# Patient Record
Sex: Male | Born: 1940
Health system: Southern US, Community
[De-identification: ages and names within clinical notes are randomized; demographics above are authoritative.]

## PROBLEM LIST (undated history)

## (undated) DIAGNOSIS — I272 Pulmonary hypertension, unspecified: Secondary | ICD-10-CM

## (undated) DIAGNOSIS — Z86718 Personal history of other venous thrombosis and embolism: Secondary | ICD-10-CM

## (undated) DIAGNOSIS — G8929 Other chronic pain: Secondary | ICD-10-CM

## (undated) DIAGNOSIS — R0609 Other forms of dyspnea: Secondary | ICD-10-CM

## (undated) DIAGNOSIS — M1990 Unspecified osteoarthritis, unspecified site: Secondary | ICD-10-CM

## (undated) DIAGNOSIS — Z8679 Personal history of other diseases of the circulatory system: Secondary | ICD-10-CM

## (undated) DIAGNOSIS — I452 Bifascicular block: Secondary | ICD-10-CM

## (undated) DIAGNOSIS — Z86711 Personal history of pulmonary embolism: Secondary | ICD-10-CM

## (undated) DIAGNOSIS — Z860101 Personal history of adenomatous and serrated colon polyps: Secondary | ICD-10-CM

## (undated) DIAGNOSIS — K2270 Barrett's esophagus without dysplasia: Secondary | ICD-10-CM

## (undated) DIAGNOSIS — K5790 Diverticulosis of intestine, part unspecified, without perforation or abscess without bleeding: Secondary | ICD-10-CM

## (undated) DIAGNOSIS — K219 Gastro-esophageal reflux disease without esophagitis: Secondary | ICD-10-CM

## (undated) DIAGNOSIS — Z7901 Long term (current) use of anticoagulants: Secondary | ICD-10-CM

## (undated) DIAGNOSIS — R600 Localized edema: Secondary | ICD-10-CM

## (undated) DIAGNOSIS — I10 Essential (primary) hypertension: Secondary | ICD-10-CM

## (undated) DIAGNOSIS — M545 Low back pain, unspecified: Secondary | ICD-10-CM

## (undated) DIAGNOSIS — I723 Aneurysm of iliac artery: Secondary | ICD-10-CM

## (undated) DIAGNOSIS — Z8371 Family history of colonic polyps: Secondary | ICD-10-CM

## (undated) DIAGNOSIS — M109 Gout, unspecified: Secondary | ICD-10-CM

## (undated) DIAGNOSIS — C61 Malignant neoplasm of prostate: Secondary | ICD-10-CM

## (undated) DIAGNOSIS — Z923 Personal history of irradiation: Secondary | ICD-10-CM

## (undated) DIAGNOSIS — R0600 Dyspnea, unspecified: Secondary | ICD-10-CM

## (undated) DIAGNOSIS — I2510 Atherosclerotic heart disease of native coronary artery without angina pectoris: Secondary | ICD-10-CM

## (undated) DIAGNOSIS — Z86010 Personal history of colonic polyps: Secondary | ICD-10-CM

## (undated) DIAGNOSIS — Z83719 Family history of colon polyps, unspecified: Secondary | ICD-10-CM

## (undated) DIAGNOSIS — I714 Abdominal aortic aneurysm, without rupture, unspecified: Secondary | ICD-10-CM

## (undated) DIAGNOSIS — Z8719 Personal history of other diseases of the digestive system: Secondary | ICD-10-CM

## (undated) DIAGNOSIS — N329 Bladder disorder, unspecified: Secondary | ICD-10-CM

## (undated) HISTORY — DX: Abdominal aortic aneurysm, without rupture: I71.4

## (undated) HISTORY — DX: Atherosclerotic heart disease of native coronary artery without angina pectoris: I25.10

## (undated) HISTORY — DX: Essential (primary) hypertension: I10

## (undated) HISTORY — PX: TONSILLECTOMY: SUR1361

## (undated) HISTORY — DX: Abdominal aortic aneurysm, without rupture, unspecified: I71.40

## (undated) HISTORY — PX: ORIF FEMUR FRACTURE: SHX2119

## (undated) HISTORY — DX: Diverticulosis of intestine, part unspecified, without perforation or abscess without bleeding: K57.90

## (undated) HISTORY — DX: Malignant neoplasm of prostate: C61

## (undated) HISTORY — DX: Personal history of irradiation: Z92.3

---

## 2003-08-12 ENCOUNTER — Inpatient Hospital Stay (HOSPITAL_COMMUNITY): Admission: EM | Admit: 2003-08-12 | Discharge: 2003-08-17 | Payer: Self-pay | Admitting: Emergency Medicine

## 2003-08-17 ENCOUNTER — Encounter: Payer: Self-pay | Admitting: Cardiology

## 2004-11-13 ENCOUNTER — Encounter (INDEPENDENT_AMBULATORY_CARE_PROVIDER_SITE_OTHER): Payer: Self-pay | Admitting: *Deleted

## 2004-11-13 ENCOUNTER — Ambulatory Visit (HOSPITAL_COMMUNITY): Admission: RE | Admit: 2004-11-13 | Discharge: 2004-11-13 | Payer: Self-pay | Admitting: *Deleted

## 2005-12-19 ENCOUNTER — Ambulatory Visit: Payer: Self-pay | Admitting: Internal Medicine

## 2006-01-06 ENCOUNTER — Ambulatory Visit: Payer: Self-pay | Admitting: Internal Medicine

## 2006-01-06 ENCOUNTER — Encounter (INDEPENDENT_AMBULATORY_CARE_PROVIDER_SITE_OTHER): Payer: Self-pay | Admitting: Specialist

## 2007-06-25 DIAGNOSIS — K2270 Barrett's esophagus without dysplasia: Secondary | ICD-10-CM

## 2007-06-25 HISTORY — DX: Barrett's esophagus without dysplasia: K22.70

## 2007-10-27 ENCOUNTER — Encounter: Payer: Self-pay | Admitting: Internal Medicine

## 2007-10-27 ENCOUNTER — Emergency Department (HOSPITAL_COMMUNITY): Admission: EM | Admit: 2007-10-27 | Discharge: 2007-10-27 | Payer: Self-pay | Admitting: Emergency Medicine

## 2007-10-30 ENCOUNTER — Ambulatory Visit: Payer: Self-pay | Admitting: Internal Medicine

## 2007-11-25 DIAGNOSIS — C189 Malignant neoplasm of colon, unspecified: Secondary | ICD-10-CM

## 2007-11-25 DIAGNOSIS — Z860100 Personal history of colon polyps, unspecified: Secondary | ICD-10-CM | POA: Insufficient documentation

## 2007-11-25 DIAGNOSIS — T18108A Unspecified foreign body in esophagus causing other injury, initial encounter: Secondary | ICD-10-CM

## 2007-11-25 DIAGNOSIS — M109 Gout, unspecified: Secondary | ICD-10-CM

## 2007-11-25 DIAGNOSIS — K2270 Barrett's esophagus without dysplasia: Secondary | ICD-10-CM

## 2007-11-25 DIAGNOSIS — K5730 Diverticulosis of large intestine without perforation or abscess without bleeding: Secondary | ICD-10-CM | POA: Insufficient documentation

## 2007-11-25 DIAGNOSIS — K2980 Duodenitis without bleeding: Secondary | ICD-10-CM | POA: Insufficient documentation

## 2007-11-25 DIAGNOSIS — R1013 Epigastric pain: Secondary | ICD-10-CM

## 2007-11-25 DIAGNOSIS — K222 Esophageal obstruction: Secondary | ICD-10-CM

## 2007-11-26 ENCOUNTER — Ambulatory Visit: Payer: Self-pay | Admitting: Internal Medicine

## 2007-11-26 DIAGNOSIS — K219 Gastro-esophageal reflux disease without esophagitis: Secondary | ICD-10-CM

## 2008-01-08 ENCOUNTER — Ambulatory Visit: Payer: Self-pay | Admitting: Internal Medicine

## 2008-01-08 ENCOUNTER — Encounter: Payer: Self-pay | Admitting: Internal Medicine

## 2008-01-11 ENCOUNTER — Encounter: Payer: Self-pay | Admitting: Internal Medicine

## 2008-05-15 ENCOUNTER — Observation Stay (HOSPITAL_COMMUNITY): Admission: EM | Admit: 2008-05-15 | Discharge: 2008-05-16 | Payer: Self-pay | Admitting: Emergency Medicine

## 2008-05-15 ENCOUNTER — Encounter (INDEPENDENT_AMBULATORY_CARE_PROVIDER_SITE_OTHER): Payer: Self-pay | Admitting: *Deleted

## 2008-05-16 ENCOUNTER — Ambulatory Visit: Payer: Self-pay | Admitting: Gastroenterology

## 2008-06-15 ENCOUNTER — Ambulatory Visit: Payer: Self-pay | Admitting: Internal Medicine

## 2009-01-15 ENCOUNTER — Ambulatory Visit (HOSPITAL_COMMUNITY): Admission: EM | Admit: 2009-01-15 | Discharge: 2009-01-15 | Payer: Self-pay | Admitting: Emergency Medicine

## 2009-01-15 ENCOUNTER — Ambulatory Visit: Payer: Self-pay | Admitting: Gastroenterology

## 2009-06-05 ENCOUNTER — Encounter: Payer: Self-pay | Admitting: Internal Medicine

## 2010-01-09 ENCOUNTER — Encounter: Payer: Self-pay | Admitting: Internal Medicine

## 2010-04-30 ENCOUNTER — Telehealth: Payer: Self-pay | Admitting: Internal Medicine

## 2010-04-30 ENCOUNTER — Encounter: Payer: Self-pay | Admitting: Internal Medicine

## 2010-05-04 ENCOUNTER — Encounter (INDEPENDENT_AMBULATORY_CARE_PROVIDER_SITE_OTHER): Payer: Self-pay

## 2010-05-08 ENCOUNTER — Ambulatory Visit: Payer: Self-pay | Admitting: Internal Medicine

## 2010-05-25 ENCOUNTER — Ambulatory Visit: Payer: Self-pay | Admitting: Internal Medicine

## 2010-05-29 ENCOUNTER — Encounter: Payer: Self-pay | Admitting: Internal Medicine

## 2010-06-26 ENCOUNTER — Encounter: Payer: Self-pay | Admitting: Internal Medicine

## 2010-07-24 NOTE — Letter (Signed)
Summary: Previsit letter  [facility]  [address] [place], [place] [address]   Phone: [phone]  Fax: [phone]       04/30/2010 MRN: [id number]  [facility] [address] [place], [place]  [address]  Dear [name],  Welcome to the Gastroenterology Division at [facility].    You are scheduled to see a nurse for your pre-procedure visit on 05/08/10 at 2:30 PM on the 3rd floor at [facility], [address]  We ask that you try to arrive at our office 15 minutes prior to your appointment time to allow for check-in.  Your nurse visit will consist of discussing your medical and surgical history, your immediate family medical history, and your medications.    Please bring a complete list of all your medications or, if you prefer, bring the medication bottles and we will list them.  We will need to be aware of both prescribed and over the counter drugs.  We will need to know exact dosage information as well.  If you are on blood thinners (Coumadin, Plavix, Aggrenox, Ticlid, etc.) please call our office today/prior to your appointment, as we need to consult with your physician about holding your medication.   Please be prepared to read and sign documents such as consent forms, a financial agreement, and acknowledgement forms.  If necessary, and with your consent, a friend or relative is welcome to sit-in on the nurse visit with you.  Please bring your insurance card so that we may make a copy of it.  If your insurance requires a referral to see a specialist, please bring your referral form from your primary care physician.  No co-pay is required for this nurse visit.     If you cannot keep your appointment, please call [phone] to cancel or reschedule prior to your appointment date.  This allows [place] the opportunity to schedule an appointment for another patient in need of care.    Thank you for choosing  Gastroenterology for your  medical needs.  We appreciate the opportunity to care for you.  Please visit [place] at our website  to learn more about our practice.                     Sincerely.                                                                                                                   The Gastroenterology Division

## 2010-07-24 NOTE — Letter (Signed)
Summary: EGD Instructions  [facility]  [address]   [place], [place] [address]   Phone: [phone]  Fax: [phone]       [name]    30-Mar-1941    MRN: [id number]       Procedure Day /Date: Friday, 05-25-10     Arrival Time: 2:30 p.m     Procedure Time: 3:30 p.m.     Location of Procedure:                    x  [facility] (4th Floor)    PREPARATION FOR ENDOSCOPY   On 05-25-10  THE DAY OF THE PROCEDURE:  1.   No solid foods, milk or milk products are allowed after midnight the night before your procedure.  2.   Do not drink anything colored red or purple.  Avoid juices with pulp.  No orange juice.  3.  You may drink clear liquids until 1:30 p.m., which is 2 hours before your procedure.                                                                                                CLEAR LIQUIDS INCLUDE: Water Jello Ice Popsicles Tea (sugar ok, no milk/cream) Powdered fruit flavored drinks Coffee (sugar ok, no milk/cream) Gatorade Juice: apple, white grape, white cranberry  Lemonade Clear bullion, consomm, broth Carbonated beverages (any kind) Strained chicken noodle soup Hard Candy   MEDICATION INSTRUCTIONS  Unless otherwise instructed, you should take regular prescription medications with a small sip of water as early as possible the morning of your procedure.        OTHER INSTRUCTIONS  You will need a responsible adult at least 71 years of age to accompany you and drive you home.   This person must remain in the waiting room during your procedure.  Wear loose fitting clothing that is easily removed.  Leave jewelry and other valuables at home.  However, you may wish to bring a book to read or an iPod/MP3 player to listen to music as you wait for your procedure to start.  Remove all body piercing jewelry and leave at home.  Total time from sign-in until discharge is approximately 2-3 hours.  You should go home  directly after your procedure and rest.  You can resume normal activities the day after your procedure.  The day of your procedure you should not:   Drive   Make legal decisions   Operate machinery   Drink alcohol   Return to work  You will receive specific instructions about eating, activities and medications before you leave.    The above instructions have been reviewed and explained to me by   Ulis Rias RN  May 08, 2010 3:18 PM     I fully understand and can verbalize these instructions _____________________________ Date _________

## 2010-07-24 NOTE — Miscellaneous (Signed)
Summary: Lec previsit  Clinical Lists Changes  Observations: Added new observation of NKA: T (05/08/2010 14:34)

## 2010-07-24 NOTE — Letter (Signed)
Summary: Patient Notice-[facility]rett[facility]  [address] [place], [place] [address]   Phone: [phone]  Fax: [phone]        May 29, 2010 MRN: [id number]    [facility] [address] [place], [place]  [address]    Dear Mr. Bontrager,  I am pleased to inform you that the biopsies taken during your recent endoscopic examination did not show any evidence of cancer upon pathologic examination.  However, your biopsies indicate you have a condition known as Barrett's esophagus. While not cancer, it is pre-cancerous (can progress to cancer) and needs to be monitored with repeat endoscopic examination and biopsies.  Fortunately, it is quite rare that this develops into cancer, but careful monitoring of the condition along with taking your medication as prescribed is important in reducing the risk of developing cancer.  It is my recommendation that you have a repeat upper gastrointestinal endoscopic examination in _3 years when You also will be due for colonoscopy.  Additional information/recommendations:  __Please call [phone] to schedule a return visit to further      evaluate your condition.  _x_Continue with treatment plan as outlined the day of your exam.  Please call us if you have or develop heartburn, reflux symptoms, any swallowing problems, or if you have questions about your condition that have not been fully answered at this time.  Sincerely,  Hart Carwin MD  This letter has been electronically signed by your physician.  Appended Document: Patient Notice-Barrett's Esopghagus Letter mailed

## 2010-07-24 NOTE — Miscellaneous (Signed)
Summary: Omeprazole Rx  Clinical Lists Changes  Medications: Changed medication from OMEPRAZOLE 20 MG CPDR (OMEPRAZOLE) 1 by mouth once daily. to OMEPRAZOLE 20 MG CPDR (OMEPRAZOLE) Take 1 tablet by mouth once a day.   MUST HAVE OFFICE VISIT FOR FURTHER REFILLS! - Signed Rx of OMEPRAZOLE 20 MG CPDR (OMEPRAZOLE) Take 1 tablet by mouth once a day.   MUST HAVE OFFICE VISIT FOR FURTHER REFILLS!;  #90 x 0;  Signed;  Entered by: Lamona Curl CMA (AAMA);  Authorized by: Hart Carwin MD;  Method used: Faxed to [facility], [address], [place], [place]  [id number], Ph: [phone], Fax: [phone]    Prescriptions: OMEPRAZOLE 20 MG CPDR (OMEPRAZOLE) Take 1 tablet by mouth once a day.   MUST HAVE OFFICE VISIT FOR FURTHER REFILLS!  #90 x 0   Entered by:   Lamona Curl CMA (AAMA)   Authorized by:   Hart Carwin MD   Signed by:   Lamona Curl CMA (AAMA) on 01/09/2010   Method used:   Faxed to ...       [facility] (mail-order)       [address]       [place], [place]  [id number]       Ph: [phone]       Fax: [phone]   RxID:   [id number]

## 2010-07-24 NOTE — Procedures (Signed)
Summary: Upper Endoscopy  Patient: [name] Note: All result statuses are Final unless otherwise noted.  Tests: (1) Upper Endoscopy (EGD)   EGD Upper Endoscopy       DONE     [facility]     [address]     [place], [place]  [address]           ENDOSCOPY PROCEDURE REPORT           PATIENT:  [name], [name]  MR#:  [id number]     BIRTHDATE:  11-Oct-1940, 69 yrs. old  GENDER:  male           ENDOSCOPIST:  Hedwig Morton. Juanda Chance, MD     Referred by:  Soyla Murphy. Renne Crigler, M.D.           PROCEDURE DATE:  05/25/2010     PROCEDURE:  EGD with biopsy, [address]     ASA CLASS:  Class II     INDICATIONS:  h/o Barrett's Esophagus last EGD 12/2007, hx of fish     bone impaction 2009,           MEDICATIONS:   Versed 10 mg, Fentanyl 100 mcg     TOPICAL ANESTHETIC:  Exactacain Spray           DESCRIPTION OF PROCEDURE:   After the risks benefits and     alternatives of the procedure were thoroughly explained, informed     consent was obtained.  The LB GIF-H180 [id number] endoscope was     introduced through the mouth and advanced to the second portion of     the duodenum, without limitations.  The instrument was slowly     withdrawn as the mucosa was fully examined.     <<PROCEDUREIMAGES>>           Barrett's esophagus was found. 2-3 cm Barrett's esophagus 35-37 cm     Multiple biopsies were obtained and sent to pathology (see     image6).  A hiatal hernia was found (see image5, image4, and     image2). 3 cm hiatal hernia  Otherwise the examination was normal.     Retroflexed views revealed no abnormalities.    The scope was then     withdrawn from the patient and the procedure completed.           COMPLICATIONS:  None           ENDOSCOPIC IMPRESSION:     1) Barrett's esophagus     2) Hiatal hernia     3) Otherwise normal examination     RECOMMENDATIONS:     1) Await biopsy results     2) Anti-reflux regimen to be follow     continue Omeprazole 20 mg daily           REPEAT EXAM:  In 2  year(s) for.           ______________________________     Hedwig Morton. Juanda Chance, MD           CC:           n.     eSIGNED:   Hedwig Morton. Brodie at 05/25/2010 04:45 PM           [name], [id number]  Note: An exclamation mark (!) indicates a result that was not dispersed into the flowsheet. Document Creation Date: 05/25/2010 4:45 PM _______________________________________________________________________  (1) Order result status: Final Collection or observation date-time: 05/25/2010 16:30 Requested date-time:  Receipt date-time:  Reported  date-time:  Referring Physician:   Ordering Physician: Lina Sar [phone]) Specimen Source:  Source: Launa Grill Order Number: [id number] Lab site:   Appended Document: Upper Endoscopy 3 yrs     Procedures Next Due Date:    EGD: 05/2013

## 2010-07-24 NOTE — Progress Notes (Signed)
Summary: EGD?  Phone Note Call from Patient Call back at Home Phone [phone]   Caller: wife Call For: Dr. Juanda Chance Reason for Call: Talk to Nurse Summary of Call: wife thinks pt is due for another EGD now but nothing indicates this in EMR or IDX... no problems per wife Initial call taken by: Vallarie Mare,  April 30, 2010 9:39 AM  Follow-up for Phone Call        Patient's wife called to see if patient needs EGD scheduled. Patient had EGD 12/2007 with a note to have another in 2 years. Patient had EGD on 01/15/09 to have foreign body removed. Please, advise. Follow-up by: Jesse Fall RN,  April 30, 2010 10:56 AM  Additional Follow-up for Phone Call Additional follow up Details #1::        Yes. He is a Barrett's esophagus pt. He is due for EGD/Bx fot Barrett's follow up. Additional Follow-up by: Hart Carwin MD,  April 30, 2010 3:43 PM    Additional Follow-up for Phone Call Additional follow up Details #2::    Patient is scheduled for EGD for barrett's esophagus with bx on 05/15/10 @ 2 PM with Dr. Juanda Chance at [facility]. Patient is scheduled for previsit on 05/08/10 @ 2:30 PM. Patient's wife also wants to get Omeprazole rx sent to [facility] at [phone]. Must send his birthdate and [place] # [id number] per wife.  Prescriptions: OMEPRAZOLE 20 MG CPDR (OMEPRAZOLE) Take 1 tablet by mouth once a day.   MUST HAVE OFFICE VISIT FOR FURTHER REFILLS!  #90 x 0   Entered by:   Jesse Fall RN   Authorized by:   Hart Carwin MD   Signed by:   Jesse Fall RN on 04/30/2010   Method used:   Printed then faxed to ...       [facility] (mail-order)       [address]       [place], [place]  [id number]       Ph: [phone]       Fax: [phone]   RxID:   [id number]

## 2010-07-26 NOTE — Miscellaneous (Signed)
Summary: Omeprazole refill  Clinical Lists Changes  Medications: Changed medication from OMEPRAZOLE 20 MG CPDR (OMEPRAZOLE) Take 1 tablet by mouth once a day.   MUST HAVE OFFICE VISIT FOR FURTHER REFILLS! to OMEPRAZOLE 20 MG CPDR (OMEPRAZOLE) Take 1 tablet by mouth once a day. - Signed Rx of OMEPRAZOLE 20 MG CPDR (OMEPRAZOLE) Take 1 tablet by mouth once a day.;  #90 x 3;  Signed;  Entered by: Christie Nottingham CMA (AAMA);  Authorized by: Hart Carwin MD;  Method used: Faxed to [facility], [address], [place], [place]  [id number], Ph: [phone], Fax: [phone]    Prescriptions: OMEPRAZOLE 20 MG CPDR (OMEPRAZOLE) Take 1 tablet by mouth once a day.  #90 x 3   Entered by:   Christie Nottingham CMA (AAMA)   Authorized by:   Hart Carwin MD   Signed by:   Christie Nottingham CMA (AAMA) on 06/26/2010   Method used:   Faxed to ...       [facility] (mail-order)       [address]       [place], [place]  [id number]       Ph: [phone]       Fax: [phone]   RxID:   [id number]

## 2010-10-02 ENCOUNTER — Emergency Department (HOSPITAL_COMMUNITY): Payer: Medicare HMO

## 2010-10-02 ENCOUNTER — Encounter (HOSPITAL_COMMUNITY): Payer: Self-pay

## 2010-10-02 ENCOUNTER — Inpatient Hospital Stay (HOSPITAL_COMMUNITY)
Admission: EM | Admit: 2010-10-02 | Discharge: 2010-10-09 | DRG: 482 | Disposition: A | Discharge status: Skilled Nursing Facility | Payer: Medicare HMO | Attending: Orthopedic Surgery | Admitting: Orthopedic Surgery

## 2010-10-02 DIAGNOSIS — S0003XA Contusion of scalp, initial encounter: Secondary | ICD-10-CM | POA: Diagnosis present

## 2010-10-02 DIAGNOSIS — Y9289 Other specified places as the place of occurrence of the external cause: Secondary | ICD-10-CM

## 2010-10-02 DIAGNOSIS — K5730 Diverticulosis of large intestine without perforation or abscess without bleeding: Secondary | ICD-10-CM | POA: Diagnosis present

## 2010-10-02 DIAGNOSIS — Z86711 Personal history of pulmonary embolism: Secondary | ICD-10-CM

## 2010-10-02 DIAGNOSIS — K2270 Barrett's esophagus without dysplasia: Secondary | ICD-10-CM | POA: Diagnosis present

## 2010-10-02 DIAGNOSIS — S1093XA Contusion of unspecified part of neck, initial encounter: Secondary | ICD-10-CM | POA: Diagnosis present

## 2010-10-02 DIAGNOSIS — S7223XA Displaced subtrochanteric fracture of unspecified femur, initial encounter for closed fracture: Principal | ICD-10-CM | POA: Diagnosis present

## 2010-10-02 DIAGNOSIS — IMO0002 Reserved for concepts with insufficient information to code with codable children: Secondary | ICD-10-CM | POA: Diagnosis present

## 2010-10-02 LAB — BASIC METABOLIC PANEL
BUN: 22 mg/dL (ref 6–23)
CO2: 25 mEq/L (ref 19–32)
Calcium: 8.6 mg/dL (ref 8.4–10.5)
Creatinine, Ser: 0.91 mg/dL (ref 0.4–1.5)
Glucose, Bld: 141 mg/dL — ABNORMAL HIGH (ref 70–99)

## 2010-10-02 LAB — CBC
HCT: 41.3 % (ref 39.0–52.0)
Platelets: 138 10*3/uL — ABNORMAL LOW (ref 150–400)
RDW: 13 % (ref 11.5–15.5)
WBC: 9.5 10*3/uL (ref 4.0–10.5)

## 2010-10-02 LAB — DIFFERENTIAL
Basophils Absolute: 0 10*3/uL (ref 0.0–0.1)
Eosinophils Relative: 1 % (ref 0–5)
Lymphocytes Relative: 7 % — ABNORMAL LOW (ref 12–46)
Neutrophils Relative %: 86 % — ABNORMAL HIGH (ref 43–77)

## 2010-10-02 LAB — URINALYSIS, ROUTINE W REFLEX MICROSCOPIC
Bilirubin Urine: NEGATIVE
Hgb urine dipstick: NEGATIVE
Protein, ur: NEGATIVE mg/dL
Urobilinogen, UA: 1 mg/dL (ref 0.0–1.0)

## 2010-10-03 LAB — BASIC METABOLIC PANEL
BUN: 17 mg/dL (ref 6–23)
CO2: 26 mEq/L (ref 19–32)
Chloride: 106 mEq/L (ref 96–112)
Creatinine, Ser: 0.75 mg/dL (ref 0.4–1.5)
Glucose, Bld: 130 mg/dL — ABNORMAL HIGH (ref 70–99)
Potassium: 4 mEq/L (ref 3.5–5.1)

## 2010-10-03 LAB — CBC
HCT: 30.5 % — ABNORMAL LOW (ref 39.0–52.0)
MCH: 32.1 pg (ref 26.0–34.0)
MCHC: 34.1 g/dL (ref 30.0–36.0)
MCV: 94.1 fL (ref 78.0–100.0)
Platelets: 133 10*3/uL — ABNORMAL LOW (ref 150–400)
RDW: 13.1 % (ref 11.5–15.5)

## 2010-10-04 LAB — BASIC METABOLIC PANEL
CO2: 27 mEq/L (ref 19–32)
Calcium: 7.1 mg/dL — ABNORMAL LOW (ref 8.4–10.5)
Creatinine, Ser: 0.81 mg/dL (ref 0.4–1.5)
GFR calc Af Amer: >60 mL/min (ref >60)
GFR calc non Af Amer: >60 mL/min (ref >60)
Sodium: 137 mEq/L (ref 135–145)

## 2010-10-04 LAB — CBC
Hemoglobin: 8.7 g/dL — ABNORMAL LOW (ref 13.0–17.0)
MCH: 32.5 pg (ref 26.0–34.0)
MCHC: 34.4 g/dL (ref 30.0–36.0)
Platelets: 103 10*3/uL — ABNORMAL LOW (ref 150–400)
RDW: 13.2 % (ref 11.5–15.5)
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
## 2010-10-06 LAB — TYPE AND SCREEN
Antibody Screen: NEGATIVE
Unit division: 0

## 2010-10-09 NOTE — Discharge Summary (Signed)
  NAMEOTT, ZIMMERLE              ACCOUNT NO.:  [id number]  MEDICAL RECORD NO.:  [id number]           PATIENT TYPE:  I  LOCATION:  1610                         FACILITY:  [facility]  PHYSICIAN:  Madlyn Frankel. Charlann Boxer, M.D.  DATE OF BIRTH:  [birth year]  DATE OF ADMISSION:  10/02/2010 DATE OF DISCHARGE:  10/08/2010                        DISCHARGE SUMMARY - REFERRING   ADDENDUM:  Mr. [name] was seen and evaluated and admitted to the hospital on October 02, 2010, and underwent an open reduction and internal fixation of the comminuted left subtrochanteric femur fracture.  He had done well.  Initially planned to go to a skilled nursing facility due to restriction in his mobility on Friday.  However due to lack of bed availability, he remained in the hospital over the weekend.  His hospital course over the weekend was uncomplicated with no events medically or orthopedically.  His wound was noted to have some minor serous drainage, for which daily dressing changes were applied.  At the time of discharge, he is stable with no postoperative complications or concerns.  Discharge medications already dictated up.  However, we will have the patient on Keflex 500 mg p.o. q.6h. to be taken for 14 days, however may be stopped if his wound is dry for 3 days consecutively.  Other instructions would be wound management.  The patient will have daily dressing changes to wound to twice a day as needed for any significant serous oozing.  To review also if there are any questions orthopedically and to arrange for followup in 2 weeks to see if they can contact Dr. Durene Romans at [facility] [phone].     Madlyn Frankel Charlann Boxer, M.D.     MDO/MEDQ  D:  10/08/2010  T:  10/08/2010  Job:  [id number]  Electronically Signed by Durene Romans M.D. on 10/09/2010 07:44:26 AM
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
## 2010-11-06 NOTE — Consult Note (Signed)
NAMEJAJA, SWITALSKI NO.:  [id number]   MEDICAL RECORD NO.:  [id number]          PATIENT TYPE:  OBV   LOCATION:  5123                         FACILITY:  [facility]   PHYSICIAN:  Hedwig Morton. Juanda Chance, MD     DATE OF BIRTH:  [birth year]   DATE OF CONSULTATION:  05/15/2008  DATE OF DISCHARGE:                                 CONSULTATION   REASON FOR CONSULTATION:  Food impaction.   HISTORY OF PRESENT ILLNESS:  This is a 70 year old gentleman with a past  medical history of Barrett esophagus, gout, and multiple food impactions  in the past who presents to the emergency room with complaints of  dysphagia.  The patient was eating yesterday and he feels that he had a  dill pickle that is stuck in his throat.  The patient denied eating  quickly at that time.  He is unable to tolerate any further solids or  liquids.  Although he was able to manage his salivary secretions in the  past, the patient has had multiple events  where he has had dysphagia,  although those have passed spontaneously.  The patient has undergone an  EGD by Dr. Jarold Motto in May 2009 for a fish bone impaction and which was  removed and subsequently, he underwent a repeat EGD by Dr. Juanda Chance in  July.  The repeat EGD was for followup of Barrett esophagus that was  noticed during the initial endoscopy.  The patient denies any overt  symptoms of reflux disease, although he is on Prilosec at this time.   PAST MEDICAL HISTORY AND PAST SURGICAL HISTORY:  As stated above.   FAMILY HISTORY:  Noncontributory.   SOCIAL HISTORY:  No tobacco or illicit drug use, occasional alcohol.   ALLERGIES:  No known drug allergies.   MEDICATIONS:  1. Omeprazole 20 mg p.o. daily.  2. Aspirin 81 mg p.o. daily.  3. Allopurinol, dosage unknown at this time.   REVIEW OF SYSTEMS:  As stated above in history of present illness.   PHYSICAL EXAMINATION:  VITAL SIGNS:  Blood pressure is 159/89, heart  rate is 79, respirations 12, and  pulse ox is 99% on 2 L oxygen in  preparation of further EGD.  GENERAL:  The patient is no acute distress, alert and oriented.  HEENT:  Normocephalic and atraumatic.  Extraocular muscles intact.  NECK:  Supple.  No lymphadenopathy.  LUNGS:  Clear to auscultation bilaterally.  CARDIOVASCULAR:  Regular rate and rhythm.  ABDOMEN:  Soft, nontender, and nondistended.  Positive bowel sounds.  EXTREMITIES:  No clubbing, cyanosis, or edema.   LABORATORY VALUES:  None at this time.   IMPRESSION:  1. Acute food impaction.  2. History of Barrett esophagus.   PLAN:  At this time is to perform EGD to extract the food bolus and  further recommendations pending the findings.      Jordan Hawks Elnoria Howard, MD   Electronically Signed     ______________________________  Hedwig Morton. Juanda Chance, MD    PDH/MEDQ  D:  05/15/2008  T:  05/16/2008  Job:  [id number]   cc:   Verlee Monte  M. Juanda Chance, MD

## 2010-11-06 NOTE — Op Note (Signed)
NAMESEIF, TEICHERT NO.:  [id number]   MEDICAL RECORD NO.:  [id number]          PATIENT TYPE:  OBV   LOCATION:  5123                         FACILITY:  [facility]   PHYSICIAN:  Zola Button T. Lazarus Salines, M.D. DATE OF BIRTH:  04/27/1941   DATE OF PROCEDURE:  05/15/2008  DATE OF DISCHARGE:                               OPERATIVE REPORT   PREOPERATIVE DIAGNOSIS:  Cervical esophageal food impaction.   POSTOPERATIVE DIAGNOSIS:  Cervical esophageal food impaction.   PROCEDURE PERFORMED:  Rigid esophagoscopy with extraction of food  impaction.   SURGEON:  Gloris Manchester. Lazarus Salines, MD   ASSISTANT:  Jordan Hawks. Elnoria Howard, MD   ANESTHESIA:  General orotracheal.   BLOOD LOSS:  None.   COMPLICATIONS:  None.   FINDINGS:  A pale fibrous odorous apparent food impaction 5 cm below the  arytenoids and just below the cricopharyngeus.  Partially dislodged and  removed, and the remainder advanced into the stomach.  Dilated to #36-  [place] without difficulty using [facility] dilators.  No obvious lesions.   PROCEDURE:  In the operating room under general orotracheal anesthesia,  Dr. Elnoria Howard attempted to further disimpact the food using a flexible  esophagoscope.  He was unable to pass the pediatric esophagoscope pass  the lesion.  The full size adult esophagoscope was difficult to maneuver  given the proximity of the lesion.  ENT was called for assistance and  evacuating the apparent food impaction.   A rubber tooth guard was placed.  The short cervical esophagoscope was  lubricated and introduced and passed into the pharynx.  Small amount of  secretion was evacuated.  Advancing this, passed the larynx, the  cricopharyngeus was encountered and readily traversed.  Just deep to  this was whitish material which was consistent with the food impaction.  Using an alligator forceps, several large pieces of this were extracted,  but the entire mass itself would not come up.  After debulking it to the  point  where it seemed to be slightly mobile, a 10-French Foley catheter  was passed down the esophagoscope and passed the impaction itself.  The  balloon was inflated with a 5 mL saline and extracting the Foley, and  the esophagoscope at the same time gently, finally the Foley was under  traction and snapped loose, but the food impaction did not clear.  This  was not repeated.   The Weerda esophagoscope was passed into the hypopharynx and expanded  attempting to visualize the food impaction through the cricopharyngeus.  It was not sufficiently long and was removed.   The full-length esophagoscope was at this point lubricated and  introduced.  The food impaction at this point was readily mobilized  distally with no residual debris and no obvious lesions.  The full-  length esophagoscope was passed to its full length and food foreign body  was gone.  The esophagoscope was withdrawn slowly and no specific  lesions were identified especially including strictures.  The  esophagoscope was removed from the field.   Attempt was made to pass a 32-French Maloney dilator with difficulty.  The short esophagoscope was reintroduced  and down its lumen, a 26-French  Maloney dilator was easily passed to its full length.  This was removed.  The esophagoscope was removed.  Following this, a 28-French, 32-French,  and finally 36-French Maloney dilator were all passed easily to their  full length and removed.  There was no evident blood.  At this point in  consultation with Dr. Elnoria Howard, the procedure was completed.  The rubber  tooth guard was removed.  There was slight bruising of the upper gums in  the gap between his residual teeth.  The patient was returned to  Anesthesia, awakened, extubated, and transferred to recovery in stable  condition.   COMMENT:  A 70 year old white male with a recent history of several  episodes of dysphagia and food impaction and recently discovered to have  a Barrett esophagus by  Dr. Jarold Motto and Dr. Lina Sar today presented  with a food impaction from last night's dinner.  Dr. Elnoria Howard was unable to  successfully clear this owing to its density, and its very proximal  location, hence the indication for my assistance.  I discussed his  situation with his wife.  He does wear upper and lower partial plates  and if he is simply wolfing his food, he may be getting large pieces  which do not pass freely.  I think a barium swallow to look for  esophageal motility will be an order, and then perhaps a repeat flexible  full length esophagoscopy to assess for distal lesions.      Gloris Manchester. Lazarus Salines, M.D.  Electronically Signed     KTW/MEDQ  D:  05/15/2008  T:  05/15/2008  Job:  [id number]   cc:   Jordan Hawks. Elnoria Howard, MD  Vania Rea. Jarold Motto, MD, [place], FACP, FAGA  Hedwig Morton. Juanda Chance, MD

## 2010-11-09 NOTE — H&P (Signed)
[name], WATFORD NO.:  [id number]   MEDICAL RECORD NO.:  [id number]                   PATIENT TYPE:  INP   LOCATION:  0154                                 [facility]ACILITY[facility]  [facility]   PHYSICIAN:  Burnice Logan, M.D.               DATE OF BIRTH:  [birth year]   DATE OF ADMISSION:  08/12/2003  DATE OF DISCHARGE:                                HISTORY & PHYSICAL   CHIEF COMPLAINT:  Shortness of breath.   HISTORY OF PRESENT ILLNESS:  The patient is a 70 year old Caucasian  gentleman who had the sudden onset of shortness of breath and gasping on  Monday.  He had returned from home and struggled to take off his tie and  complained of being acutely short of breath.  He did not have any chest  pains with this.  He had some cough that was largely nonproductive.  He had  had a cold about 2-1/2 weeks preceding his current symptoms, and has not  felt very well since then.  He went to see Dr. Ricki Miller in his office, in the  absence of Dr. Renne Crigler.  He was started on a Z-Pak, Humibid, and albuterol  inhaler.  He continued to be out of breath with very minimal exertion, such  as walking from the bedroom to the hall.  He was also noted by his wife to  have a very rapid heart rate.  This led to the suspicion that the patient  may be harboring a pulmonary embolus.  He was thus sent by Dr. Ricki Miller to the  emergency room to be evaluated.  He had a CT scan which was read as massive  pulmonary embolism by the radiologist.  I was asked to admit him.   PAST MEDICAL HISTORY:  None, except for gouty arthritis.  He denies any  history of hypertension, coronary disease, hyperlipidemia, diabetes,  strokes, or other medical illness.  He does not recall any previous  surgeries.   ALLERGIES:  No known drug allergies.   MEDICATIONS:  1. Baby aspirin daily.  2. Allopurinol.   FAMILY HISTORY:  None for blood clots.  His father died of aortic aneurysm  in his late 71's.  His mom is alive at 33  and in good health.  He does not  know of any family history of cancer.   SOCIAL HISTORY:  The patient is married.  He works as a [employer] manager.  He does not smoke.  He drinks an occasional liquor or beer.  He spends a lot  of time behind the computer, but occasionally he plays golf.   REVIEW OF SYSTEMS:  Negative for fever, chills.  NEUROLOGIC:  He denies any  headaches.  CARDIORESPIRATORY:  Currently feels fine, comfortable.  No  shortness of breath at rest.  No hemoptysis.  No cough or sputum.  GASTROINTESTINAL:  Experienced some diarrhea after he started taking  antibiotics.  No  abdominal pain.  GENITOURINARY:  No dysuria or hematuria.  MUSCULOSKELETAL:  Negative for lower extremity edema or pain.   PHYSICAL EXAMINATION:  GENERAL:  This is a middle-aged Caucasian gentleman  who is obese, laying in supine in bed.  He does not appear uncomfortable.  He is saturating at 94% on room air with no use of accessory muscles, no  cyanosis.  VITAL SIGNS:  Temperature is 98.1, heart rate 109, blood pressure 135/90,  respiratory rate 20 per minute.  HEENT:  Normocephalic and atraumatic.  Pupils round and symmetric.  No  facial asymmetry.  Normal extraocular movements.  Mucous membranes are  moist.  NECK:  Supple.  No masses felt.  LUNGS:  Adequate bilateral air movement.  Clear to auscultation.  No added  sounds.  CARDIOVASCULAR:  Heart sounds 1 and 2 heard, regular.  No murmurs.  ABDOMEN:  Obese, soft, nontender.  Normal bowel sounds.  No organomegaly.  CNS:  He is alert and oriented x3.  Cranial nerves II-XII grossly normal.  He does not have any focal neurological deficits.  EXTREMITIES:  Dorsalis pedis, as well as popliteal pulses, are palpable.  No  pedal edema.  No calf tenderness.  Homans sign is negative.   LABORATORY DATA:  CT scan per radiologist's report shows massive pulmonary  embolus with questionable tiny amount of thrombus in the right popliteal  vein.  EKG revealed  sinus tachycardia at a rate of 104 beats per minute with  left axis deviation and nonspecific T wave abnormality.  ABG on room air  revealed pH of 7.452, PCO2 29, PO2 58, saturations 91, bicarb 20.  Glucose  is 109, sodium 135, potassium 4.2, chloride 107, CO2 22, BUN 19, creatinine  1.2.  CK 68.  Troponin is 0.01.  BNP is 398.   ASSESSMENT AND PLAN:  Acute massive pulmonary embolus.  The patient remains  clinically stable, in spite of his massive pulmonary embolus.  His ABG  indicates an AA gradient with PO2 of only 58% on room air.  He has been  started on IV heparin down in the emergency room.  Will continue  anticoagulation, and start Coumadin.  His goal INR will be between 2 and 3  while on anticoagulation.  Will admit him to a monitored bed tonight while  we start anticoagulation.  Will be consulting pulmonary and critical care  for their input.  Will go ahead and order bilateral lower extremity Dopplers  to exclude deep vein thrombosis.  If these turn out to be negative, then  hematology oncology consultation for pro coagulant work-up may be necessary.  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  Currently, he  remains stable.  He has been educated on the diagnosis, and reassured about  his current stable status.                                               Burnice Logan, M.D.    ES/MEDQ  D:  08/12/2003  T:  08/12/2003  Job:  [id number]   cc:   Soyla Murphy. Renne Crigler, M.D.  [address]  [place]  [place] [address]  Fax: [phone]

## 2010-11-09 NOTE — Discharge Summary (Signed)
NAMESTEVIN, [name] NO.:  [id number]   MEDICAL RECORD NO.:  [id number]                   PATIENT TYPE:  INP   LOCATION:  0366                                 FACILITY:  [facility]   PHYSICIAN:  Hettie Holstein, D.O.                 DATE OF BIRTH:  1940-10-23   DATE OF ADMISSION:  08/12/2003  DATE OF DISCHARGE:  08/17/2003                                 DISCHARGE SUMMARY   PRIMARY CARE PHYSICIAN:  [name], M.D.   ADMISSION DIAGNOSES:  1. Shortness of breath.  2. Acute pulmonary embolus.   DISCHARGE DIAGNOSES:  1. Shortness of breath.  2. Acute pulmonary embolus.   PROCEDURES:  1. Acute massive pulmonary embolus on CT scan without evidence of deep     venous thrombosis by lower extremity Doppler.  2. Echocardiogram performed with preserved left ventricular function with     ejection fraction of 65% and some mild dilation and mild systolic     dysfunction of the right ventricle and pulmonary artery pressure of 45.  3. He underwent hypercoagulable workup.   These findings were discussed with Dr. Truett Berry and it was recommended that  he undergo repeat testing as an outpatient and have followup with  hematologist as an outpatient.  This is to be directed and coordinated by  his primary care physician.   DISCHARGE MEDICATIONS:  1. Coumadin 12.5 mg p.o. every night with plans to recheck his PT and INR on     Friday, to be followed by [name] at [facility]  2. Lovenox 120 mg subcutaneously q.12h.  3. Allopurinol 100 mg p.o. daily.  4. Recommended he be off aspirin while on Coumadin.   ACTIVITY:  The patient should not return to work until seen by his primary  care physician.   DISPOSITION:  The patient is being discharged to home.  No oxygen required.  He is hemodynamically stable and comfortable, completely asymptomatic.   FOLLOW UP:  He is asked to call [name] for an appointment in a week as  today is Wednesday and I  cannot reach his clinic to schedule this for him.  He was given a prescription for Coumadin and Lovenox coverage.  He is also  scheduled to have PT/INR checked on Friday.   HISTORY OF PRESENT ILLNESS:  This is a relatively healthy 70 year old  Caucasian male who developed some shortness of breath and gasping on Monday.  He had returned home and struggled to take off his tie complaining of being  acutely short of breath.  He did not have any chest pain with this.  He had  some cough that was largely nonproductive.  He had a cold about 2-1/2 weeks  ago, proceeding to his current symptoms, and has not felt very well since  then.  He went to see his primary physician, Dr. Ricki Berry, in his office in the  absence  of [name].  He was started on a Z bag, given an albuterol  inhaler.  He continued to be out of breath on very minimal exertion as he  was talking from the bedroom to the hall.  He was also noted by his wife to  have very rapid heart rate.  This lead to the suspicion that the patient may  be having pulmonary embolus.  He was thus sent by his primary care physician  to the emergency room.   HOSPITAL COURSE:  On CT scan he was found to have an acute massive pulmonary  embolus.  He was started on heparin and Coumadin.  His course was  uneventful.  He underwent hypercoagulable workup.  However, these laboratory  studies were drawn following initiation of Coumadin and heparin actually the  following day.  His hospital course was without event, not requiring oxygen.  During his course, he underwent lower extremity Dopplers as well as 2-D  echocardiogram and the findings were as noted above. Please review.  I refer  to the list above.   On discharge his labs are as follows:  His PT/INR have risen to 1.6 after  being on Coumadin 12.5 mg p.o. at night.  The pharmacy adjusted his Lovenox  as well as heparin dose.  At this time we plan on sending him home with  Lovenox and Coumadin.  Followup is  to be directed by his primary care  physician.  He is instructed to call.                                               Hettie Holstein, D.O.    ESS/MEDQ  D:  08/17/2003  T:  08/17/2003  Job:  [id number]   cc:   [name], M.D.  [address]  [place]  [place] [address]  Fax: [phone]

## 2010-11-09 NOTE — Op Note (Signed)
NAMEKEYLOR, RANDS NO.:  [id number]   MEDICAL RECORD NO.:  [id number]          PATIENT TYPE:  AMB   LOCATION:  ENDO                         FACILITY:  [facility]   PHYSICIAN:  Georgiana Spinner, M.D.    DATE OF BIRTH:  02-03-1941   DATE OF PROCEDURE:  11/13/2004  DATE OF DISCHARGE:                                 OPERATIVE REPORT   PROCEDURE:  Colonoscopy with polypectomy and biopsy.  This was a prolonged  procedure.   ANESTHESIA:  Demerol 100 milligrams, Versed 10 milligrams.   PROCEDURE:  With the patient mildly sedated in the left lateral decubitus  position, the Olympus videoscopic colonoscope was inserted in the rectum  after normal rectal exam and passed under direct vision through a  diverticular filled sigmoid colon.  We reached the cecum.  The  cecum was  identified by base of cecum and ileocecal valve, both of which were  photographed.  From this point colonoscope was slowly withdrawn taking  circumferential views of colonic mucosa as we withdrew all the way to the  rectum, stopping first in the ascending colon just a few folds removed from  the ileocecal valve at which point a flat polyp, probably approximately 1 cm  in size was seen, photographed and first I biopsied it to obtain tissue and  then removed the remainder using snare cautery technique setting of 20/200  blended current.  Tissue was retrieved for pathology. We next stopped in the  sigmoid colon at approximately 40 cm from anal verge at which point a very  large polyp on a very wide, thick stalk was noted. We were able to  photograph this.  This polyp occluded the lumen, but we were able to get  snare around the stalk and slowly with using snare cautery technique, again  with same setting of 20/200, we were able to remove this polyp using a Artist.  I withdrew the polyp and retrieved it.  The endoscope was  then reinserted to this spot and for prophylactic reasons I attempted  to  deploy an Endoloop around the polyp stalk and we were able to get the loops  around the stalk, but could never get them to deploy. So I injected 4 cc  epinephrine around the circumference of the polyp stalk.  We next stopped  just distal to this where another large polyp was seen and it too was  removed using snare cautery technique. The polyp head was certainly larger  than the previous polyp but the stalk was more manageable and we removed  this polyp using the same snare cautery technique same setting.  I did not  feel that any further work on the polyp stalk was necessary.  This polyp too  was retrieved using a International aid/development worker.  We then inserted the endoscope  to this level and withdrew all the way to the rectum which appeared normal  on direct and showed two smaller polyps on retroflexed view. They were  photographed and removed using hot biopsy forceps technique again the same  setting of 20/200 blended current. The  endoscope was straightened,  withdrawn. The patient's vital signs, pulse oximeter remained stable. The  patient tolerated procedure well without apparent complications.   FINDINGS:  Diverticulosis of sigmoid colon and lesser so in the right colon.  Polyp of descending colon removed.  Two very large polyps of sigmoid colon  area approximately 40 cm from anal verge, both on stalks, both removed using  snare cautery technique were removed and retrieved and two small polyps in  the rectum were also were removed on retroflexed view.   PLAN:  Await biopsy report. The patient will call me for results and fol0was  slowly 1/2 degree of await and will absolutely have to avoid NSAID therapy  for at least two weeks.      GMO/MEDQ  D:  11/13/2004  T:  11/13/2004  Job:  [id number]

## 2011-01-08 ENCOUNTER — Encounter: Payer: Self-pay | Admitting: Internal Medicine

## 2011-03-27 LAB — CULTURE, BLOOD (ROUTINE X 2)
Culture: NO GROWTH
Culture: NO GROWTH

## 2011-03-27 LAB — CBC
HCT: 42.1
Hemoglobin: 14
MCHC: 34.3
MCV: 96.8
Platelets: 146 — ABNORMAL LOW
RDW: 13.2
WBC: 13.4 — ABNORMAL HIGH

## 2011-03-27 LAB — URINALYSIS, ROUTINE W REFLEX MICROSCOPIC
Glucose, UA: NEGATIVE
Hgb urine dipstick: NEGATIVE
Ketones, ur: NEGATIVE
pH: 6.5

## 2011-03-27 LAB — COMPREHENSIVE METABOLIC PANEL
ALT: 9
AST: 17
Albumin: 3 — ABNORMAL LOW
Calcium: 7.7 — ABNORMAL LOW
GFR calc Af Amer: >60
Sodium: 137
Total Protein: 5.6 — ABNORMAL LOW

## 2011-03-27 LAB — POCT I-STAT 4, (NA,K, GLUC, HGB,HCT): Potassium: 4

## 2011-12-23 DIAGNOSIS — C61 Malignant neoplasm of prostate: Secondary | ICD-10-CM

## 2011-12-23 HISTORY — DX: Malignant neoplasm of prostate: C61

## 2012-02-03 ENCOUNTER — Ambulatory Visit
Admission: RE | Admit: 2012-02-03 | Discharge: 2012-02-03 | Disposition: A | Discharge status: Home / Self Care | Payer: Medicare Other | Source: Ambulatory Visit | Attending: Radiation Oncology | Admitting: Radiation Oncology

## 2012-02-03 ENCOUNTER — Encounter: Payer: Self-pay | Admitting: Radiation Oncology

## 2012-02-03 VITALS — BP 178/91 | HR 83 | Temp 98.1°F | Resp 18 | Ht 73.0 in | Wt 258.0 lb

## 2012-02-03 DIAGNOSIS — Z7982 Long term (current) use of aspirin: Secondary | ICD-10-CM | POA: Insufficient documentation

## 2012-02-03 DIAGNOSIS — C61 Malignant neoplasm of prostate: Secondary | ICD-10-CM | POA: Insufficient documentation

## 2012-02-03 DIAGNOSIS — Z87891 Personal history of nicotine dependence: Secondary | ICD-10-CM | POA: Insufficient documentation

## 2012-02-03 DIAGNOSIS — M109 Gout, unspecified: Secondary | ICD-10-CM | POA: Insufficient documentation

## 2012-02-03 DIAGNOSIS — K219 Gastro-esophageal reflux disease without esophagitis: Secondary | ICD-10-CM | POA: Insufficient documentation

## 2012-02-03 DIAGNOSIS — Z79899 Other long term (current) drug therapy: Secondary | ICD-10-CM | POA: Insufficient documentation

## 2012-02-03 HISTORY — DX: Gastro-esophageal reflux disease without esophagitis: K21.9

## 2012-02-03 HISTORY — DX: Unspecified osteoarthritis, unspecified site: M19.90

## 2012-02-03 HISTORY — DX: Barrett's esophagus without dysplasia: K22.70

## 2012-02-03 HISTORY — DX: Gout, unspecified: M10.9

## 2012-02-03 HISTORY — DX: Family history of colonic polyps: Z83.71

## 2012-02-03 HISTORY — DX: Family history of colon polyps, unspecified: Z83.719

## 2012-02-03 NOTE — Progress Notes (Signed)
Please see the Nurse Progress Note in the MD Initial Consult Encounter for this patient. 

## 2012-02-03 NOTE — Progress Notes (Signed)
Radiation Oncology         [phone] ________________________________  Initial outpatient Consultation  Name: [name] MRN: [id number]  Date: 02/03/2012  DOB: [birth year]  NW:GNFAO,ZHYQMV DAVIDSON, MD  Anner Crete, MD   REFERRING PHYSICIAN: Anner Crete, MD  DIAGNOSIS: Stage TIC Gleason's 7 adenocarcinoma of the prostate  HISTORY OF PRESENT ILLNESS::[name] is a 71 y.o. male who is seen out of the courtesy of Dr. Bjorn Pippin for an opinion concerning radiation therapy as part of management of patient's recently diagnosed early stage prostate cancer.  The patient was recently found to have an elevated PSA of 5.3 by Dr. Renne Crigler.  This patient was referred to Dr. Annabell Howells for evaluation.  Transrectal ultrasound revealed a prostate volume of 67 cubic centimeters. there were no hypoechoic lesions noted. Biopsy from the right lateral base revealed adenocarcinoma with a Gleason score of 7 (3+4) disease. This involved 20% of one core. Several other biopsies revealed a high-grade prostatic intraepithelial neoplasia.   PREVIOUS RADIATION THERAPY: No  PAST MEDICAL HISTORY:  has a past medical history of Gout; GERD (gastroesophageal reflux disease); Barrett's esophagus; FH: colonic polyps; and Arthritis.    PAST SURGICAL HISTORY: Past Surgical History  Procedure Date  . Fracture left femur repair     FAMILY HISTORY: family history is not on file.  SOCIAL HISTORY:  reports that he quit smoking about 40 years ago. His smoking use included Cigarettes. He has a 20 pack-year smoking history. He does not have any smokeless tobacco history on file. He reports that he drinks alcohol. He reports that he does not use illicit drugs.  ALLERGIES: Review of patient's allergies indicates no known allergies.  MEDICATIONS:  Current Outpatient Prescriptions  Medication Sig Dispense Refill  . allopurinol (ZYLOPRIM) 100 MG tablet Take 300 mg by mouth daily.      Marland Kitchen aspirin 81 MG chewable tablet Chew  81 mg by mouth daily.      Marland Kitchen omeprazole (PRILOSEC) 20 MG capsule Take 20 mg by mouth daily.        REVIEW OF SYSTEMS:  A 15 point review of systems is documented in the electronic medical record. This was obtained by the nursing staff. However, I reviewed this with the patient to discuss relevant findings and make appropriate changes.    patient denies any new bony pain. He has some pain in his left leg from his prior trauma to this region.  the patient completed the international prostate symptom score with total score of 5 representing minimal symptomatology.  The patient does have erectile dysfunction.   PHYSICAL EXAM:  height is 6\' 1"  (1.854 m) and weight is 258 lb (117.028 kg). His oral temperature is 98.1 F (36.7 C). His blood pressure is 178/91 and his pulse is 83. His respiration is 18.  the pupils are equal round reactive to light. The extraocular eye movements are intact. The tongue is midline. No palpable adenopathy in the neck and supraclavicular or axillary areas. The lungs are clear to auscultation. The heart has a regular rhythm and rate. The abdomen is  soft and nontender with normal bowel sounds. A rectal exam is not performed today at the patient's request. Motor strength is 5 out of 5 in the proximal and distal muscle groups in the upper lower extremities. Peripheral pulses are good. Patient has some scars along the left upper left leg from his prior trauma to this region.   LABORATORY DATA:  Lab Results  Component Value Date  WBC 5.1 10/04/2010   HGB 8.7* 10/04/2010   HCT 25.3* 10/04/2010   MCV 94.4 10/04/2010   PLT 103 SPECIMEN CHECKED FOR CLOTS REPEATED TO VERIFY* 10/04/2010   Lab Results  Component Value Date   NA 137 10/04/2010   K 3.7 10/04/2010   CL 106 10/04/2010   CO2 27 10/04/2010   Lab Results  Component Value Date   ALT 9 05/16/2008   AST 17 05/16/2008   ALKPHOS 48 05/16/2008   BILITOT 1.1 05/16/2008     RADIOGRAPHY: None indicated for early stage disease.     IMPRESSION: Stage TI C Gleason's 7 (3+4) low-volume prostate cancer. I discussed options to consider for the patient including watchful waiting, radical prostatectomy, cryotherapy. We also discussed his radiation therapy options. As above the patient's gland is enlarged is 67 cubic centimeters and he would not be a candidate for brachytherapy alone at this time. With  shrinkage however this may be a potential option for the patient.  Given the patient's young age I would be hesitant in recommending watchful waiting.  I discussed potential side effects as well as long-term toxicities all these above-mentioned treatments. At this time the patient appears to be leaning towards intensity modulated ration therapy for definitive management of his prostate cancer.    PLAN: The patient will contact the urology or radiation oncology once he has made his final decision concerning treatment.  I spent 60 minutes minutes face to face with the patient and more than 50% of that time was spent in counseling and/or coordination of care.   ------------------------------------------------   Billie Lade, PhD, MD

## 2012-02-03 NOTE — Progress Notes (Signed)
HERE TODAY FOR PROSTATE CANCER.....PSA...5.3.  PROSTATE VOLUME...67CC.......GLEASON SCORE...7.......IPSS......6

## 2012-02-13 ENCOUNTER — Encounter: Payer: Self-pay | Admitting: Radiation Oncology

## 2012-02-13 NOTE — Progress Notes (Signed)
   Department of Radiation Oncology  Phone:  [phone] Fax:        [phone]   Progress note:  Earlier today the patient called and said that he would like to proceed with radiation therapy for definitive management of his prostate cancer. He will be set up to have gold fiducial markers placed in preparation for image guided intensity modulated radiation therapy. We have placed a call into Dr. Belva Crome office to schedule this procedure prior to his simulation.  -----------------------------------  Billie Lade, PhD, MD

## 2012-02-19 ENCOUNTER — Telehealth: Payer: Self-pay | Admitting: *Deleted

## 2012-02-19 NOTE — Telephone Encounter (Signed)
XXXX 

## 2012-02-19 NOTE — Telephone Encounter (Signed)
CALLED PATIENT TO INFORM OF GOLD SEED PLACEMENT ON 04-08-12- ARRIVAL TIME- 1:45 PM AT DR. Belva Crome OFFICE, AND HIS SIM ON 04-13-12 AT 10:00 AM AT DR. KINARD'S OFFICE, SPOKE WITH PATIENT'S WIFE, GILDA AND THEY ARE AWARE OF THESE APPTS.

## 2012-03-17 NOTE — Addendum Note (Signed)
Encounter addended by: Delynn Flavin, RN on: 03/17/2012 11:50 AM<BR>     Documentation filed: Charges VN

## 2012-03-24 DIAGNOSIS — Z8679 Personal history of other diseases of the circulatory system: Secondary | ICD-10-CM

## 2012-03-24 HISTORY — DX: Personal history of other diseases of the circulatory system: Z86.79

## 2012-03-30 ENCOUNTER — Encounter: Payer: Self-pay | Admitting: Cardiology

## 2012-03-30 ENCOUNTER — Ambulatory Visit (INDEPENDENT_AMBULATORY_CARE_PROVIDER_SITE_OTHER): Payer: Medicare Other | Admitting: Cardiology

## 2012-03-30 VITALS — BP 166/88 | HR 76 | Ht 75.0 in | Wt 249.0 lb

## 2012-03-30 DIAGNOSIS — R0989 Other specified symptoms and signs involving the circulatory and respiratory systems: Secondary | ICD-10-CM

## 2012-03-30 DIAGNOSIS — I10 Essential (primary) hypertension: Secondary | ICD-10-CM

## 2012-03-30 DIAGNOSIS — R0609 Other forms of dyspnea: Secondary | ICD-10-CM

## 2012-03-30 DIAGNOSIS — R0600 Dyspnea, unspecified: Secondary | ICD-10-CM

## 2012-03-30 LAB — BRAIN NATRIURETIC PEPTIDE: Pro B Natriuretic peptide (BNP): 31 pg/mL (ref 0.0–100.0)

## 2012-03-30 MED ORDER — AMLODIPINE BESYLATE 5 MG PO TABS: 5.0000 mg | ORAL_TABLET | Freq: Every day | ORAL | Status: DC

## 2012-03-30 NOTE — Assessment & Plan Note (Signed)
Patient presents for evaluation of dyspnea. Etiology unclear. Apparently had chest x-ray and pulmonary function test recently that were unrevealing. We will obtain those results. He has a history of unexplained pulmonary embolus treated with one year of Coumadin. He had a femur fracture 1-1/2 years ago. He has recently been diagnosed with prostate cancer which certainly could be associated with hypercoagulable state. Check d-dimer. If elevated he will need a CT scan to exclude pulmonary embolus. I will also check a BNP. We'll arrange an echocardiogram to quantify LV function and a dobutamine echocardiogram to exclude ischemia.

## 2012-03-30 NOTE — Assessment & Plan Note (Signed)
New diagnosis of hypertension. Add Norvasc 5 mg daily and increase as needed.

## 2012-03-30 NOTE — Progress Notes (Signed)
  HPI: 71 year old male with no prior cardiac history for evaluation of dyspnea. Patient has noticed dyspnea on exertion for approximately 6 months. This occurs with moderate activities. There is no orthopnea, PND, syncope or chest pain. He occasionally has mild edema in his left lower extremity which has been present since fracturing his femur in April of 2012. Note he also has a history of unexplained pulmonary embolus approximately 8 years ago treated with one year of Coumadin. Because of his dyspnea cardiology was asked to evaluate. Also note he had a chest x-ray and spirometry and his primary care physician's office recently records pending.  Current Outpatient Prescriptions  Medication Sig Dispense Refill  . allopurinol (ZYLOPRIM) 300 MG tablet Take 300 mg by mouth daily.      Marland Kitchen aspirin 81 MG tablet Take 81 mg by mouth daily.      Marland Kitchen omeprazole (PRILOSEC) 20 MG capsule Take 20 mg by mouth daily.        No Known Allergies  Past Medical History  Diagnosis Date  . Gout   . GERD (gastroesophageal reflux disease)   . Barrett's esophagus   . FH: colonic polyps   . Arthritis   . Hypertension   . Prostate cancer   . Pulmonary embolus     Past Surgical History  Procedure Date  . Fracture left femur repair   . Tonsillectomy     History   Social History  . Marital Status: Married    Spouse Name: N/A    Number of Children: 3  . Years of Education: N/A   Occupational History  .      Quarry manager   Social History Main Topics  . Smoking status: Former Smoker -- 1.0 packs/day for 20 years    Types: Cigarettes    Quit date: 12/23/1971  . Smokeless tobacco: Not on file  . Alcohol Use: 0.0 oz/week     2 drinks per night  . Drug Use: No  . Sexually Active: Not on file   Other Topics Concern  . Not on file   Social History Narrative  . No narrative on file    Family History  Problem Relation Age of Onset  . Heart disease      No family history    ROS: no fevers  or chills, productive cough, hemoptysis, dysphasia, odynophagia, melena, hematochezia, dysuria, hematuria, rash, seizure activity, orthopnea, PND, pedal edema, claudication. Remaining systems are negative.  Physical Exam:   Blood pressure 166/88, pulse 76, height 6\' 3"  (1.905 m), weight 249 lb (112.946 kg).  General:  Well developed/well nourished in NAD Skin warm/dry Patient not depressed No peripheral clubbing Back-normal HEENT-normal/normal eyelids Neck supple/normal carotid upstroke bilaterally; no bruits; no JVD; no thyromegaly chest - CTA/ normal expansion CV - RRR/normal S1 and S2; no murmurs, rubs or gallops;  PMI nondisplaced Abdomen -NT/ND, no HSM, no mass, + bowel sounds, positive bruit 2+ femoral pulses, no bruits Ext-no edema, chords, 2+ DP Neuro-grossly nonfocal  ECG 03/09/2012-sinus rhythm with a rate of 76. Left anterior fascicular block. Right bundle branch block. No ST changes. No significant change compared to 10/08/2011.

## 2012-03-30 NOTE — Patient Instructions (Addendum)
Your physician recommends that you schedule a follow-up appointment in: 8 WEEKS WITH DR Jens Som  Your physician has requested that you have an echocardiogram. Echocardiography is a painless test that uses sound waves to create images of your heart. It provides your doctor with information about the size and shape of your heart and how well your heart's chambers and valves are working. This procedure takes approximately one hour. There are no restrictions for this procedure.   Your physician recommends that you HAVE LAB WORK TODAY  Your physician has requested that you have a dobutamine echocardiogram. For further information please visit [email or web address] Please follow instruction sheet as given.   Your physician has requested that you have an abdominal aorta duplex. During this test, an ultrasound is used to evaluate the aorta. Allow 30 minutes for this exam. Do not eat after midnight the day before and avoid carbonated beverages    START AMLODIPINE=NORVASC 5 MG ONCE DAILY

## 2012-03-30 NOTE — Assessment & Plan Note (Signed)
Abdominal ultrasound to exclude aneurysm. 

## 2012-03-31 ENCOUNTER — Other Ambulatory Visit: Payer: Self-pay | Admitting: Internal Medicine

## 2012-03-31 ENCOUNTER — Other Ambulatory Visit: Payer: Self-pay | Admitting: *Deleted

## 2012-03-31 DIAGNOSIS — R7989 Other specified abnormal findings of blood chemistry: Secondary | ICD-10-CM

## 2012-03-31 DIAGNOSIS — R413 Other amnesia: Secondary | ICD-10-CM

## 2012-04-01 ENCOUNTER — Ambulatory Visit (INDEPENDENT_AMBULATORY_CARE_PROVIDER_SITE_OTHER)
Admission: RE | Admit: 2012-04-01 | Discharge: 2012-04-01 | Disposition: A | Discharge status: Home / Self Care | Payer: Medicare Other | Source: Ambulatory Visit | Attending: Cardiology | Admitting: Cardiology

## 2012-04-01 DIAGNOSIS — R791 Abnormal coagulation profile: Secondary | ICD-10-CM

## 2012-04-01 DIAGNOSIS — R7989 Other specified abnormal findings of blood chemistry: Secondary | ICD-10-CM

## 2012-04-01 MED ORDER — IOHEXOL 350 MG/ML SOLN
80.0000 mL | Freq: Once | INTRAVENOUS | Status: AC | PRN
  Administered 2012-04-01: 80 mL via INTRAVENOUS

## 2012-04-03 ENCOUNTER — Encounter (HOSPITAL_COMMUNITY): Payer: Self-pay | Admitting: Anesthesiology

## 2012-04-03 ENCOUNTER — Emergency Department (HOSPITAL_COMMUNITY): Payer: Medicare Other

## 2012-04-03 ENCOUNTER — Encounter (HOSPITAL_COMMUNITY): Payer: Self-pay | Admitting: *Deleted

## 2012-04-03 ENCOUNTER — Inpatient Hospital Stay (HOSPITAL_COMMUNITY): Payer: Medicare Other | Admitting: Anesthesiology

## 2012-04-03 ENCOUNTER — Encounter (HOSPITAL_COMMUNITY)
Admission: EM | Disposition: A | Discharge status: Home / Self Care | Payer: Self-pay | Source: Home / Self Care | Attending: Internal Medicine

## 2012-04-03 ENCOUNTER — Inpatient Hospital Stay (HOSPITAL_COMMUNITY)
Admission: EM | Admit: 2012-04-03 | Discharge: 2012-04-06 | DRG: 026 | Disposition: A | Discharge status: Home / Self Care | Payer: Medicare Other | Attending: Internal Medicine | Admitting: Internal Medicine

## 2012-04-03 DIAGNOSIS — E876 Hypokalemia: Secondary | ICD-10-CM | POA: Diagnosis not present

## 2012-04-03 DIAGNOSIS — R4701 Aphasia: Secondary | ICD-10-CM | POA: Diagnosis present

## 2012-04-03 DIAGNOSIS — C61 Malignant neoplasm of prostate: Secondary | ICD-10-CM | POA: Diagnosis present

## 2012-04-03 DIAGNOSIS — I10 Essential (primary) hypertension: Secondary | ICD-10-CM | POA: Diagnosis present

## 2012-04-03 DIAGNOSIS — Q549 Hypospadias, unspecified: Secondary | ICD-10-CM

## 2012-04-03 DIAGNOSIS — R32 Unspecified urinary incontinence: Secondary | ICD-10-CM | POA: Diagnosis not present

## 2012-04-03 DIAGNOSIS — R339 Retention of urine, unspecified: Secondary | ICD-10-CM | POA: Diagnosis present

## 2012-04-03 DIAGNOSIS — K219 Gastro-esophageal reflux disease without esophagitis: Secondary | ICD-10-CM | POA: Diagnosis present

## 2012-04-03 DIAGNOSIS — Z87891 Personal history of nicotine dependence: Secondary | ICD-10-CM

## 2012-04-03 DIAGNOSIS — Z79899 Other long term (current) drug therapy: Secondary | ICD-10-CM

## 2012-04-03 DIAGNOSIS — M129 Arthropathy, unspecified: Secondary | ICD-10-CM | POA: Diagnosis present

## 2012-04-03 DIAGNOSIS — Z86711 Personal history of pulmonary embolism: Secondary | ICD-10-CM

## 2012-04-03 DIAGNOSIS — S065X9A Traumatic subdural hemorrhage with loss of consciousness of unspecified duration, initial encounter: Secondary | ICD-10-CM

## 2012-04-03 DIAGNOSIS — G8190 Hemiplegia, unspecified affecting unspecified side: Secondary | ICD-10-CM | POA: Diagnosis present

## 2012-04-03 DIAGNOSIS — I6200 Nontraumatic subdural hemorrhage, unspecified: Principal | ICD-10-CM | POA: Diagnosis present

## 2012-04-03 DIAGNOSIS — M109 Gout, unspecified: Secondary | ICD-10-CM | POA: Diagnosis present

## 2012-04-03 HISTORY — PX: CRANIOTOMY: SHX93

## 2012-04-03 LAB — CBC WITH DIFFERENTIAL/PLATELET
Eosinophils Relative: 4 % (ref 0–5)
HCT: 43.7 % (ref 39.0–52.0)
Hemoglobin: 15.1 g/dL (ref 13.0–17.0)
Lymphocytes Relative: 21 % (ref 12–46)
MCHC: 34.6 g/dL (ref 30.0–36.0)
MCV: 94 fL (ref 78.0–100.0)
Monocytes Absolute: 0.8 10*3/uL (ref 0.1–1.0)
Monocytes Relative: 12 % (ref 3–12)
Neutro Abs: 4.4 10*3/uL (ref 1.7–7.7)
WBC: 7 10*3/uL (ref 4.0–10.5)

## 2012-04-03 LAB — TROPONIN I: Troponin I: <0.3 ng/mL (ref <0.30)

## 2012-04-03 LAB — COMPREHENSIVE METABOLIC PANEL
BUN: 17 mg/dL (ref 6–23)
CO2: 24 mEq/L (ref 19–32)
Calcium: 9.8 mg/dL (ref 8.4–10.5)
Chloride: 100 mEq/L (ref 96–112)
Creatinine, Ser: 0.85 mg/dL (ref 0.50–1.35)
GFR calc Af Amer: >90 mL/min (ref >90)
GFR calc non Af Amer: 85 mL/min — ABNORMAL LOW (ref >90)
Glucose, Bld: 100 mg/dL — ABNORMAL HIGH (ref 70–99)
Total Bilirubin: 0.7 mg/dL (ref 0.3–1.2)

## 2012-04-03 SURGERY — CRANIOTOMY HEMATOMA EVACUATION SUBDURAL
Anesthesia: General | Site: Head | Laterality: Left | Wound class: Clean

## 2012-04-03 MED ORDER — BUPIVACAINE HCL (PF) 0.25 % IJ SOLN
INTRAMUSCULAR | Status: DC | PRN
  Administered 2012-04-03: 10 mL

## 2012-04-03 MED ORDER — LIDOCAINE HCL (CARDIAC) 20 MG/ML IV SOLN
INTRAVENOUS | Status: DC | PRN
  Administered 2012-04-03: 80 mg via INTRAVENOUS

## 2012-04-03 MED ORDER — SODIUM CHLORIDE 0.9 % IV SOLN
INTRAVENOUS | Status: DC | PRN
  Administered 2012-04-03: 21:00:00 via INTRAVENOUS

## 2012-04-03 MED ORDER — CEFAZOLIN SODIUM-DEXTROSE 2-3 GM-% IV SOLR
INTRAVENOUS | Status: DC | PRN
  Administered 2012-04-03: 2 g via INTRAVENOUS

## 2012-04-03 MED ORDER — DEXTROSE 5 % IV SOLN
INTRAVENOUS | Status: DC | PRN
  Administered 2012-04-03: 21:00:00 via INTRAVENOUS

## 2012-04-03 MED ORDER — BACITRACIN ZINC 500 UNIT/GM EX OINT
TOPICAL_OINTMENT | CUTANEOUS | Status: DC | PRN
  Administered 2012-04-03: 1 via TOPICAL

## 2012-04-03 MED ORDER — 0.9 % SODIUM CHLORIDE (POUR BTL) OPTIME
TOPICAL | Status: DC | PRN
  Administered 2012-04-03 (×3): 1000 mL

## 2012-04-03 MED ORDER — THROMBIN 20000 UNITS EX SOLR
CUTANEOUS | Status: DC | PRN
  Administered 2012-04-03: 22:00:00 via TOPICAL

## 2012-04-03 MED ORDER — LIDOCAINE HCL 4 % MT SOLN
OROMUCOSAL | Status: DC | PRN
  Administered 2012-04-03: 4 mL via TOPICAL

## 2012-04-03 MED ORDER — GLYCOPYRROLATE 0.2 MG/ML IJ SOLN
INTRAMUSCULAR | Status: DC | PRN
  Administered 2012-04-03: .6 mg via INTRAVENOUS

## 2012-04-03 MED ORDER — PHENYLEPHRINE HCL 10 MG/ML IJ SOLN
INTRAMUSCULAR | Status: DC | PRN
  Administered 2012-04-03: 80 ug via INTRAVENOUS
  Administered 2012-04-03: 160 ug via INTRAVENOUS
  Administered 2012-04-03: 80 ug via INTRAVENOUS

## 2012-04-03 MED ORDER — PROPOFOL 10 MG/ML IV BOLUS
INTRAVENOUS | Status: DC | PRN
  Administered 2012-04-03: 160 mg via INTRAVENOUS

## 2012-04-03 MED ORDER — ROCURONIUM BROMIDE 100 MG/10ML IV SOLN
INTRAVENOUS | Status: DC | PRN
  Administered 2012-04-03: 50 mg via INTRAVENOUS

## 2012-04-03 MED ORDER — LIDOCAINE-EPINEPHRINE 1 %-1:100000 IJ SOLN
INTRAMUSCULAR | Status: DC | PRN
  Administered 2012-04-03: 10 mL

## 2012-04-03 MED ORDER — ONDANSETRON HCL 4 MG/2ML IJ SOLN
INTRAMUSCULAR | Status: DC | PRN
  Administered 2012-04-03: 4 mg via INTRAVENOUS

## 2012-04-03 MED ORDER — FENTANYL CITRATE 0.05 MG/ML IJ SOLN
INTRAMUSCULAR | Status: DC | PRN
  Administered 2012-04-03 (×6): 50 ug via INTRAVENOUS

## 2012-04-03 MED ORDER — EPHEDRINE SULFATE 50 MG/ML IJ SOLN
INTRAMUSCULAR | Status: DC | PRN
  Administered 2012-04-03: 10 mg via INTRAVENOUS

## 2012-04-03 MED ORDER — FENTANYL CITRATE 0.05 MG/ML IJ SOLN
50.0000 ug | Freq: Once | INTRAMUSCULAR | Status: AC
  Administered 2012-04-04: 50 ug via INTRAVENOUS
  Filled 2012-04-03: qty 2

## 2012-04-03 MED ORDER — NEOSTIGMINE METHYLSULFATE 1 MG/ML IJ SOLN
INTRAMUSCULAR | Status: DC | PRN
  Administered 2012-04-03: 4 mg via INTRAVENOUS

## 2012-04-03 MED ORDER — SODIUM CHLORIDE 0.9 % IR SOLN
Status: DC | PRN
  Administered 2012-04-03: 22:00:00

## 2012-04-03 MED ORDER — HYDROMORPHONE HCL PF 1 MG/ML IJ SOLN: 0.2500 mg | INTRAMUSCULAR | Status: DC | PRN

## 2012-04-03 MED ORDER — MICROFIBRILLAR COLL HEMOSTAT EX PADS
MEDICATED_PAD | CUTANEOUS | Status: DC | PRN
  Administered 2012-04-03: 1 via TOPICAL

## 2012-04-03 MED ORDER — ARTIFICIAL TEARS OP OINT
TOPICAL_OINTMENT | OPHTHALMIC | Status: DC | PRN
  Administered 2012-04-03: 1 via OPHTHALMIC

## 2012-04-03 SURGICAL SUPPLY — 78 items
APL SKNCLS STERI-STRIP NONHPOA (GAUZE/BANDAGES/DRESSINGS)
BANDAGE GAUZE 4  KLING STR (GAUZE/BANDAGES/DRESSINGS) IMPLANT
BANDAGE GAUZE ELAST BULKY 4 IN (GAUZE/BANDAGES/DRESSINGS) IMPLANT
BENZOIN TINCTURE PRP APPL 2/3 (GAUZE/BANDAGES/DRESSINGS) IMPLANT
BIT DRILL WIRE PASS 1.3MM (BIT) IMPLANT
BRUSH SCRUB EZ 1% IODOPHOR (MISCELLANEOUS) ×1 IMPLANT
BRUSH SCRUB EZ PLAIN DRY (MISCELLANEOUS) ×2 IMPLANT
BUR ACORN 6.0 PRECISION (BURR) ×2 IMPLANT
BUR ROUTER D-58 CRANI (BURR) ×1 IMPLANT
CANISTER SUCTION 2500CC (MISCELLANEOUS) ×3 IMPLANT
CATH ROBINSON RED A/P 12FR (CATHETERS) IMPLANT
CLIP TI MEDIUM 6 (CLIP) IMPLANT
CLOTH BEACON ORANGE TIMEOUT ST (SAFETY) ×2 IMPLANT
CONT SPEC 4OZ CLIKSEAL STRL BL (MISCELLANEOUS) ×2 IMPLANT
CORDS BIPOLAR (ELECTRODE) ×1 IMPLANT
DRAIN JACKSON PRATT 10MM FLAT (MISCELLANEOUS) ×1 IMPLANT
DRAIN PENROSE 1/2X12 LTX STRL (WOUND CARE) IMPLANT
DRAIN SNY WOU 7FLT (WOUND CARE) IMPLANT
DRAPE NEUROLOGICAL W/INCISE (DRAPES) ×2 IMPLANT
DRAPE SURG IRRIG POUCH 19X23 (DRAPES) IMPLANT
DRAPE WARM FLUID 44X44 (DRAPE) ×2 IMPLANT
DRESSING TELFA 8X3 (GAUZE/BANDAGES/DRESSINGS) ×2 IMPLANT
DRILL WIRE PASS 1.3MM (BIT)
DRSG OPSITE 4X5.5 SM (GAUZE/BANDAGES/DRESSINGS) ×3 IMPLANT
DRSG PAD ABDOMINAL 8X10 ST (GAUZE/BANDAGES/DRESSINGS) IMPLANT
DURAPREP 6ML APPLICATOR 50/CS (WOUND CARE) ×2 IMPLANT
ELECT CAUTERY BLADE 6.4 (BLADE) IMPLANT
ELECT REM PT RETURN 9FT ADLT (ELECTROSURGICAL) ×2
ELECTRODE REM PT RTRN 9FT ADLT (ELECTROSURGICAL) ×1 IMPLANT
EVACUATOR SILICONE 100CC (DRAIN) ×1 IMPLANT
GAUZE SPONGE 4X4 16PLY XRAY LF (GAUZE/BANDAGES/DRESSINGS) IMPLANT
GLOVE BIO SURGEON STRL SZ8 (GLOVE) ×2 IMPLANT
GLOVE BIOGEL PI IND STRL 8 (GLOVE) ×1 IMPLANT
GLOVE BIOGEL PI IND STRL 8.5 (GLOVE) ×1 IMPLANT
GLOVE BIOGEL PI INDICATOR 8 (GLOVE)
GLOVE BIOGEL PI INDICATOR 8.5 (GLOVE) ×2
GLOVE ECLIPSE 7.5 STRL STRAW (GLOVE) ×1 IMPLANT
GLOVE EXAM NITRILE LRG STRL (GLOVE) IMPLANT
GLOVE EXAM NITRILE MD LF STRL (GLOVE) IMPLANT
GLOVE EXAM NITRILE XL STR (GLOVE) IMPLANT
GLOVE EXAM NITRILE XS STR PU (GLOVE) IMPLANT
GLOVE SURG SS PI 8.0 STRL IVOR (GLOVE) ×2 IMPLANT
GOWN BRE IMP SLV AUR LG STRL (GOWN DISPOSABLE) ×2 IMPLANT
GOWN BRE IMP SLV AUR XL STRL (GOWN DISPOSABLE) IMPLANT
GOWN STRL REIN 2XL LVL4 (GOWN DISPOSABLE) ×1 IMPLANT
HEMOSTAT SURGICEL 2X14 (HEMOSTASIS) ×2 IMPLANT
KIT BASIN OR (CUSTOM PROCEDURE TRAY) ×2 IMPLANT
KIT ROOM TURNOVER OR (KITS) ×2 IMPLANT
NDL HYPO 25X1 1.5 SAFETY (NEEDLE) ×1 IMPLANT
NEEDLE HYPO 25X1 1.5 SAFETY (NEEDLE) ×2 IMPLANT
NS IRRIG 1000ML POUR BTL (IV SOLUTION) ×4 IMPLANT
PACK CRANIOTOMY (CUSTOM PROCEDURE TRAY) ×2 IMPLANT
PAD ARMBOARD 7.5X6 YLW CONV (MISCELLANEOUS) ×4 IMPLANT
PATTIES SURGICAL .5 X.5 (GAUZE/BANDAGES/DRESSINGS) IMPLANT
PATTIES SURGICAL .5 X3 (DISPOSABLE) IMPLANT
PATTIES SURGICAL 1X1 (DISPOSABLE) IMPLANT
PIN MAYFIELD SKULL DISP (PIN) IMPLANT
PLATE 1.5  2HOLE LNG NEURO (Plate) ×3 IMPLANT
PLATE 1.5 2HOLE LNG NEURO (Plate) IMPLANT
SCREW SELF DRILL HT 1.5/4MM (Screw) ×6 IMPLANT
SPECIMEN JAR SMALL (MISCELLANEOUS) IMPLANT
SPONGE GAUZE 4X4 12PLY (GAUZE/BANDAGES/DRESSINGS) IMPLANT
SPONGE NEURO XRAY DETECT 1X3 (DISPOSABLE) IMPLANT
SPONGE SURGIFOAM ABS GEL 12-7 (HEMOSTASIS) IMPLANT
STAPLER SKIN PROX WIDE 3.9 (STAPLE) ×2 IMPLANT
SUT ETHILON 3 0 FSL (SUTURE) ×1 IMPLANT
SUT ETHILON 3 0 PS 1 (SUTURE) IMPLANT
SUT NURALON 4 0 TR CR/8 (SUTURE) ×4 IMPLANT
SUT VIC AB 2-0 CP2 18 (SUTURE) ×4 IMPLANT
SUT VIC AB 3-0 SH 8-18 (SUTURE) IMPLANT
SYR 20ML ECCENTRIC (SYRINGE) ×2 IMPLANT
SYR CONTROL 10ML LL (SYRINGE) ×1 IMPLANT
TOWEL OR 17X24 6PK STRL BLUE (TOWEL DISPOSABLE) ×2 IMPLANT
TOWEL OR 17X26 10 PK STRL BLUE (TOWEL DISPOSABLE) ×2 IMPLANT
TRAP SPECIMEN MUCOUS 40CC (MISCELLANEOUS) IMPLANT
TRAY FOLEY CATH 14FRSI W/METER (CATHETERS) ×1 IMPLANT
UNDERPAD 30X30 INCONTINENT (UNDERPADS AND DIAPERS) IMPLANT
WATER STERILE IRR 1000ML POUR (IV SOLUTION) ×2 IMPLANT

## 2012-04-03 NOTE — ED Notes (Signed)
IV team paged to start IV.   

## 2012-04-03 NOTE — Transfer of Care (Signed)
Immediate Anesthesia Transfer of Care Note  Patient: [name]  Procedure(s) Performed: Procedure(s) (LRB) with comments: CRANIOTOMY HEMATOMA EVACUATION SUBDURAL (Left) - Left Craniotomy for Evacuation of Subdural Hematoma  Patient Location: PACU  Anesthesia Type: General  Level of Consciousness: awake, alert  and patient cooperative  Airway & Oxygen Therapy: Patient Spontanous Breathing and Patient connected to nasal cannula oxygen  Post-op Assessment: Report given to PACU RN, Post -op Vital signs reviewed and stable and Patient moving all extremities X 4  Post vital signs: Reviewed and stable  Complications: No apparent anesthesia complications

## 2012-04-03 NOTE — Op Note (Signed)
04/03/2012  10:30 PM  PATIENT:  [name]  71 y.o. male  PRE-OPERATIVE DIAGNOSIS:  Subdural Hematoma left with hemiparesis and aphasia  POST-OPERATIVE DIAGNOSIS:  Subdural Hematoma left with hemiparesis and aphasia  PROCEDURE:  Procedure(s) (LRB) with comments: CRANIOTOMY HEMATOMA EVACUATION SUBDURAL (Left) - Left Craniotomy for Evacuation of Subdural Hematoma  SURGEON:  Surgeon(s) and Role:    * Rory Xiang, MD - Primary  PHYSICIAN ASSISTANT:   ASSISTANTS: none   ANESTHESIA:   general  EBL:  Total I/O In: 50 [I.V.:50] Out: -   BLOOD ADMINISTERED:none  DRAINS: (10) Jackson-Pratt drain(s) with closed bulb suction in the subdural  space  LOCAL MEDICATIONS USED:  LIDOCAINE   SPECIMEN:  No Specimen  DISPOSITION OF SPECIMEN:  N/A  COUNTS:  YES  TOURNIQUET:  * No tourniquets in log *  DICTATION: Patient is 71 year old man who has had progressive difficulty with right sided weakness and aphasia and came to the ER today.  Head CT shows a large left Subdural hematoma.  .  It was elected to take patient to surgery for craniotomy for SDH.  Procedure:  Following smooth intubation, patient was placed in right semi-lateral position with blanket roll.  Head was placed on donut head holder and left frontal scalp was shaved and prepped and draped in usual sterile fashion.  Area of planned incision was infiltrated with lidocaine. A curvilinear incision was made and carried through temporalis fascia and muscle to expose calvarium.  Skull flap was elevated exposing subdural hematoma.  Dura was opened and subdural was evacuated.  The subdural space was irrigated to clearing.  Loculated subdural membranes were fenestrated.  Hemostasis was assured with irrigation.    The brain was considerably more relaxed after hematoma evacuation and was seen to reexpand to partially fill the defect and became pulsatile.  A #10 JP drain was inserted through a separate stab incision and anchored with a 3-0  Nylon stitch. The dura was closed with 4-0 neurilon sutures, bone flap was replaced with plates, the fascia and galea were closed with 2-0 vicryl sutures and the skin was re approximated with staples.  A sterile occlusive dressing was placed.  Patient was returned to a supine position and extubated in the operating room..  PLAN OF CARE: Admit to inpatient   PATIENT DISPOSITION:  PACU - hemodynamically stable.   Delay start of Pharmacological VTE agent (>24hrs) due to surgical blood loss or risk of bleeding: yes  

## 2012-04-03 NOTE — OR Nursing (Signed)
Foley insertion attempted with no urine return. Foley balloon never inflated and catheter was removed per Dr. Venetia Maxon.

## 2012-04-03 NOTE — Anesthesia Preprocedure Evaluation (Addendum)
Anesthesia Evaluation  Patient identified by MRN, date of birth, ID band Patient confused    Reviewed: Allergy & Precautions, H&P , NPO status , Patient's Chart, lab work & pertinent test results  Airway Mallampati: II TM Distance: >3 FB Neck ROM: Full    Dental   Pulmonary  breath sounds clear to auscultation        Cardiovascular hypertension, Rhythm:Regular Rate:Normal     Neuro/Psych    GI/Hepatic GERD-  ,  Endo/Other    Renal/GU      Musculoskeletal   Abdominal   Peds  Hematology   Anesthesia Other Findings   Reproductive/Obstetrics                          Anesthesia Physical Anesthesia Plan  ASA: III and Emergent  Anesthesia Plan: General   Post-op Pain Management:    Induction: Intravenous  Airway Management Planned: Oral ETT  Additional Equipment: Arterial line  Intra-op Plan:   Post-operative Plan: Extubation in OR and Possible Post-op intubation/ventilation  Informed Consent: I have reviewed the patients History and Physical, chart, labs and discussed the procedure including the risks, benefits and alternatives for the proposed anesthesia with the patient or authorized representative who has indicated his/her understanding and acceptance.   Dental advisory given  Plan Discussed with: CRNA and Surgeon  Anesthesia Plan Comments:        Anesthesia Quick Evaluation

## 2012-04-03 NOTE — ED Provider Notes (Signed)
History     CSN: [id number]  Arrival date & time 04/03/12  1552   First MD Initiated Contact with Patient 04/03/12 1639      Chief Complaint  Patient presents with  . unable to use his rt arn and rt leg     (Consider location/radiation/quality/duration/timing/severity/associated sxs/prior treatment) Patient is a 71 y.o. male presenting with extremity weakness. The history is provided by the patient and the spouse. No language interpreter was used.  Extremity Weakness This is a recurrent problem. The current episode started in the past 7 days. The problem occurs daily. The problem has been gradually worsening. Associated symptoms include weakness. Pertinent negatives include no chest pain, congestion, coughing, fever, headaches, nausea, neck pain, visual change or vomiting. The symptoms are aggravated by walking. He has tried nothing for the symptoms.   71 year old male here today with complaint from his wife that he was dragging his right lower extremity while walking to the mailbox today. States for the past week that he has had memory problems as well. States that he has had intermittent weakness on the right as well. Went to his doctor (Dr Renne Crigler) and he is scheduled for MRI of the brain on Monday. Past medical history as listed below. Patient seems to have a right-sided neglect and receptive and expressive aphasia on exam.    Past Medical History  Diagnosis Date  . Gout   . GERD (gastroesophageal reflux disease)   . Barrett's esophagus   . FH: colonic polyps   . Arthritis   . Hypertension   . Prostate cancer   . Pulmonary embolus     Past Surgical History  Procedure Date  . Fracture left femur repair   . Tonsillectomy     Family History  Problem Relation Age of Onset  . Heart disease      No family history    History  Substance Use Topics  . Smoking status: Former Smoker -- 1.0 packs/day for 20 years    Types: Cigarettes    Quit date: 12/23/1971  . Smokeless  tobacco: Not on file  . Alcohol Use: 0.0 oz/week     2 drinks per night      Review of Systems  Constitutional: Negative for fever.  HENT: Negative.  Negative for congestion and neck pain.   Eyes: Negative.   Respiratory: Negative.  Negative for cough and shortness of breath.   Cardiovascular: Negative.  Negative for chest pain.  Gastrointestinal: Negative.  Negative for nausea and vomiting.  Musculoskeletal: Positive for extremity weakness.  Neurological: Positive for speech difficulty and weakness. Negative for dizziness, light-headedness and headaches.  Psychiatric/Behavioral: Negative.   All other systems reviewed and are negative.    Allergies  Review of patient's allergies indicates no known allergies.  Home Medications   Current Outpatient Rx  Name Route Sig Dispense Refill  . ALLOPURINOL 300 MG PO TABS Oral Take 300 mg by mouth daily.    Marland Kitchen AMLODIPINE BESYLATE 5 MG PO TABS Oral Take 1 tablet (5 mg total) by mouth daily. 30 tablet 11  . OMEPRAZOLE 20 MG PO CPDR Oral Take 20 mg by mouth daily.      BP 168/93  Pulse 98  Temp 98.2 F (36.8 C) (Oral)  Resp 17  SpO2 100%  Physical Exam  Nursing note and vitals reviewed. Constitutional: He is oriented to person, place, and time. He appears well-developed and well-nourished.  HENT:  Head: Normocephalic.  Eyes: Conjunctivae normal and EOM are normal. Pupils  are equal, round, and reactive to light.  Neck: Normal range of motion. Neck supple.  Cardiovascular: Normal rate.   Pulmonary/Chest: Effort normal.  Abdominal: Soft.  Musculoskeletal: Normal range of motion.  Neurological: He is alert and oriented to person, place, and time. He has normal strength and normal reflexes. A cranial nerve deficit is present. No sensory deficit. He displays a negative Romberg sign. GCS eye subscore is 4. GCS verbal subscore is 4. GCS motor subscore is 6.       R side neglect intermittant.  Expressive and receptive aphasia.  Short term  memory problems. PEARL  Skin: Skin is warm and dry.  Psychiatric: He has a normal mood and affect.    ED Course  Procedures (including critical care time)  1800 Dr Venetia Maxon in OR will see patient when he is done.  Will get Dr. Renne Crigler to admit.    Labs Reviewed  COMPREHENSIVE METABOLIC PANEL - Abnormal; Notable for the following:    Glucose, Bld 100 (*)     GFR calc non Af Amer 85 (*)     All other components within normal limits  CBC WITH DIFFERENTIAL  TROPONIN I   Ct Head Wo Contrast  04/03/2012  *RADIOLOGY REPORT*  Clinical Data: Confusion.  Not using the right extremities.  CT HEAD WITHOUT CONTRAST  Technique:  Contiguous axial images were obtained from the base of the skull through the vertex without contrast.  Comparison: Multiple exams, including 10/02/2010 and 10/27/2007  Findings: Mixed density large left complex extra-axial fluid collection likely represents a subacute subdural hematoma with some acute components.  It is larger near the vertex measures up to 3.4 cm in thickness, and associated with 11 mm of left right midline shift.  A smaller right subacute subdural hematoma measures 7 mm in thickness.  There is effacement of left-sided cerebral sulci and mild effacement of the left lateral ventricle.  The brain stem, cerebellum, and basal ganglia appear intact.  No intraventricular hemorrhage is observed.  No overt transtentorial or uncal herniation.  No mass in the brain is noted.  No findings of acute CVA.  No skull fracture observed.  IMPRESSION:  1.  Mixed density large acute/subacute left frontoparietal subdural hematoma. 11 mm of left to right midline shift. 2.  Small right sided subdural hematoma near the vertex. 3.  Neurosurgical consultation recommended.  Critical Value/emergent results were called by telephone to Remi Haggard (who verbally acknowledged these results) at the time of interpretation on 04/03/2012 at 5:50 p.m.   Original Report Authenticated By: Dellia Cloud, M.D.      No diagnosis found.    MDM   71 year old male with bilateral subdural hematomas  CT head reviewed by myself with Left > Right subdural  with a shift. Patient will be admitted by the hospitalist team 4 and Dr. Venetia Maxon will see him in the ER when he gets out of the OR. Vital signs stable presently. Expressive and receptive aphasia and memory loss x1 week. Intermittent right side neglect/weakness. CBC troponin and cmet unremarkable.  Patient is not on anticoagulant.    Labs Reviewed  COMPREHENSIVE METABOLIC PANEL - Abnormal; Notable for the following:    Glucose, Bld 100 (*)     GFR calc non Af Amer 85 (*)     All other components within normal limits  CBC WITH DIFFERENTIAL  TROPONIN I         Remi Haggard, NP 04/03/12 1845

## 2012-04-03 NOTE — Anesthesia Postprocedure Evaluation (Signed)
  Anesthesia Post-op Note  Patient: [name]  Procedure(s) Performed: Procedure(s) (LRB) with comments: CRANIOTOMY HEMATOMA EVACUATION SUBDURAL (Left) - Left Craniotomy for Evacuation of Subdural Hematoma  Patient Location: PACU  Anesthesia Type: General  Level of Consciousness: awake and alert   Airway and Oxygen Therapy: Patient Spontanous Breathing  Post-op Pain: mild  Post-op Assessment: Post-op Vital signs reviewed, Patient's Cardiovascular Status Stable, Respiratory Function Stable, Patent Airway, No signs of Nausea or vomiting and Pain level controlled  Post-op Vital Signs: stable  Complications: No apparent anesthesia complications

## 2012-04-03 NOTE — ED Notes (Signed)
The pt has not been using his rt arm and rt leg just noticed today.  He has been slower in movement and scuffling for one week.  He is sl confused and he cannot remember a series of events

## 2012-04-03 NOTE — H&P (Signed)
History     Chief Complaint   Patient presents with   .  unable to use his rt arn and rt leg     Patient is a 71 y.o. male presenting with extremity weakness. The history is provided by the patient and the spouse. No language interpreter was used.  Extremity Weakness  This is a recurrent problem. The current episode started in the past 7 days. The problem occurs daily. The problem has been gradually worsening. Associated symptoms include weakness. Pertinent negatives include no chest pain, congestion, coughing, fever, headaches, nausea, neck pain, visual change or vomiting. The symptoms are aggravated by walking. He has tried nothing for the symptoms.  71 year old male here today with complaint from his wife that he was dragging his right lower extremity while walking to the mailbox today. States for the past week that he has had memory problems as well. States that he has had intermittent weakness on the right as well. Went to his doctor (Dr Renne Crigler) and he is scheduled for MRI of the brain on Monday. Past medical history as listed below. Patient seems to have a right-sided neglect and receptive and expressive aphasia on exam.  Patient had Head CT which shows large left subdural hematoma with 10.8 mm left to right shift.  There is a smaller right subdural without mass effect. Past Medical History   Diagnosis  Date   .  Gout    .  GERD (gastroesophageal reflux disease)    .  Barrett's esophagus    .  FH: colonic polyps    .  Arthritis    .  Hypertension    .  Prostate cancer    .  Pulmonary embolus     Past Surgical History   Procedure  Date   .  Fracture left femur repair    .  Tonsillectomy     Family History   Problem  Relation  Age of Onset   .  Heart disease        No family history    History   Substance Use Topics   .  Smoking status:  Former Smoker -- 1.0 packs/day for 20 years     Types:  Cigarettes     Quit date:  12/23/1971   .  Smokeless tobacco:  Not on file   .   Alcohol Use:  0.0 oz/week      2 drinks per night     Review of Systems  Constitutional: Negative for fever.  HENT: Negative. Negative for congestion and neck pain.  Eyes: Negative.  Respiratory: Negative. Negative for cough and shortness of breath.  Cardiovascular: Negative. Negative for chest pain.  Gastrointestinal: Negative. Negative for nausea and vomiting.  Musculoskeletal: Positive for extremity weakness.  Neurological: Positive for speech difficulty and weakness. Negative for dizziness, light-headedness and headaches.  Psychiatric/Behavioral: Negative.  All other systems reviewed and are negative.   Allergies   Review of patient's allergies indicates no known allergies.  Home Medications    Current Outpatient Rx   Name  Route  Sig  Dispense  Refill   .  ALLOPURINOL 300 MG PO TABS  Oral  Take 300 mg by mouth daily.     Marland Kitchen  AMLODIPINE BESYLATE 5 MG PO TABS  Oral  Take 1 tablet (5 mg total) by mouth daily.  30 tablet  11   .  OMEPRAZOLE 20 MG PO CPDR  Oral  Take 20 mg by mouth daily.  BP 168/93  Pulse 98  Temp 98.2 F (36.8 C) (Oral)  Resp 17  SpO2 100%  Physical Exam  Nursing note and vitals reviewed.  Constitutional: He is oriented to person, place, and time. He appears well-developed and well-nourished.  HENT:  Head: Normocephalic.  Eyes: Conjunctivae normal and EOM are normal. Pupils are equal, round, and reactive to light.  Neck: Normal range of motion. Neck supple.  Cardiovascular: Normal rate.  Pulmonary/Chest: Effort normal.  Abdominal: Soft.  Musculoskeletal: Normal range of motion.  Neurological: He is alert and oriented to person, place, and time. He has normal strength and normal reflexes. A cranial nerve deficit is present. No sensory deficit. He displays a negative Romberg sign. GCS eye subscore is 4. GCS verbal subscore is 4. GCS motor subscore is 6.  R side neglect intermittant. Expressive and receptive aphasia. Short term memory problems. PEARL    Skin: Skin is warm and dry.  Psychiatric: He has a normal mood and affect.   ED Course   Procedures (including critical care time)  1800 Dr Venetia Maxon in OR will see patient when he is done. Will get Dr. Renne Crigler to admit.  Labs Reviewed   COMPREHENSIVE METABOLIC PANEL - Abnormal; Notable for the following:    Glucose, Bld  100 (*)      GFR calc non Af Amer  85 (*)      All other components within normal limits   CBC WITH DIFFERENTIAL   TROPONIN I    Ct Head Wo Contrast  04/03/2012 *RADIOLOGY REPORT* Clinical Data: Confusion. Not using the right extremities. CT HEAD WITHOUT CONTRAST Technique: Contiguous axial images were obtained from the base of the skull through the vertex without contrast. Comparison: Multiple exams, including 10/02/2010 and 10/27/2007 Findings: Mixed density large left complex extra-axial fluid collection likely represents a subacute subdural hematoma with some acute components. It is larger near the vertex measures up to 3.4 cm in thickness, and associated with 11 mm of left right midline shift. A smaller right subacute subdural hematoma measures 7 mm in thickness. There is effacement of left-sided cerebral sulci and mild effacement of the left lateral ventricle. The brain stem, cerebellum, and basal ganglia appear intact. No intraventricular hemorrhage is observed. No overt transtentorial or uncal herniation. No mass in the brain is noted. No findings of acute CVA. No skull fracture observed. IMPRESSION: 1. Mixed density large acute/subacute left frontoparietal subdural hematoma. 11 mm of left to right midline shift. 2. Small right sided subdural hematoma near the vertex. 3. Neurosurgical consultation recommended. Critical Value/emergent results were called by telephone to Remi Haggard (who verbally acknowledged these results) at the time of interpretation on 04/03/2012 at 5:50 p.m. Original Report Authenticated By: Dellia Cloud, M.D.    MDM   71 year old male with  bilateral subdural hematomas CT head reviewed by myself with Left > Right subdural with a shift. Patient will be admitted by the hospitalist team 4 and Dr. Venetia Maxon will see him in the ER when he gets out of the OR. Vital signs stable presently. Expressive and receptive aphasia and memory loss x1 week. Intermittent right side neglect/weakness. CBC troponin and cmet unremarkable. Patient is not on anticoagulant. I have reviewed studies and discussed situation with Dr. Malachi Bonds, who was originally planning on admitting patient.  In light of large subdural and deteriorating neurologic status (sleepier and less responsive) patient will be taken directly to OR for craniotomy and evacuation of left subdural hematoma.  The right sided subdural is small  and should not require treatment.  I discussed my recommendations with the patient's wife, who wishes for me to proceed. Labs Reviewed   COMPREHENSIVE METABOLIC PANEL - Abnormal; Notable for the following:    Glucose, Bld  100 (*)      GFR calc non Af Amer  85 (*)      All other components within normal limits   CBC WITH DIFFERENTIAL   TROPONIN I

## 2012-04-03 NOTE — Anesthesia Procedure Notes (Signed)
Procedure Name: Intubation Date/Time: 04/03/2012 9:37 PM Performed by: Julianne Rice Z Pre-anesthesia Checklist: Patient identified, Timeout performed, Emergency Drugs available, Suction available and Patient being monitored Patient Re-evaluated:Patient Re-evaluated prior to inductionOxygen Delivery Method: Circle system utilized Preoxygenation: Pre-oxygenation with 100% oxygen Intubation Type: IV induction Ventilation: Mask ventilation without difficulty Laryngoscope Size: Mac and 4 Grade View: Grade I Tube type: Oral Tube size: 8.0 mm Number of attempts: 1 Airway Equipment and Method: Stylet and LTA kit utilized Placement Confirmation: ETT inserted through vocal cords under direct vision,  breath sounds checked- equal and bilateral and positive ETCO2 Secured at: 23 cm Tube secured with: Tape Dental Injury: Teeth and Oropharynx as per pre-operative assessment

## 2012-04-03 NOTE — Brief Op Note (Signed)
04/03/2012  10:30 PM  PATIENT:  [name]  71 y.o. male  PRE-OPERATIVE DIAGNOSIS:  Subdural Hematoma left with hemiparesis and aphasia  POST-OPERATIVE DIAGNOSIS:  Subdural Hematoma left with hemiparesis and aphasia  PROCEDURE:  Procedure(s) (LRB) with comments: CRANIOTOMY HEMATOMA EVACUATION SUBDURAL (Left) - Left Craniotomy for Evacuation of Subdural Hematoma  SURGEON:  Surgeon(s) and Role:    * Maeola Harman, MD - Primary  PHYSICIAN ASSISTANT:   ASSISTANTS: none   ANESTHESIA:   general  EBL:  Total I/O In: 50 [I.V.:50] Out: -   BLOOD ADMINISTERED:none  DRAINS: (10) Jackson-Pratt drain(s) with closed bulb suction in the subdural  space  LOCAL MEDICATIONS USED:  LIDOCAINE   SPECIMEN:  No Specimen  DISPOSITION OF SPECIMEN:  N/A  COUNTS:  YES  TOURNIQUET:  * No tourniquets in log *  DICTATION: Patient is 71 year old man who has had progressive difficulty with right sided weakness and aphasia and came to the ER today.  Head CT shows a large left Subdural hematoma.  .  It was elected to take patient to surgery for craniotomy for SDH.  Procedure:  Following smooth intubation, patient was placed in right semi-lateral position with blanket roll.  Head was placed on donut head holder and left frontal scalp was shaved and prepped and draped in usual sterile fashion.  Area of planned incision was infiltrated with lidocaine. A curvilinear incision was made and carried through temporalis fascia and muscle to expose calvarium.  Skull flap was elevated exposing subdural hematoma.  Dura was opened and subdural was evacuated.  The subdural space was irrigated to clearing.  Loculated subdural membranes were fenestrated.  Hemostasis was assured with irrigation.    The brain was considerably more relaxed after hematoma evacuation and was seen to reexpand to partially fill the defect and became pulsatile.  A #10 JP drain was inserted through a separate stab incision and anchored with a 3-0  Nylon stitch. The dura was closed with 4-0 neurilon sutures, bone flap was replaced with plates, the fascia and galea were closed with 2-0 vicryl sutures and the skin was re approximated with staples.  A sterile occlusive dressing was placed.  Patient was returned to a supine position and extubated in the operating room.Marland Kitchen  PLAN OF CARE: Admit to inpatient   PATIENT DISPOSITION:  PACU - hemodynamically stable.   Delay start of Pharmacological VTE agent (>24hrs) due to surgical blood loss or risk of bleeding: yes

## 2012-04-03 NOTE — Progress Notes (Signed)
Awake, alert, conversant.  MAEW with improved right hemiparesis.  Doing well.

## 2012-04-04 ENCOUNTER — Encounter (HOSPITAL_COMMUNITY): Payer: Self-pay | Admitting: Neurology

## 2012-04-04 DIAGNOSIS — I6200 Nontraumatic subdural hemorrhage, unspecified: Secondary | ICD-10-CM

## 2012-04-04 DIAGNOSIS — I10 Essential (primary) hypertension: Secondary | ICD-10-CM

## 2012-04-04 LAB — CBC
MCHC: 33.9 g/dL (ref 30.0–36.0)
Platelets: 178 10*3/uL (ref 150–400)
RDW: 12.7 % (ref 11.5–15.5)
WBC: 6.7 10*3/uL (ref 4.0–10.5)

## 2012-04-04 LAB — BASIC METABOLIC PANEL
BUN: 15 mg/dL (ref 6–23)
Creatinine, Ser: 0.75 mg/dL (ref 0.50–1.35)
GFR calc non Af Amer: 89 mL/min — ABNORMAL LOW (ref >90)
Glucose, Bld: 125 mg/dL — ABNORMAL HIGH (ref 70–99)
Potassium: 3.4 mEq/L — ABNORMAL LOW (ref 3.5–5.1)

## 2012-04-04 LAB — COMPREHENSIVE METABOLIC PANEL
ALT: 12 U/L (ref 0–53)
Alkaline Phosphatase: 65 U/L (ref 39–117)
BUN: 14 mg/dL (ref 6–23)
CO2: 27 mEq/L (ref 19–32)
Chloride: 103 mEq/L (ref 96–112)
GFR calc Af Amer: >90 mL/min (ref >90)
GFR calc non Af Amer: 85 mL/min — ABNORMAL LOW (ref >90)
Glucose, Bld: 133 mg/dL — ABNORMAL HIGH (ref 70–99)
Potassium: 4.1 mEq/L (ref 3.5–5.1)
Sodium: 139 mEq/L (ref 135–145)
Total Bilirubin: 0.5 mg/dL (ref 0.3–1.2)

## 2012-04-04 LAB — PROTIME-INR: INR: 1.04 (ref 0.00–1.49)

## 2012-04-04 LAB — APTT: aPTT: 26 seconds (ref 24–37)

## 2012-04-04 MED ORDER — ACETAMINOPHEN 650 MG RE SUPP: 650.0000 mg | RECTAL | Status: DC | PRN

## 2012-04-04 MED ORDER — DEXTROSE-NACL 5-0.45 % IV SOLN: INTRAVENOUS | Status: DC

## 2012-04-04 MED ORDER — SODIUM CHLORIDE 0.9 % IJ SOLN
3.0000 mL | Freq: Two times a day (BID) | INTRAMUSCULAR | Status: DC
  Administered 2012-04-04 – 2012-04-05 (×4): 3 mL via INTRAVENOUS

## 2012-04-04 MED ORDER — PROMETHAZINE HCL 25 MG PO TABS: 12.5000 mg | ORAL_TABLET | ORAL | Status: DC | PRN

## 2012-04-04 MED ORDER — ONDANSETRON HCL 4 MG PO TABS: 4.0000 mg | ORAL_TABLET | ORAL | Status: DC | PRN

## 2012-04-04 MED ORDER — POTASSIUM CHLORIDE IN NACL 20-0.9 MEQ/L-% IV SOLN
INTRAVENOUS | Status: DC
  Administered 2012-04-04: 75 mL/h via INTRAVENOUS
  Filled 2012-04-04 (×2): qty 1000

## 2012-04-04 MED ORDER — DOCUSATE SODIUM 100 MG PO CAPS
100.0000 mg | ORAL_CAPSULE | Freq: Two times a day (BID) | ORAL | Status: DC
  Administered 2012-04-04 – 2012-04-05 (×4): 100 mg via ORAL
  Filled 2012-04-04 (×6): qty 1

## 2012-04-04 MED ORDER — HYDROCODONE-ACETAMINOPHEN 5-325 MG PO TABS
1.0000 | ORAL_TABLET | ORAL | Status: DC | PRN
  Administered 2012-04-04 (×2): 1 via ORAL
  Filled 2012-04-04 (×2): qty 1

## 2012-04-04 MED ORDER — ONDANSETRON HCL 4 MG/2ML IJ SOLN: 4.0000 mg | INTRAMUSCULAR | Status: DC | PRN

## 2012-04-04 MED ORDER — POTASSIUM CHLORIDE CRYS ER 20 MEQ PO TBCR
20.0000 meq | EXTENDED_RELEASE_TABLET | Freq: Once | ORAL | Status: AC
  Administered 2012-04-04: 20 meq via ORAL
  Filled 2012-04-04: qty 1

## 2012-04-04 MED ORDER — LABETALOL HCL 5 MG/ML IV SOLN
10.0000 mg | INTRAVENOUS | Status: DC | PRN
  Administered 2012-04-05 (×3): 20 mg via INTRAVENOUS
  Filled 2012-04-04 (×3): qty 4

## 2012-04-04 MED ORDER — MORPHINE SULFATE 2 MG/ML IJ SOLN: 1.0000 mg | INTRAMUSCULAR | Status: DC | PRN

## 2012-04-04 MED ORDER — SENNA 8.6 MG PO TABS
1.0000 | ORAL_TABLET | Freq: Two times a day (BID) | ORAL | Status: DC
  Administered 2012-04-04 – 2012-04-05 (×4): 8.6 mg via ORAL
  Filled 2012-04-04 (×6): qty 1

## 2012-04-04 MED ORDER — AMLODIPINE BESYLATE 5 MG PO TABS
5.0000 mg | ORAL_TABLET | Freq: Every day | ORAL | Status: DC
  Administered 2012-04-04 – 2012-04-05 (×2): 5 mg via ORAL
  Filled 2012-04-04 (×3): qty 1

## 2012-04-04 MED ORDER — PANTOPRAZOLE SODIUM 40 MG PO TBEC
40.0000 mg | DELAYED_RELEASE_TABLET | Freq: Every day | ORAL | Status: DC
  Administered 2012-04-04 – 2012-04-05 (×2): 40 mg via ORAL
  Filled 2012-04-04 (×2): qty 1

## 2012-04-04 MED ORDER — HYDROCODONE-ACETAMINOPHEN 5-325 MG PO TABS
1.0000 | ORAL_TABLET | ORAL | Status: DC | PRN
  Administered 2012-04-04 – 2012-04-05 (×3): 2 via ORAL
  Filled 2012-04-04 (×3): qty 2

## 2012-04-04 MED ORDER — ACETAMINOPHEN 325 MG PO TABS: 650.0000 mg | ORAL_TABLET | ORAL | Status: DC | PRN

## 2012-04-04 MED ORDER — SODIUM CHLORIDE 0.9 % IV SOLN
500.0000 mg | Freq: Two times a day (BID) | INTRAVENOUS | Status: DC
  Filled 2012-04-04: qty 5

## 2012-04-04 MED ORDER — FLEET ENEMA 7-19 GM/118ML RE ENEM: 1.0000 | ENEMA | Freq: Once | RECTAL | Status: AC | PRN

## 2012-04-04 MED ORDER — SODIUM CHLORIDE 0.9 % IV SOLN
500.0000 mg | INTRAVENOUS | Status: AC
  Administered 2012-04-04: 500 mg via INTRAVENOUS
  Filled 2012-04-04: qty 5

## 2012-04-04 MED ORDER — CEFAZOLIN SODIUM-DEXTROSE 2-3 GM-% IV SOLR
2.0000 g | Freq: Three times a day (TID) | INTRAVENOUS | Status: AC
  Administered 2012-04-04 (×2): 2 g via INTRAVENOUS
  Filled 2012-04-04 (×2): qty 50

## 2012-04-04 MED ORDER — BISACODYL 10 MG RE SUPP
10.0000 mg | Freq: Every day | RECTAL | Status: DC | PRN
Start: 2012-04-04 — End: 2012-04-06

## 2012-04-04 MED ORDER — PANTOPRAZOLE SODIUM 40 MG IV SOLR
40.0000 mg | Freq: Every day | INTRAVENOUS | Status: DC
  Administered 2012-04-04: 40 mg via INTRAVENOUS
  Filled 2012-04-04 (×2): qty 40

## 2012-04-04 MED ORDER — ALLOPURINOL 300 MG PO TABS
300.0000 mg | ORAL_TABLET | Freq: Every day | ORAL | Status: DC
  Administered 2012-04-04 – 2012-04-05 (×2): 300 mg via ORAL
  Filled 2012-04-04 (×3): qty 1

## 2012-04-04 MED ORDER — POLYETHYLENE GLYCOL 3350 17 G PO PACK: 17.0000 g | PACK | Freq: Every day | ORAL | Status: DC | PRN

## 2012-04-04 NOTE — Progress Notes (Signed)
Subjective: Patient reports doing very well.  Memory , speech and hemiparesis improved.  Objective: Vital signs in last 24 hours: Temp:  [97.1 F (36.2 C)-98.5 F (36.9 C)] 97.6 F (36.4 C) (10/12 0000) Pulse Rate:  [80-104] 82  (10/12 0600) Resp:  [13-22] 13  (10/12 0600) BP: (110-180)/(60-97) 110/64 mmHg (10/12 0600) SpO2:  [93 %-100 %] 95 % (10/12 0600) Arterial Line BP: (180-191)/(72-79) 180/72 mmHg (10/11 2300) Weight:  [108.6 kg (239 lb 6.7 oz)] 108.6 kg (239 lb 6.7 oz) (10/12 0000)  Intake/Output from previous day: 10/11 0701 - 10/12 0700 In: 1465 [I.V.:1350; IV Piggyback:115] Out: 521 [Urine:351; Drains:120; Blood:50] Intake/Output this shift:    Physical Exam: No drift.  Speech clear and fluent.  No weakness.  Drain serosanguinous moderate output.  Lab Results:  [facility] 04/03/12 1622  WBC 7.0  HGB 15.1  HCT 43.7  PLT 197   BMET  Basename 04/04/12 0029 04/03/12 1622  NA 139 139  K 3.4* 3.9  CL 106 100  CO2 22 24  GLUCOSE 125* 100*  BUN 15 17  CREATININE 0.75 0.85  CALCIUM 8.1* 9.8    Studies/Results: Ct Head Wo Contrast  04/03/2012  *RADIOLOGY REPORT*  Clinical Data: Confusion.  Not using the right extremities.  CT HEAD WITHOUT CONTRAST  Technique:  Contiguous axial images were obtained from the base of the skull through the vertex without contrast.  Comparison: Multiple exams, including 10/02/2010 and 10/27/2007  Findings: Mixed density large left complex extra-axial fluid collection likely represents a subacute subdural hematoma with some acute components.  It is larger near the vertex measures up to 3.4 cm in thickness, and associated with 11 mm of left right midline shift.  A smaller right subacute subdural hematoma measures 7 mm in thickness.  There is effacement of left-sided cerebral sulci and mild effacement of the left lateral ventricle.  The brain stem, cerebellum, and basal ganglia appear intact.  No intraventricular hemorrhage is observed.  No  overt transtentorial or uncal herniation.  No mass in the brain is noted.  No findings of acute CVA.  No skull fracture observed.  IMPRESSION:  1.  Mixed density large acute/subacute left frontoparietal subdural hematoma. 11 mm of left to right midline shift. 2.  Small right sided subdural hematoma near the vertex. 3.  Neurosurgical consultation recommended.  Critical Value/emergent results were called by telephone to Remi Haggard (who verbally acknowledged these results) at the time of interpretation on 04/03/2012 at 5:50 p.m.   Original Report Authenticated By: Dellia Cloud, M.D.     Assessment/Plan: Doing well following craniotomy for subdural.  Continue drain, up to chair.  Regular diet.    LOS: 1 day    Dorian Heckle, MD 04/04/2012, 7:17 AM

## 2012-04-04 NOTE — Consult Note (Signed)
Name: [name] MRN: [id number] DOB: 1940/07/25    LOS: 1  Referring Provider:  Dr. Venetia Maxon Reason for Referral:  Medical management  PULMONARY / CRITICAL CARE MEDICINE  HPI:  71 y/o M presented to the ED with right sided weakness and confusion.  His wife reported ~ 1 week of dragging his right foot and worsening memory.  Today confusion and right sided weakness worsened and he was brought to the ED.  Was noted to have RUE and RLE weakness, aphasia and right sided neglect.  CT head showed a small right subdural, large acute on chronic left subdural and left to right shift.  He was brought to the OR for craniotomy and evacuation of the hematoma.  Post-op he was extubated and brought to the ICU for monitoring.  Neurologic symptoms are much improved post operatively.  He currently feels well except for a mild headache. He denies any recent trauma.  Past Medical History  Diagnosis Date  . Gout   . GERD (gastroesophageal reflux disease)   . Barrett's esophagus 2009  . FH: colonic polyps   . Arthritis   . Hypertension   . Prostate cancer 12/2011    Found on routine screening PSA, planning for implants by Dr. Roselind Messier 10/16   . Pulmonary embolus 2005    Bilateral, coumadin x 1 years, inpatient for 5 days   Past Surgical History  Procedure Date  . Fracture left femur repair 09/2010    Went to [facility] x 3.5 weeks  . Tonsillectomy     as child.   Prior to Admission medications   Medication Sig Start Date End Date Taking? Authorizing Provider  allopurinol (ZYLOPRIM) 300 MG tablet Take 300 mg by mouth daily.   Yes Historical Provider, MD  amLODipine (NORVASC) 5 MG tablet Take 1 tablet (5 mg total) by mouth daily. 03/30/12 03/30/13 Yes [name], MD  aspirin 81 MG tablet Take 81 mg by mouth daily.   Yes Historical Provider, MD  omeprazole (PRILOSEC) 20 MG capsule Take 20 mg by mouth daily.   Yes Historical Provider, MD   Allergies No Known Allergies  Family History Family  History  Problem Relation Age of Onset  . Heart disease      No family history  . Macular degeneration Mother   . Osteoarthritis Mother   . Aneurysm Father     d/o 77 yo, ruptured AAA   Social History  reports that he quit smoking about 40 years ago. His smoking use included Cigarettes. He has a 20 pack-year smoking history. He does not have any smokeless tobacco history on file. He reports that he drinks alcohol. He reports that he does not use illicit drugs.  Review Of Systems:  Review of 12 systems negative except as listed in HPI  Brief patient description:  71 yo M with with left subdural hematoma s/p craniotomy and hematoma evacuation on  10/11.  Events Since Admission: 10/11 craniotomy  Current Status:  Vital Signs: Temp:  [97.1 F (36.2 C)-98.5 F (36.9 C)] 97.6 F (36.4 C) (10/12 0000) Pulse Rate:  [80-104] 88  (10/12 0000) Resp:  [14-22] 18  (10/12 0000) BP: (143-180)/(80-97) 143/83 mmHg (10/12 0000) SpO2:  [93 %-100 %] 93 % (10/12 0000) Arterial Line BP: (180-191)/(72-79) 180/72 mmHg (10/11 2300) Weight:  [108.6 kg (239 lb 6.7 oz)] 108.6 kg (239 lb 6.7 oz) (10/12 0000)  Physical Examination: General:  NAB, lying in bed Neuro:  Awake, alert, oriented x 3.  Moving all 4  extremities, minimally decreased RU and RL extremity weakness compared to left. HEENT:  S/p left crani with dressing in place, PERRL, EOMI, MMM Neck:  Supple, no cervical or supraclavicular LAD, no thyromegaly Cardiovascular:  RRR, no m/r/g Lungs:  CTAB, nl WOB Abdomen:  Soft, NT, ND, nl BS Musculoskeletal:  Joints wnl Skin:  No rash or lesion    ASSESSMENT AND PLAN  PULMONARY  A:  NO issues, on 2L post op P:   - Wean O2 - Incentive spirometry  CARDIOVASCULAR  Lab 04/03/12 1626 03/30/12 1245  TROPONINI <0.30 --  LATICACIDVEN -- --  PROBNP -- 31.0   Lines: PIV, aline  A: Hypertension, recently started on norvasc P:  - Labetolol prn - Restart norvasc if remaining  stable  RENAL  Lab 04/03/12 1622  NA 139  K 3.9  CL 100  CO2 24  BUN 17  CREATININE 0.85  CALCIUM 9.8  MG --  PHOS --   Intake/Output      10/11 0701 - 10/12 0700   I.V. (mL/kg) 900 (8.3)   Total Intake(mL/kg) 900 (8.3)   Urine (mL/kg/hr) 200 (0.1)   Drains 90   Blood 50   Total Output 340   Net +560         A:  No issues P:   -Monitor UOP and lytes - IVF at 74 post of until tolerating diet  GASTROINTESTINAL  Lab 04/03/12 1622  AST 16  ALT 17  ALKPHOS 74  BILITOT 0.7  PROT 7.8  ALBUMIN 4.2    A: GERD P:   Advance diet as tolerated Cont home PPI   HEMATOLOGIC  Lab 04/03/12 1622  HGB 15.1  HCT 43.7  PLT 197  INR --  APTT --   A:  No issues P:  - Will check coags now to ensure coagulopathy not contributor to bleed  INFECTIOUS  Lab 04/03/12 1622  WBC 7.0  PROCALCITON --   Cultures: N/A Antibiotics: Ancef x 2 doses  A:  No evidence of infection P:   - Perioperative Ancef  ENDOCRINE No results found for this basename: GLUCAP:5 in the last 168 hours A:  No issues  NEUROLOGIC  A:  SDH s/p crani and evacuation P:   - Post op management per neurosurgery - Keppra per neurosurgery  BEST PRACTICE / DISPOSITION Level of Care:  ICU Primary Service:  Neurosurgery Consultants:  PCCM Code Status:  Full Diet:  Advance as tolerated DVT Px: SCDs  GI Px:  Home PPI Skin Integrity:  No issues Social / Family:  Family not present.  Recently went home after surgery.  [name], M.D. Pulmonary and Critical Care Medicine [facility] Pager: [phone]  04/04/2012, 12:24 AM

## 2012-04-04 NOTE — ED Provider Notes (Signed)
Medical screening examination/treatment/procedure(s) were conducted as a shared visit with non-physician practitioner(s) and myself.  I personally evaluated the patient during the encounter Patient with gradually progressive worsening neurologic deficits that have been ongoing for the last week. Patient denies any trauma but does have some hemi-neglect on exam. CT shows bilateral subdural hematoma with shift present. Patient was admitted for further care and neurosurgery consult  Gwyneth Sprout, MD 04/04/12 0001

## 2012-04-04 NOTE — Progress Notes (Addendum)
Name: [name] MRN: [id number] DOB: 1941-03-27    LOS: 1  Referring Provider:  Dr. Venetia Maxon Reason for Referral:  Medical management   PCP:  Dr. Renne Crigler  PULMONARY / CRITICAL CARE MEDICINE  Brief Summary:   71 y/o M presented to the ED with right sided weakness and confusion.  His wife reported ~ 1 week of dragging his right foot and worsening memory.  10/11 confusion and right sided weakness worsened and he was brought to the ED.  Was noted to have RUE and RLE weakness, aphasia and right sided neglect.  CT head showed a small right subdural, large acute on chronic left subdural and left to right shift.  He was brought to the OR for craniotomy and evacuation of the hematoma.  Post-op he was extubated and brought to the ICU for monitoring.  Neurologic symptoms are much improved post operatively.      Events Since Admission: 10/11 - Admit, craniotomy 10/12 - improved, no distress  Current Status: no distress, eating breakfast, feels much better, asking to go home  Vital Signs: Temp:  [97.1 F (36.2 C)-98.5 F (36.9 C)] 97.7 F (36.5 C) (10/12 0824) Pulse Rate:  [71-104] 71  (10/12 1000) Resp:  [13-22] 16  (10/12 1000) BP: (110-180)/(60-97) 129/73 mmHg (10/12 1000) SpO2:  [93 %-100 %] 96 % (10/12 1000) Arterial Line BP: (180-191)/(72-79) 180/72 mmHg (10/11 2300) Weight:  [239 lb 6.7 oz (108.6 kg)] 239 lb 6.7 oz (108.6 kg) (10/12 0000)  Physical Examination: General:  NAB, lying in bed Neuro:  Awake, alert, oriented x 3.  Moving all 4 extremities, minimally decreased RU and RL extremity weakness compared to left.  HEENT:  S/p left crani with dressing in place, JP drain intact, PERRL, EOMI, MMM Neck:  Supple, no cervical or supraclavicular LAD, no thyromegaly Cardiovascular:  RRR, no m/r/g Lungs:  CTAB, nl WOB Abdomen:  Soft, NT, ND, nl BS Musculoskeletal:  Joints wnl Skin:  No rash or lesion    ASSESSMENT AND PLAN  PULMONARY  A:  NO issues, on 2L post op P:   - Wean  O2 - Incentive spirometry  CARDIOVASCULAR  Lab 04/03/12 1626 03/30/12 1245  TROPONINI <0.30 --  LATICACIDVEN -- --  PROBNP -- 31.0   Lines: PIV, aline  A: Hypertension, recently started on norvasc P:  - Labetolol prn - Restart home norvasc   RENAL  Lab 04/04/12 0029 04/03/12 1622  NA 139 139  K 3.4* 3.9  CL 106 100  CO2 22 24  BUN 15 17  CREATININE 0.75 0.85  CALCIUM 8.1* 9.8  MG -- --  PHOS -- --   Intake/Output      10/11 0701 - 10/12 0700 10/12 0701 - 10/13 0700   I.V. (mL/kg) 1425 (13.1) 150 (1.4)   IV Piggyback 115    Total Intake(mL/kg) 1540 (14.2) 150 (1.4)   Urine (mL/kg/hr) 351 (0.1)    Drains 120    Blood 50    Total Output 521    Net +1019 +150          A:   Mild hypokalemia  P:   -Monitor UOP and lytes -tolerating diet, SL IVF - K 10/12 oral  GASTROINTESTINAL  Lab 04/03/12 1622  AST 16  ALT 17  ALKPHOS 74  BILITOT 0.7  PROT 7.8  ALBUMIN 4.2    A: GERD P:   Advance diet as tolerated Cont home PPI   HEMATOLOGIC  Lab 04/03/12 1622  HGB 15.1  HCT  43.7  PLT 197  INR --  APTT --   A:  No issues P:  - Will check coags now to ensure coagulopathy not contributor to bleed  INFECTIOUS  Lab 04/03/12 1622  WBC 7.0  PROCALCITON --   Cultures: N/A Antibiotics: Ancef x 2 doses  A:  No evidence of infection P:   - Perioperative Ancef  ENDOCRINE  Lab 04/04/12 0735  GLUCAP 122*   A:  No issues  NEUROLOGIC  A:  SDH s/p crani and evacuation  P:   - Post op management per neurosurgery - Keppra per neurosurgery   BEST PRACTICE / DISPOSITION Level of Care:  ICU Primary Service: PCCM  Consultants: NSGY Code Status:  Full Diet:  Advance as tolerated DVT Px: SCDs  GI Px:  Home PPI Skin Integrity:  No issues Social / Family:  WIfe   Canary Brim, NP-C [place] [facility] Pgr: [phone] or [phone]   04/04/2012, 10:14 AM   Attending Addendum:  I have seen the patient, discussed the  issues, test results and plans with B. Veleta Miners, NP. I agree with the Assessment and Plans as ammended above.   Levy Pupa, MD, PhD 04/04/2012, 11:03 AM [place] [facility] [phone] or if no answer [phone]

## 2012-04-05 LAB — CBC
HCT: 39.6 % (ref 39.0–52.0)
Hemoglobin: 13.6 g/dL (ref 13.0–17.0)
MCH: 32.5 pg (ref 26.0–34.0)
MCHC: 34.3 g/dL (ref 30.0–36.0)
RDW: 12.9 % (ref 11.5–15.5)

## 2012-04-05 LAB — BASIC METABOLIC PANEL
BUN: 19 mg/dL (ref 6–23)
Creatinine, Ser: 0.8 mg/dL (ref 0.50–1.35)
GFR calc non Af Amer: 87 mL/min — ABNORMAL LOW (ref >90)
Glucose, Bld: 104 mg/dL — ABNORMAL HIGH (ref 70–99)
Potassium: 4 mEq/L (ref 3.5–5.1)

## 2012-04-05 LAB — GLUCOSE, CAPILLARY: Glucose-Capillary: 120 mg/dL — ABNORMAL HIGH (ref 70–99)

## 2012-04-05 NOTE — Progress Notes (Signed)
Attempted to place foley catheter. Unable to place due to pt anatomy. Urethra below meatus and unable to pass catheter. Barbera Setters

## 2012-04-05 NOTE — Progress Notes (Signed)
Name: [name] MRN: [id number] DOB: 08/11/1940    LOS: 2  Referring Provider:  Dr. Venetia Maxon Reason for Referral:  Medical management   PCP:  Dr. Renne Crigler  PULMONARY / CRITICAL CARE MEDICINE  Brief Summary:   71 y/o M presented to the ED with right sided weakness and confusion.  His wife reported ~ 1 week of dragging his right foot and worsening memory.  10/11 confusion and right sided weakness worsened and he was brought to the ED.  Was noted to have RUE and RLE weakness, aphasia and right sided neglect.  CT head showed a small right subdural, large acute on chronic left subdural and left to right shift.  He was brought to the OR for craniotomy and evacuation of the hematoma.  Post-op he was extubated and brought to the ICU for monitoring.  Neurologic symptoms are much improved post operatively.      Events Since Admission: 10/11 - Admit, craniotomy 10/12 - improved, no distress   Current Status:  AAO, no acute distress, up in bed. Had an episode urinary incontinence   Vital Signs: Temp:  [97.4 F (36.3 C)-98.6 F (37 C)] 97.9 F (36.6 C) (10/13 0800) Pulse Rate:  [65-103] 86  (10/13 0800) Resp:  [12-22] 14  (10/13 0800) BP: (102-181)/(59-99) 181/89 mmHg (10/13 0800) SpO2:  [93 %-98 %] 97 % (10/13 0800)  Physical Examination: General:  NAB, lying in bed Neuro:  Awake, alert, oriented x 3.  Moving all 4 extremities, minimally decreased RU and RL extremity weakness compared to left.  HEENT:  S/p left crani with dressing in place, JP drain intact, PERRL, EOMI, MMM Neck:  Supple, no cervical or supraclavicular LAD, no thyromegaly Cardiovascular:  RRR, no m/r/g Lungs:  CTAB, nl WOB Abdomen:  Soft, NT, ND, nl BS Musculoskeletal:  Joints wnl Skin:  No rash or lesion    ASSESSMENT AND PLAN  PULMONARY  A:  No issues, on 2L post op P:   - weaned off O2 10/13 - Incentive spirometry  CARDIOVASCULAR  Lab 04/03/12 1626 03/30/12 1245  TROPONINI <0.30 --  LATICACIDVEN -- --   PROBNP -- 31.0   Lines: PIV, aline  A: Hypertension, recently started on norvasc P:  - Labetolol prn - Restart home norvasc  - follow BP trend  RENAL  Lab 04/05/12 0538 04/04/12 1228 04/04/12 0029 04/03/12 1622  NA 136 139 139 139  K 4.0 4.1 -- --  CL 102 103 106 100  CO2 25 27 22 24   BUN 19 14 15 17   CREATININE 0.80 0.85 0.75 0.85  CALCIUM 8.9 8.7 8.1* 9.8  MG -- -- -- --  PHOS -- -- -- --   Intake/Output      10/12 0701 - 10/13 0700 10/13 0701 - 10/14 0700   P.O. 780    I.V. (mL/kg) 225 (2.1)    IV Piggyback 100    Total Intake(mL/kg) 1105 (10.2)    Urine (mL/kg/hr) 825 (0.3)    Drains 25    Blood     Total Output 850    Net +255         Urine Occurrence 1 x      A:   Mild hypokalemia -resolved Episode of urinary incontinence, ? BPH  P:   -Monitor UOP and lytes -follow UOP without foley, may need more thorough urology w/u if reoccurs -tolerating diet, SL IVF   GASTROINTESTINAL  Lab 04/04/12 1228 04/03/12 1622  AST 15 16  ALT 12 17  ALKPHOS 65 74  BILITOT 0.5 0.7  PROT 6.5 7.8  ALBUMIN 3.4* 4.2    A: GERD P:   Advance diet as tolerated Cont home PPI   HEMATOLOGIC  Lab 04/05/12 0538 04/04/12 1228 04/03/12 1622  HGB 13.6 14.0 15.1  HCT 39.6 41.3 43.7  PLT 177 178 197  INR -- 1.04 --  APTT -- 26 --   A:  No issues P:  - Will check coags now to ensure coagulopathy not contributor to bleed  INFECTIOUS  Lab 04/05/12 0538 04/04/12 1228 04/03/12 1622  WBC 7.3 6.7 7.0  PROCALCITON -- -- --   Cultures: N/A Antibiotics: Ancef x 2 doses  A:  No evidence of infection P:   - Perioperative Ancef  ENDOCRINE  Lab 04/05/12 0841 04/04/12 0735  GLUCAP 120* 122*   A:  No issues  NEUROLOGIC  A:  SDH s/p crani and evacuation  P:   - Post op management per neurosurgery - Keppra per neurosurgery   BEST PRACTICE / DISPOSITION Level of Care:  ICU Primary Service: TRH-->PCCM.   Consultants: NSGY Code Status:  Full Diet:  Advance  as tolerated DVT Px: SCDs  GI Px:  Home PPI Skin Integrity:  No issues Social / Family:  WIfe   Canary Brim, NP-C [place] [facility] Pgr: [phone] or [phone]  Levy Pupa, MD, PhD 04/05/2012, 11:36 AM [place] [facility] [phone] or if no answer [phone]

## 2012-04-05 NOTE — Progress Notes (Signed)
Subjective: Patient reports feeling good  Objective: Vital signs in last 24 hours: Temp:  [97.4 F (36.3 C)-98.6 F (37 C)] 97.4 F (36.3 C) (10/13 0225) Pulse Rate:  [65-103] 86  (10/13 0800) Resp:  [12-22] 14  (10/13 0800) BP: (113-181)/(59-99) 181/89 mmHg (10/13 0800) SpO2:  [93 %-98 %] 97 % (10/13 0800)  Intake/Output from previous day: 10/12 0701 - 10/13 0700 In: 1105 [P.O.:780; I.V.:225; IV Piggyback:100] Out: 740 [Urine:725; Drains:15] Intake/Output this shift:    Physical Exam: No drift, no speech difficulty.  Memory improving.  Dressing CDI.  Serosanguinous drainage in tubing.  Lab Results:  Basename 04/05/12 0538 04/04/12 1228  WBC 7.3 6.7  HGB 13.6 14.0  HCT 39.6 41.3  PLT 177 178   BMET  Basename 04/05/12 0538 04/04/12 1228  NA 136 139  K 4.0 4.1  CL 102 103  CO2 25 27  GLUCOSE 104* 133*  BUN 19 14  CREATININE 0.80 0.85  CALCIUM 8.9 8.7    Studies/Results: Ct Head Wo Contrast  04/03/2012  *RADIOLOGY REPORT*  Clinical Data: Confusion.  Not using the right extremities.  CT HEAD WITHOUT CONTRAST  Technique:  Contiguous axial images were obtained from the base of the skull through the vertex without contrast.  Comparison: Multiple exams, including 10/02/2010 and 10/27/2007  Findings: Mixed density large left complex extra-axial fluid collection likely represents a subacute subdural hematoma with some acute components.  It is larger near the vertex measures up to 3.4 cm in thickness, and associated with 11 mm of left right midline shift.  A smaller right subacute subdural hematoma measures 7 mm in thickness.  There is effacement of left-sided cerebral sulci and mild effacement of the left lateral ventricle.  The brain stem, cerebellum, and basal ganglia appear intact.  No intraventricular hemorrhage is observed.  No overt transtentorial or uncal herniation.  No mass in the brain is noted.  No findings of acute CVA.  No skull fracture observed.  IMPRESSION:  1.   Mixed density large acute/subacute left frontoparietal subdural hematoma. 11 mm of left to right midline shift. 2.  Small right sided subdural hematoma near the vertex. 3.  Neurosurgical consultation recommended.  Critical Value/emergent results were called by telephone to Remi Haggard (who verbally acknowledged these results) at the time of interpretation on 04/03/2012 at 5:50 p.m.   Original Report Authenticated By: Dellia Cloud, M.D.     Assessment/Plan: Improving following craniotomy for SDH.  Continue drain today.  Anticipate removal in AM after Head CT.    LOS: 2 days    Dorian Heckle, MD 04/05/2012, 8:39 AM

## 2012-04-05 NOTE — Consult Note (Signed)
Urology Consult  Requesting Physician: Dr. Cyril Mourning  CC: Urinary retention  HPI: 71 year old male admitted for subdural hematoma. Urology consulted for urinary retention. His last void was this morning. He had been having small voids of 50cc.  Negative gross hematuria or dysuria. Nothing makes it better or worse. This has never happened before.    He has a past history of prostate cancer that has not been treated. This is managed by my colleague, Dr. Annabell Howells. The patient was scheduled to have gold seed implant in preparation for radiation treatment of the prostate later this week. He has subcoronal hypospadias.  PMH: Past Medical History  Diagnosis Date  . Gout   . GERD (gastroesophageal reflux disease)   . Barrett's esophagus 2009  . FH: colonic polyps   . Arthritis   . Hypertension   . Prostate cancer 12/2011    Found on routine screening PSA, planning for implants by Dr. Roselind Messier 10/16   . Pulmonary embolus 2005    Bilateral, coumadin x 1 years, inpatient for 5 days    PSH: Past Surgical History  Procedure Date  . Fracture left femur repair 09/2010    Went to [facility] x 3.5 weeks  . Tonsillectomy     as child.    Allergies: No Known Allergies  Medications: Prescriptions prior to admission  Medication Sig Dispense Refill  . allopurinol (ZYLOPRIM) 300 MG tablet Take 300 mg by mouth daily.      Marland Kitchen amLODipine (NORVASC) 5 MG tablet Take 1 tablet (5 mg total) by mouth daily.  30 tablet  11  . aspirin 81 MG tablet Take 81 mg by mouth daily.      Marland Kitchen omeprazole (PRILOSEC) 20 MG capsule Take 20 mg by mouth daily.         Social History: History   Social History  . Marital Status: Married    Spouse Name: N/A    Number of Children: 3  . Years of Education: N/A   Occupational History  .      Quarry manager   Social History Main Topics  . Smoking status: Former Smoker -- 1.0 packs/day for 20 years    Types: Cigarettes    Quit date: 12/23/1971  . Smokeless  tobacco: Not on file  . Alcohol Use: 0.0 oz/week     2 drinks per night, last drink was 03/29/2012  . Drug Use: No  . Sexually Active: Not on file   Other Topics Concern  . Not on file   Social History Narrative  . No narrative on file    Family History: Family History  Problem Relation Age of Onset  . Heart disease      No family history  . Macular degeneration Mother   . Osteoarthritis Mother   . Aneurysm Father     d/o 37 yo, ruptured AAA    Review of Systems: Positive: Constipation. Negative: Fever, chills, chest pain.  A further 10 point review of systems was negative except what is listed in the HPI.  Physical Exam: Filed Vitals:   04/05/12 2200  BP: 138/65  Pulse: 76  Temp:   Resp: 14    General: No acute distress.  Awake. Head:  Normocephalic.  Atraumatic. ENT:  EOMI.  Mucous membranes moist Neck:  Supple.  No lymphadenopathy. Pulmonary: Equal effort bilaterally.  Clear to auscultation bilaterally. Abdomen: Soft.  Non- tender to palpation. Skin:  Normal turgor.  No visible rash. Extremity: No gross deformity of bilateral upper  extremities.  No gross deformity of    bilateral lower extremities. Neurologic: Alert. Appropriate mood.  Penis:  Circumcised.  No lesions. Urethra: No Foley catheter in place.  Subcoronal hypospadias. Scrotum: No lesions.  No ecchymosis.  No erythema. Testicles: Descended bilaterally.  No masses bilaterally. Epididymis: Palpable bilaterally.  Non Tender to palpation.  Studies:  Recent Labs  [facility] [place] 04/05/12 0538 04/04/12 1228   HGB 13.6 14.0   WBC 7.3 6.7   PLT 177 178    Recent Labs  Basename 04/05/12 0538 04/04/12 1228   NA 136 139   K 4.0 4.1   CL 102 103   CO2 25 27   BUN 19 14   CREATININE 0.80 0.85   CALCIUM 8.9 8.7   GFRNONAA 87* 85*   GFRAA >90 >90     Recent Labs  Basename 04/04/12 1228   INR 1.04   APTT 26     No components found with this basename: ABG:2    Assessment:  Urinary retention  following evacuation of subdural hematoma. Hypospadias.  Plan: -I placed a Foley catheter placed using sterile technique with ease. -Given the ease of placement, it is ok for the primary team to discontinue once the patient has begun to recover better from his surgery. -I will notify his urologist, Dr. Annabell Howells, as the patient's gold seeds will need to be rescheduled.  The patient can follow up with Dr. Annabell Howells for further management of his prostate cancer as an outpatient.    Pager: [phone]

## 2012-04-05 NOTE — Progress Notes (Signed)
eLink Physician-Brief Progress Note Patient Name: [name] DOB: 1940-12-27 MRN: [id number]  Date of Service  04/05/2012   HPI/Events of Note     eICU Interventions  Called urology for retention, 600cc on bladder scan, RNs unable to place foley, coude H/o prostate CA   Intervention Category Minor Interventions: Communication with other healthcare providers and/or family  Nalia Honeycutt V. 04/05/2012, 10:42 PM

## 2012-04-06 ENCOUNTER — Inpatient Hospital Stay: Admission: RE | Admit: 2012-04-06 | Payer: Medicare Other | Source: Ambulatory Visit

## 2012-04-06 ENCOUNTER — Telehealth: Payer: Self-pay | Admitting: Cardiology

## 2012-04-06 ENCOUNTER — Encounter (HOSPITAL_COMMUNITY): Payer: Self-pay | Admitting: Radiology

## 2012-04-06 ENCOUNTER — Encounter: Payer: Self-pay | Admitting: Cardiology

## 2012-04-06 ENCOUNTER — Inpatient Hospital Stay (HOSPITAL_COMMUNITY): Payer: Medicare Other

## 2012-04-06 LAB — GLUCOSE, CAPILLARY: Glucose-Capillary: 104 mg/dL — ABNORMAL HIGH (ref 70–99)

## 2012-04-06 NOTE — Progress Notes (Signed)
Subjective: Patient reports "I had a terrible headache before, but its almost gone now"  Objective: Vital signs in last 24 hours: Temp:  [97.9 F (36.6 C)-99.6 F (37.6 C)] 98.5 F (36.9 C) (10/14 0330) Pulse Rate:  [64-105] 70  (10/14 0700) Resp:  [13-21] 17  (10/14 0700) BP: (125-186)/(58-117) 136/66 mmHg (10/14 0700) SpO2:  [90 %-99 %] 94 % (10/14 0700) Weight:  [110.4 kg (243 lb 6.2 oz)] 110.4 kg (243 lb 6.2 oz) (10/14 0422)  Intake/Output from previous day: 10/13 0701 - 10/14 0700 In: 680 [P.O.:680] Out: 2615 [Urine:2600; Drains:15] Intake/Output this shift:    Alert, conversant. Reports only mild h/a. PEARL. No drift. JP=33ml overnight. drsg intact.  Lab Results:  Basename 04/05/12 0538 04/04/12 1228  WBC 7.3 6.7  HGB 13.6 14.0  HCT 39.6 41.3  PLT 177 178   BMET  Basename 04/05/12 0538 04/04/12 1228  NA 136 139  K 4.0 4.1  CL 102 103  CO2 25 27  GLUCOSE 104* 133*  BUN 19 14  CREATININE 0.80 0.85  CALCIUM 8.9 8.7    Studies/Results: Ct Head Wo Contrast  04/06/2012  *RADIOLOGY REPORT*  Clinical Data: Recurrent subdural hematoma post craniotomy.  No acute changes.  CT HEAD WITHOUT CONTRAST  Technique:  Contiguous axial images were obtained from the base of the skull through the vertex without contrast.  Comparison: 04/03/2012  Findings: Interval postoperative changes with left posterior frontal craniotomy.  Bone flap is secured with plate and screws. Skin clips are present.  There is a subdural drainage catheter in the left frontal region.  There is improvement of the left sided subdural hematoma.  There is residual subdural fluid, with maximal depth along the convexity measuring up to about 1.9 cm.  There is gas in the subdural space likely due to postoperative change. Small right sided subdural fluid collection is stable.  Maximal depth is measured at about 7 mm.  Mild residual mass effect with left sided sulci effacement and left to right mediastinal shift of  about 3 mm.  This is improved since previous study.  Retention cyst in the left maxillary antrum.  IMPRESSION: Postoperative changes with the left posterior frontal craniotomy and left frontal subdural drainage catheter placed.  There is partial evacuation of the left subdural hematoma with improved mass effect since previous study.  Small right subdural hematoma is unchanged.   Original Report Authenticated By: Marlon Pel, M.D.     Assessment/Plan: Improved  LOS: 3 days  CT per radiologist: improved mass effect after partial Left SDH evacuation, small right without change. Dr. Venetia Maxon will review as well. Will d/w Dr. Venetia Maxon removal of drain.   [name] 04/06/2012, 7:16 AM

## 2012-04-06 NOTE — Progress Notes (Signed)
Patient doing well.  OK to D/C home.  Voiding trial Wednesday with Dr. Annabell Howells.  F/U with me next Wednesday for staple removal.

## 2012-04-06 NOTE — Telephone Encounter (Signed)
Spoke with pt wife, per dr Venetia Maxon he would like the pt to wait a couple weeks before having the echo and stress echo. The wife has talked to the testing area and they are checking to see if he can have the abdominal ultrasound tomorrow and schedule the stress test out further. She wanted to make sure okay to switch. Okay given.

## 2012-04-06 NOTE — Plan of Care (Signed)
Problem: Consults Goal: Diagnosis - Craniotomy Outcome: Completed/Met Date Met:  04/06/12 Subdural hematoma  Problem: Phase I Progression Outcomes Goal: Voiding-avoid urinary catheter unless indicated Outcome: Adequate for Discharge Followup appointment with urology to have leg bag removed

## 2012-04-06 NOTE — Discharge Summary (Signed)
Physician Discharge Summary  Patient ID: [name] MRN: [id number] DOB/AGE: 28-Apr-1941 71 y.o.  Admit date: 04/03/2012 Discharge date: 04/06/2012  Admission Diagnoses:Subdural hematoma  Discharge Diagnoses: Same with urinary retention Active Problems:  * No active hospital problems. *    Discharged Condition: good  Hospital Course: Craniotomy for subdural hematoma  Consults: None  Significant Diagnostic Studies: radiology: CT scan: Head  Treatments: surgery: Craniotomy for subdural hematoma  Discharge Exam: Blood pressure 134/54, pulse 84, temperature 98.5 F (36.9 C), temperature source Oral, resp. rate 17, height 6\' 2"  (1.88 m), weight 110.4 kg (243 lb 6.2 oz), SpO2 96.00%. Neurologic: Alert and oriented X 3, normal strength and tone. Normal symmetric reflexes. Normal coordination and gait Wound:CDI  Disposition: Home  Discharge Orders    Future Appointments: Provider: Department: Dept Phone: Center:   04/07/2012 2:00 PM [facility] Echo 3 [facility] Echo Lab [phone] None   04/07/2012 3:00 PM [facility] 2 [facility] Echo Lab [phone] None     Joint Appt [facility]Nm Nuclear 2 (Nuc Treadm) [facility] Nuclear Med [phone] None   04/13/2012 10:00 AM Billie Lade, MD [facility] [phone] None     Joint Appt [facility]Radonc Ct Sim 1 [facility] [phone] None   05/25/2012 9:00 AM [facility]Pv Pv 1 [facility] [phone] None   05/25/2012 10:15 AM [name], MD [facility] [phone] [id number]       Medication List     As of 04/06/2012  9:13 AM    TAKE these medications         allopurinol 300 MG tablet   Commonly known as: ZYLOPRIM   Take 300 mg by mouth daily.      amLODipine 5 MG tablet   Commonly known as: NORVASC   Take 1 tablet (5 mg total) by mouth daily.      aspirin 81 MG tablet   Take 81 mg by mouth daily.      omeprazole 20 MG capsule   Commonly known as: PRILOSEC   Take 20 mg by mouth daily.          Signed: Dorian Heckle, MD 04/06/2012, 9:13 AM

## 2012-04-06 NOTE — Progress Notes (Signed)
Patient ID: [name], male   DOB: Mar 12, 1941, 71 y.o.   MRN: [id number]  CT reviewed by Dr. Venetia Maxon. Order rec'd from Dr. Venetia Maxon to pull JP drain, mobilize, and transfer to 4N with possible d/c to home this evening or tomorrow. JP pulled. No bleeding from site. DSD to site. Pt tolerated all very well. Transfer order entered in Epic.  [name] RN, BSN

## 2012-04-06 NOTE — Telephone Encounter (Signed)
New problem:  Call need to discuss upcoming echo on 10/15. S/p brain surgery on 10/11.

## 2012-04-07 ENCOUNTER — Ambulatory Visit (HOSPITAL_COMMUNITY): Payer: Medicare Other | Attending: Cardiology

## 2012-04-07 ENCOUNTER — Other Ambulatory Visit (HOSPITAL_COMMUNITY): Payer: Medicare Other

## 2012-04-07 DIAGNOSIS — I369 Nonrheumatic tricuspid valve disorder, unspecified: Secondary | ICD-10-CM | POA: Insufficient documentation

## 2012-04-07 DIAGNOSIS — R0989 Other specified symptoms and signs involving the circulatory and respiratory systems: Secondary | ICD-10-CM | POA: Insufficient documentation

## 2012-04-07 DIAGNOSIS — I379 Nonrheumatic pulmonary valve disorder, unspecified: Secondary | ICD-10-CM | POA: Insufficient documentation

## 2012-04-07 DIAGNOSIS — I10 Essential (primary) hypertension: Secondary | ICD-10-CM | POA: Insufficient documentation

## 2012-04-07 DIAGNOSIS — R0609 Other forms of dyspnea: Secondary | ICD-10-CM | POA: Insufficient documentation

## 2012-04-07 DIAGNOSIS — I080 Rheumatic disorders of both mitral and aortic valves: Secondary | ICD-10-CM | POA: Insufficient documentation

## 2012-04-07 NOTE — Progress Notes (Signed)
Echocardiogram performed.  

## 2012-04-10 ENCOUNTER — Encounter (INDEPENDENT_AMBULATORY_CARE_PROVIDER_SITE_OTHER): Payer: Medicare Other

## 2012-04-10 DIAGNOSIS — I6529 Occlusion and stenosis of unspecified carotid artery: Secondary | ICD-10-CM

## 2012-04-10 DIAGNOSIS — R0989 Other specified symptoms and signs involving the circulatory and respiratory systems: Secondary | ICD-10-CM

## 2012-04-13 ENCOUNTER — Ambulatory Visit: Payer: Medicare Other | Admitting: Radiation Oncology

## 2012-04-15 ENCOUNTER — Encounter: Payer: Self-pay | Admitting: Cardiology

## 2012-05-05 ENCOUNTER — Encounter (HOSPITAL_COMMUNITY): Payer: Self-pay | Admitting: *Deleted

## 2012-05-05 ENCOUNTER — Ambulatory Visit (HOSPITAL_COMMUNITY): Payer: Medicare Other | Attending: Cardiology

## 2012-05-05 ENCOUNTER — Other Ambulatory Visit (HOSPITAL_COMMUNITY): Payer: Self-pay | Admitting: *Deleted

## 2012-05-05 DIAGNOSIS — R0602 Shortness of breath: Secondary | ICD-10-CM

## 2012-05-05 DIAGNOSIS — R0609 Other forms of dyspnea: Secondary | ICD-10-CM | POA: Insufficient documentation

## 2012-05-05 DIAGNOSIS — I4510 Unspecified right bundle-branch block: Secondary | ICD-10-CM | POA: Insufficient documentation

## 2012-05-05 DIAGNOSIS — R609 Edema, unspecified: Secondary | ICD-10-CM | POA: Insufficient documentation

## 2012-05-05 DIAGNOSIS — Z8679 Personal history of other diseases of the circulatory system: Secondary | ICD-10-CM | POA: Insufficient documentation

## 2012-05-05 DIAGNOSIS — Z87891 Personal history of nicotine dependence: Secondary | ICD-10-CM | POA: Insufficient documentation

## 2012-05-05 DIAGNOSIS — R0989 Other specified symptoms and signs involving the circulatory and respiratory systems: Secondary | ICD-10-CM | POA: Insufficient documentation

## 2012-05-05 DIAGNOSIS — I10 Essential (primary) hypertension: Secondary | ICD-10-CM | POA: Insufficient documentation

## 2012-05-05 MED ORDER — SODIUM CHLORIDE 0.9 % IV SOLN
40.0000 ug/kg | INTRAVENOUS | Status: DC
  Administered 2012-05-05: 40 ug/kg/min via INTRAVENOUS

## 2012-05-05 MED ORDER — ATROPINE SULFATE 1 MG/ML IJ SOLN
0.5000 mg | Freq: Once | INTRAMUSCULAR | Status: AC
  Administered 2012-05-05: 0.5 mg via INTRAVENOUS

## 2012-05-05 NOTE — Progress Notes (Signed)
Dobutamine stress Echocardiogram performed.  

## 2012-05-25 ENCOUNTER — Ambulatory Visit (INDEPENDENT_AMBULATORY_CARE_PROVIDER_SITE_OTHER): Payer: Medicare Other | Admitting: Cardiology

## 2012-05-25 ENCOUNTER — Other Ambulatory Visit: Payer: Self-pay | Admitting: Cardiology

## 2012-05-25 ENCOUNTER — Other Ambulatory Visit: Payer: Self-pay | Admitting: *Deleted

## 2012-05-25 ENCOUNTER — Encounter: Payer: Self-pay | Admitting: Cardiology

## 2012-05-25 VITALS — BP 166/76 | HR 90 | Ht 75.0 in | Wt 252.0 lb

## 2012-05-25 DIAGNOSIS — I714 Abdominal aortic aneurysm, without rupture: Secondary | ICD-10-CM | POA: Insufficient documentation

## 2012-05-25 DIAGNOSIS — I2510 Atherosclerotic heart disease of native coronary artery without angina pectoris: Secondary | ICD-10-CM

## 2012-05-25 MED ORDER — AMLODIPINE BESYLATE 10 MG PO TABS: 10.0000 mg | ORAL_TABLET | Freq: Every day | ORAL | Status: DC

## 2012-05-25 NOTE — Assessment & Plan Note (Signed)
Repeat abdominal ultrasound in October 2014.

## 2012-05-25 NOTE — Assessment & Plan Note (Signed)
Blood pressure is elevated. Increase amlodipine to 10 mg daily.continue to follow and adjust as needed.

## 2012-05-25 NOTE — Progress Notes (Signed)
HPI: Pleasant male I initially saw in Oct 2013 for evaluation of dyspnea. Note he also has a history of unexplained pulmonary embolus approximately 8 years ago treated with one year of Coumadin. PFTs in September 2013 showed mild obstructive disease. Echocardiogram in October of 2013 showed normal LV function, grade 1 diastolic dysfunction, moderate left ventricular hypertrophy, mild left atrial enlargement and trace aortic insufficiency. Dobutamine echocardiogram in November of 2013 was normal. Abdominal ultrasound in October of 2013 showed a 3.5 x 3.5 cm abdominal aortic aneurysm. Followup recommended in one year. D-dimer was elevated at 1.12. BNP 31. Chest CT showed no pulmonary embolus. There was coronary artery atherosclerosis and emphysema noted. Patient was admitted in October of 2013 and found to have a subdural hematoma is required evacuation. Since I last saw him, he denies dyspnea, chest pain, palpitations or syncope. He does have some fatigue with exertion.   Current Outpatient Prescriptions  Medication Sig Dispense Refill  . allopurinol (ZYLOPRIM) 300 MG tablet Take 300 mg by mouth daily.      Marland Kitchen amLODipine (NORVASC) 5 MG tablet Take 1 tablet (5 mg total) by mouth daily.  30 tablet  11  . omeprazole (PRILOSEC) 20 MG capsule Take 20 mg by mouth daily.      Marland Kitchen RAPAFLO 8 MG CAPS capsule Take 1 tablet by mouth Daily.      . sodium chloride 0.9 % SOLN with DOBUTamine 250 MG/20ML SOLN 1,000 mcg/mL Inject 40 mcg/kg into the vein continuous.       No current facility-administered medications for this visit.   Facility-Administered Medications Ordered in Other Visits  Medication Dose Route Frequency Provider Last Rate Last Dose  . DOBUTamine (DOBUTREX) 1,000 mcg/mL in sodium chloride 0.9 % 150 mL infusion  40 mcg/kg Intravenous Continuous [name], MD 265 mL/hr at 05/05/12 1600 40 mcg/kg/min at 05/05/12 1600     Past Medical History  Diagnosis Date  . Gout   . GERD  (gastroesophageal reflux disease)   . Barrett's esophagus 2009  . FH: colonic polyps   . Arthritis   . Hypertension   . Prostate cancer 12/2011    Found on routine screening PSA, planning for implants by Dr. Roselind Messier 10/16   . Pulmonary embolus 2005    Bilateral, coumadin x 1 years, inpatient for 5 days  . Subdural hematoma     Past Surgical History  Procedure Date  . Fracture left femur repair 09/2010    Went to [facility] x 3.5 weeks  . Tonsillectomy     as child.  . Craniotomy 04/03/2012    Procedure: CRANIOTOMY HEMATOMA EVACUATION SUBDURAL;  Surgeon: Maeola Harman, MD;  Location: [facility];  Service: Neurosurgery;  Laterality: Left;  Left Craniotomy for Evacuation of Subdural Hematoma    History   Social History  . Marital Status: Married    Spouse Name: N/A    Number of Children: 3  . Years of Education: N/A   Occupational History  .      Quarry manager   Social History Main Topics  . Smoking status: Former Smoker -- 1.0 packs/day for 20 years    Types: Cigarettes    Quit date: 12/23/1971  . Smokeless tobacco: Not on file  . Alcohol Use: 0.0 oz/week     Comment: 2 drinks per night, last drink was 03/29/2012  . Drug Use: No  . Sexually Active: Not on file   Other Topics Concern  . Not on file   Social History  Narrative  . No narrative on file    ROS: Fatigue but no fevers or chills, productive cough, hemoptysis, dysphasia, odynophagia, melena, hematochezia, dysuria, hematuria, rash, seizure activity, orthopnea, PND, pedal edema, claudication. Remaining systems are negative.  Physical Exam: Well-developed well-nourished in no acute distress.  Skin is warm and dry.  HEENT is normal.  Neck is supple.  Chest is clear to auscultation with normal expansion.  Cardiovascular exam is regular rate and rhythm.  Abdominal exam nontender or distended. No masses palpated. Extremities show trace edema. neuro grossly intact

## 2012-05-25 NOTE — Assessment & Plan Note (Signed)
Atherosclerosis noted on CT. Dobutamine echocardiogram shows no ischemia. Plan medical therapy. I will not add an aspirin at this point given recent subdural hematoma. I recommended a statin but he declined.

## 2012-05-25 NOTE — Patient Instructions (Addendum)
Your physician wants you to follow-up in: ONE YEAR WITH DR Shelda Pal will receive a reminder letter in the mail two months in advance. If you don't receive a letter, please call our office to schedule the follow-up appointment.   INCREASE AMLODIPINE TO 10 MG ONCE DAILY

## 2012-05-25 NOTE — Assessment & Plan Note (Signed)
Symptoms have improved.

## 2012-06-02 ENCOUNTER — Ambulatory Visit: Payer: Medicare Other

## 2012-06-09 ENCOUNTER — Ambulatory Visit
Admission: RE | Admit: 2012-06-09 | Discharge: 2012-06-09 | Disposition: A | Discharge status: Home / Self Care | Payer: Medicare Other | Source: Ambulatory Visit | Attending: Radiation Oncology | Admitting: Radiation Oncology

## 2012-06-09 DIAGNOSIS — Z79899 Other long term (current) drug therapy: Secondary | ICD-10-CM | POA: Insufficient documentation

## 2012-06-09 DIAGNOSIS — C61 Malignant neoplasm of prostate: Secondary | ICD-10-CM | POA: Insufficient documentation

## 2012-06-09 DIAGNOSIS — Z87891 Personal history of nicotine dependence: Secondary | ICD-10-CM | POA: Insufficient documentation

## 2012-06-09 DIAGNOSIS — K219 Gastro-esophageal reflux disease without esophagitis: Secondary | ICD-10-CM | POA: Insufficient documentation

## 2012-06-09 DIAGNOSIS — Z7982 Long term (current) use of aspirin: Secondary | ICD-10-CM | POA: Insufficient documentation

## 2012-06-09 DIAGNOSIS — M109 Gout, unspecified: Secondary | ICD-10-CM | POA: Insufficient documentation

## 2012-06-09 NOTE — Progress Notes (Signed)
  Radiation Oncology         [phone] ________________________________  Name: [name] MRN: [id number]  Date: 06/09/2012  DOB: 05/26/1941  SIMULATION AND TREATMENT PLANNING NOTE  DIAGNOSIS:  Prostate cancer  NARRATIVE:  The patient was brought to the CT Simulation planning suite.  Identity was confirmed.  All relevant records and images related to the planned course of therapy were reviewed.  The patient freely provided informed written consent to proceed with treatment after reviewing the details related to the planned course of therapy. The consent form was witnessed and verified by the simulation staff.  Then, the patient was set-up in a stable reproducible  supine position for radiation therapy.  CT images were obtained.  Surface markings were placed.  The CT images were loaded into the planning software.  Then the target and avoidance structures were contoured.  Treatment planning then occurred.  The radiation prescription was entered and confirmed.  A total of 1 complex treatment devices were fabricated. I have requested : Intensity Modulated Radiotherapy (IMRT) is medically necessary for this case for the following reason:  Rectal sparing..  I have ordered:dose calc. Rectal contrast was used for the procedure. Bladder catheterization was unsuccessful in light of a significantly narrowed penile urethra.  PLAN:  The patient will receive 78.0 Gy in 40 fractions.  ________________________________   Billie Lade, PhD, MD

## 2012-06-22 ENCOUNTER — Ambulatory Visit
Admission: RE | Admit: 2012-06-22 | Discharge: 2012-06-22 | Disposition: A | Discharge status: Home / Self Care | Payer: Medicare Other | Source: Ambulatory Visit | Attending: Radiation Oncology | Admitting: Radiation Oncology

## 2012-06-23 ENCOUNTER — Ambulatory Visit
Admission: RE | Admit: 2012-06-23 | Discharge: 2012-06-23 | Disposition: A | Discharge status: Home / Self Care | Payer: Medicare Other | Source: Ambulatory Visit | Attending: Radiation Oncology | Admitting: Radiation Oncology

## 2012-06-25 ENCOUNTER — Ambulatory Visit
Admission: RE | Admit: 2012-06-25 | Discharge: 2012-06-25 | Disposition: A | Discharge status: Home / Self Care | Payer: Medicare Other | Source: Ambulatory Visit | Attending: Radiation Oncology | Admitting: Radiation Oncology

## 2012-06-26 ENCOUNTER — Ambulatory Visit
Admission: RE | Admit: 2012-06-26 | Discharge: 2012-06-26 | Disposition: A | Discharge status: Home / Self Care | Payer: Medicare Other | Source: Ambulatory Visit | Attending: Radiation Oncology | Admitting: Radiation Oncology

## 2012-06-29 ENCOUNTER — Ambulatory Visit
Admission: RE | Admit: 2012-06-29 | Discharge: 2012-06-29 | Disposition: A | Discharge status: Home / Self Care | Payer: Medicare Other | Source: Ambulatory Visit | Attending: Radiation Oncology | Admitting: Radiation Oncology

## 2012-06-30 ENCOUNTER — Ambulatory Visit
Admission: RE | Admit: 2012-06-30 | Discharge: 2012-06-30 | Disposition: A | Discharge status: Home / Self Care | Payer: Medicare Other | Source: Ambulatory Visit | Attending: Radiation Oncology | Admitting: Radiation Oncology

## 2012-06-30 VITALS — BP 153/77 | HR 85 | Temp 98.3°F | Wt 252.0 lb

## 2012-06-30 DIAGNOSIS — C61 Malignant neoplasm of prostate: Secondary | ICD-10-CM

## 2012-06-30 NOTE — Progress Notes (Signed)
Patient here for routine weekly assessment of radiation to prostate.Has complete 6 of 40 treatments.No voiced concerns today.Routine of clinic reviewed with patient and side effects to include urinary and bowel changes, fatigue and skin changes and pain.Patient able to explain at least 2 of side effects of treatment.Has noticed slight increases in urinary patterns.

## 2012-06-30 NOTE — Progress Notes (Signed)
[facility]    Radiation Oncology [address] [place]     Maryln Gottron, M.D. [place], [place] [address]               Billie Lade, M.D., Ph.D. Phone: [phone]      Molli Hazard A. Kathrynn Running, M.D. Fax: [phone]      Radene Gunning, M.D., Ph.D.         Lurline Hare, M.D.         Grayland Jack, M.D Weekly Treatment Management Note  Name: [name]     MRN: [id number]        CSN: [id number] Date: 06/30/2012      DOB: 05/18/1941  CC: Jeffery Moh, MD         [place]    Status: Outpatient  Diagnosis: Prostate cancer  Current Dose: 11.7 Gy  Current Fraction: 6  Planned Dose: 78.0 Gy  Narrative: [name] was seen today for weekly treatment management. The chart was checked and CBCT  were reviewed. He is tolerating his radiation therapy well without any side effects at this time.  Review of patient's allergies indicates no known allergies.  Current Outpatient Prescriptions  Medication Sig Dispense Refill  . allopurinol (ZYLOPRIM) 300 MG tablet Take 300 mg by mouth daily.      Marland Kitchen amLODipine (NORVASC) 10 MG tablet Take 1 tablet (10 mg total) by mouth daily.  90 tablet  4  . omeprazole (PRILOSEC) 20 MG capsule Take 20 mg by mouth daily.       No current facility-administered medications for this encounter.   Facility-Administered Medications Ordered in Other Encounters  Medication Dose Route Frequency Provider Last Rate Last Dose  . DOBUTamine (DOBUTREX) 1,000 mcg/mL in sodium chloride 0.9 % 150 mL infusion  40 mcg/kg Intravenous Continuous [name], MD 265 mL/hr at 05/05/12 1600 40 mcg/kg/min at 05/05/12 1600   Labs:  Lab Results  Component Value Date   WBC 7.3 04/05/2012   HGB 13.6 04/05/2012   HCT 39.6 04/05/2012   MCV 94.7 04/05/2012   PLT 177 04/05/2012   Lab Results  Component Value Date   CREATININE 0.80 04/05/2012   BUN 19 04/05/2012   NA 136 04/05/2012   K 4.0 04/05/2012   CL 102 04/05/2012   CO2 25 04/05/2012    Lab Results  Component Value Date   ALT 12 04/04/2012   AST 15 04/04/2012   BILITOT 0.5 04/04/2012    Physical Examination:  weight is 252 lb (114.306 kg). His temperature is 98.3 F (36.8 C). His blood pressure is 153/77 and his pulse is 85.    Wt Readings from Last 3 Encounters:  06/30/12 252 lb (114.306 kg)  05/25/12 252 lb (114.306 kg)  04/06/12 243 lb 6.2 oz (110.4 kg)     Lungs - Normal respiratory effort, chest expands symmetrically. Lungs are clear to auscultation, no crackles or wheezes.  Heart has regular rhythm and rate  Abdomen is soft and non tender with normal bowel sounds  Assessment:  Patient tolerating treatments well  Plan: Continue treatment per original radiation prescription

## 2012-07-01 ENCOUNTER — Ambulatory Visit
Admission: RE | Admit: 2012-07-01 | Discharge: 2012-07-01 | Disposition: A | Discharge status: Home / Self Care | Payer: Medicare Other | Source: Ambulatory Visit | Attending: Radiation Oncology | Admitting: Radiation Oncology

## 2012-07-02 ENCOUNTER — Ambulatory Visit
Admission: RE | Admit: 2012-07-02 | Discharge: 2012-07-02 | Disposition: A | Discharge status: Home / Self Care | Payer: Medicare Other | Source: Ambulatory Visit | Attending: Radiation Oncology | Admitting: Radiation Oncology

## 2012-07-03 ENCOUNTER — Ambulatory Visit
Admission: RE | Admit: 2012-07-03 | Discharge: 2012-07-03 | Disposition: A | Discharge status: Home / Self Care | Payer: Medicare Other | Source: Ambulatory Visit | Attending: Radiation Oncology | Admitting: Radiation Oncology

## 2012-07-06 ENCOUNTER — Ambulatory Visit
Admission: RE | Admit: 2012-07-06 | Discharge: 2012-07-06 | Disposition: A | Discharge status: Home / Self Care | Payer: Medicare Other | Source: Ambulatory Visit | Attending: Radiation Oncology | Admitting: Radiation Oncology

## 2012-07-07 ENCOUNTER — Ambulatory Visit
Admission: RE | Admit: 2012-07-07 | Discharge: 2012-07-07 | Disposition: A | Discharge status: Home / Self Care | Payer: Medicare Other | Source: Ambulatory Visit | Attending: Radiation Oncology | Admitting: Radiation Oncology

## 2012-07-07 ENCOUNTER — Encounter: Payer: Self-pay | Admitting: Radiation Oncology

## 2012-07-07 VITALS — Resp 16 | Wt 257.0 lb

## 2012-07-07 DIAGNOSIS — C61 Malignant neoplasm of prostate: Secondary | ICD-10-CM

## 2012-07-07 NOTE — Progress Notes (Signed)
Patient presents to the clinic today accompanied by his wife for PUT with Dr. Roselind Messier. Patient alert and oriented to person, place, and time. No distress noted. Steady gait noted. Pleasant affect noted. Patient reports pain in his back related to arthritis. Patient denies taking any pain medication to relieve this. Patient reports that his urine stream is weak. Patient denies urinary urgency. Patient reports frequency. Patient reports that he voids during the day every hour. Patient reports that he only gets up once during the night to void. Patient reports sleeping without difficulty. Patient denies hematuria or burning with urination. Patient reports soft stool but, denies diarrhea. Patient reports a good appetite. Weight stable. Patient reports decreased energy level times one month. Reported all findings to Dr. Roselind Messier.

## 2012-07-07 NOTE — Progress Notes (Signed)
[facility]    Radiation Oncology [address] [place]     Maryln Gottron, M.D. [place], [place] [address]               Billie Lade, M.D., Ph.D. Phone: [phone]      Molli Hazard A. Kathrynn Running, M.D. Fax: [phone]      Radene Gunning, M.D., Ph.D.         Lurline Hare, M.D.         Grayland Jack, M.D Weekly Treatment Management Note  Name: [name]     MRN: [id number]        CSN: [id number] Date: 07/07/2012      DOB: Jun 12, 1941  CC: Jeffery Moh, MD         [place]    Status: Outpatient  Diagnosis: The encounter diagnosis was Prostate cancer.  Current Dose: 21.45 Gy  Current Fraction: 11  Planned Dose: 78 Gy  Narrative: [name] was seen today for weekly treatment management. The chart was checked and CBCT  were reviewed. He is tolerating his treatments well at this time. His only change thus far is loose bowels  Review of patient's allergies indicates no known allergies.    Physical Examination:  weight is 257 lb (116.574 kg). His respiration is 16.    Wt Readings from Last 3 Encounters:  07/07/12 257 lb (116.574 kg)  06/30/12 252 lb (114.306 kg)  05/25/12 252 lb (114.306 kg)     Lungs - Normal respiratory effort, chest expands symmetrically. Lungs are clear to auscultation, no crackles or wheezes.  Heart has regular rhythm and rate  Abdomen is soft and non tender with normal bowel sounds  Assessment:  Patient tolerating treatments well  Plan: Continue treatment per original radiation prescription

## 2012-07-08 ENCOUNTER — Ambulatory Visit
Admission: RE | Admit: 2012-07-08 | Discharge: 2012-07-08 | Disposition: A | Discharge status: Home / Self Care | Payer: Medicare Other | Source: Ambulatory Visit | Attending: Radiation Oncology | Admitting: Radiation Oncology

## 2012-07-09 ENCOUNTER — Ambulatory Visit
Admission: RE | Admit: 2012-07-09 | Discharge: 2012-07-09 | Disposition: A | Discharge status: Home / Self Care | Payer: Medicare Other | Source: Ambulatory Visit | Attending: Radiation Oncology | Admitting: Radiation Oncology

## 2012-07-10 ENCOUNTER — Ambulatory Visit
Admission: RE | Admit: 2012-07-10 | Discharge: 2012-07-10 | Disposition: A | Discharge status: Home / Self Care | Payer: Medicare Other | Source: Ambulatory Visit | Attending: Radiation Oncology | Admitting: Radiation Oncology

## 2012-07-13 ENCOUNTER — Ambulatory Visit
Admission: RE | Admit: 2012-07-13 | Discharge: 2012-07-13 | Disposition: A | Discharge status: Home / Self Care | Payer: Medicare Other | Source: Ambulatory Visit | Attending: Radiation Oncology | Admitting: Radiation Oncology

## 2012-07-14 ENCOUNTER — Ambulatory Visit
Admission: RE | Admit: 2012-07-14 | Discharge: 2012-07-14 | Disposition: A | Discharge status: Home / Self Care | Payer: Medicare Other | Source: Ambulatory Visit | Attending: Radiation Oncology | Admitting: Radiation Oncology

## 2012-07-14 ENCOUNTER — Encounter: Payer: Self-pay | Admitting: Radiation Oncology

## 2012-07-14 VITALS — BP 152/88 | HR 92 | Resp 16 | Wt 256.8 lb

## 2012-07-14 DIAGNOSIS — C61 Malignant neoplasm of prostate: Secondary | ICD-10-CM

## 2012-07-14 NOTE — Progress Notes (Signed)
[facility]    Radiation Oncology [address] [place]     Maryln Gottron, M.D. [place], [place] [address]               Billie Lade, M.D., Ph.D. Phone: [phone]      Molli Hazard A. Kathrynn Running, M.D. Fax: [phone]      Radene Gunning, M.D., Ph.D.         Lurline Hare, M.D.         Grayland Jack, M.D Weekly Treatment Management Note  Name: [name]     MRN: [id number]        CSN: [id number] Date: 07/14/2012      DOB: 01-22-1941  CC: [name], MD         [place]    Status: Outpatient  Diagnosis: The encounter diagnosis was Prostate cancer.  Current Dose: 31.2 Gy  Current Fraction: 16  Planned Dose: 78 Gy  Narrative: [name] was seen today for weekly treatment management. The chart was checked and CBCT  were reviewed. He is tolerating his radiation treatment well at this time without any significant urinary symptoms or bowel complaints. He has some mild fatigue.  Review of patient's allergies indicates no known allergies.    Physical Examination:  weight is 256 lb 12.8 oz (116.484 kg). His blood pressure is 152/88 and his pulse is 92. His respiration is 16.    Wt Readings from Last 3 Encounters:  07/14/12 256 lb 12.8 oz (116.484 kg)  07/07/12 257 lb (116.574 kg)  06/30/12 252 lb (114.306 kg)     Lungs - Normal respiratory effort, chest expands symmetrically. Lungs are clear to auscultation, no crackles or wheezes.  Heart has regular rhythm and rate  Abdomen is soft and non tender with normal bowel sounds  Assessment:  Patient tolerating treatments well  Plan: Continue treatment per original radiation prescription

## 2012-07-14 NOTE — Progress Notes (Signed)
Patient presents to the clinic today accompanied by his wife for a PUT with Dr. Roselind Messier. Patient alert and oriented to person, place, and time.  No distress noted. Steady gait noted. Pleasant affect noted. Patient denies pain at this time. Patient not taking rapaflo. Patient reports a weak urine stream. Patient denies incontinence. Patient reports that during the day he urinates every hour but, only once on average during the night. Patient denies burning with urination. Patient denies diarrhea or constipation. Patient reports chronic low back pain related to effects of arthritis. Patient denies hematuria. Reported all findings to Dr. Roselind Messier.   Wife has questions about appointment with Dr. Gerrit Friends to address subdural hematoma.

## 2012-07-15 ENCOUNTER — Telehealth: Payer: Self-pay | Admitting: Radiation Oncology

## 2012-07-15 ENCOUNTER — Ambulatory Visit
Admission: RE | Admit: 2012-07-15 | Discharge: 2012-07-15 | Disposition: A | Discharge status: Home / Self Care | Payer: Medicare Other | Source: Ambulatory Visit | Attending: Radiation Oncology | Admitting: Radiation Oncology

## 2012-07-15 NOTE — Telephone Encounter (Signed)
Per Dr. Trina Ao order phoned Dr. Belva Crome office and spoke with Elnita Maxwell. Requested that [facility] cancel appointment for patient tomorrow with Dr. Annabell Howells. Requested she reschedule the patient for the end of April since radiation is not complete yet and a PSA draw is too soon. Elnita Maxwell verbalized understanding and expressed that she would be glad to re schedule accordingly.

## 2012-07-16 ENCOUNTER — Ambulatory Visit
Admission: RE | Admit: 2012-07-16 | Discharge: 2012-07-16 | Disposition: A | Discharge status: Home / Self Care | Payer: Medicare Other | Source: Ambulatory Visit | Attending: Radiation Oncology | Admitting: Radiation Oncology

## 2012-07-17 ENCOUNTER — Ambulatory Visit
Admission: RE | Admit: 2012-07-17 | Discharge: 2012-07-17 | Disposition: A | Discharge status: Home / Self Care | Payer: Medicare Other | Source: Ambulatory Visit | Attending: Radiation Oncology | Admitting: Radiation Oncology

## 2012-07-20 ENCOUNTER — Ambulatory Visit
Admission: RE | Admit: 2012-07-20 | Discharge: 2012-07-20 | Disposition: A | Discharge status: Home / Self Care | Payer: Medicare Other | Source: Ambulatory Visit | Attending: Radiation Oncology | Admitting: Radiation Oncology

## 2012-07-21 ENCOUNTER — Encounter: Payer: Self-pay | Admitting: Radiation Oncology

## 2012-07-21 ENCOUNTER — Ambulatory Visit
Admission: RE | Admit: 2012-07-21 | Discharge: 2012-07-21 | Disposition: A | Discharge status: Home / Self Care | Payer: Medicare Other | Source: Ambulatory Visit | Attending: Radiation Oncology | Admitting: Radiation Oncology

## 2012-07-21 VITALS — BP 151/85 | HR 87 | Temp 98.3°F | Resp 20 | Wt 258.9 lb

## 2012-07-21 DIAGNOSIS — C61 Malignant neoplasm of prostate: Secondary | ICD-10-CM

## 2012-07-21 NOTE — Progress Notes (Signed)
Pt denies pain, urinary/bowel issues, loss of appetite. He reports ongoing fatigue, nocturia x 1.

## 2012-07-21 NOTE — Progress Notes (Signed)
[facility]    Radiation Oncology [address] [place]     Maryln Gottron, M.D. [place], [place] [address]               Billie Lade, M.D., Ph.D. Phone: [phone]      Molli Hazard A. Kathrynn Running, M.D. Fax: [phone]      Radene Gunning, M.D., Ph.D.         Lurline Hare, M.D.         Grayland Jack, M.D Weekly Treatment Management Note  Name: [name]     MRN: [id number]        CSN: [id number] Date: 07/21/2012      DOB: 1940-11-21  CC: Jeffery Moh, MD         [place]    Status: Outpatient  Diagnosis: The encounter diagnosis was Prostate cancer.  Current Dose: 40.95 Gy  Current Fraction: 21  Planned Dose: 78 Gy  Narrative: [name] was seen today for weekly treatment management. The chart was checked and CBCT  were reviewed. He is having some fatigue. He is also noticed some frequency during the day but no dysuria. he denies any bowel problems.  Review of patient's allergies indicates no known allergies.  Current Outpatient Prescriptions  Medication Sig Dispense Refill  . allopurinol (ZYLOPRIM) 300 MG tablet Take 300 mg by mouth daily.      Marland Kitchen amLODipine (NORVASC) 10 MG tablet Take 1 tablet (10 mg total) by mouth daily.  90 tablet  4  . omeprazole (PRILOSEC) 20 MG capsule Take 20 mg by mouth daily.      Marland Kitchen RAPAFLO 8 MG CAPS capsule        No current facility-administered medications for this encounter.   Facility-Administered Medications Ordered in Other Encounters  Medication Dose Route Frequency Provider Last Rate Last Dose  . DOBUTamine (DOBUTREX) 1,000 mcg/mL in sodium chloride 0.9 % 150 mL infusion  40 mcg/kg Intravenous Continuous [name], MD 265 mL/hr at 05/05/12 1600 40 mcg/kg/min at 05/05/12 1600    Physical Examination:  weight is 258 lb 14.4 oz (117.436 kg). His oral temperature is 98.3 F (36.8 C). His blood pressure is 151/85 and his pulse is 87. His respiration is 20.    Wt Readings from Last 3 Encounters:    07/21/12 258 lb 14.4 oz (117.436 kg)  07/14/12 256 lb 12.8 oz (116.484 kg)  07/07/12 257 lb (116.574 kg)     Lungs - Normal respiratory effort, chest expands symmetrically. Lungs are clear to auscultation, no crackles or wheezes.  Heart has regular rhythm and rate  Abdomen is soft and non tender with normal bowel sounds  Assessment:  Patient tolerating treatments well  Plan: Continue treatment per original radiation prescription

## 2012-07-22 ENCOUNTER — Ambulatory Visit
Admission: RE | Admit: 2012-07-22 | Discharge: 2012-07-22 | Disposition: A | Discharge status: Home / Self Care | Payer: Medicare Other | Source: Ambulatory Visit | Attending: Radiation Oncology | Admitting: Radiation Oncology

## 2012-07-23 ENCOUNTER — Ambulatory Visit
Admission: RE | Admit: 2012-07-23 | Discharge: 2012-07-23 | Disposition: A | Discharge status: Home / Self Care | Payer: Medicare Other | Source: Ambulatory Visit | Attending: Radiation Oncology | Admitting: Radiation Oncology

## 2012-07-24 ENCOUNTER — Ambulatory Visit
Admission: RE | Admit: 2012-07-24 | Discharge: 2012-07-24 | Disposition: A | Discharge status: Home / Self Care | Payer: Medicare Other | Source: Ambulatory Visit | Attending: Radiation Oncology | Admitting: Radiation Oncology

## 2012-07-27 ENCOUNTER — Ambulatory Visit
Admission: RE | Admit: 2012-07-27 | Discharge: 2012-07-27 | Disposition: A | Discharge status: Home / Self Care | Payer: Medicare Other | Source: Ambulatory Visit | Attending: Radiation Oncology | Admitting: Radiation Oncology

## 2012-07-28 ENCOUNTER — Ambulatory Visit
Admission: RE | Admit: 2012-07-28 | Discharge: 2012-07-28 | Disposition: A | Discharge status: Home / Self Care | Payer: Medicare Other | Source: Ambulatory Visit | Attending: Radiation Oncology | Admitting: Radiation Oncology

## 2012-07-28 ENCOUNTER — Encounter: Payer: Self-pay | Admitting: Radiation Oncology

## 2012-07-28 VITALS — BP 133/84 | HR 87 | Resp 16 | Wt 256.3 lb

## 2012-07-28 DIAGNOSIS — C61 Malignant neoplasm of prostate: Secondary | ICD-10-CM

## 2012-07-28 NOTE — Progress Notes (Signed)
[facility]    Radiation Oncology [address] [place]     Maryln Gottron, M.D. [place], [place] [address]               Billie Lade, M.D., Ph.D. Phone: [phone]      Molli Hazard A. Kathrynn Running, M.D. Fax: [phone]      Radene Gunning, M.D., Ph.D.         Lurline Hare, M.D.         Grayland Jack, M.D Weekly Treatment Management Note  Name: [name]     MRN: [id number]        CSN: [id number] Date: 07/28/2012      DOB: [birth year]  CC: Jeffery Moh, MD         [place]    Status: Outpatient  Diagnosis: The encounter diagnosis was Prostate cancer.  Current Dose: 50.7 Gy  Current Fraction: 26  Planned Dose: 78 Gy  Narrative: [name] was seen today for weekly treatment management. The chart was checked and CBCT  were reviewed. He has noticed  More urinary frequency during the day. He does feel he is emptying bladder.  He has nocturia one to 2 times which is his baseline. Recommended he consider some medication if this gets to be more of a problem for him.  He denies any bowel problems.  Review of patient's allergies indicates no known allergies.  Current Outpatient Prescriptions  Medication Sig Dispense Refill  . allopurinol (ZYLOPRIM) 300 MG tablet Take 300 mg by mouth daily.      Marland Kitchen amLODipine (NORVASC) 10 MG tablet Take 1 tablet (10 mg total) by mouth daily.  90 tablet  4  . omeprazole (PRILOSEC) 20 MG capsule Take 20 mg by mouth daily.      Marland Kitchen RAPAFLO 8 MG CAPS capsule        No current facility-administered medications for this encounter.   Facility-Administered Medications Ordered in Other Encounters  Medication Dose Route Frequency Provider Last Rate Last Dose  . DOBUTamine (DOBUTREX) 1,000 mcg/mL in sodium chloride 0.9 % 150 mL infusion  40 mcg/kg Intravenous Continuous [name], MD 265 mL/hr at 05/05/12 1600 40 mcg/kg/min at 05/05/12 1600     Physical Examination:  weight is 256 lb 4.8 oz (116.257 kg). His blood pressure is  133/84 and his pulse is 87. His respiration is 16.    Wt Readings from Last 3 Encounters:  07/28/12 256 lb 4.8 oz (116.257 kg)  07/21/12 258 lb 14.4 oz (117.436 kg)  07/14/12 256 lb 12.8 oz (116.484 kg)     Lungs - Normal respiratory effort, chest expands symmetrically. Lungs are clear to auscultation, no crackles or wheezes.  Heart has regular rhythm and rate  Abdomen is soft and non tender with normal bowel sounds  Assessment:  Patient tolerating treatments well except for issues as above  Plan: Continue treatment per original radiation prescription

## 2012-07-28 NOTE — Progress Notes (Signed)
Patient presents to the clinic today accompanied for PUT with Dr. Roselind Messier. Patient is alert and oriented to person, place, and time. No distress noted. Steady gait noted. Pleasant affect noted. Patient denies pain at this time. Patient reports fatigue despite sleeping well at night. Patient reports getting up during the night on average twice which is an increase of frequency then, every two hours during the day. However, patient reports voiding every thirty minutes yesterday. Patient reports a weak urine stream. Patient denies burning with urination or difficulty emptying his bladder. Patient denies taking rapaflo because "it doesn't help." Reported all findings to Dr. Roselind Messier.

## 2012-07-29 ENCOUNTER — Ambulatory Visit
Admission: RE | Admit: 2012-07-29 | Discharge: 2012-07-29 | Disposition: A | Discharge status: Home / Self Care | Payer: Medicare Other | Source: Ambulatory Visit | Attending: Radiation Oncology | Admitting: Radiation Oncology

## 2012-07-30 ENCOUNTER — Ambulatory Visit
Admission: RE | Admit: 2012-07-30 | Discharge: 2012-07-30 | Disposition: A | Discharge status: Home / Self Care | Payer: Medicare Other | Source: Ambulatory Visit | Attending: Radiation Oncology | Admitting: Radiation Oncology

## 2012-07-31 ENCOUNTER — Ambulatory Visit
Admission: RE | Admit: 2012-07-31 | Discharge: 2012-07-31 | Disposition: A | Discharge status: Home / Self Care | Payer: Medicare Other | Source: Ambulatory Visit | Attending: Radiation Oncology | Admitting: Radiation Oncology

## 2012-08-03 ENCOUNTER — Ambulatory Visit
Admission: RE | Admit: 2012-08-03 | Discharge: 2012-08-03 | Disposition: A | Discharge status: Home / Self Care | Payer: Medicare Other | Source: Ambulatory Visit | Attending: Radiation Oncology | Admitting: Radiation Oncology

## 2012-08-03 DIAGNOSIS — Z510 Encounter for antineoplastic radiation therapy: Secondary | ICD-10-CM | POA: Insufficient documentation

## 2012-08-03 DIAGNOSIS — C61 Malignant neoplasm of prostate: Secondary | ICD-10-CM | POA: Insufficient documentation

## 2012-08-03 DIAGNOSIS — Z79899 Other long term (current) drug therapy: Secondary | ICD-10-CM | POA: Insufficient documentation

## 2012-08-04 ENCOUNTER — Ambulatory Visit
Admission: RE | Admit: 2012-08-04 | Discharge: 2012-08-04 | Disposition: A | Discharge status: Home / Self Care | Payer: Medicare Other | Source: Ambulatory Visit | Attending: Radiation Oncology | Admitting: Radiation Oncology

## 2012-08-04 ENCOUNTER — Encounter: Payer: Self-pay | Admitting: Radiation Oncology

## 2012-08-04 VITALS — BP 128/76 | HR 93 | Resp 18 | Wt 257.1 lb

## 2012-08-04 DIAGNOSIS — C61 Malignant neoplasm of prostate: Secondary | ICD-10-CM

## 2012-08-04 DIAGNOSIS — Z87891 Personal history of nicotine dependence: Secondary | ICD-10-CM | POA: Insufficient documentation

## 2012-08-04 DIAGNOSIS — K219 Gastro-esophageal reflux disease without esophagitis: Secondary | ICD-10-CM | POA: Insufficient documentation

## 2012-08-04 DIAGNOSIS — M109 Gout, unspecified: Secondary | ICD-10-CM | POA: Insufficient documentation

## 2012-08-04 DIAGNOSIS — Z79899 Other long term (current) drug therapy: Secondary | ICD-10-CM | POA: Insufficient documentation

## 2012-08-04 DIAGNOSIS — Z7982 Long term (current) use of aspirin: Secondary | ICD-10-CM | POA: Insufficient documentation

## 2012-08-04 NOTE — Progress Notes (Signed)
Patient presents to the clinic today accompanied by his wife for PUT with Dr. Roselind Messier. Patient alert and oriented to person, place, and time. No distress noted. Steady gait noted. Flat affect noted. Patient denies pain at this time. Patient denies burning with urination. Patient denies diarrhea. Patient denies hematuria. Patient reports on average he gets up only once during the night to void. Patient reports a weak urine stream. Patient reports that during the day sometimes he goes as many times as every thirty minutes then, other days every 2-3 hours. Reported all findings to Dr. Roselind Messier.

## 2012-08-04 NOTE — Progress Notes (Signed)
[facility]    Radiation Oncology [address] [place]     Maryln Gottron, M.D. [place], [place] [address]               Billie Lade, M.D., Ph.D. Phone: [phone]      Molli Hazard A. Kathrynn Running, M.D. Fax: [phone]      Radene Gunning, M.D., Ph.D.         Lurline Hare, M.D.         Grayland Jack, M.D Weekly Treatment Management Note  Name: [name]     MRN: [id number]        CSN: [id number] Date: 08/04/2012      DOB: 03-Apr-1941  CC: Jeffery Moh, MD         [place]    Status: Outpatient  Diagnosis: The encounter diagnosis was Prostate cancer.  Current Dose: 60.45 Gy  Current Fraction: 31  Planned Dose: 78 Gy  Narrative: [name] was seen today for weekly treatment management. The chart was checked and CBCT  were reviewed. He is tolerating the treatments well except for some fatigue and urinary frequency. He denies any hematuria or dysuria or bowel complaints.  Review of patient's allergies indicates no known allergies.  Current Outpatient Prescriptions  Medication Sig Dispense Refill  . allopurinol (ZYLOPRIM) 300 MG tablet Take 300 mg by mouth daily.      Marland Kitchen amLODipine (NORVASC) 10 MG tablet Take 1 tablet (10 mg total) by mouth daily.  90 tablet  4  . omeprazole (PRILOSEC) 20 MG capsule Take 20 mg by mouth daily.      Marland Kitchen RAPAFLO 8 MG CAPS capsule        No current facility-administered medications for this encounter.   Facility-Administered Medications Ordered in Other Encounters  Medication Dose Route Frequency Provider Last Rate Last Dose  . DOBUTamine (DOBUTREX) 1,000 mcg/mL in sodium chloride 0.9 % 150 mL infusion  40 mcg/kg Intravenous Continuous [name], MD 265 mL/hr at 05/05/12 1600 40 mcg/kg/min at 05/05/12 1600     Physical Examination:  vitals were not taken for this visit.   Wt Readings from Last 3 Encounters:  08/04/12 257 lb 1.6 oz (116.62 kg)  07/28/12 256 lb 4.8 oz (116.257 kg)  07/21/12 258 lb 14.4 oz  (117.436 kg)     Lungs - Normal respiratory effort, chest expands symmetrically. Lungs are clear to auscultation, no crackles or wheezes.  Heart has regular rhythm and rate  Abdomen is soft and non tender with normal bowel sounds  Assessment:  Patient tolerating treatments well  Plan: Continue treatment per original radiation prescription

## 2012-08-05 ENCOUNTER — Ambulatory Visit
Admission: RE | Admit: 2012-08-05 | Discharge: 2012-08-05 | Disposition: A | Discharge status: Home / Self Care | Payer: Medicare Other | Source: Ambulatory Visit | Attending: Radiation Oncology | Admitting: Radiation Oncology

## 2012-08-06 ENCOUNTER — Ambulatory Visit: Payer: Medicare Other

## 2012-08-07 ENCOUNTER — Ambulatory Visit
Admission: RE | Admit: 2012-08-07 | Discharge: 2012-08-07 | Disposition: A | Discharge status: Home / Self Care | Payer: Medicare Other | Source: Ambulatory Visit | Attending: Radiation Oncology | Admitting: Radiation Oncology

## 2012-08-10 ENCOUNTER — Ambulatory Visit
Admission: RE | Admit: 2012-08-10 | Discharge: 2012-08-10 | Disposition: A | Discharge status: Home / Self Care | Payer: Medicare Other | Source: Ambulatory Visit | Attending: Radiation Oncology | Admitting: Radiation Oncology

## 2012-08-11 ENCOUNTER — Ambulatory Visit
Admission: RE | Admit: 2012-08-11 | Discharge: 2012-08-11 | Disposition: A | Discharge status: Home / Self Care | Payer: Medicare Other | Source: Ambulatory Visit | Attending: Radiation Oncology | Admitting: Radiation Oncology

## 2012-08-11 ENCOUNTER — Encounter: Payer: Self-pay | Admitting: Radiation Oncology

## 2012-08-11 VITALS — BP 143/85 | HR 83 | Temp 98.2°F | Resp 20 | Wt 256.4 lb

## 2012-08-11 DIAGNOSIS — C61 Malignant neoplasm of prostate: Secondary | ICD-10-CM

## 2012-08-11 NOTE — Progress Notes (Signed)
Rad txs prostate 35/40 completed, alert,oriented x3, frequency still when voiding, up 1-2x night ,regular bowels, eating and drinking okay No pain 2:28 PM

## 2012-08-11 NOTE — Progress Notes (Signed)
[facility]    Radiation Oncology [address] [employer]     [name], M.D. [place], [place] [address]               [name], M.D., Ph.D. Phone: [phone]      [name], M.D. Fax: [phone]      [name], M.D., Ph.D.         [name], M.D.         [name], M.D Weekly Treatment Management Note  Name: [name]     MRN: [id number]        CSN: [id number] Date: 08/11/2012      DOB: 1941/04/27  CC: Jeffery Moh, MD         [place]   Status: Outpatient  Diagnosis: The encounter diagnosis was Prostate cancer.  Current Dose: 68.25 Gy  Current Fraction: 35  Planned Dose: 78 Gy  Narrative: [name] was seen today for weekly treatment management. The chart was checked and CBCT  were reviewed. He is to tolerate his radiation therapy well at this time with minimal urinary and bowel complaints. He did have one episode of diarrhea over the past week but no rectal bleeding or proctitis symptoms  Review of patient's allergies indicates no known allergies.  Current Outpatient Prescriptions  Medication Sig Dispense Refill  . allopurinol (ZYLOPRIM) 300 MG tablet Take 300 mg by mouth daily.      Marland Kitchen amLODipine (NORVASC) 10 MG tablet Take 1 tablet (10 mg total) by mouth daily.  90 tablet  4  . omeprazole (PRILOSEC) 20 MG capsule Take 20 mg by mouth daily.       No current facility-administered medications for this encounter.   Facility-Administered Medications Ordered in Other Encounters  Medication Dose Route Frequency Provider Last Rate Last Dose  . DOBUTamine (DOBUTREX) 1,000 mcg/mL in sodium chloride 0.9 % 150 mL infusion  40 mcg/kg Intravenous Continuous [name], MD 265 mL/hr at 05/05/12 1600 40 mcg/kg/min at 05/05/12 1600    Physical Examination:  weight is 256 lb 6.4 oz (116.302 kg). His oral temperature is 98.2 F (36.8 C). His blood pressure is 143/85 and his pulse is 83. His respiration is 20.    Wt  Readings from Last 3 Encounters:  08/11/12 256 lb 6.4 oz (116.302 kg)  08/04/12 257 lb 1.6 oz (116.62 kg)  07/28/12 256 lb 4.8 oz (116.257 kg)     Lungs - Normal respiratory effort, chest expands symmetrically. Lungs are clear to auscultation, no crackles or wheezes.  Heart has regular rhythm and rate  Abdomen is soft and non tender with normal bowel sounds  Assessment:  Patient tolerating treatments well  Plan: Continue treatment per original radiation prescription

## 2012-08-12 ENCOUNTER — Ambulatory Visit
Admission: RE | Admit: 2012-08-12 | Discharge: 2012-08-12 | Disposition: A | Discharge status: Home / Self Care | Payer: Medicare Other | Source: Ambulatory Visit | Attending: Radiation Oncology | Admitting: Radiation Oncology

## 2012-08-13 ENCOUNTER — Ambulatory Visit
Admission: RE | Admit: 2012-08-13 | Discharge: 2012-08-13 | Disposition: A | Discharge status: Home / Self Care | Payer: Medicare Other | Source: Ambulatory Visit | Attending: Radiation Oncology | Admitting: Radiation Oncology

## 2012-08-14 ENCOUNTER — Ambulatory Visit
Admission: RE | Admit: 2012-08-14 | Discharge: 2012-08-14 | Disposition: A | Discharge status: Home / Self Care | Payer: Medicare Other | Source: Ambulatory Visit | Attending: Radiation Oncology | Admitting: Radiation Oncology

## 2012-08-17 ENCOUNTER — Ambulatory Visit
Admission: RE | Admit: 2012-08-17 | Discharge: 2012-08-17 | Disposition: A | Discharge status: Home / Self Care | Payer: Medicare Other | Source: Ambulatory Visit | Attending: Radiation Oncology | Admitting: Radiation Oncology

## 2012-08-18 ENCOUNTER — Ambulatory Visit
Admission: RE | Admit: 2012-08-18 | Discharge: 2012-08-18 | Disposition: A | Discharge status: Home / Self Care | Payer: Medicare Other | Source: Ambulatory Visit | Attending: Radiation Oncology | Admitting: Radiation Oncology

## 2012-08-18 ENCOUNTER — Encounter: Payer: Self-pay | Admitting: Radiation Oncology

## 2012-08-18 VITALS — BP 137/89 | HR 97 | Temp 97.2°F | Resp 20 | Wt 254.8 lb

## 2012-08-18 DIAGNOSIS — C61 Malignant neoplasm of prostate: Secondary | ICD-10-CM

## 2012-08-18 NOTE — Progress Notes (Signed)
Patient rad tx completes 40/40 prostate today,  Alert,oriented x3, no dysuria, still has frequency voiding, bowel movements okay today, only diarrhe on Saturday, no more hematuria either since last week, gave 1 month f/u appt card 1:56 PM

## 2012-08-18 NOTE — Progress Notes (Signed)
[facility]    Radiation Oncology [address] [place]     Maryln Gottron, M.D. [place], [place] [address]               Billie Lade, M.D., Ph.D. Phone: [phone]      Molli Hazard A. Kathrynn Running, M.D. Fax: [phone]      Radene Gunning, M.D., Ph.D.         Lurline Hare, M.D.         Grayland Jack, M.D Weekly Treatment Management Note  Name: [name]     MRN: [id number]        CSN: [id number] Date: 08/18/2012      DOB: [birth year]  CC: [name], MD         [place]    Status: Outpatient  Diagnosis: The encounter diagnosis was Prostate cancer.  Current Dose: 78 Gy  Current Fraction: 40  Planned Dose: 78 Gy  Narrative: [name] was seen today for weekly treatment management. The chart was checked and CBCT  were reviewed. Patient is happy to complete his radiation therapy today. He does have a fair amount of fatigue. He denies any problems with diarrhea this week. He has urinary frequency and some mild dysuria.  Review of patient's allergies indicates no known allergies. Current Outpatient Prescriptions  Medication Sig Dispense Refill  . allopurinol (ZYLOPRIM) 300 MG tablet Take 300 mg by mouth daily.      Marland Kitchen amLODipine (NORVASC) 10 MG tablet Take 1 tablet (10 mg total) by mouth daily.  90 tablet  4  . omeprazole (PRILOSEC) 20 MG capsule Take 20 mg by mouth daily.       No current facility-administered medications for this encounter.   Facility-Administered Medications Ordered in Other Encounters  Medication Dose Route Frequency Provider Last Rate Last Dose  . DOBUTamine (DOBUTREX) 1,000 mcg/mL in sodium chloride 0.9 % 150 mL infusion  40 mcg/kg Intravenous Continuous [name], MD 265 mL/hr at 05/05/12 1600 40 mcg/kg/min at 05/05/12 1600    Physical Examination:  weight is 254 lb 12.8 oz (115.577 kg). His oral temperature is 97.2 F (36.2 C). His blood pressure is 137/89 and his pulse is 97. His respiration is 20.    Wt Readings  from Last 3 Encounters:  08/18/12 254 lb 12.8 oz (115.577 kg)  08/11/12 256 lb 6.4 oz (116.302 kg)  08/04/12 257 lb 1.6 oz (116.62 kg)     Lungs - Normal respiratory effort, chest expands symmetrically. Lungs are clear to auscultation, no crackles or wheezes.  Heart has regular rhythm and rate  Abdomen is soft and non tender with normal bowel sounds  Assessment:  Patient tolerated treatments relatively well  Plan: Routine followup in one month.

## 2012-08-27 ENCOUNTER — Encounter: Payer: Self-pay | Admitting: Radiation Oncology

## 2012-08-27 NOTE — Progress Notes (Signed)
  Radiation Oncology         [phone]) [phone] ________________________________  Name: Kaleth Koy MRN: [id number]  Date: 08/27/2012  DOB: [birth year]  End of Treatment Note  Diagnosis:   Prostate cancer     Indication for treatment:  Definitive treatment       Radiation treatment dates:   06/22/2012 through 08/18/2012  Site/dose:   Prostate 78 Gy in 40 fractions  Beams/energy:   Intensity modulated radiation therapy, volumetric ARC therapy, image guided  Narrative: The patient tolerated radiation treatment relatively well.   He did have some fatigue towards the end of his therapy. He had minimal bladder and rectal discomfort during his therapy.  Plan: The patient has completed radiation treatment. The patient will return to radiation oncology clinic for routine followup in one month. I advised them to call or return sooner if they have any questions or concerns related to their recovery or treatment.  -----------------------------------  Billie Lade, PhD, MD

## 2012-08-27 NOTE — Progress Notes (Signed)
   Department of Radiation Oncology  Phone:  [phone] Fax:        [phone]  Intensity modulated radiation therapy device note  On 06/22/2012 the patient began his radiation therapy directed at the pelvis region. Patient had construction of his IMRT device on that day. This constitutes 1 IMRT device.  -----------------------------------  Billie Lade, PhD, MD

## 2012-09-14 ENCOUNTER — Ambulatory Visit
Admission: RE | Admit: 2012-09-14 | Discharge: 2012-09-14 | Disposition: A | Discharge status: Home / Self Care | Payer: Medicare Other | Source: Ambulatory Visit | Attending: Radiation Oncology | Admitting: Radiation Oncology

## 2012-09-14 ENCOUNTER — Encounter: Payer: Self-pay | Admitting: Radiation Oncology

## 2012-09-14 VITALS — BP 153/84 | HR 91 | Temp 97.0°F | Resp 16 | Wt 261.1 lb

## 2012-09-14 DIAGNOSIS — C61 Malignant neoplasm of prostate: Secondary | ICD-10-CM

## 2012-09-14 NOTE — Progress Notes (Signed)
  Radiation Oncology         [phone]) [phone] ________________________________  Name: [name] MRN: [id number]  Date: 09/14/2012  DOB: 06/21/1941  Follow-Up Visit Note  CC: [name], MD  Anner Crete, MD  Diagnosis:   Prostate Cancer  Interval Since Last Radiation:  4  weeks  Narrative:  The patient returns today for routine follow-up.  He is doing well this time except for fatigue. He denies any hematuria dysuria or bowel complaints.  He is inquiring about medication for erectile dysfunction and I recommended he discuss this with Dr. Annabell Howells with his upcoming appointment.                             ALLERGIES:  has No Known Allergies.     Physical Findings: The patient is in no acute distress. Patient is alert and oriented.  weight is 261 lb 1.6 oz (118.434 kg). His oral temperature is 97 F (36.1 C). His blood pressure is 153/84 and his pulse is 91. His respiration is 16. Marland Kitchen  No palpable supraclavicular adenopathy.  The lungs are clear. The heart has a regular rhythm and rate. The abdomen is soft and nontender with normal bowel sounds.    Radiographic Findings: No results found.  Impression:  The patient is recovering from the effects of radiation.  His only complaint is fatigue. He does have erectile dysfunction but had this issue before starting radiation therapy.  Plan:  Routine followup in 6 months.  _____________________________________  -----------------------------------  Billie Lade, PhD, MD

## 2012-09-14 NOTE — Progress Notes (Signed)
Patient presents to the clinic today unaccompanied for follow up appointment with Dr. Roselind Messier one month s/p radiation therapy. Patient is alert and oriented to person, place, and time. No distress noted. Steady gait noted. Pleasant affect noted. Patient denies pain at this time. Patient reports that his energy level is only very slowly improving. Patient's bp elevated despite taking bp medication at 0900. However, patient reports that when he took his bp at home this morning it was 130/72. Patient reports that he is not having to get up during the night to pass his water at all. Patient reports that he averages eight hours of sleep per night. Patient denies hematuria or burning with urination. Patient reports urine stream continues to be weak. Patient denies incontinence. Patient denies diarrhea and constipation. Patient denies nausea, vomiting, headache, dizziness, unintentional weight loss or night sweats. Reported all findings to Dr. Roselind Messier.

## 2012-09-28 ENCOUNTER — Other Ambulatory Visit: Payer: Self-pay | Admitting: Neurosurgery

## 2012-09-28 DIAGNOSIS — M545 Low back pain: Secondary | ICD-10-CM

## 2012-10-05 ENCOUNTER — Ambulatory Visit
Admission: RE | Admit: 2012-10-05 | Discharge: 2012-10-05 | Disposition: A | Discharge status: Home / Self Care | Payer: Medicare Other | Source: Ambulatory Visit | Attending: Neurosurgery | Admitting: Neurosurgery

## 2012-10-05 DIAGNOSIS — M545 Low back pain: Secondary | ICD-10-CM

## 2012-10-13 ENCOUNTER — Encounter: Payer: Self-pay | Admitting: *Deleted

## 2012-12-01 ENCOUNTER — Encounter: Payer: Self-pay | Admitting: Internal Medicine

## 2013-01-11 ENCOUNTER — Encounter (INDEPENDENT_AMBULATORY_CARE_PROVIDER_SITE_OTHER): Payer: Self-pay | Admitting: Surgery

## 2013-01-11 ENCOUNTER — Encounter (INDEPENDENT_AMBULATORY_CARE_PROVIDER_SITE_OTHER): Payer: Medicare Other | Admitting: Surgery

## 2013-01-11 ENCOUNTER — Ambulatory Visit (INDEPENDENT_AMBULATORY_CARE_PROVIDER_SITE_OTHER): Payer: Medicare Other | Admitting: Surgery

## 2013-01-11 VITALS — BP 132/84 | HR 64 | Temp 98.3°F | Resp 15 | Ht 73.0 in | Wt 242.4 lb

## 2013-01-11 DIAGNOSIS — L723 Sebaceous cyst: Secondary | ICD-10-CM

## 2013-01-11 DIAGNOSIS — L089 Local infection of the skin and subcutaneous tissue, unspecified: Secondary | ICD-10-CM

## 2013-01-11 HISTORY — PX: IRRIGATION AND DEBRIDEMENT SEBACEOUS CYST: SHX5255

## 2013-01-11 MED ORDER — DOXYCYCLINE HYCLATE 100 MG PO TABS: 100.0000 mg | ORAL_TABLET | Freq: Two times a day (BID) | ORAL | Status: DC

## 2013-01-11 NOTE — Progress Notes (Signed)
Subjective:     Patient ID: [name], male   DOB: [birth year], 72 y.o.   MRN: [id number]  HPI  [name]  1940-08-03 [id number]  Patient Care Team: [name], MD as PCP - General (Internal Medicine)  This patient is a 72 y.o.male who presents today for surgical evaluation at the request of Dr. Renne Crigler.   Reason for visit: Abscess on back.  Persistent infection  Pleasant elderly gentleman.  Formally anticoagulated for pulmonary emboli, but on none such now.  No history of prior skin infections.  Has had a knot on his left lower back for years.  Unfortunately, it became very swollen.  His wife said it was a size of a lemon.  He has been on intermittent antibiotics.  It spontaneously drained.  However began to get inflamed again despite being on antibiotics.  He was due to see [place] in a few weeks.  However, it worsened.  They wished him to be seen sooner.  Patient denies any trauma or fall.  No history of MRSA that he is aware of.  He does not smoke.  Patient Active Problem List   Diagnosis Date Noted  . Infected sebaceous cyst back 7x5x4cm 01/11/2013  . Abdominal aortic aneurysm 05/25/2012  . CAD (coronary artery disease) 05/25/2012  . Dyspnea 03/30/2012  . Hypertension 03/30/2012  . Bruit 03/30/2012  . Prostate cancer 02/03/2012  . PULMONARY EMBOLISM 11/26/2007  . GERD 11/26/2007  . ADENOCARCINOMA, COLON 11/25/2007  . Gout, Unspecified 11/25/2007  . ESOPHAGEAL STRICTURE 11/25/2007  . BARRETTS ESOPHAGUS 11/25/2007  . DUODENITIS, WITHOUT HEMORRHAGE 11/25/2007  . DIVERTICULOSIS OF COLON 11/25/2007  . ABDOMINAL PAIN, EPIGASTRIC 11/25/2007  . FOREIGN BODY IN ESOPHAGUS 11/25/2007  . PULMONARY EMBOLISM, HX OF 11/25/2007  . COLONIC POLYPS, ADENOMATOUS, HX OF 11/25/2007    Past Medical History  Diagnosis Date  . Gout   . GERD (gastroesophageal reflux disease)   . Barrett's esophagus 2009  . FH: colonic polyps   . Arthritis   . Hypertension   . Prostate cancer  12/2011    Found on routine screening PSA, planning for implants by Dr. Roselind Messier 10/16   . Pulmonary embolus 2005    Bilateral, coumadin x 1 years, inpatient for 5 days  . Subdural hematoma     Past Surgical History  Procedure Laterality Date  . Fracture left femur repair  09/2010    Went to [facility] x 3.5 weeks  . Tonsillectomy      as child.  . Craniotomy  04/03/2012    Procedure: CRANIOTOMY HEMATOMA EVACUATION SUBDURAL;  Surgeon: Maeola Harman, MD;  Location: [facility];  Service: Neurosurgery;  Laterality: Left;  Left Craniotomy for Evacuation of Subdural Hematoma    History   Social History  . Marital Status: Married    Spouse Name: N/A    Number of Children: 3  . Years of Education: N/A   Occupational History  .      Quarry manager   Social History Main Topics  . Smoking status: Former Smoker -- 1.00 packs/day for 20 years    Types: Cigarettes    Quit date: 12/23/1971  . Smokeless tobacco: Not on file  . Alcohol Use: 0.0 oz/week     Comment: 2 drinks per night, last drink was 03/29/2012  . Drug Use: No  . Sexually Active: Not on file   Other Topics Concern  . Not on file   Social History Narrative  . No narrative on file  Family History  Problem Relation Age of Onset  . Heart disease      No family history  . Macular degeneration Mother   . Osteoarthritis Mother   . Aneurysm Father     d/o 66 yo, ruptured AAA    Current Outpatient Prescriptions  Medication Sig Dispense Refill  . allopurinol (ZYLOPRIM) 300 MG tablet Take 300 mg by mouth daily.      Marland Kitchen amLODipine (NORVASC) 10 MG tablet Take 1 tablet (10 mg total) by mouth daily.  90 tablet  4  . HYDROcodone-acetaminophen (NORCO/VICODIN) 5-325 MG per tablet Take 1 tablet by mouth every 6 (six) hours as needed for pain.      Marland Kitchen omeprazole (PRILOSEC) 20 MG capsule Take 20 mg by mouth daily.      . sertraline (ZOLOFT) 50 MG tablet Take 50 mg by mouth daily.      Marland Kitchen doxycycline (VIBRA-TABS) 100 MG  tablet Take 1 tablet (100 mg total) by mouth 2 (two) times daily.  14 tablet  2   No current facility-administered medications for this visit.   Facility-Administered Medications Ordered in Other Visits  Medication Dose Route Frequency Provider Last Rate Last Dose  . DOBUTamine (DOBUTREX) 1,000 mcg/mL in sodium chloride 0.9 % 150 mL infusion  40 mcg/kg Intravenous Continuous [name], MD 265 mL/hr at 05/05/12 1600 40 mcg/kg/min at 05/05/12 1600     No Known Allergies  BP 132/84  Pulse 64  Temp(Src) 98.3 F (36.8 C) (Temporal)  Resp 15  Ht 6\' 1"  (1.854 m)  Wt 242 lb 6.4 oz (109.952 kg)  BMI 31.99 kg/m2  No results found.   Review of Systems  Constitutional: Negative for fever, chills and diaphoresis.  HENT: Negative for sore throat, trouble swallowing and neck pain.   Eyes: Negative for photophobia and visual disturbance.  Respiratory: Negative for choking and shortness of breath.   Cardiovascular: Negative for chest pain and palpitations.  Gastrointestinal: Negative for nausea, vomiting, abdominal distention, anal bleeding and rectal pain.  Genitourinary: Negative for dysuria, urgency, difficulty urinating and testicular pain.  Musculoskeletal: Positive for back pain. Negative for myalgias, arthralgias and gait problem.  Skin: Positive for wound. Negative for color change and rash.  Neurological: Negative for dizziness, speech difficulty, weakness and numbness.  Hematological: Negative for adenopathy.  Psychiatric/Behavioral: Negative for hallucinations, confusion and agitation.       Objective:   Physical Exam  Constitutional: He is oriented to person, place, and time. He appears well-developed and well-nourished. No distress.  HENT:  Head: Normocephalic.  Mouth/Throat: Oropharynx is clear and moist. No oropharyngeal exudate.  Eyes: Conjunctivae and EOM are normal. Pupils are equal, round, and reactive to light. No scleral icterus.  Neck: Normal range of motion.  No tracheal deviation present.  Cardiovascular: Normal rate, normal heart sounds and intact distal pulses.   Pulmonary/Chest: Effort normal. No respiratory distress.  Abdominal: Soft. He exhibits no distension. There is no tenderness. Hernia confirmed negative in the right inguinal area and confirmed negative in the left inguinal area.  Incisions clean with normal healing ridges.  No hernias  Musculoskeletal: Normal range of motion. He exhibits no tenderness.       Arms: Neurological: He is alert and oriented to person, place, and time. No cranial nerve deficit. He exhibits normal muscle tone. Coordination normal.  Skin: Skin is warm and dry. No rash noted. He is not diaphoretic.  Psychiatric: He has a normal mood and affect. His behavior is normal.  Assessment:     Inflamed sebaceous cyst with cellulitis.     Plan:     I recommended incision and drainage.  He agreed:  The pathophysiology of subcutaneous abscess and differential diagnosis was discussed.  Natural history progression was discussed.  The patient's symptoms are not adequately controlled.  Non-operative treatment has not healed the abscess.  Therefore, I recommended incision & drainage of the abscess to allow the infection to resolve and heal.  Technique, risks, benefits, alternatives discussed.  The patient expressed understanding & wished to proceed.  I placed a field block with local anaesthetic. This had to be reinforced a few times   I incised the skin over the abscess to release the infection.  I excised skin at the wound to have an adequate opening for drainage & prevent skin reclosure.  Sharply excised with a scalpel numerous complex cysts down to the level of the back fascia.  Eventually, I removed visual cystic material.  Redness improved.  I packed the wound with 4x4 gauze.    The patient tolerated the procedure.  We will have the patient return to clinic for close follow up to make sure the infection heals.His  wife is a Engineer, civil (consulting) and felt comfortable with doing the daily packing.  Doxycycline x7 days.

## 2013-01-11 NOTE — Patient Instructions (Signed)
WOUND CARE  It is important that the wound be kept open.   -Keeping the skin edges apart will allow the wound to gradually heal from the base upwards.   - If the skin edges of the wound close too early, a new fluid pocket can form and infection can occur. -This is the reason to pack deeper wounds with gauze or ribbon -This is why drained wounds cannot be sewed closed right away  A healthy wound should form a lining of bright red "beefy" granulating tissue that will help shrink the wound and help the edges grow new skin into it.   -A little mucus / yellow discharge is normal (the body's natural way to try and form a scab) and should be gently washed off with soap and water with daily dressing changes.  -Green or foul smelling drainage implies bacterial colonization and can slow wound healing - a short course of antibiotic ointment (3-5 days) can help it clear up.  Call the doctor if it does not improve or worsens  -Avoid use of antibiotic ointments for more than a week as they can slow wound healing over time.    -Sometimes other wound care products will be used to reduce need for dressing changes and/or help clean up dirty wounds -Sometimes the surgeon needs to debride the wound in the office to remove dead or infected tissue out of the wound so it can heal more quickly and safely.    Change the dressing at least once a day -Wash the wound with mild soap and water gently every day.  It is good to shower or bathe the wound to help it clean out. -Use clean 4x4 gauze for medium/large wounds or ribbon plain NU-gauze for smaller wounds (it does not need to be sterile, just clean) -Keep the raw wound moist with a little saline or KY (saline) gel on the gauze.  -A dry wound will take longer to heal.  -Keep the skin dry around the wound to prevent breakdown and irritation. -Pack the wound down to the base -The goal is to keep the skin apart, not overpack the wound -Use a Q-tip or blunt-tipped kabob  stick toothpick to push the gauze down to the base in narrow or deep wounds   -Cover with a clean gauze and tape -paper or Medipore tape tend to be gentle on the skin -rotate the orientation of the tape to avoid repeated stress/trauma on the skin -using an ACE or Coban wrap on wounds on arms or legs can be used instead.  Complete all antibiotics through the entire prescription to help the infection heal and prevent new places of infection   Returning the see the surgeon is helpful to follow the healing process and help the wound close as fast as possible.  Cyst Removal Your caregiver has removed a cyst. A cyst is a sac containing a semi-solid material. Cysts may occur any place on your body. They may remain small for years or gradually get larger. A sebaceous cyst is an enlarged (dilated) sweat gland filled with old sweat (sebum). Unattended, these may become large (the size of a softball) over several years time. These are often removed for improved appearance (cosmetic) reasons or before they become infected to form an abscess. An abscess is an infected cyst. HOME CARE INSTRUCTIONS   Keep your bandage clean and dry. You may change your bandage after 24 hours. If your bandage sticks, use warm water to gently loosen it. Pat the area  dry with a clean towel before putting on another bandage.  If possible, keep the area where the cyst was removed raised to relieve soreness, swelling, and promote healing.  If you have stitches, keep them clean and dry.  You may clean your stitches gently with a cotton swab dipped in warm soapy water.  Do not soak the area where the cyst was removed or go swimming. You may shower.  Do not overuse the area where your cyst was removed.  Return in 7 days or as directed to have your stitches removed.  Take medicines as instructed by your caregiver. SEEK IMMEDIATE MEDICAL CARE IF:   An oral temperature above 102 F (38.9 C) develops, not controlled by  medication.  Blood continues to soak through the bandage.  You have increasing pain in the area where your cyst was removed.  You have redness, swelling, pus, a bad smell, soreness (inflammation), or red streaks coming away from the stitches. These are signs of infection. MAKE SURE YOU:   Understand these instructions.  Will watch your condition.  Will get help right away if you are not doing well or get worse. Document Released: 06/07/2000 Document Revised: 09/02/2011 Document Reviewed: 10/01/2007 [facility] Patient Information 2014 [place], [place].  Managing Pain  Pain after surgery or related to activity is often due to strain/injury to muscle, tendon, nerves and/or incisions.  This pain is usually short-term and will improve in a few months.   Many people find it helpful to do the following things TOGETHER to help speed the process of healing and to get back to regular activity more quickly:  1. Avoid heavy physical activity a.  no lifting greater than 20 pounds b. Do not "push through" the pain.  Listen to your body and avoid positions and maneuvers than reproduce the pain c. Walking is okay as tolerated, but go slowly and stop when getting sore.  d. Remember: If it hurts to do it, then don't do it! 2. Take Anti-inflammatory medication  a. Take with food/snack around the clock for 1-2 weeks i. This helps the muscle and nerve tissues become less irritable and calm down faster b. Choose ONE of the following over-the-counter medications: i. Naproxen 220mg  tabs (ex. Aleve) 1-2 pills twice a day  ii. Ibuprofen 200mg  tabs (ex. Advil, Motrin) 3-4 pills with every meal and just before bedtime iii. Acetaminophen 500mg  tabs (Tylenol) 1-2 pills with every meal and just before bedtime 3. Use a Heating pad or Ice/Cold Pack a. 4-6 times a day b. May use warm bath/hottub  or showers 4. Try Gentle Massage and/or Stretching  a. at the area of pain many times a day b. stop if you feel pain -  do not overdo it  Try these steps together to help you body heal faster and avoid making things get worse.  Doing just one of these things may not be enough.    If you are not getting better after two weeks or are noticing you are getting worse, contact our office for further advice; we may need to re-evaluate you & see what other things we can do to help.

## 2013-01-13 ENCOUNTER — Ambulatory Visit (AMBULATORY_SURGERY_CENTER): Payer: Medicare Other | Admitting: *Deleted

## 2013-01-13 ENCOUNTER — Telehealth: Payer: Self-pay | Admitting: *Deleted

## 2013-01-13 VITALS — Ht 73.0 in | Wt 247.8 lb

## 2013-01-13 DIAGNOSIS — Z86010 Personal history of colonic polyps: Secondary | ICD-10-CM

## 2013-01-13 MED ORDER — MOVIPREP 100 G PO SOLR: 1.0000 | Freq: Once | ORAL | Status: DC

## 2013-01-13 NOTE — Progress Notes (Signed)
No egg or soy allergy. ewm No problems with past sedation. ewm Past colons with dr Juanda Chance- last 2009. ewm No home 02 use. ewm

## 2013-01-13 NOTE — Telephone Encounter (Signed)
OK to schedule EGD/colon at the same time 30 minute spot

## 2013-01-13 NOTE — Telephone Encounter (Signed)
Spoke with patient and moved time to 9:30 AM with double. Reviewed prep time changes with patient.

## 2013-01-13 NOTE — Telephone Encounter (Signed)
[name] was in today 01-13-13 for a pre visit for a colon scheduled for 01-27-13 at 11:30 am. His last colon was in 12-2007 with polyps and recall in 5 years.  He had an EGD 05-2010 with barrett's and letter states recall in 3 years which will be 05-2013. Pt and wife asked if he could have both procedures now, or if he had to wait until December for his EGD. Completed his pre visit , told them would ask MD, and we would contact him with answer. Did inform them if could do both, his date and times would change from instructions given today. Returned understanding.  Thanks,  Hilda Lias pre visit

## 2013-01-18 ENCOUNTER — Encounter: Payer: Self-pay | Admitting: Internal Medicine

## 2013-01-27 ENCOUNTER — Encounter: Payer: Self-pay | Admitting: Internal Medicine

## 2013-01-27 ENCOUNTER — Ambulatory Visit (AMBULATORY_SURGERY_CENTER): Payer: Medicare Other | Admitting: Internal Medicine

## 2013-01-27 ENCOUNTER — Other Ambulatory Visit: Payer: Self-pay

## 2013-01-27 ENCOUNTER — Encounter: Payer: Medicare Other | Admitting: Internal Medicine

## 2013-01-27 VITALS — BP 155/85 | HR 61 | Temp 98.5°F | Resp 22 | Ht 73.0 in | Wt 247.0 lb

## 2013-01-27 DIAGNOSIS — K2270 Barrett's esophagus without dysplasia: Secondary | ICD-10-CM

## 2013-01-27 DIAGNOSIS — K2970 Gastritis, unspecified, without bleeding: Secondary | ICD-10-CM

## 2013-01-27 DIAGNOSIS — Z860100 Personal history of colon polyps, unspecified: Secondary | ICD-10-CM

## 2013-01-27 DIAGNOSIS — K2990 Gastroduodenitis, unspecified, without bleeding: Secondary | ICD-10-CM

## 2013-01-27 DIAGNOSIS — D126 Benign neoplasm of colon, unspecified: Secondary | ICD-10-CM

## 2013-01-27 MED ORDER — SODIUM CHLORIDE 0.9 % IV SOLN: 500.0000 mL | INTRAVENOUS | Status: DC

## 2013-01-27 NOTE — Progress Notes (Signed)
Called to room to assist during endoscopic procedure.  Patient ID and intended procedure confirmed with present staff. Received instructions for my participation in the procedure from the performing physician.  

## 2013-01-27 NOTE — Progress Notes (Signed)
Patient did not experience any of the following events: a burn prior to discharge; a fall within the facility; wrong site/side/patient/procedure/implant event; or a hospital transfer or hospital admission upon discharge from the facility. (G8907) Patient did not have preoperative order for IV antibiotic SSI prophylaxis. (G8918)  

## 2013-01-27 NOTE — Op Note (Signed)
[facility] [address] [place], [address]   COLONOSCOPY PROCEDURE REPORT  PATIENT: Jeffery, Berry  MR#: [id number] BIRTHDATE: 01/20/1941 , 72  yrs. old GENDER: Male ENDOSCOPIST: Hart Carwin, MD REFERRED XB:[id number] Terri Piedra, M.D. PROCEDURE DATE:  01/27/2013 PROCEDURE:   Colonoscopy with snare polypectomy and Colonoscopy with cold biopsy polypectomy First Screening Colonoscopy - Avg.  risk and is 50 yrs.  old or older - No.  Prior Negative Screening - Now for repeat screening. N/A  History of Adenoma - Now for follow-up colonoscopy & has been > or = to 3 yrs.  Yes hx of adenoma.  Has been 3 or more years since last colonoscopy.  Polyps Removed Today? Yes. ASA CLASS:   Class II INDICATIONS:Tubular adenoma 2006, last colon 2009-hyperplastic polyp. MEDICATIONS: MAC sedation, administered by CRNA, Propofol (Diprivan), and Propofol (Diprivan) 650 mg IV  DESCRIPTION OF PROCEDURE:   After the risks benefits and alternatives of the procedure were thoroughly explained, informed consent was obtained.  normal digital exam        The LB [id number]  endoscope was introduced through the anus and advanced to the cecum, which was identified by both the appendix and ileocecal valve. No adverse events experienced.   The quality of the prep was good, using MoviPrep  The instrument was then slowly withdrawn as the colon was fully examined.      COLON FINDINGS: Four polypoid shaped sessile polyps measuring 20 mm in size were found in the ascending colon. at the ileocecal valve and toward the hepatic flexure,  A polypectomy was performed with cold forceps, with a cold snare and using snare cautery.The polyps were flat and carpet-like, very difficult to remove in one piece The resection was incomplete and the polyp tissue was completely retrieved. 90% of all polyps were resected, SPOT tatoo was applied to the most proximal polyp at thr hepatic flexure in case  pt undergoes right hemicolectome. Extensive diverticulosis of the left and the right colon was present.  Retroflexed views revealed no abnormalities. The time to cecum=15 minutes 58 seconds.  Withdrawal time=27 minutes 10 seconds.  The scope was withdrawn and the procedure completed. COMPLICATIONS: There were no complications.  ENDOSCOPIC IMPRESSION: Four sessile polyps measuring 20 mm in size were found in the ascending colon; polypectomy was performed with cold forceps, with a cold snare and using snare cautery ,polyps are carpet date and difficult to remove entirely. Most of the polyp tissue was removed or ablated with cautery. Tattoo was applied to the most proximal polyp at hepatic flexure in case patient undergoes right hemicolectomy Extensive diverticulosis of the left and right colon  RECOMMENDATIONS: Await pathology results no aspirin or Advil for 2 weeks Cipro 250 mg twice a day for 5 days Watch for bleeding or abdominal pain he too extensive nature of the procedure   eSigned:  Hart Carwin, MD 01/27/2013 11:26 AM   cc:   PATIENT NAME:  Jeffery, Berry MR#: [id number]

## 2013-01-27 NOTE — Op Note (Signed)
[facility] [address] [place], [address]   ENDOSCOPY PROCEDURE REPORT  PATIENT: [name], [name]  MR#: [id number] BIRTHDATE: May 08, 1941 , 72  yrs. old GENDER: Male ENDOSCOPIST: Hart Carwin, MD REFERRED BY:  Romero Liner, M.D. PROCEDURE DATE:  01/27/2013 PROCEDURE:  EGD w/ biopsy ASA CLASS:     Class II INDICATIONS:  history of Barrett's esophagus.  , last EGD-2009 ans 05/2010, taking PPI daily MEDICATIONS: MAC sedation, administered by CRNA and propofol (Diprivan) 250mg  IV TOPICAL ANESTHETIC: none  DESCRIPTION OF PROCEDURE: After the risks benefits and alternatives of the procedure were thoroughly explained, informed consent was obtained.  The LB [id number] endoscope was introduced through the mouth and advanced to the second portion of the duodenum. Without limitations.  The instrument was slowly withdrawn as the mucosa was fully examined.      esophagus: Esophageal mucosa appeared normal in the proximal and mid and distal esophagus. Z line was irregular but there was no stricture or esophagitis distal to the Z line was a 3 cm hiatal hernia. Biopsies were taken from GE junction to rule out dysplasia Stomach: Proximal stomach showed several fundic gland polyps a normal-appearing rugal folds. Gastric antrum had extensive intense erythema and mucosal hemorrhages suggestive of full and erosive gastritis 2 large RES were identified and were biopsied to rule out watermelon as stomach. Pyloric outlet was normal Duodenum duodenal bulb and descending duodenum was normal endoscope was then brought back into the stomach retroflexed and showed normal fundus and cardia[          The scope was then withdrawn from the patient and the procedure completed.  COMPLICATIONS: There were no complications. ENDOSCOPIC IMPRESSION:  Barrett's esophagus status post biopsies to rule out dysplasia No evidence of esophageal stricture 3 cm hiatal hernia Erosive  gastritis status post biopsies rule out watermelon stomach RECOMMENDATIONS: Await biopsy results continue PPI recall 3 years  REPEAT EXAM: 3 years  eSigned:  Hart Carwin, MD 01/27/2013 11:12 AM   CC:  PATIENT NAME:  [name], [name] MR#: [id number]

## 2013-01-27 NOTE — Patient Instructions (Addendum)
NO ASPIRIN, ASPIRIN PRODUCTS OR NSAIDS (MOTRIN,IBUPROFEN, ALEVE, NAPROSYN) FOR TWO WEEKS AUGUST 20,2014.   YOU HAD AN ENDOSCOPIC PROCEDURE TODAY AT THE [facility]: Refer to the procedure report that was given to you for any specific questions about what was found during the examination.  If the procedure report does not answer your questions, please call your gastroenterologist to clarify.  If you requested that your care partner not be given the details of your procedure findings, then the procedure report has been included in a sealed envelope for you to review at your convenience later.  YOU SHOULD EXPECT: Some feelings of bloating in the abdomen. Passage of more gas than usual.  Walking can help get rid of the air that was put into your GI tract during the procedure and reduce the bloating. If you had a lower endoscopy (such as a colonoscopy or flexible sigmoidoscopy) you may notice spotting of blood in your stool or on the toilet paper. If you underwent a bowel prep for your procedure, then you may not have a normal bowel movement for a few days.  DIET: Your first meal following the procedure should be a light meal and then it is ok to progress to your normal diet.  A half-sandwich or bowl of soup is an example of a good first meal.  Heavy or fried foods are harder to digest and may make you feel nauseous or bloated.  Likewise meals heavy in dairy and vegetables can cause extra gas to form and this can also increase the bloating.  Drink plenty of fluids but you should avoid alcoholic beverages for 24 hours.  ACTIVITY: Your care partner should take you home directly after the procedure.  You should plan to take it easy, moving slowly for the rest of the day.  You can resume normal activity the day after the procedure however you should NOT DRIVE or use heavy machinery for 24 hours (because of the sedation medicines used during the test).    SYMPTOMS TO REPORT IMMEDIATELY: A  gastroenterologist can be reached at any hour.  During normal business hours, 8:30 AM to 5:00 PM Monday through Friday, call [phone].  After hours and on weekends, please call the GI answering service at [phone] who will take a message and have the physician on call contact you.   Following lower endoscopy (colonoscopy or flexible sigmoidoscopy):  Excessive amounts of blood in the stool  Significant tenderness or worsening of abdominal pains  Swelling of the abdomen that is new, acute  Fever of 100F or higher  Following upper endoscopy (EGD)  Vomiting of blood or coffee ground material  New chest pain or pain under the shoulder blades  Painful or persistently difficult swallowing  New shortness of breath  Fever of 100F or higher  Black, tarry-looking stools  FOLLOW UP: If any biopsies were taken you will be contacted by phone or by letter within the next 1-3 weeks.  Call your gastroenterologist if you have not heard about the biopsies in 3 weeks.  Our staff will call the home number listed on your records the next business day following your procedure to check on you and address any questions or concerns that you may have at that time regarding the information given to you following your procedure. This is a courtesy call and so if there is no answer at the home number and we have not heard from you through the emergency physician on call, we will assume that  you have returned to your regular daily activities without incident.  SIGNATURES/CONFIDENTIALITY: You and/or your care partner have signed paperwork which will be entered into your electronic medical record.  These signatures attest to the fact that that the information above on your After Visit Summary has been reviewed and is understood.  Full responsibility of the confidentiality of this discharge information lies with you and/or your care-partner.

## 2013-01-27 NOTE — Progress Notes (Signed)
PATIENT AND WIFE DISCUSSED WITH DR. Juanda Chance THAT HE IS PRESENTLY ON DOXYCYCLINE TWICE DAILY. DR. Juanda Chance DISCONTINUING THE ORDER FOR CIPRO AT HOME. PATIENT AND WIFE VERBALIZED UNDERSTANDING.

## 2013-01-29 ENCOUNTER — Telehealth: Payer: Self-pay

## 2013-01-29 NOTE — Telephone Encounter (Signed)
  Follow up Call-  Call back number 01/27/2013  Post procedure Call Back phone  # [phone]  Permission to leave phone message Yes     Patient questions:  Do you have a fever, pain , or abdominal swelling? no Pain Score  0 *  Have you tolerated food without any problems? yes  Have you been able to return to your normal activities? yes  Do you have any questions about your discharge instructions: Diet   no Medications  no Follow up visit  no  Do you have questions or concerns about your Care? no  Actions: * If pain score is 4 or above: No action needed, pain <4.

## 2013-02-01 ENCOUNTER — Ambulatory Visit (INDEPENDENT_AMBULATORY_CARE_PROVIDER_SITE_OTHER): Payer: Medicare Other | Admitting: Surgery

## 2013-02-01 ENCOUNTER — Encounter: Payer: Self-pay | Admitting: Internal Medicine

## 2013-02-01 VITALS — BP 156/84 | HR 68 | Resp 14 | Ht 73.0 in | Wt 245.6 lb

## 2013-02-01 DIAGNOSIS — L089 Local infection of the skin and subcutaneous tissue, unspecified: Secondary | ICD-10-CM

## 2013-02-01 DIAGNOSIS — L723 Sebaceous cyst: Secondary | ICD-10-CM

## 2013-02-01 NOTE — Patient Instructions (Addendum)
° °WOUND CARE ° °It is important that the wound be kept open.   °-Keeping the skin edges apart will allow the wound to gradually heal from the base upwards.   °- If the skin edges of the wound close too early, a new fluid pocket can form and infection can occur. °-This is the reason to pack deeper wounds with gauze or ribbon °-This is why drained wounds cannot be sewed closed right away ° °A healthy wound should form a lining of bright red "beefy" granulating tissue that will help shrink the wound and help the edges grow new skin into it.   °-A little mucus / yellow discharge is normal (the body's natural way to try and form a scab) and should be gently washed off with soap and water with daily dressing changes.  °-Green or foul smelling drainage implies bacterial colonization and can slow wound healing - a short course of antibiotic ointment (3-5 days) can help it clear up.  Call the doctor if it does not improve or worsens ° -Avoid use of antibiotic ointments for more than a week as they can slow wound healing over time.    °-Sometimes other wound care products will be used to reduce need for dressing changes and/or help clean up dirty wounds °-Sometimes the surgeon needs to debride the wound in the office to remove dead or infected tissue out of the wound so it can heal more quickly and safely.   ° °Change the dressing at least once a day °-Wash the wound with mild soap and water gently every day.  It is good to shower or bathe the wound to help it clean out. °-Use clean 4x4 gauze for medium/large wounds or ribbon plain NU-gauze for smaller wounds (it does not need to be sterile, just clean) °-Keep the raw wound moist with a little saline or KY (saline) gel on the gauze.  °-A dry wound will take longer to heal.  °-Keep the skin dry around the wound to prevent breakdown and irritation. °-Pack the wound down to the base °-The goal is to keep the skin apart, not overpack the wound °-Use a Q-tip or blunt-tipped  kabob stick toothpick to push the gauze down to the base in narrow or deep wounds   °-Cover with a clean gauze and tape °-paper or Medipore tape tend to be gentle on the skin °-rotate the orientation of the tape to avoid repeated stress/trauma on the skin °-using an ACE or Coban wrap on wounds on arms or legs can be used instead. ° °Complete all antibiotics through the entire prescription to help the infection heal and prevent new places of infection  ° °Returning the see the surgeon is helpful to follow the healing process and help the wound close as fast as possible. ° °Abscess °An abscess is an infected area that contains a collection of pus and debris. It can occur in almost any part of the body. An abscess is also known as a furuncle or boil. °CAUSES  °An abscess occurs when tissue gets infected. This can occur from blockage of oil or sweat glands, infection of hair follicles, or a minor injury to the skin. As the body tries to fight the infection, pus collects in the area and creates pressure under the skin. This pressure causes pain. People with weakened immune systems have difficulty fighting infections and get certain abscesses more often.  °SYMPTOMS °Usually an abscess develops on the skin and becomes a painful mass that is red,   warm, and tender. If the abscess forms under the skin, you may feel a moveable soft area under the skin. Some abscesses break open (rupture) on their own, but most will continue to get worse without care. The infection can spread deeper into the body and eventually into the bloodstream, causing you to feel ill.  °DIAGNOSIS  °Your caregiver will take your medical history and perform a physical exam. A sample of fluid may also be taken from the abscess to determine what is causing your infection. °TREATMENT  °Your caregiver may prescribe antibiotic medicines to fight the infection. However, taking antibiotics alone usually does not cure an abscess. Your caregiver may need to make a  small cut (incision) in the abscess to drain the pus. In some cases, gauze is packed into the abscess to reduce pain and to continue draining the area. °HOME CARE INSTRUCTIONS  °· Only take over-the-counter or prescription medicines for pain, discomfort, or fever as directed by your caregiver. °· If you were prescribed antibiotics, take them as directed. Finish them even if you start to feel better. °· If gauze is used, follow your caregiver's directions for changing the gauze. °· To avoid spreading the infection: °· Keep your draining abscess covered with a bandage. °· Wash your hands well. °· Do not share personal care items, towels, or whirlpools with others. °· Avoid skin contact with others. °· Keep your skin and clothes clean around the abscess. °· Keep all follow-up appointments as directed by your caregiver. °SEEK MEDICAL CARE IF:  °· You have increased pain, swelling, redness, fluid drainage, or bleeding. °· You have muscle aches, chills, or a general ill feeling. °· You have a fever. °MAKE SURE YOU:  °· Understand these instructions. °· Will watch your condition. °· Will get help right away if you are not doing well or get worse. °Document Released: 03/20/2005 Document Revised: 12/10/2011 Document Reviewed: 08/23/2011 °[employer]® Patient Information ©2014 [employer]. ° °

## 2013-02-01 NOTE — Progress Notes (Signed)
Subjective:     Patient ID: [name], male   DOB: 07-02-1940, 72 y.o.   MRN: [id number]  HPI  [name]  02-01-1941 [id number]  Patient Care Team: [name], MD as PCP - General (Internal Medicine)  This patient is a 72 y.o.male who presents today for surgical evaluation Status post incision and drainage of infected back sebaceous cyst 01/11/2013  Reason for visit: Followup  The patient comes in today with his wife.  Getting dressing changes twice a day.  Some thick drainage at times but not severe.  Took antibiotics for two weeks.  Has been off antibiotics for the past five days.  Feeling fine.  Denies pain.  Denies bleeding.  Denies any new lesions.  Patient Active Problem List   Diagnosis Date Noted  . Infected sebaceous cyst back 7x5x4cm 01/11/2013  . Abdominal aortic aneurysm 05/25/2012  . CAD (coronary artery disease) 05/25/2012  . Dyspnea 03/30/2012  . Hypertension 03/30/2012  . Bruit 03/30/2012  . Prostate cancer 02/03/2012  . PULMONARY EMBOLISM 11/26/2007  . GERD 11/26/2007  . ADENOCARCINOMA, COLON 11/25/2007  . Gout, Unspecified 11/25/2007  . ESOPHAGEAL STRICTURE 11/25/2007  . BARRETTS ESOPHAGUS 11/25/2007  . DUODENITIS, WITHOUT HEMORRHAGE 11/25/2007  . DIVERTICULOSIS OF COLON 11/25/2007  . ABDOMINAL PAIN, EPIGASTRIC 11/25/2007  . FOREIGN BODY IN ESOPHAGUS 11/25/2007  . PULMONARY EMBOLISM, HX OF 11/25/2007  . COLONIC POLYPS, ADENOMATOUS, HX OF 11/25/2007    Past Medical History  Diagnosis Date  . Gout   . GERD (gastroesophageal reflux disease)   . Barrett's esophagus 2009  . FH: colonic polyps   . Arthritis   . Hypertension   . Prostate cancer 12/2011    Found on routine screening PSA, planning for implants by Dr. Roselind Messier 10/16   . Pulmonary embolus 2005    Bilateral, coumadin x 1 years, inpatient for 5 days  . Subdural hematoma     Past Surgical History  Procedure Laterality Date  . Fracture left femur repair  09/2010    Went to  [facility] x 3.5 weeks  . Tonsillectomy      as child.  . Craniotomy  04/03/2012    Procedure: CRANIOTOMY HEMATOMA EVACUATION SUBDURAL;  Surgeon: Maeola Harman, MD;  Location: [facility];  Service: Neurosurgery;  Laterality: Left;  Left Craniotomy for Evacuation of Subdural Hematoma  . Colonoscopy    . Polypectomy    . Abcessed cyst removed from back  01-11-13    History   Social History  . Marital Status: Married    Spouse Name: N/A    Number of Children: 3  . Years of Education: N/A   Occupational History  .      Quarry manager   Social History Main Topics  . Smoking status: Former Smoker -- 1.00 packs/day for 20 years    Types: Cigarettes    Quit date: 12/23/1971  . Smokeless tobacco: Never Used  . Alcohol Use: 3.6 oz/week    6 Cans of beer per week     Comment: 2 drinks per night, last drink was 03/29/2012, 6 pack of beer a week  . Drug Use: No  . Sexually Active: Not on file   Other Topics Concern  . Not on file   Social History Narrative  . No narrative on file    Family History  Problem Relation Age of Onset  . Heart disease      No family history  . Macular degeneration Mother   . Osteoarthritis Mother   .  Aneurysm Father     d/o 20 yo, ruptured AAA  . Colon cancer Neg Hx     Current Outpatient Prescriptions  Medication Sig Dispense Refill  . allopurinol (ZYLOPRIM) 300 MG tablet Take 300 mg by mouth daily.      Marland Kitchen amLODipine (NORVASC) 10 MG tablet Take 1 tablet (10 mg total) by mouth daily.  90 tablet  4  . HYDROcodone-acetaminophen (NORCO/VICODIN) 5-325 MG per tablet Take 1 tablet by mouth every 6 (six) hours as needed for pain.      Marland Kitchen omeprazole (PRILOSEC) 20 MG capsule Take 20 mg by mouth daily.      . sertraline (ZOLOFT) 50 MG tablet Take 50 mg by mouth daily.      Marland Kitchen amoxicillin-clavulanate (AUGMENTIN) 875-125 MG per tablet       . doxycycline (VIBRA-TABS) 100 MG tablet Take 1 tablet (100 mg total) by mouth 2 (two) times daily.  14 tablet   2   No current facility-administered medications for this visit.   Facility-Administered Medications Ordered in Other Visits  Medication Dose Route Frequency Provider Last Rate Last Dose  . DOBUTamine (DOBUTREX) 1,000 mcg/mL in sodium chloride 0.9 % 150 mL infusion  40 mcg/kg Intravenous Continuous [name], MD 265 mL/hr at 05/05/12 1600 40 mcg/kg/min at 05/05/12 1600     No Known Allergies  BP 156/84  Pulse 68  Resp 14  Ht 6\' 1"  (1.854 m)  Wt 245 lb 9.6 oz (111.403 kg)  BMI 32.41 kg/m2  No results found.   Review of Systems  Constitutional: Negative for fever, chills and diaphoresis.  HENT: Negative for nosebleeds, sore throat, facial swelling, mouth sores, trouble swallowing, neck pain and ear discharge.   Eyes: Negative for photophobia, discharge and visual disturbance.  Respiratory: Negative for choking, chest tightness, shortness of breath and stridor.   Cardiovascular: Negative for chest pain and palpitations.  Gastrointestinal: Negative for nausea, vomiting, abdominal pain, diarrhea, constipation, blood in stool, abdominal distention, anal bleeding and rectal pain.  Endocrine: Negative for cold intolerance and heat intolerance.  Genitourinary: Negative for dysuria, urgency, difficulty urinating and testicular pain.  Musculoskeletal: Negative for myalgias, back pain, arthralgias and gait problem.  Skin: Negative for color change, pallor, rash and wound.  Allergic/Immunologic: Negative for environmental allergies and food allergies.  Neurological: Negative for dizziness, speech difficulty, weakness, numbness and headaches.  Hematological: Negative for adenopathy. Does not bruise/bleed easily.  Psychiatric/Behavioral: Negative for hallucinations, confusion and agitation.       Objective:   Physical Exam  Constitutional: He is oriented to person, place, and time. He appears well-developed and well-nourished. No distress.  HENT:  Head: Normocephalic.    Mouth/Throat: Oropharynx is clear and moist. No oropharyngeal exudate.  Eyes: Conjunctivae and EOM are normal. Pupils are equal, round, and reactive to light. No scleral icterus.  Neck: Normal range of motion. No tracheal deviation present.  Cardiovascular: Normal rate, normal heart sounds and intact distal pulses.   Pulmonary/Chest: Effort normal. No respiratory distress.  Abdominal: Soft. He exhibits no distension. There is no tenderness. Hernia confirmed negative in the right inguinal area and confirmed negative in the left inguinal area.  Incisions clean with normal healing ridges.  No hernias  Musculoskeletal: Normal range of motion. He exhibits no tenderness.       Arms: Neurological: He is alert and oriented to person, place, and time. No cranial nerve deficit. He exhibits normal muscle tone. Coordination normal.  Skin: Skin is warm and dry. No rash noted.  He is not diaphoretic.  Psychiatric: He has a normal mood and affect. His behavior is normal.       Assessment:     Markedly improved status post incision and drainage of large back abscess/infected sebaceous cyst     Plan:     Increase activity as tolerated to regular activity.  Low impact exercise such as walking an hour a day at least ideal.  Do not push through pain.  Continue dressing changes.  Okay to go down to once a day.  No need to restart antibiotics.  I would like to follow this until it closes fully.  Return to clinic q2-3 weeks.   Instructions discussed.  Followup with primary care physician for other health issues as would normally be done.  Questions answered.  The patient expressed understanding and appreciation

## 2013-02-05 ENCOUNTER — Encounter (INDEPENDENT_AMBULATORY_CARE_PROVIDER_SITE_OTHER): Payer: Medicare Other | Admitting: Surgery

## 2013-02-08 ENCOUNTER — Telehealth: Payer: Self-pay | Admitting: Internal Medicine

## 2013-02-08 NOTE — Telephone Encounter (Signed)
Spoke with patient and his wife. Gave them results as per letter on 02/01/13. Patient is concerned about waiting 5 years to repeat colonoscopy. 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 Patient wants to know if he needs to schedule an OV with Dr. Juanda Chance. Please, advise.

## 2013-02-08 NOTE — Telephone Encounter (Signed)
I have reviewed the colonoscopy report and the Path report. I agree that his polyps need to be rechecked in 3 years becase there were more than one and they were flat. 3 year recall. I apologize and appreciate him calling back. His EGD for Barrett's is also in 3 years

## 2013-02-09 NOTE — Telephone Encounter (Signed)
Patient notified and recall changed in EPIC.

## 2013-02-09 NOTE — Telephone Encounter (Signed)
Patient cannot come to phone. Will try again later.

## 2013-02-24 ENCOUNTER — Encounter (INDEPENDENT_AMBULATORY_CARE_PROVIDER_SITE_OTHER): Payer: Medicare Other | Admitting: Surgery

## 2013-03-01 ENCOUNTER — Encounter (INDEPENDENT_AMBULATORY_CARE_PROVIDER_SITE_OTHER): Payer: Self-pay | Admitting: Surgery

## 2013-03-01 ENCOUNTER — Ambulatory Visit (INDEPENDENT_AMBULATORY_CARE_PROVIDER_SITE_OTHER): Payer: Medicare Other | Admitting: Surgery

## 2013-03-01 VITALS — BP 148/74 | HR 68 | Resp 16 | Ht 73.0 in | Wt 246.2 lb

## 2013-03-01 DIAGNOSIS — L723 Sebaceous cyst: Secondary | ICD-10-CM

## 2013-03-01 DIAGNOSIS — L089 Local infection of the skin and subcutaneous tissue, unspecified: Secondary | ICD-10-CM

## 2013-03-01 NOTE — Progress Notes (Signed)
Subjective:     Patient ID: [name], male   DOB: 1940-06-28, 72 y.o.   MRN: [id number]  HPI   [name]  December 04, 1940 [id number]  Patient Care Team: [name], MD as PCP - General (Internal Medicine)  This patient is a 72 y.o.male who presents today for surgical evaluation Status post incision and drainage of infected back sebaceous cyst 01/11/2013  Reason for visit: Followup  The patient comes in today with his wife.  Getting dressing changes twice a day.  Some thick drainage at times but not severe.  Took antibiotics for two weeks.  Has been off antibiotics for the past five days.  Feeling fine.  Denies pain.  Denies bleeding.  Denies any new lesions.  Patient Active Problem List   Diagnosis Date Noted  . Infected sebaceous cyst back 7x5x4cm s/p I&D [id number] 01/11/2013  . Abdominal aortic aneurysm 05/25/2012  . CAD (coronary artery disease) 05/25/2012  . Dyspnea 03/30/2012  . Hypertension 03/30/2012  . Bruit 03/30/2012  . Prostate cancer 02/03/2012  . PULMONARY EMBOLISM 11/26/2007  . GERD 11/26/2007  . ADENOCARCINOMA, COLON 11/25/2007  . Gout, Unspecified 11/25/2007  . ESOPHAGEAL STRICTURE 11/25/2007  . BARRETTS ESOPHAGUS 11/25/2007  . DUODENITIS, WITHOUT HEMORRHAGE 11/25/2007  . DIVERTICULOSIS OF COLON 11/25/2007  . ABDOMINAL PAIN, EPIGASTRIC 11/25/2007  . FOREIGN BODY IN ESOPHAGUS 11/25/2007  . PULMONARY EMBOLISM, HX OF 11/25/2007  . COLONIC POLYPS, ADENOMATOUS, HX OF 11/25/2007    Past Medical History  Diagnosis Date  . Gout   . GERD (gastroesophageal reflux disease)   . Barrett's esophagus 2009  . FH: colonic polyps   . Arthritis   . Hypertension   . Prostate cancer 12/2011    Found on routine screening PSA, planning for implants by Dr. Roselind Messier 10/16   . Pulmonary embolus 2005    Bilateral, coumadin x 1 years, inpatient for 5 days  . Subdural hematoma   . Infected sebaceous cyst back 7x5x4cm s/p I&D [id number] 01/11/2013    Past Surgical  History  Procedure Laterality Date  . Fracture left femur repair  09/2010    Went to [facility] x 3.5 weeks  . Tonsillectomy      as child.  . Craniotomy  04/03/2012    Procedure: CRANIOTOMY HEMATOMA EVACUATION SUBDURAL;  Surgeon: Maeola Harman, MD;  Location: [facility];  Service: Neurosurgery;  Laterality: Left;  Left Craniotomy for Evacuation of Subdural Hematoma  . Colonoscopy    . Polypectomy    . Abcessed cyst removed from back  01-11-13    History   Social History  . Marital Status: Married    Spouse Name: [name]    Number of Children: 3  . Years of Education: [name]   Occupational History  .      Quarry manager   Social History Main Topics  . Smoking status: Former Smoker -- 1.00 packs/day for 20 years    Types: Cigarettes    Quit date: 12/23/1971  . Smokeless tobacco: Never Used  . Alcohol Use: 3.6 oz/week    6 Cans of beer per week     Comment: 2 drinks per night, last drink was 03/29/2012, 6 pack of beer a week  . Drug Use: No  . Sexual Activity: Not on file   Other Topics Concern  . Not on file   Social History Narrative   Originally from [place].  Retired to [employer] because he was tired of all the snow.    Family History  Problem  Relation Age of Onset  . Heart disease      No family history  . Macular degeneration Mother   . Osteoarthritis Mother   . Aneurysm Father     d/o 21 yo, ruptured AAA  . Colon cancer Neg Hx     Current Outpatient Prescriptions  Medication Sig Dispense Refill  . allopurinol (ZYLOPRIM) 300 MG tablet Take 300 mg by mouth daily.      Marland Kitchen amLODipine (NORVASC) 10 MG tablet Take 1 tablet (10 mg total) by mouth daily.  90 tablet  4  . amoxicillin-clavulanate (AUGMENTIN) 875-125 MG per tablet       . BUTRANS 5 MCG/HR PTWK patch       . omeprazole (PRILOSEC) 20 MG capsule Take 20 mg by mouth daily.      . sertraline (ZOLOFT) 50 MG tablet Take 50 mg by mouth daily.       No current facility-administered medications for  this visit.   Facility-Administered Medications Ordered in Other Visits  Medication Dose Route Frequency Provider Last Rate Last Dose  . DOBUTamine (DOBUTREX) 1,000 mcg/mL in sodium chloride 0.9 % 150 mL infusion  40 mcg/kg Intravenous Continuous [name], MD 265 mL/hr at 05/05/12 1600 40 mcg/kg/min at 05/05/12 1600     No Known Allergies  BP 148/74  Pulse 68  Resp 16  Ht 6\' 1"  (1.854 m)  Wt 246 lb 3.2 oz (111.676 kg)  BMI 32.49 kg/m2  No results found.   Review of Systems  Constitutional: Negative for fever, chills and diaphoresis.  HENT: Negative for nosebleeds, sore throat, facial swelling, mouth sores, trouble swallowing, neck pain and ear discharge.   Eyes: Negative for photophobia, discharge and visual disturbance.  Respiratory: Negative for choking, chest tightness, shortness of breath and stridor.   Cardiovascular: Negative for chest pain and palpitations.  Gastrointestinal: Negative for nausea, vomiting, abdominal pain, diarrhea, constipation, blood in stool, abdominal distention, anal bleeding and rectal pain.  Endocrine: Negative for cold intolerance and heat intolerance.  Genitourinary: Negative for dysuria, urgency, difficulty urinating and testicular pain.  Musculoskeletal: Negative for myalgias, back pain, arthralgias and gait problem.  Skin: Negative for color change, pallor, rash and wound.  Allergic/Immunologic: Negative for environmental allergies and food allergies.  Neurological: Negative for dizziness, speech difficulty, weakness, numbness and headaches.  Hematological: Negative for adenopathy. Does not bruise/bleed easily.  Psychiatric/Behavioral: Negative for hallucinations, confusion and agitation.       Objective:   Physical Exam  Constitutional: He is oriented to person, place, and time. He appears well-developed and well-nourished. No distress.  HENT:  Head: Normocephalic.  Mouth/Throat: Oropharynx is clear and moist. No oropharyngeal  exudate.  Eyes: Conjunctivae and EOM are normal. Pupils are equal, round, and reactive to light. No scleral icterus.  Neck: Normal range of motion. No tracheal deviation present.  Cardiovascular: Normal rate, normal heart sounds and intact distal pulses.   Pulmonary/Chest: Effort normal. No respiratory distress.  Abdominal: Soft. He exhibits no distension. There is no tenderness. Hernia confirmed negative in the right inguinal area and confirmed negative in the left inguinal area.  Incisions clean with normal healing ridges.  No hernias  Musculoskeletal: Normal range of motion. He exhibits no tenderness.       Arms: Neurological: He is alert and oriented to person, place, and time. No cranial nerve deficit. He exhibits normal muscle tone. Coordination normal.  Skin: Skin is warm and dry. No rash noted. He is not diaphoretic.  Psychiatric: He has a  normal mood and affect. His behavior is normal.       Assessment:     Healed status post incision and drainage of large back abscess/infected sebaceous cyst     Plan:     Regular activity.  Low impact exercise such as walking an hour a day at least ideal.  Do not push through pain.  No need to restart antibiotics.  Return to clinic as needed.   Instructions discussed.  Followup with primary care physician for other health issues as would normally be done.  Questions answered.  The patient expressed understanding and appreciation

## 2013-03-01 NOTE — Patient Instructions (Addendum)
Epidermal Cyst An epidermal cyst is sometimes called a sebaceous cyst, epidermal inclusion cyst, or infundibular cyst. These cysts usually contain a substance that looks "pasty" or "cheesy" and may have a bad smell. This substance is a protein called keratin. Epidermal cysts are usually found on the face, neck, or trunk. They may also occur in the vaginal area or other parts of the genitalia of both men and women. Epidermal cysts are usually small, painless, slow-growing bumps or lumps that move freely under the skin. It is important not to try to pop them. This may cause an infection and lead to tenderness and swelling. CAUSES  Epidermal cysts may be caused by a deep penetrating injury to the skin or a plugged hair follicle, often associated with acne. SYMPTOMS  Epidermal cysts can become inflamed and cause:  Redness.  Tenderness.  Increased temperature of the skin over the bumps or lumps.  Grayish-white, bad smelling material that drains from the bump or lump. DIAGNOSIS  Epidermal cysts are easily diagnosed by your caregiver during an exam. Rarely, a tissue sample (biopsy) may be taken to rule out other conditions that may resemble epidermal cysts. TREATMENT   Epidermal cysts often get better and disappear on their own. They are rarely ever cancerous.  If a cyst becomes infected, it may become inflamed and tender. This may require opening and draining the cyst. Treatment with antibiotics may be necessary. When the infection is gone, the cyst may be removed with minor surgery.  Small, inflamed cysts can often be treated with antibiotics or by injecting steroid medicines.  Sometimes, epidermal cysts become large and bothersome. If this happens, surgical removal in your caregiver's office may be necessary. HOME CARE INSTRUCTIONS  Only take over-the-counter or prescription medicines as directed by your caregiver.  Take your antibiotics as directed. Finish them even if you start to feel  better. SEEK MEDICAL CARE IF:   Your cyst becomes tender, red, or swollen.  Your condition is not improving or is getting worse.  You have any other questions or concerns. MAKE SURE YOU:  Understand these instructions.  Will watch your condition.  Will get help right away if you are not doing well or get worse. Document Released: 05/11/2004 Document Revised: 09/02/2011 Document Reviewed: 12/17/2010 [facility] Patient Information 2014 [place], [place].  Scar Minimization You will have a scar anytime you have surgery and a cut is made in the skin or you have something removed from your skin (mole, skin cancer, cyst). Although scars are unavoidable following surgery, there are ways to minimize their appearance. It is important to follow all the instructions you receive from your caregiver about wound care. How your wound heals will influence the appearance of your scar. If you do not follow the wound care instructions as directed, complications such as infection may occur. Wound instructions include keeping the wound clean, moist, and not letting the wound form a scab. Some people form scars that are raised and lumpy (hypertrophic) or larger than the initial wound (keloidal). HOME CARE INSTRUCTIONS   Follow wound care instructions as directed.  Keep the wound clean by washing it with soap and water.  Keep the wound moist with provided antibiotic cream or petroleum jelly until completely healed. Moisten twice a day for about 2 weeks.  Get stitches (sutures) taken out at the scheduled time.  Avoid touching or manipulating your wound unless needed. Wash your hands thoroughly before and after touching your wound.  Follow all restrictions such as limits on exercise  or work. This depends on where your scar is located.  Keep the scar protected from sunburn. Cover the scar with sunscreen/sunblock with SPF 30 or higher.  Gently massage the scar using a circular motion to help minimize the  appearance of the scar. Do this only after the wound has closed and all the sutures have been removed.  For hypertrophic or keloidal scars, there are several ways to treat and minimize their appearance. Methods include compression therapy, intralesional corticosteroids, laser therapy, or surgery. These methods are performed by your caregiver. Remember that the scar may appear lighter or darker than your normal skin color. This difference in color should even out with time. SEEK MEDICAL CARE IF:   You have a fever.  You develop signs of infection such as pain, redness, pus, and warmth.  You have questions or concerns. Document Released: 11/28/2009 Document Revised: 09/02/2011 Document Reviewed: 11/28/2009 [facility] Patient Information 2014 [place], [place].

## 2013-03-12 ENCOUNTER — Encounter: Payer: Self-pay | Admitting: Oncology

## 2013-03-15 ENCOUNTER — Ambulatory Visit
Admission: RE | Admit: 2013-03-15 | Discharge: 2013-03-15 | Disposition: A | Discharge status: Home / Self Care | Payer: Medicare Other | Source: Ambulatory Visit | Attending: Radiation Oncology | Admitting: Radiation Oncology

## 2013-03-15 ENCOUNTER — Encounter: Payer: Self-pay | Admitting: Radiation Oncology

## 2013-03-15 VITALS — BP 159/76 | HR 73 | Temp 98.1°F | Ht 73.0 in | Wt 250.1 lb

## 2013-03-15 DIAGNOSIS — C61 Malignant neoplasm of prostate: Secondary | ICD-10-CM

## 2013-03-15 NOTE — Progress Notes (Signed)
[name] here with his wife for follow up after treatment to his prostate.  He does have pain in his lower back that he is rating at a 6/10.  He said the pain started a year ago and he is seeing a rheumatologists.  He is wearing a butrans patch and using norco which he says doesn't help.  The rheumatologist just increased the butran.  He states that he occasionally has urinary frequency.  He denies nocturia, hematuria and dysuria.  He does have fatigue.

## 2013-03-15 NOTE — Progress Notes (Signed)
  Radiation Oncology         [phone]) [phone] ________________________________  Name: [name] MRN: [id number]  Date: 03/15/2013  DOB: 1940-08-20  Follow-Up Visit Note  CC: [name], MD  Bjorn Pippin, MD  Diagnosis:   Stage TI C. Gleason's 7 adenocarcinoma the prostate  Interval Since Last Radiation:  7  months  Narrative:  The patient returns today for routine follow-up.  He is doing well without complaints except for chronic low back pain.  This is interfering with his ability to play golf.  He denies any hematuria dysuria or nocturia. Patient denies any rectal bleeding or bowel problems.  Last PSA through Dr. Belva Crome office was down to 1.66 from a pre-treatment level of 5.21.                              ALLERGIES:  has No Known Allergies.  Meds: Current Outpatient Prescriptions  Medication Sig Dispense Refill  . allopurinol (ZYLOPRIM) 300 MG tablet Take 300 mg by mouth daily.      Marland Kitchen amLODipine (NORVASC) 10 MG tablet Take 1 tablet (10 mg total) by mouth daily.  90 tablet  4  . BUTRANS 5 MCG/HR PTWK patch once a week.       Marland Kitchen HYDROcodone-acetaminophen (NORCO/VICODIN) 5-325 MG per tablet 1 tablet as needed.       Marland Kitchen omeprazole (PRILOSEC) 20 MG capsule Take 20 mg by mouth daily.      . sertraline (ZOLOFT) 50 MG tablet Take 50 mg by mouth daily.       No current facility-administered medications for this encounter.   Facility-Administered Medications Ordered in Other Encounters  Medication Dose Route Frequency Provider Last Rate Last Dose  . DOBUTamine (DOBUTREX) 1,000 mcg/mL in sodium chloride 0.9 % 150 mL infusion  40 mcg/kg Intravenous Continuous [name], MD 265 mL/hr at 05/05/12 1600 40 mcg/kg/min at 05/05/12 1600    Physical Findings: The patient is in no acute distress. Patient is alert and oriented.  height is 6\' 1"  (1.854 m) and weight is 250 lb 1.6 oz (113.445 kg). His temperature is 98.1 F (36.7 C). His blood pressure is 159/76 and his pulse is 73.  Marland Kitchen  No palpable supraclavicular or axillary adenopathy. The lungs are clear to auscultation. The heart has regular rhythm and rate. The abdomen is soft and nontender with normal bowel sounds.  Lab Findings: Lab Results  Component Value Date   WBC 7.3 04/05/2012   HGB 13.6 04/05/2012   HCT 39.6 04/05/2012   MCV 94.7 04/05/2012   PLT 177 04/05/2012      Radiographic Findings: No results found.  Impression:  The patient is recovering from the effects of radiation.  Nice PSA response as above  Plan:  When necessary followup in radiation oncology. Patient will be seen by urology next month with exam and PSA blood test.  _____________________________________  -----------------------------------  Billie Lade, PhD, MD

## 2013-04-22 ENCOUNTER — Ambulatory Visit (HOSPITAL_COMMUNITY): Payer: Medicare Other | Attending: Cardiology

## 2013-04-22 DIAGNOSIS — I714 Abdominal aortic aneurysm, without rupture, unspecified: Secondary | ICD-10-CM

## 2013-04-22 DIAGNOSIS — Z87891 Personal history of nicotine dependence: Secondary | ICD-10-CM | POA: Insufficient documentation

## 2013-04-22 DIAGNOSIS — I10 Essential (primary) hypertension: Secondary | ICD-10-CM | POA: Insufficient documentation

## 2013-04-29 ENCOUNTER — Other Ambulatory Visit: Payer: Self-pay

## 2013-07-23 ENCOUNTER — Other Ambulatory Visit: Payer: Self-pay | Admitting: *Deleted

## 2013-07-23 MED ORDER — AMLODIPINE BESYLATE 10 MG PO TABS: 10.0000 mg | ORAL_TABLET | Freq: Every day | ORAL | Status: DC

## 2013-08-13 ENCOUNTER — Encounter: Payer: Self-pay | Admitting: Neurology

## 2013-08-13 ENCOUNTER — Ambulatory Visit (INDEPENDENT_AMBULATORY_CARE_PROVIDER_SITE_OTHER): Payer: Medicare Other | Admitting: Neurology

## 2013-08-13 VITALS — BP 159/79 | HR 96 | Temp 96.6°F | Ht 71.5 in | Wt 254.0 lb

## 2013-08-13 DIAGNOSIS — S065X9A Traumatic subdural hemorrhage with loss of consciousness of unspecified duration, initial encounter: Secondary | ICD-10-CM

## 2013-08-13 DIAGNOSIS — R259 Unspecified abnormal involuntary movements: Secondary | ICD-10-CM

## 2013-08-13 DIAGNOSIS — M7989 Other specified soft tissue disorders: Secondary | ICD-10-CM

## 2013-08-13 DIAGNOSIS — S7292XA Unspecified fracture of left femur, initial encounter for closed fracture: Secondary | ICD-10-CM

## 2013-08-13 DIAGNOSIS — M545 Low back pain, unspecified: Secondary | ICD-10-CM

## 2013-08-13 DIAGNOSIS — I6200 Nontraumatic subdural hemorrhage, unspecified: Secondary | ICD-10-CM

## 2013-08-13 DIAGNOSIS — S065XAA Traumatic subdural hemorrhage with loss of consciousness status unknown, initial encounter: Secondary | ICD-10-CM

## 2013-08-13 NOTE — Patient Instructions (Addendum)
I do not detect concerning findings for Parkinsonism or Parkinson's disease at this time. I think overall you are doing fairly well but I do want to suggest a few things today:  Remember to drink plenty of fluid, eat healthy meals and do not skip any meals. Try to eat protein with a every meal and eat a healthy snack such as fruit or nuts in between meals. Try to keep a regular sleep-wake schedule and try to exercise daily, particularly in the form of walking, 20-30 minutes a day, if you can.   As far as your medications are concerned, I would like to suggest no changes.    As far as diagnostic testing: no new test needed.   I can see you back as needed.
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
## 2013-10-22 ENCOUNTER — Other Ambulatory Visit: Payer: Self-pay

## 2013-10-22 MED ORDER — AMLODIPINE BESYLATE 10 MG PO TABS: 10.0000 mg | ORAL_TABLET | Freq: Every day | ORAL | Status: DC

## 2014-01-06 ENCOUNTER — Ambulatory Visit (INDEPENDENT_AMBULATORY_CARE_PROVIDER_SITE_OTHER): Payer: Medicare Other | Admitting: Cardiology

## 2014-01-06 ENCOUNTER — Encounter: Payer: Self-pay | Admitting: Cardiology

## 2014-01-06 ENCOUNTER — Encounter: Payer: Self-pay | Admitting: *Deleted

## 2014-01-06 VITALS — BP 122/66 | HR 86 | Ht 72.0 in | Wt 255.7 lb

## 2014-01-06 DIAGNOSIS — I2510 Atherosclerotic heart disease of native coronary artery without angina pectoris: Secondary | ICD-10-CM

## 2014-01-06 DIAGNOSIS — I714 Abdominal aortic aneurysm, without rupture, unspecified: Secondary | ICD-10-CM

## 2014-01-06 DIAGNOSIS — I209 Angina pectoris, unspecified: Secondary | ICD-10-CM

## 2014-01-06 DIAGNOSIS — I10 Essential (primary) hypertension: Secondary | ICD-10-CM

## 2014-01-06 DIAGNOSIS — I25119 Atherosclerotic heart disease of native coronary artery with unspecified angina pectoris: Secondary | ICD-10-CM

## 2014-01-06 NOTE — Assessment & Plan Note (Signed)
Patient has a history of coronary calcification. He is now on aspirin given subdural hematoma previously. He is unwilling to take statins.

## 2014-01-06 NOTE — Assessment & Plan Note (Signed)
Blood pressure is now controlled and he discontinued his amlodipine because of pedal edema. Continue to monitor and add medications as needed.

## 2014-01-06 NOTE — Assessment & Plan Note (Signed)
Followup abdominal ultrasound October 2015.

## 2014-01-06 NOTE — Patient Instructions (Addendum)
Your physician wants you to follow-up in: ONE YEAR WITH DR CRENSHAW You will receive a reminder letter in the mail two months in advance. If you don't receive a letter, please call our office to schedule the follow-up appointment.   Your physician has requested that you have an abdominal aorta duplex. During this test, an ultrasound is used to evaluate the aorta. Allow 30 minutes for this exam. Do not eat after midnight the day before and avoid carbonated beverages  

## 2014-01-06 NOTE — Progress Notes (Signed)
HPI: FU CAD; I initially saw in Oct 2013 for evaluation of dyspnea. Note he also has a history of unexplained pulmonary embolus approximately 8 years ago treated with one year of Coumadin. PFTs in September 2013 showed mild obstructive disease. Echocardiogram in October of 2013 showed normal LV function, grade 1 diastolic dysfunction, moderate left ventricular hypertrophy, mild left atrial enlargement and trace aortic insufficiency. Dobutamine echocardiogram in November of 2013 was normal. Chest CT showed no pulmonary embolus. There was coronary artery atherosclerosis and emphysema noted. Patient was admitted in October of 2013 and found to have a subdural hematoma which required evacuation. Abdominal ultrasound in October of 2014 showed a 3.3 x 3.0 cm abdominal aortic aneurysm. Followup recommended in one year. Since I last saw him, he Has some dyspnea on exertion but no chest pain or syncope. Some pedal edema.  Current Outpatient Prescriptions  Medication Sig Dispense Refill  . allopurinol (ZYLOPRIM) 300 MG tablet Take 300 mg by mouth daily.      Marland Kitchen ibuprofen (ADVIL,MOTRIN) 200 MG tablet Take 400 mg by mouth 4 (four) times daily.      Marland Kitchen omeprazole (PRILOSEC) 20 MG capsule Take 20 mg by mouth daily.       No current facility-administered medications for this visit.     Past Medical History  Diagnosis Date  . Gout   . GERD (gastroesophageal reflux disease)   . Barrett's esophagus 2009  . FH: colonic polyps   . Arthritis   . Hypertension   . Prostate cancer 12/2011    Found on routine screening PSA, planning for implants by Dr. Sondra Come 10/16   . Pulmonary embolus 2005    Bilateral, coumadin x 1 years, inpatient for 5 days  . Subdural hematoma   . Infected sebaceous cyst back 7x5x4cm s/p I&D [id number] 01/11/2013  . History of radiation therapy 06/22/2012-08/18/2012    78 Gy to prostate    Past Surgical History  Procedure Laterality Date  . Fracture left femur repair  09/2010    Went  to [facility] x 3.5 weeks  . Tonsillectomy      as child.  . Craniotomy  04/03/2012    Procedure: CRANIOTOMY HEMATOMA EVACUATION SUBDURAL;  Surgeon: Erline Levine, MD;  Location: [place] [facility];  Service: Neurosurgery;  Laterality: Left;  Left Craniotomy for Evacuation of Subdural Hematoma  . Colonoscopy    . Polypectomy    . Abcessed cyst removed from back  01-11-13    History   Social History  . Marital Status: Married    Spouse Name: N/A    Number of Children: 3  . Years of Education: N/A   Occupational History  .      Dance movement psychotherapist   Social History Main Topics  . Smoking status: Former Smoker -- 1.00 packs/day for 20 years    Types: Cigarettes    Quit date: 12/23/1971  . Smokeless tobacco: Never Used  . Alcohol Use: 3.6 oz/week    6 Cans of beer per week     Comment: 2 drinks per night, last drink was 03/29/2012, 6 pack of beer a week  . Drug Use: No  . Sexual Activity: Not on file   Other Topics Concern  . Not on file   Social History Narrative   Originally from [place].  Retired to [employer] because he was tired of all the snow.    ROS: no fevers or chills, productive cough, hemoptysis, dysphasia, odynophagia, melena, hematochezia, dysuria, hematuria, rash,  seizure activity, orthopnea, PND, pedal edema, claudication. Remaining systems are negative.  Physical Exam: Well-developed well-nourished in no acute distress.  Skin is warm and dry.  HEENT is normal.  Neck is supple.  Chest is clear to auscultation with normal expansion.  Cardiovascular exam is regular rate and rhythm.  Abdominal exam nontender or distended. No masses palpated. Extremities show Trace edema. neuro grossly intact  ECG Sinus right bundle branch block. Left anterior fascicular block. PVC.

## 2014-01-11 ENCOUNTER — Telehealth (HOSPITAL_COMMUNITY): Payer: Self-pay | Admitting: *Deleted

## 2014-04-11 ENCOUNTER — Encounter: Payer: Self-pay | Admitting: *Deleted

## 2014-04-11 ENCOUNTER — Encounter (HOSPITAL_COMMUNITY): Payer: Self-pay | Admitting: Pharmacy Technician

## 2014-04-11 ENCOUNTER — Other Ambulatory Visit: Payer: Self-pay | Admitting: Cardiology

## 2014-04-11 ENCOUNTER — Ambulatory Visit (INDEPENDENT_AMBULATORY_CARE_PROVIDER_SITE_OTHER): Payer: Medicare Other | Admitting: Cardiology

## 2014-04-11 ENCOUNTER — Encounter: Payer: Self-pay | Admitting: Cardiology

## 2014-04-11 VITALS — BP 112/70 | HR 80 | Ht 73.0 in | Wt 247.3 lb

## 2014-04-11 DIAGNOSIS — I714 Abdominal aortic aneurysm, without rupture, unspecified: Secondary | ICD-10-CM

## 2014-04-11 DIAGNOSIS — R0600 Dyspnea, unspecified: Secondary | ICD-10-CM

## 2014-04-11 DIAGNOSIS — I2510 Atherosclerotic heart disease of native coronary artery without angina pectoris: Secondary | ICD-10-CM

## 2014-04-11 DIAGNOSIS — I10 Essential (primary) hypertension: Secondary | ICD-10-CM

## 2014-04-11 DIAGNOSIS — I2583 Coronary atherosclerosis due to lipid rich plaque: Secondary | ICD-10-CM

## 2014-04-11 LAB — CBC
HEMATOCRIT: 45.3 % (ref 39.0–52.0)
HEMOGLOBIN: 15.5 g/dL (ref 13.0–17.0)
MCH: 33.2 pg (ref 26.0–34.0)
MCHC: 34.2 g/dL (ref 30.0–36.0)
MCV: 97 fL (ref 78.0–100.0)
Platelets: 160 10*3/uL (ref 150–400)
RBC: 4.67 MIL/uL (ref 4.22–5.81)
RDW: 13.2 % (ref 11.5–15.5)
WBC: 7.2 10*3/uL (ref 4.0–10.5)

## 2014-04-11 LAB — BASIC METABOLIC PANEL WITH GFR
BUN: 29 mg/dL — AB (ref 6–23)
CO2: 21 mEq/L (ref 19–32)
CREATININE: 1.17 mg/dL (ref 0.50–1.35)
Calcium: 8.9 mg/dL (ref 8.4–10.5)
Chloride: 105 mEq/L (ref 96–112)
GFR, EST AFRICAN AMERICAN: 71 mL/min
GFR, EST NON AFRICAN AMERICAN: 61 mL/min
Glucose, Bld: 121 mg/dL — ABNORMAL HIGH (ref 70–99)
POTASSIUM: 4.4 meq/L (ref 3.5–5.3)
Sodium: 143 mEq/L (ref 135–145)

## 2014-04-11 MED ORDER — ASPIRIN EC 81 MG PO TBEC: 81.0000 mg | DELAYED_RELEASE_TABLET | Freq: Every day | ORAL | Status: DC

## 2014-04-11 NOTE — Progress Notes (Signed)
HPI: FU CAD; I initially saw in Oct 2013 for evaluation of dyspnea. Note he also has a history of unexplained pulmonary embolus approximately 8 years ago treated with one year of Coumadin. PFTs in September 2013 showed mild obstructive disease. Echocardiogram in October of 2013 showed normal LV function, grade 1 diastolic dysfunction, moderate left ventricular hypertrophy, mild left atrial enlargement and trace aortic insufficiency. Dobutamine echocardiogram in November of 2013 was normal. Chest CT showed no pulmonary embolus. There was coronary artery atherosclerosis and emphysema noted. Patient was admitted in October of 2013 and found to have a subdural hematoma which required evacuation. Abdominal ultrasound in October of 2014 showed a 3.3 x 3.0 cm abdominal aortic aneurysm. Followup recommended in one year. Since I last saw him, His dyspnea has worsened. He describes dyspnea after walking across the room. There is no orthopnea, PND, chest pain or syncope. He has chronic mild pedal edema. He was seen recently by his primary care and Lasix was initiated. His symptoms have not improved. He did state his symptoms have markedly worsened in the past 4 weeks. No recent travel.   Current Outpatient Prescriptions  Medication Sig Dispense Refill  . allopurinol (ZYLOPRIM) 300 MG tablet Take 300 mg by mouth daily.      . furosemide (LASIX) 40 MG tablet Take 1 tablet by mouth daily.      Marland Kitchen omeprazole (PRILOSEC) 20 MG capsule Take 20 mg by mouth daily.      . potassium chloride (K-DUR) 10 MEQ tablet Take 1 tablet by mouth daily.       No current facility-administered medications for this visit.     Past Medical History  Diagnosis Date  . Gout   . GERD (gastroesophageal reflux disease)   . Barrett's esophagus 2009  . FH: colonic polyps   . Arthritis   . Hypertension   . Prostate cancer 12/2011    Found on routine screening PSA, planning for implants by Dr. Sondra Come 10/16   . Pulmonary embolus  2005    Bilateral, coumadin x 1 years, inpatient for 5 days  . Subdural hematoma   . Infected sebaceous cyst back 7x5x4cm s/p I&D [id number] 01/11/2013  . History of radiation therapy 06/22/2012-08/18/2012    78 Gy to prostate  . AAA (abdominal aortic aneurysm)   . CAD (coronary artery disease)     Noted to have coronary calcification on CT    Past Surgical History  Procedure Laterality Date  . Fracture left femur repair  09/2010    Went to [facility] x 3.5 weeks  . Tonsillectomy      as child.  . Craniotomy  04/03/2012    Procedure: CRANIOTOMY HEMATOMA EVACUATION SUBDURAL;  Surgeon: Erline Levine, MD;  Location: [place] [facility];  Service: Neurosurgery;  Laterality: Left;  Left Craniotomy for Evacuation of Subdural Hematoma  . Colonoscopy    . Polypectomy    . Abcessed cyst removed from back  01-11-13    History   Social History  . Marital Status: Married    Spouse Name: N/A    Number of Children: 3  . Years of Education: N/A   Occupational History  .      Dance movement psychotherapist   Social History Main Topics  . Smoking status: Former Smoker -- 1.00 packs/day for 20 years    Types: Cigarettes    Quit date: 12/23/1971  . Smokeless tobacco: Never Used  . Alcohol Use: 3.6 oz/week    6 Cans of  beer per week     Comment: 2 drinks per night, last drink was 03/29/2012, 6 pack of beer a week  . Drug Use: No  . Sexual Activity: Not on file   Other Topics Concern  . Not on file   Social History Narrative   Originally from [place].  Retired to [employer] because he was tired of all the snow.    ROS: no fevers or chills, productive cough, hemoptysis, dysphasia, odynophagia, melena, hematochezia, dysuria, hematuria, rash, seizure activity, orthopnea, PND, pedal edema, claudication. Remaining systems are negative.  Physical Exam: Well-developed well-nourished in no acute distress.  Skin is warm and dry.  HEENT is normal.  Neck is supple.  Chest is clear to auscultation with  normal expansion.  Cardiovascular exam is regular rate and rhythm.  Abdominal exam nontender or distended. No masses palpated. Extremities show trace edema. neuro grossly intact  Electrocardiogram 04/06/2014 shows sinus rhythm, left anterior fascicular block, right bundle branch block, inferior lateral T-wave inversion.

## 2014-04-11 NOTE — Patient Instructions (Addendum)
Your physician recommends that you schedule a follow-up appointment in: [place] has requested that you have a cardiac catheterization. Cardiac catheterization is used to diagnose and/or treat various heart conditions. Doctors may recommend this procedure for a number of different reasons. The most common reason is to evaluate chest pain. Chest pain can be a symptom of coronary artery disease (CAD), and cardiac catheterization can show whether plaque is narrowing or blocking your heart's arteries. This procedure is also used to evaluate the valves, as well as measure the blood flow and oxygen levels in different parts of your heart. For further information please visit [facility]. Please follow instruction sheet, as given.   Your physician recommends that you HAVE LAB WORK TODAY  START ASPIRIN 105 MG ONCE DAILY    Your physician has requested that you have an echocardiogram. Echocardiography is a painless test that uses sound waves to create images of your heart. It provides your doctor with information about the size and shape of your heart and how well your heart's chambers and valves are working. This procedure takes approximately one hour. There are no restrictions for this procedure.

## 2014-04-11 NOTE — Assessment & Plan Note (Signed)
Schedule followup abdominal ultrasound. 

## 2014-04-11 NOTE — Assessment & Plan Note (Signed)
Blood pressure controlled. Continue present medications. 

## 2014-04-11 NOTE — Assessment & Plan Note (Addendum)
He is having significant dyspnea on exertion of uncertain etiology. He has a remote history of pulmonary embolus. His symptoms have markedly worsened in the past 4-6 weeks. Check d-dimer and if elevated will need CT scan or VQ scan. Check BNP. Repeat echocardiogram. Note his oxygen saturation at rest today was 94%. With ambulation it was 90%. I will discontinue his Lasix and potassium as he does not appear to be volume overloaded. Given marked dyspnea and history of pulmonary embolus I feel definitive evaluation is warranted. He did have a negative stress test in October of 2013. Plan to proceed with right and left cardiac catheterization. The risks and benefits were discussed and he agrees to proceed. Add aspirin 81 mg daily.

## 2014-04-11 NOTE — Assessment & Plan Note (Signed)
Patient discontinued aspirin because of previous subdural. Declined statins previously.

## 2014-04-12 ENCOUNTER — Encounter (HOSPITAL_COMMUNITY)
Admission: RE | Disposition: A | Discharge status: Home / Self Care | Payer: Self-pay | Source: Ambulatory Visit | Attending: Cardiology

## 2014-04-12 ENCOUNTER — Observation Stay (HOSPITAL_COMMUNITY): Payer: Medicare Other

## 2014-04-12 ENCOUNTER — Inpatient Hospital Stay (HOSPITAL_COMMUNITY)
Admission: RE | Admit: 2014-04-12 | Discharge: 2014-04-26 | DRG: 176 | Disposition: A | Discharge status: Home / Self Care | Payer: Medicare Other | Source: Ambulatory Visit | Attending: Cardiology | Admitting: Cardiology

## 2014-04-12 ENCOUNTER — Telehealth: Payer: Self-pay | Admitting: Cardiology

## 2014-04-12 ENCOUNTER — Encounter (HOSPITAL_COMMUNITY): Payer: Self-pay | Admitting: General Practice

## 2014-04-12 DIAGNOSIS — Z923 Personal history of irradiation: Secondary | ICD-10-CM

## 2014-04-12 DIAGNOSIS — Z79899 Other long term (current) drug therapy: Secondary | ICD-10-CM

## 2014-04-12 DIAGNOSIS — I82402 Acute embolism and thrombosis of unspecified deep veins of left lower extremity: Secondary | ICD-10-CM

## 2014-04-12 DIAGNOSIS — K219 Gastro-esophageal reflux disease without esophagitis: Secondary | ICD-10-CM | POA: Diagnosis present

## 2014-04-12 DIAGNOSIS — I270 Primary pulmonary hypertension: Secondary | ICD-10-CM

## 2014-04-12 DIAGNOSIS — I2699 Other pulmonary embolism without acute cor pulmonale: Principal | ICD-10-CM

## 2014-04-12 DIAGNOSIS — I82412 Acute embolism and thrombosis of left femoral vein: Secondary | ICD-10-CM | POA: Diagnosis present

## 2014-04-12 DIAGNOSIS — I82492 Acute embolism and thrombosis of other specified deep vein of left lower extremity: Secondary | ICD-10-CM | POA: Diagnosis present

## 2014-04-12 DIAGNOSIS — I82432 Acute embolism and thrombosis of left popliteal vein: Secondary | ICD-10-CM | POA: Diagnosis present

## 2014-04-12 DIAGNOSIS — I2510 Atherosclerotic heart disease of native coronary artery without angina pectoris: Secondary | ICD-10-CM

## 2014-04-12 DIAGNOSIS — I2584 Coronary atherosclerosis due to calcified coronary lesion: Secondary | ICD-10-CM

## 2014-04-12 DIAGNOSIS — Z8546 Personal history of malignant neoplasm of prostate: Secondary | ICD-10-CM

## 2014-04-12 DIAGNOSIS — I824Z2 Acute embolism and thrombosis of unspecified deep veins of left distal lower extremity: Secondary | ICD-10-CM

## 2014-04-12 DIAGNOSIS — I10 Essential (primary) hypertension: Secondary | ICD-10-CM

## 2014-04-12 DIAGNOSIS — I272 Other secondary pulmonary hypertension: Secondary | ICD-10-CM | POA: Diagnosis present

## 2014-04-12 DIAGNOSIS — R0600 Dyspnea, unspecified: Secondary | ICD-10-CM

## 2014-04-12 DIAGNOSIS — I824Z9 Acute embolism and thrombosis of unspecified deep veins of unspecified distal lower extremity: Secondary | ICD-10-CM

## 2014-04-12 DIAGNOSIS — I82409 Acute embolism and thrombosis of unspecified deep veins of unspecified lower extremity: Secondary | ICD-10-CM

## 2014-04-12 DIAGNOSIS — Z86718 Personal history of other venous thrombosis and embolism: Secondary | ICD-10-CM

## 2014-04-12 DIAGNOSIS — I714 Abdominal aortic aneurysm, without rupture: Secondary | ICD-10-CM | POA: Diagnosis present

## 2014-04-12 DIAGNOSIS — Z7982 Long term (current) use of aspirin: Secondary | ICD-10-CM

## 2014-04-12 DIAGNOSIS — Z87891 Personal history of nicotine dependence: Secondary | ICD-10-CM

## 2014-04-12 DIAGNOSIS — R609 Edema, unspecified: Secondary | ICD-10-CM

## 2014-04-12 DIAGNOSIS — Z7901 Long term (current) use of anticoagulants: Secondary | ICD-10-CM

## 2014-04-12 HISTORY — DX: Pulmonary hypertension, unspecified: I27.20

## 2014-04-12 HISTORY — PX: LEFT AND RIGHT HEART CATHETERIZATION WITH CORONARY ANGIOGRAM: SHX5449

## 2014-04-12 LAB — PROTIME-INR
INR: 1.11 (ref <1.50)
INR: 1.11 (ref 0.00–1.49)
PROTHROMBIN TIME: 14.3 s (ref 11.6–15.2)
PROTHROMBIN TIME: 14.4 s (ref 11.6–15.2)

## 2014-04-12 LAB — CBC
HCT: 46.8 % (ref 39.0–52.0)
HEMOGLOBIN: 15.9 g/dL (ref 13.0–17.0)
MCH: 33.3 pg (ref 26.0–34.0)
MCHC: 34 g/dL (ref 30.0–36.0)
MCV: 97.9 fL (ref 78.0–100.0)
Platelets: 152 10*3/uL (ref 150–400)
RBC: 4.78 MIL/uL (ref 4.22–5.81)
RDW: 13.3 % (ref 11.5–15.5)
WBC: 5.9 10*3/uL (ref 4.0–10.5)

## 2014-04-12 LAB — BASIC METABOLIC PANEL
Anion gap: 19 — ABNORMAL HIGH (ref 5–15)
BUN: 26 mg/dL — ABNORMAL HIGH (ref 6–23)
CHLORIDE: 99 meq/L (ref 96–112)
CO2: 23 mEq/L (ref 19–32)
Calcium: 9.1 mg/dL (ref 8.4–10.5)
Creatinine, Ser: 1.05 mg/dL (ref 0.50–1.35)
GFR calc non Af Amer: 68 mL/min — ABNORMAL LOW (ref >90)
GFR, EST AFRICAN AMERICAN: 79 mL/min — AB (ref >90)
Glucose, Bld: 124 mg/dL — ABNORMAL HIGH (ref 70–99)
POTASSIUM: 3.9 meq/L (ref 3.7–5.3)
SODIUM: 141 meq/L (ref 137–147)

## 2014-04-12 LAB — POCT I-STAT 3, VENOUS BLOOD GAS (G3P V)
Acid-base deficit: 2 mmol/L (ref 0.0–2.0)
Bicarbonate: 22.2 meq/L (ref 20.0–24.0)
O2 Saturation: 63 %
TCO2: 23 mmol/L (ref 0–100)
pCO2, Ven: 35.6 mmHg — ABNORMAL LOW (ref 45.0–50.0)
pH, Ven: 7.402 — ABNORMAL HIGH (ref 7.250–7.300)
pO2, Ven: 32 mmHg (ref 30.0–45.0)

## 2014-04-12 LAB — D-DIMER, QUANTITATIVE (NOT AT ARMC): D DIMER QUANT: 12.57 ug{FEU}/mL — AB (ref 0.00–0.48)

## 2014-04-12 LAB — POCT I-STAT 3, ART BLOOD GAS (G3+)
Acid-base deficit: 2 mmol/L (ref 0.0–2.0)
Bicarbonate: 20.7 meq/L (ref 20.0–24.0)
O2 Saturation: 94 %
TCO2: 22 mmol/L (ref 0–100)
pCO2 arterial: 29.8 mmHg — ABNORMAL LOW (ref 35.0–45.0)
pH, Arterial: 7.45 (ref 7.350–7.450)
pO2, Arterial: 64 mmHg — ABNORMAL LOW (ref 80.0–100.0)

## 2014-04-12 LAB — BRAIN NATRIURETIC PEPTIDE: Brain Natriuretic Peptide: 555.7 pg/mL — ABNORMAL HIGH (ref 0.0–100.0)

## 2014-04-12 LAB — POCT ACTIVATED CLOTTING TIME: ACTIVATED CLOTTING TIME: 152 s

## 2014-04-12 LAB — HEPARIN LEVEL (UNFRACTIONATED): HEPARIN UNFRACTIONATED: 0.54 [IU]/mL (ref 0.30–0.70)

## 2014-04-12 SURGERY — LEFT AND RIGHT HEART CATHETERIZATION WITH CORONARY ANGIOGRAM
Anesthesia: LOCAL

## 2014-04-12 MED ORDER — ASPIRIN EC 81 MG PO TBEC
81.0000 mg | DELAYED_RELEASE_TABLET | Freq: Every day | ORAL | Status: DC
  Administered 2014-04-13: 81 mg via ORAL
  Filled 2014-04-12 (×2): qty 1

## 2014-04-12 MED ORDER — HEPARIN SODIUM (PORCINE) 1000 UNIT/ML IJ SOLN
INTRAMUSCULAR | Status: AC
  Filled 2014-04-12: qty 1

## 2014-04-12 MED ORDER — SODIUM CHLORIDE 0.9 % IJ SOLN: 3.0000 mL | Freq: Two times a day (BID) | INTRAMUSCULAR | Status: DC

## 2014-04-12 MED ORDER — MIDAZOLAM HCL 2 MG/2ML IJ SOLN
INTRAMUSCULAR | Status: AC
  Filled 2014-04-12: qty 2

## 2014-04-12 MED ORDER — ALLOPURINOL 300 MG PO TABS
300.0000 mg | ORAL_TABLET | Freq: Every day | ORAL | Status: DC
  Administered 2014-04-13 – 2014-04-26 (×14): 300 mg via ORAL
  Filled 2014-04-12 (×14): qty 1

## 2014-04-12 MED ORDER — VERAPAMIL HCL 2.5 MG/ML IV SOLN
INTRAVENOUS | Status: AC
  Filled 2014-04-12: qty 2

## 2014-04-12 MED ORDER — FENTANYL CITRATE 0.05 MG/ML IJ SOLN: 12.5000 ug | Freq: Once | INTRAMUSCULAR | Status: DC

## 2014-04-12 MED ORDER — LIDOCAINE HCL (PF) 1 % IJ SOLN
INTRAMUSCULAR | Status: AC
  Filled 2014-04-12: qty 30

## 2014-04-12 MED ORDER — HEPARIN (PORCINE) IN NACL 2-0.9 UNIT/ML-% IJ SOLN
INTRAMUSCULAR | Status: AC
  Filled 2014-04-12: qty 1000

## 2014-04-12 MED ORDER — SODIUM CHLORIDE 0.9 % IJ SOLN: 3.0000 mL | INTRAMUSCULAR | Status: DC | PRN

## 2014-04-12 MED ORDER — PANTOPRAZOLE SODIUM 40 MG PO TBEC
40.0000 mg | DELAYED_RELEASE_TABLET | Freq: Every day | ORAL | Status: DC
  Administered 2014-04-13 – 2014-04-26 (×14): 40 mg via ORAL
  Filled 2014-04-12 (×13): qty 1

## 2014-04-12 MED ORDER — ASPIRIN 81 MG PO CHEW: 81.0000 mg | CHEWABLE_TABLET | ORAL | Status: DC

## 2014-04-12 MED ORDER — SODIUM CHLORIDE 0.9 % IV SOLN: 1.0000 mL/kg/h | INTRAVENOUS | Status: AC

## 2014-04-12 MED ORDER — IOHEXOL 350 MG/ML SOLN
70.0000 mL | Freq: Once | INTRAVENOUS | Status: AC | PRN
Start: 2014-04-12 — End: 2014-04-12
  Administered 2014-04-12: 14:00:00 70 mL via INTRAVENOUS

## 2014-04-12 MED ORDER — SODIUM CHLORIDE 0.9 % IV SOLN
1.0000 mL/kg/h | INTRAVENOUS | Status: DC
  Administered 2014-04-12: 1 mL/kg/h via INTRAVENOUS

## 2014-04-12 MED ORDER — FENTANYL CITRATE 0.05 MG/ML IJ SOLN
INTRAMUSCULAR | Status: AC
  Filled 2014-04-12: qty 2

## 2014-04-12 MED ORDER — NITROGLYCERIN 1 MG/10 ML FOR IR/CATH LAB
INTRA_ARTERIAL | Status: AC
  Filled 2014-04-12: qty 10

## 2014-04-12 MED ORDER — SODIUM CHLORIDE 0.9 % IV SOLN: 250.0000 mL | INTRAVENOUS | Status: DC | PRN

## 2014-04-12 MED ORDER — HEPARIN (PORCINE) IN NACL 100-0.45 UNIT/ML-% IJ SOLN
1500.0000 [IU]/h | INTRAMUSCULAR | Status: DC
  Administered 2014-04-12: 16:00:00 1700 [IU]/h via INTRAVENOUS
  Administered 2014-04-13: 07:00:00 1600 [IU]/h via INTRAVENOUS
  Administered 2014-04-13 – 2014-04-14 (×3): 1500 [IU]/h via INTRAVENOUS
  Administered 2014-04-15 – 2014-04-19 (×7): 1600 [IU]/h via INTRAVENOUS
  Administered 2014-04-20 – 2014-04-21 (×2): 1700 [IU]/h via INTRAVENOUS
  Administered 2014-04-22 (×2): 1600 [IU]/h via INTRAVENOUS
  Administered 2014-04-22: 1700 [IU]/h via INTRAVENOUS
  Administered 2014-04-23: 1600 [IU]/h via INTRAVENOUS
  Administered 2014-04-24: 1500 [IU]/h via INTRAVENOUS
  Administered 2014-04-24: 1600 [IU]/h via INTRAVENOUS
  Administered 2014-04-25: 1500 [IU]/h via INTRAVENOUS
  Filled 2014-04-12 (×30): qty 250

## 2014-04-12 NOTE — H&P (View-Only) (Signed)
HPI: FU CAD; I initially saw in Oct 2013 for evaluation of dyspnea. Note he also has a history of unexplained pulmonary embolus approximately 8 years ago treated with one year of Coumadin. PFTs in September 2013 showed mild obstructive disease. Echocardiogram in October of 2013 showed normal LV function, grade 1 diastolic dysfunction, moderate left ventricular hypertrophy, mild left atrial enlargement and trace aortic insufficiency. Dobutamine echocardiogram in November of 2013 was normal. Chest CT showed no pulmonary embolus. There was coronary artery atherosclerosis and emphysema noted. Patient was admitted in October of 2013 and found to have a subdural hematoma which required evacuation. Abdominal ultrasound in October of 2014 showed a 3.3 x 3.0 cm abdominal aortic aneurysm. Followup recommended in one year. Since I last saw him, His dyspnea has worsened. He describes dyspnea after walking across the room. There is no orthopnea, PND, chest pain or syncope. He has chronic mild pedal edema. He was seen recently by his primary care and Lasix was initiated. His symptoms have not improved. He did state his symptoms have markedly worsened in the past 4 weeks. No recent travel.   Current Outpatient Prescriptions  Medication Sig Dispense Refill  . allopurinol (ZYLOPRIM) 300 MG tablet Take 300 mg by mouth daily.      . furosemide (LASIX) 40 MG tablet Take 1 tablet by mouth daily.      Marland Kitchen omeprazole (PRILOSEC) 20 MG capsule Take 20 mg by mouth daily.      . potassium chloride (K-DUR) 10 MEQ tablet Take 1 tablet by mouth daily.       No current facility-administered medications for this visit.     Past Medical History  Diagnosis Date  . Gout   . GERD (gastroesophageal reflux disease)   . Barrett's esophagus 2009  . FH: colonic polyps   . Arthritis   . Hypertension   . Prostate cancer 12/2011    Found on routine screening PSA, planning for implants by Dr. Sondra Come 10/16   . Pulmonary embolus  2005    Bilateral, coumadin x 1 years, inpatient for 5 days  . Subdural hematoma   . Infected sebaceous cyst back 7x5x4cm s/p I&D [id number] 01/11/2013  . History of radiation therapy 06/22/2012-08/18/2012    78 Gy to prostate  . AAA (abdominal aortic aneurysm)   . CAD (coronary artery disease)     Noted to have coronary calcification on CT    Past Surgical History  Procedure Laterality Date  . Fracture left femur repair  09/2010    Went to [facility] x 3.5 weeks  . Tonsillectomy      as child.  . Craniotomy  04/03/2012    Procedure: CRANIOTOMY HEMATOMA EVACUATION SUBDURAL;  Surgeon: Erline Levine, MD;  Location: [facility];  Service: Neurosurgery;  Laterality: Left;  Left Craniotomy for Evacuation of Subdural Hematoma  . Colonoscopy    . Polypectomy    . Abcessed cyst removed from back  01-11-13    History   Social History  . Marital Status: Married    Spouse Name: N/A    Number of Children: 3  . Years of Education: N/A   Occupational History  .      Dance movement psychotherapist   Social History Main Topics  . Smoking status: Former Smoker -- 1.00 packs/day for 20 years    Types: Cigarettes    Quit date: 12/23/1971  . Smokeless tobacco: Never Used  . Alcohol Use: 3.6 oz/week    6 Cans of  beer per week     Comment: 2 drinks per night, last drink was 03/29/2012, 6 pack of beer a week  . Drug Use: No  . Sexual Activity: Not on file   Other Topics Concern  . Not on file   Social History Narrative   Originally from [place].  Retired to [employer] because he was tired of all the snow.    ROS: no fevers or chills, productive cough, hemoptysis, dysphasia, odynophagia, melena, hematochezia, dysuria, hematuria, rash, seizure activity, orthopnea, PND, pedal edema, claudication. Remaining systems are negative.  Physical Exam: Well-developed well-nourished in no acute distress.  Skin is warm and dry.  HEENT is normal.  Neck is supple.  Chest is clear to auscultation with  normal expansion.  Cardiovascular exam is regular rate and rhythm.  Abdominal exam nontender or distended. No masses palpated. Extremities show trace edema. neuro grossly intact  Electrocardiogram 04/06/2014 shows sinus rhythm, left anterior fascicular block, right bundle branch block, inferior lateral T-wave inversion.

## 2014-04-12 NOTE — Telephone Encounter (Signed)
[name] is calling with an alert .Marland KitchenMarland KitchenMarland KitchenMarland Kitchen

## 2014-04-12 NOTE — Progress Notes (Signed)
*  PRELIMINARY RESULTS* Vascular Ultrasound Lower extremity venous duplex has been completed.  Preliminary findings: No DVT noted in Right lower extremity. DVT noted in mid to distal Left femoral vein, Left popliteal vein, and Left peroneal veins.   Landry Mellow, RDMS, RVT  04/12/2014, 3:36 PM

## 2014-04-12 NOTE — Telephone Encounter (Signed)
[facility] lab called , pt.s D Dimer was 12.57

## 2014-04-12 NOTE — Interval H&P Note (Signed)
History and Physical Interval Note:  04/12/2014 7:50 AM  [name]  has presented today for surgery, with the diagnosis of cp  The various methods of treatment have been discussed with the patient and family. After consideration of risks, benefits and other options for treatment, the patient has consented to  Procedure(s): LEFT AND RIGHT HEART CATHETERIZATION WITH CORONARY ANGIOGRAM (N/A) as a surgical intervention .  The patient's history has been reviewed, patient examined, no change in status, stable for surgery.  I have reviewed the patient's chart and labs.  Questions were answered to the patient's satisfaction.   Cath Lab Visit (complete for each Cath Lab visit)  Clinical Evaluation Leading to the Procedure:   ACS: No.  Non-ACS:    Anginal Classification: CCS III  Anti-ischemic medical therapy: No Therapy  Non-Invasive Test Results: No non-invasive testing performed  Prior CABG: No previous CABG        [name] 04/12/2014 7:50 AM

## 2014-04-12 NOTE — Telephone Encounter (Signed)
I talked with Hilda Blades about this and Dr. Stanford Breed is aware

## 2014-04-12 NOTE — Progress Notes (Signed)
TR BAND REMOVAL  LOCATION:    right radial  DEFLATED PER PROTOCOL:    Yes.    TIME BAND OFF / DRESSING APPLIED:    1245   SITE UPON ARRIVAL:    Level 0  SITE AFTER BAND REMOVAL:    Level 0  REVERSE ALLEN'S TEST:     positive  CIRCULATION SENSATION AND MOVEMENT:    Within Normal Limits   Yes.    COMMENTS:   Tolerated procedure well 

## 2014-04-12 NOTE — CV Procedure (Signed)
    Cardiac Catheterization Procedure Note  Name: [name] MRN: [id number] DOB: [birth year]  Procedure: Right Heart Cath, Left Heart Cath, Selective Coronary Angiography, LV angiography  Indication: 73 yo WM with remote history of pulmonary embolus and coronary calcium by CT presents with acute progressive dyspnea.   Procedural Details: The right wrist was prepped, draped, and anesthetized with 1% lidocaine. Using the modified Seldinger technique a 6 Fr slender sheath was placed in the right radial artery. Attempted to exchange the brachial VI for a 5 Fr sheath but unable to thread a wire through the IV. Able to engage the RCA from a radial approach but unable to engage the LCA due to severe radial spasm and elongation of the aorta making it very difficult to engage. At this point we switched to a femoral approach. The right groin was prepped and draped in a sterile fashion. It was anesthetized with 1% Lidocaine. Using a modified Seldinger technique a 5 Fr sheath was placed in the right femoral artery. A 7 Fr. Sheath was placed in the right femoral vein. A Swan-Ganz catheter was used for the right heart catheterization. Standard protocol was followed for recording of right heart pressures and sampling of oxygen saturations. Fick cardiac output was calculated. I was unable to obtain a PCWP due to marked enlargement of the right heart despite using a Swan wire. Standard Judkins catheters were used for selective coronary angiography and left ventriculography. A JL5 catheter was used for the LCA. There were no immediate procedural complications. The patient was transferred to the post catheterization recovery area for further monitoring.  Procedural Findings: Hemodynamics RA 13/10 mean 10 mm Hg RV 59/8/15 mm Hg PA 59/27 mean 39 mm Hg PCWP N/A LV 122/13 mm Hg AO 1125/83 mean 102 mm Hg  Oxygen saturations: PA 63% AO 94%  Cardiac Output (Fick) 4.64 L/min  Cardiac Index (Fick) 1.98 L/min meter  squared   Coronary angiography: Coronary dominance: right  Left mainstem: Normal  Left anterior descending (LAD): The LAD has diffuse 30% disease in the mid vessel. There is a large first diagonal that is normal. The second diagonal has an 80% ostial stenosis.  Left circumflex (LCx): Diffuse irregularities less than 20%.  Right coronary artery (RCA): Large dominant artery with 30-40% stenosis in the mid vessel.   Left ventriculography: Left ventricular systolic function is normal, LVEF is estimated at 55-65%, there is no significant mitral regurgitation   Final Conclusions:   1. Single vessel obstructive CAD involving ostium of the second diagonal. 2. Normal LV function. 3. Severe pulmonary HTN. Normal LVEDP.  Recommendations: Will evaluate for possible recurrent pulmonary embolus. D-dimer is 12.5. Also obtain Echo. Pulmonary to evaluate.   Peter Martinique, [place] 04/12/2014, 10:27 AM

## 2014-04-12 NOTE — Consult Note (Addendum)
Name: [name] MRN: [id number] DOB: 1941/02/22    ADMISSION DATE:  04/12/2014 CONSULTATION DATE:  10/20  REFERRING MD :  Stanford Breed   CHIEF COMPLAINT:  Pulmonary Artery HTN   BRIEF PATIENT DESCRIPTION:  73 year old male w/ known h/o CAD, prior PE back in 2012 treated w/ 12 months coumadin, prostate CA s/p XRT 2013. Admitted to [facility] for left and right heart cath on 10/20 to evaluate dyspnea. Found to have nml LV fxn, single vessel CAD and severe PAH. PCCM asked to evaluate for PAH.   SIGNIFICANT EVENTS  Admitted for evaluation of dyspnea and CP 10/20 CT angio chest 10/20:>>> LE [place] 10/20>>>  STUDIES:  Right and left heart cath 10/20:  > RA 13/10 mean 10 mm Hg; RV 59/8/15 mm Hg  PA 59/27 mean 39 mm Hg; PCWP N/A ; LV 122/13 mm Hg ; AO 1125/83 mean 102 mm Hg  Oxygen saturations: PA 63%; AO 94%; Cardiac Output (Fick) 4.64 L/min; Cardiac Index (Fick) 1.98 L/min meter squared.  > single vessel obstructive CAD involving ostium and second diagonal, NML LV fxn   ECHO 10/20>>>   HISTORY OF PRESENT ILLNESS:    73 year old male followed by Cardiology in the out-pt setting w/ known history of Pulmonary Emboli (2012), prostate CA s/p XRT  2013,  possible COPD (no PFTs on record), grade I diastolic dysfunction with moderate left ventricular hypertrophy, mild left atrial enlargement and trace aortic insufficiency and SDH requiring evacuation in 2013. He has known h/o AAA which has been followed by CT scan. Presented to cardiology w/ CC: worsening exertional dyspnea not improved w/ escalation of his diuretics. He was admitted to the cardiology service on 102/0 for evaluation of this dyspnea as well as exertional CP. He underwent right and left heart cath on day of admission. This showed single vessel obstructive CAD involving ostium and second diagonal, NML LV fxn and severe PAH (PA 59/27 mean 39 mmHg); RA 10 mmHg, CO 4.64l/min; CI 1.98 L/min; w/ nml LVEDP. PCCM has been asked to eval re: [place]     PAST MEDICAL HISTORY :   has a past medical history of Gout; GERD (gastroesophageal reflux disease); Barrett's esophagus (2009); FH: colonic polyps; Arthritis; Hypertension; Prostate cancer (12/2011); Pulmonary embolus (2005); Subdural hematoma; Infected sebaceous cyst back 7x5x4cm s/p I&D [id number] (01/11/2013); History of radiation therapy (06/22/2012-08/18/2012); AAA (abdominal aortic aneurysm); and CAD (coronary artery disease).  has past surgical history that includes fracture left femur repair (09/2010); Tonsillectomy; Craniotomy (04/03/2012); Colonoscopy; Polypectomy; and abcessed cyst removed from back (01-11-13). Prior to Admission medications   Medication Sig Start Date End Date Taking? Authorizing Provider  allopurinol (ZYLOPRIM) 300 MG tablet Take 300 mg by mouth daily.   Yes Historical Provider, MD  aspirin EC 81 MG tablet Take 1 tablet (81 mg total) by mouth daily. 04/11/14  Yes Lelon Perla, MD  omeprazole (PRILOSEC) 20 MG capsule Take 20 mg by mouth daily.   Yes Historical Provider, MD   No Known Allergies  FAMILY HISTORY:  family history includes Aneurysm in his father; Heart disease in an other family member; Macular degeneration in his mother; Osteoarthritis in his mother. There is no history of Colon cancer. SOCIAL HISTORY:  reports that he quit smoking about 42 years ago. His smoking use included Cigarettes. He has a 20 pack-year smoking history. He has never used smokeless tobacco. He reports that he drinks about 3.6 ounces of alcohol per week. He reports that he does not use illicit  drugs.  REVIEW OF SYSTEMS bolds are positive:   Constitutional: Negative for fever, chills, weight loss, malaise/fatigue and diaphoresis.  HENT: Negative for hearing loss, ear pain, nosebleeds, congestion, sore throat, neck pain, tinnitus and ear discharge.   Eyes: Negative for blurred vision, double vision, photophobia, pain, discharge and redness.  Respiratory: Negative for cough,  hemoptysis, sputum production, shortness of breath, wheezing and stridor.   Cardiovascular: Negative for chest pain, palpitations, orthopnea, claudication, leg swelling and PND.  Gastrointestinal: Negative for heartburn, nausea, vomiting, abdominal pain, diarrhea, constipation, blood in stool and melena.  Genitourinary: Negative for dysuria, urgency, frequency, hematuria and flank pain.  Musculoskeletal: Negative for myalgias, back pain, joint pain and falls.  Skin: Negative for itching and rash.  Neurological: Negative for dizziness, tingling, tremors, sensory change, speech change, focal weakness, seizures, loss of consciousness, weakness and headaches.  Endo/Heme/Allergies: Negative for environmental allergies and polydipsia. Does not bruise/bleed easily.  SUBJECTIVE:  No distress  VITAL SIGNS: Temp:  [97.6 F (36.4 C)] 97.6 F (36.4 C) (10/20 0555) Pulse Rate:  [103-105] 105 (10/20 0805) Resp:  [16] 16 (10/20 0555) BP: (140)/(90) 140/90 mmHg (10/20 0555) SpO2:  [97 %] 97 % (10/20 0555) Weight:  [111.131 kg (245 lb)] 111.131 kg (245 lb) (10/20 0555)  PHYSICAL EXAMINATION: General:  73 year old male in no acute distress.  Neuro:  Awake, oriented, no focal def  HEENT:  [place], no JVD  Cardiovascular:  Rrr, brisk CR  Lungs:  Clear no accessory muscle use  Abdomen:  Soft, non-tender + bowel sounds  Musculoskeletal:  Intact  Skin:  + lower extremity swelling    Recent Labs Lab 04/12/14 0622  NA 141  K 3.9  CL 99  CO2 23  BUN 26*  CREATININE 1.05  GLUCOSE 124*    Recent Labs Lab 04/12/14 0622  HGB 15.9  HCT 46.8  WBC 5.9  PLT 152   No results found.  ASSESSMENT / PLAN:  PAH Single vessel CAD H/o prostate CA H/o unprovoked Pulmonary Emboli (2005) SDH (2013, not on coumadin)  Discussion Severe PAH of uncertain etiology at this point. Given strongly positive d -dimer & h/o pulmonary embolism and malignancy, worried about potential for thrombo-embolic disease.   Note neg CT angio in 03/2012  Doubt significant airway obstruction, he quit smoking 40 yrs ago & smoked about 10 Pyrs  Plan Will obtain w/ CT chest and LE dopplers If needs anticoagulation will need to go into this mindful of his h/o sub dural hematoma.    Kara Mead MD. [place] Flood. [place] [facility] Pager [phone] If no response call [phone]    04/12/2014, 10:48 AM

## 2014-04-12 NOTE — Care Management Note (Addendum)
    Page 1 of 1   04/26/2014     4:04:15 PM CARE MANAGEMENT NOTE 04/26/2014  Patient:  [name], [name]   Account Number:  [id number]  Date Initiated:  04/12/2014  Documentation initiated by:  Mariann Laster  Subjective/Objective Assessment:   CP, Pulmonary HTN     Action/Plan:   CM to follow for disposition needs   Anticipated DC Date:  04/26/2014   Anticipated DC Plan:  [place]  CM consult      Choice offered to / List presented to:             Status of service:  Completed, signed off Medicare Important Message given?  YES (If response is "NO", the following Medicare IM given date fields will be blank) Date Medicare IM given:  04/18/2014 Medicare IM given by:  Elissa Hefty Date Additional Medicare IM given:  04/25/2014 Additional Medicare IM given by:  Jazzmine Kleiman  Discharge Disposition:  HOME/SELF CARE  Per UR Regulation:  Reviewed for med. necessity/level of care/duration of stay  If discussed at [place] of Stay Meetings, dates discussed:   04/19/2014  04/26/2014    Comments:  04/25/14 Ellan Lambert, RN, BSN [phone] INR therapeutic today, but needs one more day of overlap. Anticipate dc on 11/3.  04/19/14 Ellan Lambert, RN, BSN [phone] Pt adm on 04/12/14 with bilat PE, Lt DVT; on heparin drip. Will cont to follow for dc needs.  PTA, pt independent, lives with wife.  Crystal Hutchinson RN, BSN, MSHL, CCM  Nurse - Case Manager,  (Unit [phone]  04/12/2014 LEFT AND RIGHT HEART CATHETERIZATION WITH CORONARY ANGIOGRAM on 04/12/14 Med Review:  No needs identified at this time. CM will continue to follow

## 2014-04-12 NOTE — Progress Notes (Addendum)
ANTICOAGULATION CONSULT NOTE - Initial Consult  Pharmacy Consult for Heparin Indication: pulmonary embolus  No Known Allergies  Patient Measurements: Height: 6\' 1"  (185.4 cm) Weight: 245 lb (111.131 kg) IBW/kg (Calculated) : 79.9 Heparin Dosing Weight: 103.26  Vital Signs: Temp: 97.4 F (36.3 C) (10/20 1055) Temp Source: Oral (10/20 1055) BP: 110/64 mmHg (10/20 1130) Pulse Rate: 92 (10/20 1130)  Labs:  Recent Labs  04/11/14 0935 04/12/14 0622  HGB 15.5 15.9  HCT 45.3 46.8  PLT 160 152  LABPROT 14.3 14.4  INR 1.11 1.11  CREATININE 1.17 1.05    Estimated Creatinine Clearance: 80.7 ml/min (by C-G formula based on Cr of 1.05).   Medical History: Past Medical History  Diagnosis Date  . Gout   . GERD (gastroesophageal reflux disease)   . Barrett's esophagus 2009  . FH: colonic polyps   . Arthritis   . Hypertension   . Prostate cancer 12/2011    Found on routine screening PSA, planning for implants by Dr. Sondra Come 10/16   . Pulmonary embolus 2005    Bilateral, coumadin x 1 years, inpatient for 5 days  . Subdural hematoma   . Infected sebaceous cyst back 7x5x4cm s/p I&D [id number] 01/11/2013  . History of radiation therapy 06/22/2012-08/18/2012    78 Gy to prostate  . AAA (abdominal aortic aneurysm)   . CAD (coronary artery disease)     Noted to have coronary calcification on CT  . Shortness of breath   . Pulmonary HTN 04/12/2014    Medications:  Scheduled:  . fentaNYL  12.5-25 mcg Intravenous Once   Infusions:  . sodium chloride 1 mL/kg/hr (04/12/14 1050)    Assessment: 73 year old male with known history of CAD, prior PE in 2005 status post 12 months of warfarin therapy and history of prostate cancer status post radiation in 2013 now admitted with dyspnea for L/R heart cath. Cath revealed severe PAH and CT Angio of chest was conducted which revealed bilateral lobar submassive pulmonary emboli with right heart strain (RV/LV ratio 1.3).   *Reviewed Dr.  Jacalyn Lefevre H&P which notes that patient does have a history of a sub-dural hematoma in October of 2013 which required evacuation. Per the notes, it does not appear the patient was on chronic anticoagulation at that time.   *Spoke with Dr. Melvyn Novas regarding this history and heparin therapy - He gave verbal orders for no bolus and to target low therapeutic range.   CBC from today is stable. Platelets 160 >> 152. No bleeding issues reported.  PT/INR 14.4 / 1.11 (normal) SCr is stable at 1.05 with estimated CrCl ~ 70-80 mL/min.  Lower extremity dopplers from 04/12/14 were positive for DVT in Left LE.   *In Cath- Patient was given Heparin bolus of 5000 units at 08:34AM.  Sheath was removed from R-radial 09:26AM and TR band placed. TR band was removed at 12:45PM per RN report. No bleeding from this site per RN.  Sheath was removed from R-femoral 09:44 AM; no hematoma or bleeding noted.   Goal of Therapy:  Heparin level 0.3 to 0.5 units/ml *Target low end of goal range per Dr. Melvyn Novas.  Monitor platelets by anticoagulation protocol: Yes   Plan:  1. Start Heparin drip at 1700 units/hr 6 hours post sheath removal (no bolus due to recent procedure and history of sub-dural hematoma per Dr. Melvyn Novas).  2. Heparin level in 6 hours (early due to PE).  3. Daily heparin level and CBC.  4. Monitor for signs and symptoms of  bleeding. 5. Follow-up plan for life-long anticoagulation due to recurrent pulmonary embolism.   Sloan Leiter, PharmD, BCPS Clinical Pharmacist [phone] 04/12/2014,3:13 PM   Addendum: Spoke with patient and wife to discuss new heparin therapy and patient's history. Confirmed above and explained therapy plan and monitoring plan with patient. [name] was very appreciative. 5:04 PM, 04/12/2014   Addendum @ 10:49 PM:  Initial heparin level is therapeutic at 0.54 but on high side for targeting low end of goal.  Per RN, no issues with infusion or signs/symptoms of bleeding.    Plan: Decrease slightly to 1600 units/hr. Recheck level in 6 hours (ok with AM labs).   Sloan Leiter, PharmD, BCPS Clinical Pharmacist [phone], 10:51 PM

## 2014-04-13 DIAGNOSIS — I82492 Acute embolism and thrombosis of other specified deep vein of left lower extremity: Secondary | ICD-10-CM | POA: Diagnosis present

## 2014-04-13 DIAGNOSIS — Z923 Personal history of irradiation: Secondary | ICD-10-CM | POA: Diagnosis not present

## 2014-04-13 DIAGNOSIS — I10 Essential (primary) hypertension: Secondary | ICD-10-CM | POA: Diagnosis present

## 2014-04-13 DIAGNOSIS — I2510 Atherosclerotic heart disease of native coronary artery without angina pectoris: Secondary | ICD-10-CM | POA: Diagnosis present

## 2014-04-13 DIAGNOSIS — R0600 Dyspnea, unspecified: Secondary | ICD-10-CM | POA: Diagnosis present

## 2014-04-13 DIAGNOSIS — K219 Gastro-esophageal reflux disease without esophagitis: Secondary | ICD-10-CM | POA: Diagnosis present

## 2014-04-13 DIAGNOSIS — I369 Nonrheumatic tricuspid valve disorder, unspecified: Secondary | ICD-10-CM

## 2014-04-13 DIAGNOSIS — I82432 Acute embolism and thrombosis of left popliteal vein: Secondary | ICD-10-CM | POA: Diagnosis present

## 2014-04-13 DIAGNOSIS — Z8546 Personal history of malignant neoplasm of prostate: Secondary | ICD-10-CM | POA: Diagnosis not present

## 2014-04-13 DIAGNOSIS — I714 Abdominal aortic aneurysm, without rupture: Secondary | ICD-10-CM | POA: Diagnosis present

## 2014-04-13 DIAGNOSIS — Z86718 Personal history of other venous thrombosis and embolism: Secondary | ICD-10-CM

## 2014-04-13 DIAGNOSIS — I2699 Other pulmonary embolism without acute cor pulmonale: Secondary | ICD-10-CM | POA: Diagnosis present

## 2014-04-13 DIAGNOSIS — I272 Other secondary pulmonary hypertension: Secondary | ICD-10-CM | POA: Diagnosis present

## 2014-04-13 DIAGNOSIS — I82412 Acute embolism and thrombosis of left femoral vein: Secondary | ICD-10-CM | POA: Diagnosis present

## 2014-04-13 DIAGNOSIS — Z7901 Long term (current) use of anticoagulants: Secondary | ICD-10-CM | POA: Diagnosis not present

## 2014-04-13 DIAGNOSIS — Z79899 Other long term (current) drug therapy: Secondary | ICD-10-CM | POA: Diagnosis not present

## 2014-04-13 DIAGNOSIS — Z7982 Long term (current) use of aspirin: Secondary | ICD-10-CM | POA: Diagnosis not present

## 2014-04-13 DIAGNOSIS — Z87891 Personal history of nicotine dependence: Secondary | ICD-10-CM | POA: Diagnosis not present

## 2014-04-13 LAB — CBC
HCT: 37.2 % — ABNORMAL LOW (ref 39.0–52.0)
HEMOGLOBIN: 12.6 g/dL — AB (ref 13.0–17.0)
MCH: 33.2 pg (ref 26.0–34.0)
MCHC: 33.9 g/dL (ref 30.0–36.0)
MCV: 98.2 fL (ref 78.0–100.0)
Platelets: 145 10*3/uL — ABNORMAL LOW (ref 150–400)
RBC: 3.79 MIL/uL — ABNORMAL LOW (ref 4.22–5.81)
RDW: 13.3 % (ref 11.5–15.5)
WBC: 5.5 10*3/uL (ref 4.0–10.5)

## 2014-04-13 LAB — HEPARIN LEVEL (UNFRACTIONATED)
HEPARIN UNFRACTIONATED: 0.5 [IU]/mL (ref 0.30–0.70)
Heparin Unfractionated: 0.55 IU/mL (ref 0.30–0.70)

## 2014-04-13 NOTE — Progress Notes (Signed)
ANTICOAGULATION CONSULT NOTE - Follow Up Consult  Pharmacy Consult for heparin Indication: pulmonary embolus  No Known Allergies  Patient Measurements: Height: 6\' 1"  (185.4 cm) Weight: 241 lb 13.5 oz (109.7 kg) IBW/kg (Calculated) : 79.9 Heparin Dosing Weight: 103 kg  Vital Signs: Temp: 97.9 F (36.6 C) (10/21 1448) Temp Source: Oral (10/21 1448) BP: 115/65 mmHg (10/21 1448) Pulse Rate: 90 (10/21 1448)  Labs:  Recent Labs  04/11/14 0935 04/12/14 0622 04/12/14 2154 04/13/14 0240 04/13/14 1640  HGB 15.5 15.9  --  12.6*  --   HCT 45.3 46.8  --  37.2*  --   PLT 160 152  --  145*  --   LABPROT 14.3 14.4  --   --   --   INR 1.11 1.11  --   --   --   HEPARINUNFRC  --   --  0.54 0.55 0.50  CREATININE 1.17 1.05  --   --   --     Estimated Creatinine Clearance: 80.1 ml/min (by C-G formula based on Cr of 1.05).   Medications:  Scheduled:  . allopurinol  300 mg Oral Daily  . aspirin EC  81 mg Oral Daily  . fentaNYL  12.5-25 mcg Intravenous Once  . pantoprazole  40 mg Oral Daily   Infusions:  . heparin 1,500 Units/hr (04/13/14 0845)    Assessment: 73 yo male with PE is currently on slightly supratherapeutic heparin even after rate adjustment.  Heparin level this am was 0.55.  Patient has a history of subdural hematoma in 2013 (per patient he was not on anticoagulation at the time) so team wants a lower goal of heparin.  Note possible plan for IVC filter and EKOS cathter directed PE lysis.   Note hemoglobin drop today.   Goal of Therapy:  Heparin level 0.3-0.5 units/ml Monitor platelets by anticoagulation protocol: Yes   Plan:  Continue heparin to 1500 units/hr. Daily heparin level and CBC. Monitor for signs and symptoms of bleeding. F/up plan for IVC filter/ EKOS protocol.  Follow-up plan for life-long anticoagulation due to recurrent pulmonary embolism.   Sloan Leiter, PharmD, BCPS Clinical Pharmacist [phone] 04/13/2014,6:10 PM

## 2014-04-13 NOTE — Progress Notes (Signed)
This is a critical situation for [name] who needs to be fully evaluated and considered for IVC umbrella and possibly referral to Center where thromboembolectomy is performed.

## 2014-04-13 NOTE — Progress Notes (Signed)
Patient Name: [name] Date of Encounter: 04/13/2014     Active Problems:   Pulmonary HTN    SUBJECTIVE  Denies any sinigificant CP or SOB. Did not get much sleep last night, however no obvious discomfort.   CURRENT MEDS . allopurinol  300 mg Oral Daily  . aspirin EC  81 mg Oral Daily  . fentaNYL  12.5-25 mcg Intravenous Once  . pantoprazole  40 mg Oral Daily    OBJECTIVE  Filed Vitals:   04/12/14 2100 04/13/14 0031 04/13/14 0200 04/13/14 0524  BP: 113/70 113/75  125/72  Pulse: 104 85  83  Temp:  97.3 F (36.3 C)  98.1 F (36.7 C)  TempSrc:  Oral  Oral  Resp: 30 18  14   Height:      Weight:  241 lb 13.5 oz (109.7 kg)    SpO2: 89% 94% 92% 94%    Intake/Output Summary (Last 24 hours) at 04/13/14 0635 Last data filed at 04/13/14 0040  Gross per 24 hour  Intake 1432.09 ml  Output    450 ml  Net 982.09 ml   Filed Weights   04/12/14 0555 04/13/14 0031  Weight: 245 lb (111.131 kg) 241 lb 13.5 oz (109.7 kg)    PHYSICAL EXAM  General: Pleasant, NAD. Neuro: Alert and oriented X 3. Moves all extremities spontaneously. Psych: Normal affect. HEENT:  Normal  Neck: Supple without bruits or JVD. Lungs:  Resp regular and unlabored, CTA. Heart: RRR no s3, s4, or murmurs. R radial and R groin cath site stable, no obvious bleeding or hematoma Abdomen: Soft, non-tender, non-distended, BS + x 4.  Extremities: No clubbing, cyanosis or edema. DP/PT/Radials 2+ and equal bilaterally.  Accessory Clinical Findings  CBC  Recent Labs  04/12/14 0622 04/13/14 0240  WBC 5.9 5.5  HGB 15.9 12.6*  HCT 46.8 37.2*  MCV 97.9 98.2  PLT 152 174*   Basic Metabolic Panel  Recent Labs  04/11/14 0935 04/12/14 0622  NA 143 141  K 4.4 3.9  CL 105 99  CO2 21 23  GLUCOSE 121* 124*  BUN 29* 26*  CREATININE 1.17 1.05  CALCIUM 8.9 9.1   D-Dimer  Recent Labs  04/11/14 0935  DDIMER 12.57*    TELE NSR with HR 100 yesterday, HR has been trending down overnight,  currently in 80s. Some PVCs however no significant ventricular ectopy    ECG  Sinus tach, RBBB, diffuse TWI  Echocardiogram  Pending today    Radiology/Studies  Ct Angio Chest Pe W/cm &/or Wo Cm  04/12/2014   ADDENDUM REPORT: 04/12/2014 15:45  ADDENDUM: These results were called by telephone at the time of interpretation on 04/12/2014 at 3:10 pm to Dr. Elsworth Soho, who verbally acknowledged these results.   Electronically Signed   By: Julian Hy M.D.   On: 04/12/2014 15:45   04/12/2014   CLINICAL DATA:  Shortness of breath, elevated D-dimer, cardiac catheterization, history of PE, Coumadin  EXAM: CT ANGIOGRAPHY CHEST WITH CONTRAST  TECHNIQUE: Multidetector CT imaging of the chest was performed using the standard protocol during bolus administration of intravenous contrast. Multiplanar CT image reconstructions and MIPs were obtained to evaluate the vascular anatomy.  CONTRAST:  66mL OMNIPAQUE IOHEXOL 350 MG/ML SOLN  COMPARISON:  CT chest dated 04/01/2012  FINDINGS: Lobar pulmonary emboli at the bifurcation of the right main pulmonary artery as well as the takeoff of the left upper lobe pulmonary artery. Additional segmental pulmonary emboli within the left lower lobe.  Overall clot burden  is moderate to large.  Elevated RV-to-LV ratio (1.32), raising concern for right heart strain.  Evaluation of the lung parenchyma is constrained by respiratory motion. Mild dependent atelectasis in the bilateral lower lobes. Suspect mild emphysematous changes. No suspicious pulmonary nodules. No pleural effusion or pneumothorax.  Visualized thyroid is unremarkable.  Cardiomegaly. No pericardial effusion. Coronary atherosclerosis. Atherosclerotic calcifications of the aortic arch. Ectasia of the ascending thoracic aorta, measuring 3.9 cm (series 401/image 70).  No suspicious mediastinal, hilar, or axillary lymphadenopathy.  Visualized upper abdomen is notable for vascular calcifications.  Degenerative changes of the  thoracolumbar spine. Fracture involving the anterior/superior endplate of the B04 vertebral body (sagittal image 112), likely subacute but new from 10/20/2013, with mild loss of height.  Review of the MIP images confirms the above findings.  IMPRESSION: Bilateral lobar pulmonary emboli, as above. Overall clot burden is moderate to large.  Positive for acute PE with CT evidence of right heart strain (RV/LV Ratio = 1.3), consistent with at least submassive (intermediate risk) PE. The presence of right heart strain has been associated with an increased risk of morbidity and mortality. Consultation with Pulmonary and Critical Care Medicine is recommended.  Subacute fracture involving the anterior/ superior endplate of the U88 vertebral body, as above.  Electronically Signed: By: Julian Hy M.D. On: 04/12/2014 14:49    ASSESSMENT AND PLAN  1. Submassive PE  - first PE 8 yrs ago, second 2012  - systemic anticoagulation (coumadin) stopped in 2013 due to subdural hematoma  - dyspnea for 4 wks, unable to obtain wedge on cath, had elevated RV pressure and severe elevated PA pressure concerning for recurrent PE. D-dimer 12.5  - CTA of chest evidence of RV strain consistent with submassive PE in bifurcation of R main and LUL   - currently on IV heparin. Monitor mental status given h/o subdural hematoma. PCCC on board  - pending echo today to see RV pressure and strain pattern  - Cr stable, may consider transition to NOAC this afternoon or tomorrow, however with h/o subdural hematoma, may also consider coumadin for easy reversibility  - will need outpatient coagulopathy workup  2. CAD  - L&RHC 04/12/2014 RHC CO 4.64, CI 1.98, see above, LHC diffuse 30% mid LAD, 80% ostial D2, <20% LCx, 30-40% mid RCA, EF 55-65%  3. H/o Subdural hematoma, oct 2013 4. AAA 2014 5. HTN  Patient seen and examined with PA-S [place], Almyra Deforest PA-C Pager: [phone]

## 2014-04-13 NOTE — Progress Notes (Signed)
Name: [name] MRN: [id number] DOB: 05/18/1941    ADMISSION DATE:  04/12/2014 CONSULTATION DATE:  10/20  REFERRING MD :  Stanford Breed   CHIEF COMPLAINT:  Pulmonary Artery HTN   BRIEF PATIENT DESCRIPTION:  73 year old male w/ known h/o CAD, prior PE back in 2012 treated w/ 12 months coumadin, prostate CA s/p XRT 2013. Admitted to [facility] for left and right heart cath on 10/20 to evaluate dyspnea. Found to have nml LV fxn, single vessel CAD and severe PAH. PCCM asked to evaluate for PAH.   SIGNIFICANT EVENTS/ STUDIES  Admitted for evaluation of dyspnea and CP 10/20 CT angio chest 10/20:>>> submassive PE, at the bifurcation of the right main pulmonary artery as well as the takeoff of the left upper lobe pulmonary artery. Additional segmental pulmonary emboli within the left lower lobe. Mod/large clot burden.  Evidence R heart strain.  LE [place] 10/20>>>POS LLE DVT Right and left heart cath 10/20>>> > RA 13/10 mean 10 mm Hg; RV 59/8/15 mm Hg  PA 59/27 mean 39 mm Hg; PCWP N/A ; LV 122/13 mm Hg ; AO 1125/83 mean 102 mm Hg  Oxygen saturations: PA 63%; AO 94%; Cardiac Output (Fick) 4.64 L/min; Cardiac Index (Fick) 1.98 L/min meter squared.  > single vessel obstructive CAD involving ostium and second diagonal, NML LV fxn  ECHO 10/20>>>   SUBJECTIVE:  No distress.  C/o back pain.  Denies chest pain, SOB.   VITAL SIGNS: Temp:  [97.3 F (36.3 C)-98.2 F (36.8 C)] 98 F (36.7 C) (10/21 0827) Pulse Rate:  [83-108] 93 (10/21 0827) Resp:  [14-30] 16 (10/21 0827) BP: (94-157)/(63-106) 121/78 mmHg (10/21 0827) SpO2:  [89 %-95 %] 93 % (10/21 0827) Weight:  [241 lb 13.5 oz (109.7 kg)] 241 lb 13.5 oz (109.7 kg) (10/21 0031)  PHYSICAL EXAMINATION: General:  73 year old male in no acute distress.  Neuro:  Awake, oriented, no focal def  HEENT:  [place], no JVD  Cardiovascular:  Rrr, brisk CR  Lungs:  resps even non labored, no accessory muscle [place], clear   Abdomen:  Soft, non-tender + bowel sounds    Musculoskeletal:  Intact  Skin:  1+ L>R BLE swelling    Recent Labs Lab 04/11/14 0935 04/12/14 0622  NA 143 141  K 4.4 3.9  CL 105 99  CO2 21 23  BUN 29* 26*  CREATININE 1.17 1.05  GLUCOSE 121* 124*    Recent Labs Lab 04/11/14 0935 04/12/14 0622 04/13/14 0240  HGB 15.5 15.9 12.6*  HCT 45.3 46.8 37.2*  WBC 7.2 5.9 5.5  PLT 160 152 145*   Ct Angio Chest Pe W/cm &/or Wo Cm  04/12/2014   ADDENDUM REPORT: 04/12/2014 15:45  ADDENDUM: These results were called by telephone at the time of interpretation on 04/12/2014 at 3:10 pm to Dr. Elsworth Soho, who verbally acknowledged these results.   Electronically Signed   By: Julian Hy M.D.   On: 04/12/2014 15:45   04/12/2014   CLINICAL DATA:  Shortness of breath, elevated D-dimer, cardiac catheterization, history of PE, Coumadin  EXAM: CT ANGIOGRAPHY CHEST WITH CONTRAST  TECHNIQUE: Multidetector CT imaging of the chest was performed using the standard protocol during bolus administration of intravenous contrast. Multiplanar CT image reconstructions and MIPs were obtained to evaluate the vascular anatomy.  CONTRAST:  40mL OMNIPAQUE IOHEXOL 350 MG/ML SOLN  COMPARISON:  CT chest dated 04/01/2012  FINDINGS: Lobar pulmonary emboli at the bifurcation of the right main pulmonary artery as well as the  takeoff of the left upper lobe pulmonary artery. Additional segmental pulmonary emboli within the left lower lobe.  Overall clot burden is moderate to large.  Elevated RV-to-LV ratio (1.32), raising concern for right heart strain.  Evaluation of the lung parenchyma is constrained by respiratory motion. Mild dependent atelectasis in the bilateral lower lobes. Suspect mild emphysematous changes. No suspicious pulmonary nodules. No pleural effusion or pneumothorax.  Visualized thyroid is unremarkable.  Cardiomegaly. No pericardial effusion. Coronary atherosclerosis. Atherosclerotic calcifications of the aortic arch. Ectasia of the ascending thoracic aorta,  measuring 3.9 cm (series 401/image 70).  No suspicious mediastinal, hilar, or axillary lymphadenopathy.  Visualized upper abdomen is notable for vascular calcifications.  Degenerative changes of the thoracolumbar spine. Fracture involving the anterior/superior endplate of the R00 vertebral body (sagittal image 112), likely subacute but new from 10/20/2013, with mild loss of height.  Review of the MIP images confirms the above findings.  IMPRESSION: Bilateral lobar pulmonary emboli, as above. Overall clot burden is moderate to large.  Positive for acute PE with CT evidence of right heart strain (RV/LV Ratio = 1.3), consistent with at least submassive (intermediate risk) PE. The presence of right heart strain has been associated with an increased risk of morbidity and mortality. Consultation with Pulmonary and Critical Care Medicine is recommended.  Subacute fracture involving the anterior/ superior endplate of the T62 vertebral body, as above.  Electronically Signed: By: Julian Hy M.D. On: 04/12/2014 14:49    ASSESSMENT / PLAN:  Submassive PE   PAH Single vessel CAD H/o prostate CA H/o unprovoked Pulmonary Emboli (2005) SDH (2013, not on coumadin) Subacute T11 fx  LLE DVT   Recurrent PE (x3), now with submassive PE and mod/large clot burden with R heart strain.  C/b hx of SDH on anticoagulation in 2013  REC-  -Heparin gtt per pharmacy - monitor closely with hx SDH on anticoagulation  -F/u echo pending  -Likely needs lifelong anticoagulation given recurrent PE - again c/b hx bleed on coumadin -consider IVC filter, particularly if we establish that he has been intolerant to coumadin / anticoag in the past.  -Hemodynamically stable at this time - would defer catheter directed t-PA in this setting -outpt coagulopathy w/u (has seen [facility] in the past)  -Coumadin v NOAC - may prefer coumadin for closer monitoring/ reversibility with previous bleed  -Cards managing as well   Pt/wife  updated at length 10/21.  Pt is mostly concerned about excruciating back pain, ?r/t degenerative changes v T11 fx (which he thinks he sustained in a fall in September).  He has been limited in his mobility and sleeping in a recliner which may have contributed to this DVT/PE.  He understands that his PE management is most pressing but wants to consider d/w Dr Vertell Limber (who did his craniotomy in 2013).     Nickolas Madrid, NP 04/13/2014  2:03 PM Pager: [phone] or [phone]  Attending Note: Consider IVC Filter in a patient who may not be able to tolerate anticoagulation. I would proceed with anticoagulation at this time, follow for any evidence of complication before committing him to a filter.   Baltazar Apo, MD, PhD 04/13/2014, 4:12 PM [facility] [phone] or if no answer [phone]

## 2014-04-13 NOTE — Progress Notes (Addendum)
IR PA aware of request for IVC filter/possible EKOS catheter directed PE lysis. Will discuss with radiologist and evaluate patient.  Tsosie Billing PA-C Interventional Radiology  04/13/14 3:30 PM   Addendum: Dr. Pascal Lux has spoke with Dr. Elsworth Soho from [place] and Dr. Elsworth Soho does not feel IVC filter or PE lysis is indicated at this time. Please feel free to contact IR if needed.  Tsosie Billing PA-C Interventional Radiology  04/13/14  4:25 PM'

## 2014-04-13 NOTE — Progress Notes (Addendum)
ANTICOAGULATION CONSULT NOTE - Follow Up Consult  Pharmacy Consult for heparin Indication: pulmonary embolus  No Known Allergies  Patient Measurements: Height: 6\' 1"  (185.4 cm) Weight: 241 lb 13.5 oz (109.7 kg) IBW/kg (Calculated) : 79.9 Heparin Dosing Weight: 103 kg  Vital Signs: Temp: 98 F (36.7 C) (10/21 0827) Temp Source: Oral (10/21 0827) BP: 121/78 mmHg (10/21 0827) Pulse Rate: 93 (10/21 0827)  Labs:  Recent Labs  04/11/14 0935 04/12/14 0622 04/12/14 2154 04/13/14 0240  HGB 15.5 15.9  --  12.6*  HCT 45.3 46.8  --  37.2*  PLT 160 152  --  145*  LABPROT 14.3 14.4  --   --   INR 1.11 1.11  --   --   HEPARINUNFRC  --   --  0.54 0.55  CREATININE 1.17 1.05  --   --     Estimated Creatinine Clearance: 80.1 ml/min (by C-G formula based on Cr of 1.05).   Medications:  Scheduled:  . allopurinol  300 mg Oral Daily  . aspirin EC  81 mg Oral Daily  . fentaNYL  12.5-25 mcg Intravenous Once  . pantoprazole  40 mg Oral Daily   Infusions:  . heparin 1,600 Units/hr (04/13/14 5170)    Assessment: 73 yo male with PE is currently on slightly supratherapeutic heparin even after rate adjustment.  Heparin level this am was 0.55.  Patient has a history of subdural hematoma Julitza Rickles team wants a lower goal of heparin. Goal of Therapy:  Heparin level 0.3-0.5 units/ml Monitor platelets by anticoagulation protocol: Yes   Plan:  Reduce heparin to 1500 units/hr and recheck heparin level in 8 hrs to reassess Daily heparin level and CBC. (watch Hgb closely) Monitor for signs and symptoms of bleeding. Follow-up plan for life-long anticoagulation due to recurrent pulmonary embolism.    Chia Rock, Tsz-Yin 04/13/2014,8:42 AM

## 2014-04-13 NOTE — Progress Notes (Signed)
  Echocardiogram 2D Echocardiogram has been performed.  [name] 04/13/2014, 11:26 AM

## 2014-04-14 DIAGNOSIS — I2699 Other pulmonary embolism without acute cor pulmonale: Secondary | ICD-10-CM | POA: Diagnosis present

## 2014-04-14 LAB — CBC
HCT: 37.5 % — ABNORMAL LOW (ref 39.0–52.0)
Hemoglobin: 12.5 g/dL — ABNORMAL LOW (ref 13.0–17.0)
MCH: 32.1 pg (ref 26.0–34.0)
MCHC: 33.3 g/dL (ref 30.0–36.0)
MCV: 96.2 fL (ref 78.0–100.0)
PLATELETS: 151 10*3/uL (ref 150–400)
RBC: 3.9 MIL/uL — ABNORMAL LOW (ref 4.22–5.81)
RDW: 13.3 % (ref 11.5–15.5)
WBC: 5.5 10*3/uL (ref 4.0–10.5)

## 2014-04-14 LAB — URINALYSIS, ROUTINE W REFLEX MICROSCOPIC
Glucose, UA: NEGATIVE mg/dL
KETONES UR: 15 mg/dL — AB
Leukocytes, UA: NEGATIVE
NITRITE: NEGATIVE
PH: 5.5 (ref 5.0–8.0)
PROTEIN: NEGATIVE mg/dL
Specific Gravity, Urine: 1.035 — ABNORMAL HIGH (ref 1.005–1.030)
Urobilinogen, UA: 1 mg/dL (ref 0.0–1.0)

## 2014-04-14 LAB — URINE MICROSCOPIC-ADD ON

## 2014-04-14 LAB — HEPARIN LEVEL (UNFRACTIONATED): HEPARIN UNFRACTIONATED: 0.43 [IU]/mL (ref 0.30–0.70)

## 2014-04-14 NOTE — Progress Notes (Signed)
Patient with hematuria on heparin and aspirin. Records from Dr. Marin Olp reviewed and hypercoagulable workup was negative. Dr. Melven Sartorius office contacted and they will address the risk of recurrent CNS bleed.  Will DC aspirin and send a UA

## 2014-04-14 NOTE — Progress Notes (Signed)
ANTICOAGULATION CONSULT NOTE - Follow Up Consult  Pharmacy Consult for heparin Indication: pulmonary embolus  No Known Allergies  Patient Measurements: Height: 6\' 1"  (185.4 cm) Weight: 242 lb 8.1 oz (110 kg) IBW/kg (Calculated) : 79.9 Heparin Dosing Weight: 103 kg  Vital Signs: Temp: 98.3 F (36.8 C) (10/22 0539) Temp Source: Oral (10/22 0539) BP: 141/88 mmHg (10/22 0539) Pulse Rate: 81 (10/22 0539)  Labs:  Recent Labs  04/11/14 0935 04/12/14 0622  04/13/14 0240 04/13/14 1640 04/14/14 0400  HGB 15.5 15.9  --  12.6*  --  12.5*  HCT 45.3 46.8  --  37.2*  --  37.5*  PLT 160 152  --  145*  --  151  LABPROT 14.3 14.4  --   --   --   --   INR 1.11 1.11  --   --   --   --   HEPARINUNFRC  --   --   < > 0.55 0.50 0.43  CREATININE 1.17 1.05  --   --   --   --   < > = values in this interval not displayed.  Estimated Creatinine Clearance: 80.2 ml/min (by C-G formula based on Cr of 1.05).   Medications:  Scheduled:  . allopurinol  300 mg Oral Daily  . aspirin EC  81 mg Oral Daily  . fentaNYL  12.5-25 mcg Intravenous Once  . pantoprazole  40 mg Oral Daily   Infusions:  . heparin 1,500 Units/hr (04/14/14 0700)    Assessment: 18 male with PE is currently on therapeutic heparin.  Heparin level is 0.43.  Hgb 12.5 and Plt 151 K  Goal of Therapy:  Heparin level 0.3-0.5 units/ml Monitor platelets by anticoagulation protocol: Yes   Plan:  Cont heparin @ 1500 units/hr  Daily heparin level and CBC. (watch Hgb closely) Monitor for signs and symptoms of bleeding. Follow-up plan for life-long anticoagulation due to recurrent pulmonary embolism.   Isaish Alemu, Tsz-Yin 04/14/2014,9:13 AM

## 2014-04-14 NOTE — Progress Notes (Signed)
Patient Name: [name] Date of Encounter: 04/14/2014  Active Problems:   Pulmonary HTN    Patient Profile: 73 yo male w/ hx PE, nl EF, HTN, SDH 2013 (coumadin d/c'd), prostate CA, ?coagulopathy (no details available), was admitted 10/19 w/ SOB, cath w/ non-critical CAD (D2 80%), EF 55%, PAH, d-dimer > 12, found to have bilateral PE. PER RECORDS, CONFIRMED BY PHARMACIST, patient was only on Coumadin for a year (2008-2009) and was not on aspirin or any other anticoagulant/antiplatelet agent at that time of his SDH.  SUBJECTIVE: Feels a little better today.  OBJECTIVE Filed Vitals:   04/13/14 1756 04/13/14 2013 04/14/14 0032 04/14/14 0539  BP: 152/94 116/60 130/66 141/88  Pulse: 89 98 91 81  Temp: 98 F (36.7 C) 97.7 F (36.5 C) 98 F (36.7 C) 98.3 F (36.8 C)  TempSrc: Oral Oral Oral Oral  Resp: 18 15 13 17   Height:      Weight:   242 lb 8.1 oz (110 kg)   SpO2: 94% 93% 91% 96%    Intake/Output Summary (Last 24 hours) at 04/14/14 0626 Last data filed at 04/13/14 2100  Gross per 24 hour  Intake 824.12 ml  Output    800 ml  Net  24.12 ml   Filed Weights   04/12/14 0555 04/13/14 0031 04/14/14 0032  Weight: 245 lb (111.131 kg) 241 lb 13.5 oz (109.7 kg) 242 lb 8.1 oz (110 kg)    PHYSICAL EXAM General: Well developed, well nourished, male in no acute distress. Head: Normocephalic, atraumatic.  Neck: Supple without bruits, JVD 8 cm. Lungs:  Resp regular and unlabored, CTA. Heart: RRR, S1, S2, no S3, S4, or murmur; no rub. Abdomen: Soft, non-tender, non-distended, BS + x 4.  Extremities: No clubbing, cyanosis, no edema. Right groin without hematoma, some ecchymosis Neuro: Alert and oriented X 3. Moves all extremities spontaneously. Psych: Normal affect.  LABS: CBC: Recent Labs  04/13/14 0240 04/14/14 0400  WBC 5.5 5.5  HGB 12.6* 12.5*  HCT 37.2* 37.5*  MCV 98.2 96.2  PLT 145* 151   INR: Recent Labs  04/12/14 0622  INR 1.49   Basic Metabolic  Panel: Recent Labs  04/11/14 0935 04/12/14 0622  NA 143 141  K 4.4 3.9  CL 105 99  CO2 21 23  GLUCOSE 121* 124*  BUN 29* 26*  CREATININE 1.17 1.05  CALCIUM 8.9 9.1   D-dimer: Recent Labs  04/11/14 0935  DDIMER 12.57*   TELE:  SR, PVCs occ.  ECHO: 04/13/2014 Study Conclusions - Left ventricle: Technically limited study. There is some flattening of the septum in systole. The cavity size was normal. Wall thickness was increased in a pattern of mild LVH. The estimated ejection fraction was 55%. Regional wall motion abnormalities cannot be excluded. - Aorta: Slight dilitation of aortic root. - Right ventricle: Compared to the LV, the RV is dilated. Visually there is RV dysfunction. The RV doppler (TAPSE) looks normal. I can not explain this, as I feel there is RV dysfunction. - Right atrium: The atrium was moderately dilated. - Pulmonary arteries: PA peak pressure: 64 mm Hg (S).   Radiology/Studies: Ct Angio Chest Pe W/cm &/or Wo Cm 04/12/2014   ADDENDUM REPORT: 04/12/2014 15:45  ADDENDUM: These results were called by telephone at the time of interpretation on 04/12/2014 at 3:10 pm to Dr. Elsworth Soho, who verbally acknowledged these results.   Electronically Signed   By: Julian Hy M.D.   On: 04/12/2014 15:45  04/12/2014  CLINICAL DATA:  Shortness of breath, elevated D-dimer, cardiac catheterization, history of PE, Coumadin  EXAM: CT ANGIOGRAPHY CHEST WITH CONTRAST  TECHNIQUE: Multidetector CT imaging of the chest was performed using the standard protocol during bolus administration of intravenous contrast. Multiplanar CT image reconstructions and MIPs were obtained to evaluate the vascular anatomy.  CONTRAST:  74mL OMNIPAQUE IOHEXOL 350 MG/ML SOLN  COMPARISON:  CT chest dated 04/01/2012  FINDINGS: Lobar pulmonary emboli at the bifurcation of the right main pulmonary artery as well as the takeoff of the left upper lobe pulmonary artery. Additional segmental pulmonary emboli within  the left lower lobe.  Overall clot burden is moderate to large.  Elevated RV-to-LV ratio (1.32), raising concern for right heart strain.  Evaluation of the lung parenchyma is constrained by respiratory motion. Mild dependent atelectasis in the bilateral lower lobes. Suspect mild emphysematous changes. No suspicious pulmonary nodules. No pleural effusion or pneumothorax.  Visualized thyroid is unremarkable.  Cardiomegaly. No pericardial effusion. Coronary atherosclerosis. Atherosclerotic calcifications of the aortic arch. Ectasia of the ascending thoracic aorta, measuring 3.9 cm (series 401/image 70).  No suspicious mediastinal, hilar, or axillary lymphadenopathy.  Visualized upper abdomen is notable for vascular calcifications.  Degenerative changes of the thoracolumbar spine. Fracture involving the anterior/superior endplate of the J94 vertebral body (sagittal image 112), likely subacute but new from 10/20/2013, with mild loss of height.  Review of the MIP images confirms the above findings.  IMPRESSION: Bilateral lobar pulmonary emboli, as above. Overall clot burden is moderate to large.  Positive for acute PE with CT evidence of right heart strain (RV/LV Ratio = 1.3), consistent with at least submassive (intermediate risk) PE. The presence of right heart strain has been associated with an increased risk of morbidity and mortality. Consultation with Pulmonary and Critical Care Medicine is recommended.  Subacute fracture involving the anterior/ superior endplate of the R74 vertebral body, as above.  Electronically Signed: By: Julian Hy M.D. On: 04/12/2014 14:49     Current Medications:  . allopurinol  300 mg Oral Daily  . aspirin EC  81 mg Oral Daily  . fentaNYL  12.5-25 mcg Intravenous Once  . pantoprazole  40 mg Oral Daily   . heparin 1,500 Units/hr (04/14/14 0004)    ASSESSMENT AND PLAN: 1. Submassive PE  - first PE 8 yrs ago, second 2012  - systemic anticoagulation (coumadin) stopped in  2013 due to subdural hematoma  - dyspnea for 4 wks, unable to obtain wedge on cath, had elevated RV pressure and severe elevated PA pressure concerning for recurrent PE. D-dimer 12.5  - CTA of chest evidence of RV strain consistent with submassive PE in bifurcation of R main and LUL  - currently on IV heparin. Monitor mental status given h/o subdural hematoma. CCM on board  - echo 10/21 w/ ? RV dysfunction, results above  - Cr stable, may consider transition to NOAC if appropriate, however with h/o subdural hematoma, coumadin may be better for easy reversibility  - will need outpatient coagulopathy workup - pt unable to remember what Dr. Marin Olp said, will get records   2. CAD  - L&RHC 04/12/2014 RHC CO 4.64, CI 1.98, see above, LHC diffuse 30% mid LAD, 80% ostial D2, <20% LCx, 30-40% mid RCA, EF 55-65%   3. H/o Subdural hematoma, oct 2013   4. AAA, 3.3x3.0 cm by US10/2014  5. HTN   Active Problems:   Pulmonary HTN   Signed, Rosaria Ferries , PA-C 6:26 AM 04/14/2014  History and all  data above reviewed.  Patient examined.  I agree with the findings as above.  The patient exam reveals He has had some hematuria.  Breathing is OK.  COR: RRR,  ,  Lungs: Decreased BS  ,  Abd: Positive bowel sounds, no rebound no guarding, Ext Mild edema  .  All available labs, radiology testing, previous records reviewed. Agree with documented assessment and plan. Massive PE:  CT reviewed!  He needs anticoagulation lifetime unless neurosurgery suggests (which I would find unlikely) from his previous CNS bleed that there is an absolute contraindication.  To complicate matters he has some hematuria.  However, he has had radiation for his prostate.  He has follow up in a couple of weeks with Dr. Jeffie Pollock.  I would suggest that we use warfarin although as reversal agents become available we could consider Pradaxa in the future.  I will defer to pulmonary any consideration of a filter.  However, I am not sure what this  will add if we are now starting warfarin.  Long discussion with the patient and his wife.   Jeneen Rinks Jelisha Weed  10:11 AM  04/14/2014

## 2014-04-14 NOTE — Progress Notes (Signed)
Name: [name] MRN: [id number] DOB: 22-Nov-1940    ADMISSION DATE:  04/12/2014 CONSULTATION DATE:  10/20  REFERRING MD :  Stanford Breed   CHIEF COMPLAINT:  Pulmonary Artery HTN   BRIEF PATIENT DESCRIPTION:  73 year old male w/ known h/o CAD, prior PE back in 2012 treated w/ 12 months coumadin, prostate CA s/p XRT 2013. Admitted to [facility]  for left and right heart cath on 10/20 to evaluate dyspnea. Found to have nml LV fxn, single vessel CAD and severe PAH. PCCM asked to evaluate for PAH.   SIGNIFICANT EVENTS/ STUDIES  Admitted for evaluation of dyspnea and CP 10/20 CT angio chest 10/20:>>> submassive PE, at the bifurcation of the right main pulmonary artery as well as the takeoff of the left upper lobe pulmonary artery. Additional segmental pulmonary emboli within the left lower lobe. Mod/large clot burden.  Evidence R heart strain.  LE [place] 10/20>>>POS LLE DVT Right and left heart cath 10/20>>> > RA 13/10 mean 10 mm Hg; RV 59/8/15 mm Hg  PA 59/27 mean 39 mm Hg; PCWP N/A ; LV 122/13 mm Hg ; AO 1125/83 mean 102 mm Hg  Oxygen saturations: PA 63%; AO 94%; Cardiac Output (Fick) 4.64 L/min; Cardiac Index (Fick) 1.98 L/min meter squared.  > single vessel obstructive CAD involving ostium and second diagonal, NML LV fxn  ECHO 10/20>>>EF 55%, dilated RV, mod dilated RA, PA pressure 61mmHg   SUBJECTIVE:  No distress.  C/o back pain.  Denies chest pain, SOB. Some bleeding from urethra (mod amount, saturated a towel per CNA).  Denies abd pain.   VITAL SIGNS: Temp:  [97.7 F (36.5 C)-98.3 F (36.8 C)] 98.3 F (36.8 C) (10/22 0539) Pulse Rate:  [81-98] 81 (10/22 0539) Resp:  [13-18] 17 (10/22 0539) BP: (115-152)/(60-94) 141/88 mmHg (10/22 0539) SpO2:  [91 %-96 %] 96 % (10/22 0539) Weight:  [242 lb 8.1 oz (110 kg)] 242 lb 8.1 oz (110 kg) (10/22 0032)  PHYSICAL EXAMINATION: General:  73 year old male in no acute distress.  Neuro:  Awake, oriented, no focal def  HEENT:  [place], no JVD    Cardiovascular:  Rrr, brisk CR  Lungs:  resps even non labored, no accessory muscle [place], clear   Abdomen:  Soft, non-tender + bowel sounds  Musculoskeletal:  Intact  Skin:  1+ L>R BLE swelling    Recent Labs Lab 04/11/14 0935 04/12/14 0622  NA 143 141  K 4.4 3.9  CL 105 99  CO2 21 23  BUN 29* 26*  CREATININE 1.17 1.05  GLUCOSE 121* 124*    Recent Labs Lab 04/12/14 0622 04/13/14 0240 04/14/14 0400  HGB 15.9 12.6* 12.5*  HCT 46.8 37.2* 37.5*  WBC 5.9 5.5 5.5  PLT 152 145* 151   Ct Angio Chest Pe W/cm &/or Wo Cm  04/12/2014   ADDENDUM REPORT: 04/12/2014 15:45  ADDENDUM: These results were called by telephone at the time of interpretation on 04/12/2014 at 3:10 pm to Dr. Elsworth Soho, who verbally acknowledged these results.   Electronically Signed   By: Julian Hy M.D.   On: 04/12/2014 15:45   04/12/2014   CLINICAL DATA:  Shortness of breath, elevated D-dimer, cardiac catheterization, history of PE, Coumadin  EXAM: CT ANGIOGRAPHY CHEST WITH CONTRAST  TECHNIQUE: Multidetector CT imaging of the chest was performed using the standard protocol during bolus administration of intravenous contrast. Multiplanar CT image reconstructions and MIPs were obtained to evaluate the vascular anatomy.  CONTRAST:  39mL OMNIPAQUE IOHEXOL 350 MG/ML SOLN  COMPARISON:  CT chest dated 04/01/2012  FINDINGS: Lobar pulmonary emboli at the bifurcation of the right main pulmonary artery as well as the takeoff of the left upper lobe pulmonary artery. Additional segmental pulmonary emboli within the left lower lobe.  Overall clot burden is moderate to large.  Elevated RV-to-LV ratio (1.32), raising concern for right heart strain.  Evaluation of the lung parenchyma is constrained by respiratory motion. Mild dependent atelectasis in the bilateral lower lobes. Suspect mild emphysematous changes. No suspicious pulmonary nodules. No pleural effusion or pneumothorax.  Visualized thyroid is unremarkable.  Cardiomegaly. No  pericardial effusion. Coronary atherosclerosis. Atherosclerotic calcifications of the aortic arch. Ectasia of the ascending thoracic aorta, measuring 3.9 cm (series 401/image 70).  No suspicious mediastinal, hilar, or axillary lymphadenopathy.  Visualized upper abdomen is notable for vascular calcifications.  Degenerative changes of the thoracolumbar spine. Fracture involving the anterior/superior endplate of the W65 vertebral body (sagittal image 112), likely subacute but new from 10/20/2013, with mild loss of height.  Review of the MIP images confirms the above findings.  IMPRESSION: Bilateral lobar pulmonary emboli, as above. Overall clot burden is moderate to large.  Positive for acute PE with CT evidence of right heart strain (RV/LV Ratio = 1.3), consistent with at least submassive (intermediate risk) PE. The presence of right heart strain has been associated with an increased risk of morbidity and mortality. Consultation with Pulmonary and Critical Care Medicine is recommended.  Subacute fracture involving the anterior/ superior endplate of the K81 vertebral body, as above.  Electronically Signed: By: Julian Hy M.D. On: 04/12/2014 14:49    ASSESSMENT / PLAN:  Submassive PE   PAH Single vessel CAD H/o prostate CA H/o unprovoked Pulmonary Emboli (2005) SDH (2013, not on coumadin) Subacute T11 fx  LLE DVT   Recurrent PE (x3), now with submassive PE and mod/large clot burden with R heart strain.  Anticoag decision making C/b hx of SDH   REC-  -Heparin gtt per pharmacy - monitor closely with hx SDH on anticoagulation  -Will need lifelong anticoagulation given recurrent PE if he is able to tolerate -mild bleeding (urethra) on heparin gtt  -Agree with starting warfarin - hold off on IVC filter unless he proves unable to tolerate warfarin  Please call if we can assist in any way    Nickolas Madrid, NP 04/14/2014  9:50 AM Pager: [phone] or [phone]   Baltazar Apo,  MD, PhD 04/14/2014, 11:22 AM [facility] [phone] or if no answer [phone]

## 2014-04-14 NOTE — Progress Notes (Signed)
Notified Rosaria Ferries PA of continued bleeding from urethra and patient and patients wife verbalizing concerns regarding above. Notified that urinalysis resulted. As instructed reviewed information with patient and wife regarding plan of care, results of urinalysis, and collaboration of all disciplines in discussion of best treatment. Measures to decrease bleeding risk, dc of aspirin evaluating urinalysis and monitoring. Assured patient and wife that at this time treatment for embolism was primary concern but bleeding was being followed carefully with assessment and blood work to evaluate H & H. Questions answered with verbalized understanding by both patient and wife of information given and plan of care.

## 2014-04-14 NOTE — Progress Notes (Signed)
Patient ID: [name], male   DOB: [birth year], 73 y.o.   MRN: [id number] Call rec'd with report of pt's admission with PE & DVT, in need of anticoagulation therapy.  Input requested XI:DHWYSH of anticoagulating with hx of SAH or SDH.  Op note from October 2013 reviewed: left craniectomy for evacuation of subdural hematoma for dx subdural hematoma with hemiparesis and aphasia.  Per Dr. Vertell Limber, subdural hematoma now resolved, does not contraindicate anticoagulation. Ok to anticoagulate as needed.  Verdis Prime, RN, BSN

## 2014-04-15 LAB — CBC
HCT: 36.7 % — ABNORMAL LOW (ref 39.0–52.0)
HEMOGLOBIN: 12.4 g/dL — AB (ref 13.0–17.0)
MCH: 32.5 pg (ref 26.0–34.0)
MCHC: 33.8 g/dL (ref 30.0–36.0)
MCV: 96.3 fL (ref 78.0–100.0)
Platelets: 146 10*3/uL — ABNORMAL LOW (ref 150–400)
RBC: 3.81 MIL/uL — AB (ref 4.22–5.81)
RDW: 13.3 % (ref 11.5–15.5)
WBC: 4.6 10*3/uL (ref 4.0–10.5)

## 2014-04-15 LAB — HEPARIN LEVEL (UNFRACTIONATED)
Heparin Unfractionated: 0.29 IU/mL — ABNORMAL LOW (ref 0.30–0.70)
Heparin Unfractionated: 0.49 IU/mL (ref 0.30–0.70)

## 2014-04-15 MED ORDER — COUMADIN BOOK
1.0000 | Freq: Once | Status: AC
  Administered 2014-04-15: 17:00:00 1
  Filled 2014-04-15 (×2): qty 1

## 2014-04-15 MED ORDER — WARFARIN VIDEO
1.0000 | Freq: Once | Status: AC
  Administered 2014-04-16: 1

## 2014-04-15 MED ORDER — WARFARIN - PHARMACIST DOSING INPATIENT
Freq: Every day | Status: DC
  Administered 2014-04-15 – 2014-04-22 (×4)

## 2014-04-15 MED ORDER — WARFARIN SODIUM 7.5 MG PO TABS
7.5000 mg | ORAL_TABLET | Freq: Once | ORAL | Status: AC
  Administered 2014-04-15: 7.5 mg via ORAL
  Filled 2014-04-15: qty 1

## 2014-04-15 NOTE — Progress Notes (Addendum)
ANTICOAGULATION CONSULT NOTE - Initial Consult  Pharmacy Consult for Coumadin Indication: pulmonary embolus and DVT  No Known Allergies  Patient Measurements: Height: 6\' 1"  (185.4 cm) Weight: 240 lb 8.4 oz (109.1 kg) IBW/kg (Calculated) : 79.9 Heparin Dosing Weight:   Vital Signs: Temp: 98 F (36.7 C) (10/23 0815) Temp Source: Oral (10/23 0815) BP: 134/63 mmHg (10/23 0815) Pulse Rate: 86 (10/23 0815)  Labs:  Recent Labs  04/13/14 0240  04/14/14 0400 04/15/14 0327 04/15/14 1112  HGB 12.6*  --  12.5* 12.4*  --   HCT 37.2*  --  37.5* 36.7*  --   PLT 145*  --  151 146*  --   HEPARINUNFRC 0.55  < > 0.43 0.29* 0.49  < > = values in this interval not displayed.  Estimated Creatinine Clearance: 80 ml/min (by C-G formula based on Cr of 1.05).   Medical History: Past Medical History  Diagnosis Date  . Gout   . GERD (gastroesophageal reflux disease)   . Barrett's esophagus 2009  . FH: colonic polyps   . Arthritis   . Hypertension   . Prostate cancer 12/2011    Found on routine screening PSA, planning for implants by Dr. Sondra Come 10/16   . Pulmonary embolus 2005    Bilateral, coumadin x 1 years, inpatient for 5 days  . Subdural hematoma   . Infected sebaceous cyst back 7x5x4cm s/p I&D [id number] 01/11/2013  . History of radiation therapy 06/22/2012-08/18/2012    78 Gy to prostate  . AAA (abdominal aortic aneurysm)   . CAD (coronary artery disease)     Noted to have coronary calcification on CT  . Shortness of breath   . Pulmonary HTN 04/12/2014    Medications:  Scheduled:  . allopurinol  300 mg Oral Daily  . fentaNYL  12.5-25 mcg Intravenous Once  . pantoprazole  40 mg Oral Daily    Assessment: 73yo male with DVT and PE, (+) hx of SDH resolved as of Oct '13 and anticoagulation OK'd by Neurosurgery.  Baseline INR 1.11 on 10/20.  Pt with (+)hematuria- improving.  MD note this AM indicated plan to check Urine Cx, but not ordered.  RN will contact PA.  Heparin was  adjusted overnight; repeat level is in process.  Hg 12.4 and pltc 146- both are stable.  A 48hr overlap with INR > 2 is planned.  Goal of Therapy:  INR 2-3 and Heparin level 0.3-0.5 Monitor platelets by anticoagulation protocol: Yes   Plan:  F/U heparin level Coumadin 7.5mg  Daily INR Watch for further signs of bleeding  Gracy Bruins, PharmD [facility]     10/23  Addendum. 1200  Heparin level 0.49, within goal of 0.3-0.5.  Will continue current Heparin rate of 1600 units/hr and f/u in AM.  Gracy Bruins, PharmD Clinical Pharmacist [facility]

## 2014-04-15 NOTE — Progress Notes (Signed)
ANTICOAGULATION CONSULT NOTE - Follow Up Consult  Pharmacy Consult for heparin Indication: pulmonary embolus and DVT  Labs:  Recent Labs  04/12/14 0622  04/13/14 0240 04/13/14 1640 04/14/14 0400 04/15/14 0327  HGB 15.9  --  12.6*  --  12.5* 12.4*  HCT 46.8  --  37.2*  --  37.5* 36.7*  PLT 152  --  145*  --  151 146*  LABPROT 14.4  --   --   --   --   --   INR 1.11  --   --   --   --   --   HEPARINUNFRC  --   < > 0.55 0.50 0.43 0.29*  CREATININE 1.05  --   --   --   --   --   < > = values in this interval not displayed.   Assessment: 73yo male now slightly subtherapeutic on heparin after two levels at goal and trending down; per neurosurgery hematoma is resolved and they okay anticoag.  Goal of Therapy:  Heparin level 0.3-0.5 units/ml   Plan:  Will increase heparin gtt slightly to 1600 units/hr and check level in 6hr.  Wynona Neat, PharmD, BCPS  04/15/2014,5:18 AM

## 2014-04-15 NOTE — Progress Notes (Signed)
SUBJECTIVE:  No acute SOB.  No chest pain.    PHYSICAL EXAM Filed Vitals:   04/14/14 1746 04/14/14 1952 04/14/14 2352 04/15/14 0418  BP: 121/66 121/61 129/83 151/77  Pulse: 81 88 84 79  Temp: 98 F (36.7 C) 98.1 F (36.7 C) 97.9 F (36.6 C) 98.2 F (36.8 C)  TempSrc: Oral Oral Oral Oral  Resp: 18 18 18 18   Height:      Weight:   240 lb 8.4 oz (109.1 kg)   SpO2: 94% 93% 94% 96%   General:  No acute distress Lungs:  Clear Heart:  RRR Abdomen:  Positive bowel sounds, no rebound no guarding Extremities:  No edema   LABS: Lab Results  Component Value Date   TROPONINI <0.30 04/03/2012   Results for orders placed during the hospital encounter of 04/12/14 (from the past 24 hour(s))  URINALYSIS, ROUTINE W REFLEX MICROSCOPIC     Status: Abnormal   Collection Time    04/14/14  2:00 PM      Result Value Ref Range   Color, Urine AMBER (*) YELLOW   APPearance HAZY (*) CLEAR   Specific Gravity, Urine 1.035 (*) 1.005 - 1.030   pH 5.5  5.0 - 8.0   Glucose, UA NEGATIVE  NEGATIVE mg/dL   Hgb urine dipstick LARGE (*) NEGATIVE   Bilirubin Urine SMALL (*) NEGATIVE   Ketones, ur 15 (*) NEGATIVE mg/dL   Protein, ur NEGATIVE  NEGATIVE mg/dL   Urobilinogen, UA 1.0  0.0 - 1.0 mg/dL   Nitrite NEGATIVE  NEGATIVE   Leukocytes, UA NEGATIVE  NEGATIVE  URINE MICROSCOPIC-ADD ON     Status: Abnormal   Collection Time    04/14/14  2:00 PM      Result Value Ref Range   WBC, UA 0-2  <3 WBC/hpf   RBC / HPF TOO NUMEROUS TO COUNT  <3 RBC/hpf   Bacteria, UA MANY (*) RARE   Urine-Other MUCOUS PRESENT    HEPARIN LEVEL (UNFRACTIONATED)     Status: Abnormal   Collection Time    04/15/14  3:27 AM      Result Value Ref Range   Heparin Unfractionated 0.29 (*) 0.30 - 0.70 IU/mL  CBC     Status: Abnormal   Collection Time    04/15/14  3:27 AM      Result Value Ref Range   WBC 4.6  4.0 - 10.5 K/uL   RBC 3.81 (*) 4.22 - 5.81 MIL/uL   Hemoglobin 12.4 (*) 13.0 - 17.0 g/dL   HCT 36.7 (*) 39.0 - 52.0  %   MCV 96.3  78.0 - 100.0 fL   MCH 32.5  26.0 - 34.0 pg   MCHC 33.8  30.0 - 36.0 g/dL   RDW 13.3  11.5 - 15.5 %   Platelets 146 (*) 150 - 400 K/uL    Intake/Output Summary (Last 24 hours) at 04/15/14 0753 Last data filed at 04/14/14 2353  Gross per 24 hour  Intake    780 ml  Output    850 ml  Net    -70 ml     ASSESSMENT AND PLAN:  PULMONARY EMBOLISM:  OK to anticoagulate per neurosurgery.  Discussed also with Pulmonary.  No IVC filter.    He will need at least a 48 hour overlap after INR greater than 2.    HEMATURIA:  Send culture if not already done.         Jeneen Rinks [facility] 04/15/2014 7:53 AM

## 2014-04-16 LAB — URINE CULTURE
COLONY COUNT: NO GROWTH
CULTURE: NO GROWTH
Special Requests: NORMAL

## 2014-04-16 LAB — CBC
HCT: 36.7 % — ABNORMAL LOW (ref 39.0–52.0)
Hemoglobin: 12.3 g/dL — ABNORMAL LOW (ref 13.0–17.0)
MCH: 32.2 pg (ref 26.0–34.0)
MCHC: 33.5 g/dL (ref 30.0–36.0)
MCV: 96.1 fL (ref 78.0–100.0)
Platelets: 158 10*3/uL (ref 150–400)
RBC: 3.82 MIL/uL — ABNORMAL LOW (ref 4.22–5.81)
RDW: 13.2 % (ref 11.5–15.5)
WBC: 4.7 10*3/uL (ref 4.0–10.5)

## 2014-04-16 LAB — PROTIME-INR
INR: 1.14 (ref 0.00–1.49)
PROTHROMBIN TIME: 14.7 s (ref 11.6–15.2)

## 2014-04-16 LAB — HEPARIN LEVEL (UNFRACTIONATED): HEPARIN UNFRACTIONATED: 0.37 [IU]/mL (ref 0.30–0.70)

## 2014-04-16 MED ORDER — WARFARIN SODIUM 7.5 MG PO TABS
7.5000 mg | ORAL_TABLET | Freq: Once | ORAL | Status: AC
  Administered 2014-04-16: 7.5 mg via ORAL
  Filled 2014-04-16: qty 1

## 2014-04-16 NOTE — Progress Notes (Signed)
The patient's wife informed us of gross blood from penis.  UA confirmed large Hgb.  Culture is pending.  May need urology to see.  Continue to monitor Hgb which is 12.3 today. [name]   Tarri Fuller PAC

## 2014-04-16 NOTE — Progress Notes (Addendum)
Patient's wife was in the room and brought a concern to my attention. She stated that the patient has been bleeding from his penis for 3 days. She stated that he hasn't been telling anyone and she has been cleaning him up at least 4 times a day. She showed me and there was a wash cloth saturated with blood and a medium sized blood clot as well. Will notify Dr. Wynonia Lawman.

## 2014-04-16 NOTE — Progress Notes (Signed)
[place] for Coumadin, Heparin Indication: pulmonary embolus and DVT  No Known Allergies  Patient Measurements: Height: 6\' 1"  (185.4 cm) Weight: 240 lb 8.4 oz (109.1 kg) IBW/kg (Calculated) : 79.9   Vital Signs: Temp: 97.5 F (36.4 C) (10/24 0500) Temp Source: Oral (10/24 0500) BP: 141/72 mmHg (10/24 0500) Pulse Rate: 78 (10/24 0500)  Labs:  Recent Labs  04/14/14 0400 04/15/14 0327 04/15/14 1112 04/16/14 0239  HGB 12.5* 12.4*  --  12.3*  HCT 37.5* 36.7*  --  36.7*  PLT 151 146*  --  158  LABPROT  --   --   --  14.7  INR  --   --   --  1.14  HEPARINUNFRC 0.43 0.29* 0.49 0.37    Estimated Creatinine Clearance: 80 ml/min (by C-G formula based on Cr of 1.05).   Medical History: Past Medical History  Diagnosis Date  . Gout   . GERD (gastroesophageal reflux disease)   . Barrett's esophagus 2009  . FH: colonic polyps   . Arthritis   . Hypertension   . Prostate cancer 12/2011    Found on routine screening PSA, planning for implants by Dr. Sondra Come 10/16   . Pulmonary embolus 2005    Bilateral, coumadin x 1 years, inpatient for 5 days  . Subdural hematoma   . Infected sebaceous cyst back 7x5x4cm s/p I&D [id number] 01/11/2013  . History of radiation therapy 06/22/2012-08/18/2012    78 Gy to prostate  . AAA (abdominal aortic aneurysm)   . CAD (coronary artery disease)     Noted to have coronary calcification on CT  . Shortness of breath   . Pulmonary HTN 04/12/2014    Medications:  Scheduled:  . allopurinol  300 mg Oral Daily  . fentaNYL  12.5-25 mcg Intravenous Once  . pantoprazole  40 mg Oral Daily  . warfarin  1 each Does not apply Once  . Warfarin - Pharmacist Dosing Inpatient   Does not apply q1800    Assessment: 73yo male with DVT and PE, (+) hx of SDH resolved as of Oct '13 and anticoagulation OK'd by Neurosurgery.  Pt with (+)hematuria and bleeding from penis  Hg stable.HL this am 0.37 and INR 1.14  A 48hr overlap  with INR > 2 is planned.  Goal of Therapy:  INR 2-3 and Heparin level 0.3-0.5 Monitor platelets by anticoagulation protocol: Yes   Plan:  Cont heparin at 1600 units/hr Coumadin 7.5mg  Daily INR , heparin level and CBC Watch for further signs of bleeding  Thanks for allowing pharmacy to be a part of this patient's care.  Excell Seltzer, PharmD Clinical Pharmacist, [phone]

## 2014-04-16 NOTE — Progress Notes (Signed)
Subjective:  No shortness of breath or chest pain  Objective:  Vital Signs in the last 24 hours: BP 141/72  Pulse 78  Temp(Src) 97.5 F (36.4 C) (Oral)  Resp 18  Ht 6\' 1"  (1.854 m)  Wt 109.1 kg (240 lb 8.4 oz)  BMI 31.74 kg/m2  SpO2 97%  Physical Exam: Pleasant white male in no acute distress Lungs:  Clear  Cardiac:  Regular rhythm, normal S1 and S2, no S3 Extremities:  No edema present  Intake/Output from previous day: 10/23 0701 - 10/24 0700 In: 1033.9 [P.O.:680; I.V.:353.9] Out: 900 [Urine:900] Weight Filed Weights   04/13/14 0031 04/14/14 0032 04/14/14 2352  Weight: 109.7 kg (241 lb 13.5 oz) 110 kg (242 lb 8.1 oz) 109.1 kg (240 lb 8.4 oz)    Lab Results: Basic Metabolic Panel: No results found for this basename: NA, K, CL, CO2, GLUCOSE, BUN, CREATININE,  in the last 72 hours  CBC:  Recent Labs  04/15/14 0327 04/16/14 0239  WBC 4.6 4.7  HGB 12.4* 12.3*  HCT 36.7* 36.7*  MCV 96.3 96.1  PLT 146* 158    BNP    Component Value Date/Time   PROBNP 31.0 03/30/2012 1245    PROTIME: Lab Results  Component Value Date   INR 1.14 04/16/2014   INR 1.11 04/12/2014   INR 1.11 04/11/2014    Telemetry: Sinus rhythm  Assessment/Plan:  1. Acute pulmonary emboli currently being anticoagulated 2. Anticoagulation currently intravenous heparin being transitioned to warfarin  Recommendations:  Continue heparin until INR therapeutic and then bridge afterwards.      Kerry Hough  MD [facility] Cardiology  04/16/2014, 9:32 AM

## 2014-04-17 LAB — CBC
HCT: 36.1 % — ABNORMAL LOW (ref 39.0–52.0)
Hemoglobin: 12.3 g/dL — ABNORMAL LOW (ref 13.0–17.0)
MCH: 32.3 pg (ref 26.0–34.0)
MCHC: 34.1 g/dL (ref 30.0–36.0)
MCV: 94.8 fL (ref 78.0–100.0)
PLATELETS: 166 10*3/uL (ref 150–400)
RBC: 3.81 MIL/uL — ABNORMAL LOW (ref 4.22–5.81)
RDW: 13 % (ref 11.5–15.5)
WBC: 4.3 10*3/uL (ref 4.0–10.5)

## 2014-04-17 LAB — PROTIME-INR
INR: 1.15 (ref 0.00–1.49)
PROTHROMBIN TIME: 14.9 s (ref 11.6–15.2)

## 2014-04-17 LAB — HEPARIN LEVEL (UNFRACTIONATED): Heparin Unfractionated: 0.45 IU/mL (ref 0.30–0.70)

## 2014-04-17 MED ORDER — WARFARIN SODIUM 7.5 MG PO TABS
7.5000 mg | ORAL_TABLET | Freq: Once | ORAL | Status: AC
  Administered 2014-04-17: 7.5 mg via ORAL
  Filled 2014-04-17: qty 1

## 2014-04-17 NOTE — Progress Notes (Signed)
[place] for Coumadin, Heparin Indication: pulmonary embolus and DVT  No Known Allergies  Patient Measurements: Height: 6\' 1"  (185.4 cm) Weight: 242 lb 11.6 oz (110.1 kg) IBW/kg (Calculated) : 79.9   Vital Signs: Temp: 98.4 F (36.9 C) (10/25 0436) Temp Source: Oral (10/25 0436) BP: 136/68 mmHg (10/25 0436) Pulse Rate: 69 (10/25 0436)  Labs:  Recent Labs  04/15/14 0327 04/15/14 1112 04/16/14 0239 04/17/14 0338  HGB 12.4*  --  12.3* 12.3*  HCT 36.7*  --  36.7* 36.1*  PLT 146*  --  158 166  LABPROT  --   --  14.7 14.9  INR  --   --  1.14 1.15  HEPARINUNFRC 0.29* 0.49 0.37 0.45    Estimated Creatinine Clearance: 80.3 ml/min (by C-G formula based on Cr of 1.05).   Medical History: Past Medical History  Diagnosis Date  . Gout   . GERD (gastroesophageal reflux disease)   . Barrett's esophagus 2009  . FH: colonic polyps   . Arthritis   . Hypertension   . Prostate cancer 12/2011    Found on routine screening PSA, planning for implants by Dr. Sondra Come 10/16   . Pulmonary embolus 2005    Bilateral, coumadin x 1 years, inpatient for 5 days  . Subdural hematoma   . Infected sebaceous cyst back 7x5x4cm s/p I&D [id number] 01/11/2013  . History of radiation therapy 06/22/2012-08/18/2012    78 Gy to prostate  . AAA (abdominal aortic aneurysm)   . CAD (coronary artery disease)     Noted to have coronary calcification on CT  . Shortness of breath   . Pulmonary HTN 04/12/2014    Medications:  Scheduled:  . allopurinol  300 mg Oral Daily  . fentaNYL  12.5-25 mcg Intravenous Once  . pantoprazole  40 mg Oral Daily  . Warfarin - Pharmacist Dosing Inpatient   Does not apply q1800    Assessment: 73yo male with DVT and PE, (+) hx of SDH resolved as of Oct '13 and anticoagulation OK'd by Neurosurgery.  Pt with (+)hematuria and bleeding from penis  Hg stable.HL this am 0.45 and INR 1.15  A 48hr overlap with INR > 2 is planned.  Goal of  Therapy:  INR 2-3 and Heparin level 0.3-0.5 Monitor platelets by anticoagulation protocol: Yes   Plan:  Cont heparin at 1600 units/hr Coumadin 7.5mg  Daily INR , heparin level and CBC Watch for further signs of bleeding  Thanks for allowing pharmacy to be a part of this patient's care.  Excell Seltzer, PharmD Clinical Pharmacist, [phone]

## 2014-04-17 NOTE — Progress Notes (Signed)
Subjective:  No shortness of breath or chest pain. He evidently was having penile bleeding since being on anticoagulation but did not tell anybody and his wife let the nurse know about this yesterday. Still has some bleeding.  Objective:  Vital Signs in the last 24 hours: BP 136/68  Pulse 69  Temp(Src) 98.4 F (36.9 C) (Oral)  Resp 20  Ht 6\' 1"  (1.854 m)  Wt 110.1 kg (242 lb 11.6 oz)  BMI 32.03 kg/m2  SpO2 98%  Physical Exam: Pleasant white male in no acute distress Lungs:  Clear  Cardiac:  Regular rhythm, normal S1 and S2, no S3 Extremities:  No edema present  Intake/Output from previous day: 10/24 0701 - 10/25 0700 In: 440 [P.O.:440] Out: -  Weight Filed Weights   04/14/14 0032 04/14/14 2352 04/17/14 0436  Weight: 110 kg (242 lb 8.1 oz) 109.1 kg (240 lb 8.4 oz) 110.1 kg (242 lb 11.6 oz)    Lab Results: Basic Metabolic Panel: No results found for this basename: NA, K, CL, CO2, GLUCOSE, BUN, CREATININE,  in the last 72 hours  CBC:  Recent Labs  04/16/14 0239 04/17/14 0338  WBC 4.7 4.3  HGB 12.3* 12.3*  HCT 36.7* 36.1*  MCV 96.1 94.8  PLT 158 166    BNP    Component Value Date/Time   PROBNP 31.0 03/30/2012 1245    PROTIME: Lab Results  Component Value Date   INR 1.15 04/17/2014   INR 1.14 04/16/2014   INR 1.11 04/12/2014    Telemetry: Sinus rhythm  Assessment/Plan:  1. Acute pulmonary emboli currently being anticoagulated 2. Anticoagulation currently intravenous heparin being transitioned to warfarin 3. Urinary and penile bleeding on anticoagulation  Recommendations:  He has a prior history of prostate cancer treated with radiation and has a urologist. I would go ahead and get the urologist to see him today.      Kerry Hough  MD [facility] Cardiology  04/17/2014, 9:05 AM

## 2014-04-17 NOTE — Discharge Instructions (Signed)
Information on my medicine - Coumadin   (Warfarin)  This medication education was reviewed with me or my healthcare representative as part of my discharge preparation.  The pharmacist that spoke with me during my hospital stay was:  Beverlee Nims, [facility]  Why was Coumadin prescribed for you? Coumadin was prescribed for you because you have a blood clot or a medical condition that can cause an increased risk of forming blood clots. Blood clots can cause serious health problems by blocking the flow of blood to the heart, lung, or brain. Coumadin can prevent harmful blood clots from forming. As a reminder your indication for Coumadin is:   Pulmonary Embolism Treatment  What test will check on my response to Coumadin? While on Coumadin (warfarin) you will need to have an INR test regularly to ensure that your dose is keeping you in the desired range. The INR (international normalized ratio) number is calculated from the result of the laboratory test called prothrombin time (PT).  If an INR APPOINTMENT HAS NOT ALREADY BEEN MADE FOR YOU please schedule an appointment to have this lab work done by your health care provider within 7 days. Your INR goal is usually a number between:  2 to 3 or your provider may give you a more narrow range like 2-2.5.  Ask your health care provider during an office visit what your goal INR is.  What  do you need to  know  About  COUMADIN? Take Coumadin (warfarin) exactly as prescribed by your healthcare provider about the same time each day.  DO NOT stop taking without talking to the doctor who prescribed the medication.  Stopping without other blood clot prevention medication to take the place of Coumadin may increase your risk of developing a new clot or stroke.  Get refills before you run out.  What do you do if you miss a dose? If you miss a dose, take it as soon as you remember on the same day then continue your regularly scheduled regimen the next day.  Do not take  two doses of Coumadin at the same time.  Important Safety Information A possible side effect of Coumadin (Warfarin) is an increased risk of bleeding. You should call your healthcare provider right away if you experience any of the following:   Bleeding from an injury or your nose that does not stop.   Unusual colored urine (red or dark brown) or unusual colored stools (red or black).   Unusual bruising for unknown reasons.   A serious fall or if you hit your head (even if there is no bleeding).  Some foods or medicines interact with Coumadin (warfarin) and might alter your response to warfarin. To help avoid this:   Eat a balanced diet, maintaining a consistent amount of Vitamin K.   Notify your provider about major diet changes you plan to make.   Avoid alcohol or limit your intake to 1 drink for women and 2 drinks for men per day. (1 drink is 5 oz. wine, 12 oz. beer, or 1.5 oz. liquor.)  Make sure that ANY health care provider who prescribes medication for you knows that you are taking Coumadin (warfarin).  Also make sure the healthcare provider who is monitoring your Coumadin knows when you have started a new medication including herbals and non-prescription products.  Coumadin (Warfarin)  Major Drug Interactions  Increased Warfarin Effect Decreased Warfarin Effect  Alcohol (large quantities) Antibiotics (esp. Septra/Bactrim, Flagyl, Cipro) Amiodarone (Cordarone) Aspirin (ASA) Cimetidine (Tagamet)  Megestrol (Megace) °NSAIDs (ibuprofen, naproxen, etc.) °Piroxicam (Feldene) °Propafenone (Rythmol SR) °Propranolol (Inderal) °Isoniazid (INH) °Posaconazole (Noxafil) Barbiturates (Phenobarbital) °Carbamazepine (Tegretol) °Chlordiazepoxide (Librium) °Cholestyramine (Questran) °Griseofulvin °Oral Contraceptives °Rifampin °Sucralfate (Carafate) °Vitamin K  ° °Coumadin® (Warfarin) Major Herbal Interactions  °Increased Warfarin Effect Decreased Warfarin Effect  °Garlic °Ginseng °Ginkgo biloba  Coenzyme Q10 °Green tea °St. John’s wort   ° °Coumadin® (Warfarin) FOOD Interactions  °Eat a consistent number of servings per week of foods HIGH in Vitamin K °(1 serving = ½ cup)  °Collards (cooked, or boiled & drained) °Kale (cooked, or boiled & drained) °Mustard greens (cooked, or boiled & drained) °Parsley *serving size only = ¼ cup °Spinach (cooked, or boiled & drained) °Swiss chard (cooked, or boiled & drained) °Turnip greens (cooked, or boiled & drained)  °Eat a consistent number of servings per week of foods MEDIUM-HIGH in Vitamin K °(1 serving = 1 cup)  °Asparagus (cooked, or boiled & drained) °Broccoli (cooked, boiled & drained, or raw & chopped) °Brussel sprouts (cooked, or boiled & drained) *serving size only = ½ cup °Lettuce, raw (green leaf, endive, romaine) °Spinach, raw °Turnip greens, raw & chopped  ° °These websites have more information on Coumadin (warfarin):  www.coumadin.com; °www.ahrq.gov/consumer/coumadin.htm; ° ° °

## 2014-04-17 NOTE — Consult Note (Signed)
Urology Consult   Physician requesting consult: Dr. Tollie Eth  Reason for consult: Hematuria  History of Present Illness: [name] is a 73 y.o. with a history of prostate cancer followed by Dr. [name].  He has undergone curative therapy with radiation.  He underwent an evaluation for microscopic hematuria about 6 months ago including CT imaging and cystoscopy which were unremarkable except for radiation changes in the prostate.  He presented to [facility] with SOB and was diagnosed with a pulmonary embolus.  This is the 2nd pulmonary embolus he has had.  He has been anticoagulated with heparin and is now being transitioned to oral anticoagulation.  He noted some blood per urethra the last 48 hours which has now improved.  He is voiding well and his voided urine is grossly clear. He denies any dysuria, abdominal/flank pain, or fever.    Past Medical History  Diagnosis Date  . Gout   . GERD (gastroesophageal reflux disease)   . Barrett's esophagus 2009  . FH: colonic polyps   . Arthritis   . Hypertension   . Prostate cancer 12/2011    Found on routine screening PSA, planning for implants by Dr. Sondra Come 10/16   . Pulmonary embolus 2005    Bilateral, coumadin x 1 years, inpatient for 5 days  . Subdural hematoma   . Infected sebaceous cyst back 7x5x4cm s/p I&D [id number] 01/11/2013  . History of radiation therapy 06/22/2012-08/18/2012    78 Gy to prostate  . AAA (abdominal aortic aneurysm)   . CAD (coronary artery disease)     Noted to have coronary calcification on CT  . Shortness of breath   . Pulmonary HTN 04/12/2014    Past Surgical History  Procedure Laterality Date  . Fracture left femur repair  09/2010    Went to [facility] x 3.5 weeks  . Tonsillectomy      as child.  . Craniotomy  04/03/2012    Procedure: CRANIOTOMY HEMATOMA EVACUATION SUBDURAL;  Surgeon: Erline Levine, MD;  Location: [place] [facility];  Service: Neurosurgery;  Laterality: Left;  Left Craniotomy for  Evacuation of Subdural Hematoma  . Colonoscopy    . Polypectomy    . Abcessed cyst removed from back  01-11-13  . Cardiac catheterization  04/12/2014    Medications:  Home meds:    Medication List         allopurinol 300 MG tablet  Commonly known as:  ZYLOPRIM  Take 300 mg by mouth daily.     aspirin EC 81 MG tablet  Take 1 tablet (81 mg total) by mouth daily.     omeprazole 20 MG capsule  Commonly known as:  PRILOSEC  Take 20 mg by mouth daily.        Scheduled Meds: . allopurinol  300 mg Oral Daily  . fentaNYL  12.5-25 mcg Intravenous Once  . pantoprazole  40 mg Oral Daily  . warfarin  7.5 mg Oral ONCE-1800  . Warfarin - Pharmacist Dosing Inpatient   Does not apply q1800   Continuous Infusions: . heparin 1,600 Units/hr (04/16/14 2013)   PRN Meds:.  Allergies: No Known Allergies  Family History  Problem Relation Age of Onset  . Heart disease      No family history  . Macular degeneration Mother   . Osteoarthritis Mother   . Aneurysm Father     d/o 24 yo, ruptured AAA  . Colon cancer Neg Hx     Social History:  reports that he quit  smoking about 42 years ago. His smoking use included Cigarettes. He has a 20 pack-year smoking history. He has never used smokeless tobacco. He reports that he drinks about 3.6 ounces of alcohol per week. He reports that he does not use illicit drugs.  ROS: A complete review of systems was performed.  All systems are negative except for pertinent findings as noted.  Physical Exam:  Vital signs in last 24 hours: Temp:  [98.2 F (36.8 C)-98.4 F (36.9 C)] 98.4 F (36.9 C) (10/25 0436) Pulse Rate:  [69-80] 69 (10/25 0436) Resp:  [20] 20 (10/25 0436) BP: (131-139)/(68-75) 136/68 mmHg (10/25 0436) SpO2:  [97 %-100 %] 98 % (10/25 0436) Weight:  [110.1 kg (242 lb 11.6 oz)] 110.1 kg (242 lb 11.6 oz) (10/25 0436) Constitutional:  Alert and oriented, No acute distress Cardiovascular: Regular rate and rhythm, No JVD Respiratory:  Normal respiratory effort GI: Abdomen is soft, nontender, nondistended, no abdominal masses Genitourinary: No CVAT. Normal male phallus, testes are descended bilaterally and non-tender and without masses, scrotum is normal in appearance without lesions or masses, perineum is normal on inspection. There is evidence of a very small dry blood at the urethral meatus which is mildly hypospadiac. Lymphatic: No lymphadenopathy Neurologic: Grossly intact, no focal deficits Psychiatric: Normal mood and affect  Laboratory Data:   Recent Labs  04/15/14 0327 04/16/14 0239 04/17/14 0338  WBC 4.6 4.7 4.3  HGB 12.4* 12.3* 12.3*  HCT 36.7* 36.7* 36.1*  PLT 146* 158 166    No results found for this basename: NA, K, CL, CO3, GLUCOSE, BUN, CALCIUM, CREATININE,  in the last 72 hours   Results for orders placed during the hospital encounter of 04/12/14 (from the past 24 hour(s))  HEPARIN LEVEL (UNFRACTIONATED)     Status: None   Collection Time    04/17/14  3:38 AM      Result Value Ref Range   Heparin Unfractionated 0.45  0.30 - 0.70 IU/mL  CBC     Status: Abnormal   Collection Time    04/17/14  3:38 AM      Result Value Ref Range   WBC 4.3  4.0 - 10.5 K/uL   RBC 3.81 (*) 4.22 - 5.81 MIL/uL   Hemoglobin 12.3 (*) 13.0 - 17.0 g/dL   HCT 36.1 (*) 39.0 - 52.0 %   MCV 94.8  78.0 - 100.0 fL   MCH 32.3  26.0 - 34.0 pg   MCHC 34.1  30.0 - 36.0 g/dL   RDW 13.0  11.5 - 15.5 %   Platelets 166  150 - 400 K/uL  PROTIME-INR     Status: None   Collection Time    04/17/14  3:38 AM      Result Value Ref Range   Prothrombin Time 14.9  11.6 - 15.2 seconds   INR 1.15  0.00 - 1.49   Recent Results (from the past 240 hour(s))  URINE CULTURE     Status: None   Collection Time    04/14/14  2:00 PM      Result Value Ref Range Status   Specimen Description URINE, CLEAN CATCH   Final   Special Requests Normal   Final   Culture  Setup Time     Final   Value: 04/15/2014 14:20     Performed at [facility]     Final   Value: NO GROWTH     Performed at [facility]  Final   Value: NO GROWTH     Performed at [facility]   Report Status 04/16/2014 FINAL   Final    Renal Function:  Recent Labs  04/11/14 0935 04/12/14 0622  CREATININE 1.17 1.05   Estimated Creatinine Clearance: 80.3 ml/min (by C-G formula based on Cr of 1.05).   Impression/Recommendation Urethral bleeding: This is minimal and appears to be resolving.  I would recommend continued anticoagulation to a therapeutic level without concern about the urethral bleeding at this time.  He has no indication for catheter placement as long as he continues voiding well.  Since he underwent a full urologic hematuria evaluation recently, he likely will not need a repeat evaluation.  However, he already has follow up with Dr. Jeffie Pollock scheduled routinely for 1-2 weeks and should keep that appointment as scheduled.  [name],LES 04/17/2014, 12:50 PM    Pryor Curia MD  CC: Dr. Tollie Eth

## 2014-04-18 DIAGNOSIS — I2510 Atherosclerotic heart disease of native coronary artery without angina pectoris: Secondary | ICD-10-CM

## 2014-04-18 DIAGNOSIS — I2584 Coronary atherosclerosis due to calcified coronary lesion: Secondary | ICD-10-CM

## 2014-04-18 DIAGNOSIS — I10 Essential (primary) hypertension: Secondary | ICD-10-CM

## 2014-04-18 LAB — CBC
HEMATOCRIT: 36.3 % — AB (ref 39.0–52.0)
Hemoglobin: 12.5 g/dL — ABNORMAL LOW (ref 13.0–17.0)
MCH: 32.6 pg (ref 26.0–34.0)
MCHC: 34.4 g/dL (ref 30.0–36.0)
MCV: 94.5 fL (ref 78.0–100.0)
PLATELETS: 181 10*3/uL (ref 150–400)
RBC: 3.84 MIL/uL — AB (ref 4.22–5.81)
RDW: 13.1 % (ref 11.5–15.5)
WBC: 4.9 10*3/uL (ref 4.0–10.5)

## 2014-04-18 LAB — PROTIME-INR
INR: 1.15 (ref 0.00–1.49)
Prothrombin Time: 14.9 seconds (ref 11.6–15.2)

## 2014-04-18 LAB — HEPARIN LEVEL (UNFRACTIONATED): Heparin Unfractionated: 0.4 IU/mL (ref 0.30–0.70)

## 2014-04-18 MED ORDER — WARFARIN SODIUM 10 MG PO TABS
10.0000 mg | ORAL_TABLET | Freq: Once | ORAL | Status: AC
  Administered 2014-04-18: 10 mg via ORAL
  Filled 2014-04-18: qty 1

## 2014-04-18 NOTE — Progress Notes (Signed)
[place] for Coumadin, Heparin Indication: pulmonary embolus and DVT  No Known Allergies  Patient Measurements: Height: 6\' 1"  (185.4 cm) Weight: 240 lb 1.3 oz (108.9 kg) IBW/kg (Calculated) : 79.9   Vital Signs: Temp: 98.4 F (36.9 C) (10/26 0554) Temp Source: Oral (10/26 0554) BP: 140/85 mmHg (10/26 0554) Pulse Rate: 86 (10/26 0554)  Labs:  Recent Labs  04/16/14 0239 04/17/14 0338 04/18/14 0340  HGB 12.3* 12.3* 12.5*  HCT 36.7* 36.1* 36.3*  PLT 158 166 181  LABPROT 14.7 14.9 14.9  INR 1.14 1.15 1.15  HEPARINUNFRC 0.37 0.45 0.40    Estimated Creatinine Clearance: 79.9 ml/min (by C-G formula based on Cr of 1.05).   Medical History: Past Medical History  Diagnosis Date  . Gout   . GERD (gastroesophageal reflux disease)   . Barrett's esophagus 2009  . FH: colonic polyps   . Arthritis   . Hypertension   . Prostate cancer 12/2011    Found on routine screening PSA, planning for implants by Dr. Sondra Come 10/16   . Pulmonary embolus 2005    Bilateral, coumadin x 1 years, inpatient for 5 days  . Subdural hematoma   . Infected sebaceous cyst back 7x5x4cm s/p I&D [id number] 01/11/2013  . History of radiation therapy 06/22/2012-08/18/2012    78 Gy to prostate  . AAA (abdominal aortic aneurysm)   . CAD (coronary artery disease)     Noted to have coronary calcification on CT  . Shortness of breath   . Pulmonary HTN 04/12/2014    Medications:  Scheduled:  . allopurinol  300 mg Oral Daily  . fentaNYL  12.5-25 mcg Intravenous Once  . pantoprazole  40 mg Oral Daily  . Warfarin - Pharmacist Dosing Inpatient   Does not apply q1800    Assessment: 73yo male with DVT and PE, (+) hx of SDH resolved as of Oct '13 and anticoagulation OK'd by Neurosurgery.  During this admission pt had (+)hematuria and urethral bleeding which is now resolved-pt was also cleared by Urology. Hg stable and plts remain stable, heparin level was therapeutic this  morning.  INR remains unchanged after three doses, at 1.15.  Goal of Therapy:  INR 2-3 and Heparin level 0.3-0.5 Monitor platelets by anticoagulation protocol: Yes   Plan:  -Warfarin 10 mg po x1 -Continue heparin gtt at 1600 units/hr -Daily INR , heparin level and CBC -Watch for further signs of bleeding -Dc heparin gtt when INR > 2 for at least 24 hours    Hughes Better, PharmD, BCPS Clinical Pharmacist Pager: [phone] 04/18/2014 8:35 AM

## 2014-04-18 NOTE — Progress Notes (Signed)
SUBJECTIVE:  No complaints today  OBJECTIVE:   Vitals:   Filed Vitals:   04/17/14 1925 04/17/14 2022 04/18/14 0554 04/18/14 0700  BP: 141/69  140/85   Pulse: 95  86   Temp: 98.6 F (37 C)  98.4 F (36.9 C)   TempSrc: Oral  Oral   Resp: 20  18   Height:      Weight:   240 lb 1.3 oz (108.9 kg)   SpO2: 99% 94% 90% 94%   I&O's:   Intake/Output Summary (Last 24 hours) at 04/18/14 0834 Last data filed at 04/18/14 0555  Gross per 24 hour  Intake      0 ml  Output    975 ml  Net   -975 ml   TELEMETRY: Reviewed telemetry pt in NSR:     PHYSICAL EXAM General: Well developed, well nourished, in no acute distress Head: Eyes PERRLA, No xanthomas.   Normal cephalic and atramatic  Lungs:   Clear bilaterally to auscultation and percussion. Heart:   HRRR S1 S2 Pulses are 2+ & equal. Abdomen: Bowel sounds are positive, abdomen soft and non-tender without masses Extremities:   No clubbing, cyanosis or edema.  DP +1 Neuro: Alert and oriented X 3. Psych:  Good affect, responds appropriately   LABS: Basic Metabolic Panel: No results found for this basename: NA, K, CL, CO2, GLUCOSE, BUN, CREATININE, CALCIUM, MG, PHOS,  in the last 72 hours Liver Function Tests: No results found for this basename: AST, ALT, ALKPHOS, BILITOT, PROT, ALBUMIN,  in the last 72 hours No results found for this basename: LIPASE, AMYLASE,  in the last 72 hours CBC:  Recent Labs  04/17/14 0338 04/18/14 0340  WBC 4.3 4.9  HGB 12.3* 12.5*  HCT 36.1* 36.3*  MCV 94.8 94.5  PLT 166 181   Cardiac Enzymes: No results found for this basename: CKTOTAL, CKMB, CKMBINDEX, TROPONINI,  in the last 72 hours BNP: No components found with this basename: POCBNP,  D-Dimer: No results found for this basename: DDIMER,  in the last 72 hours Hemoglobin A1C: No results found for this basename: HGBA1C,  in the last 72 hours Fasting Lipid Panel: No results found for this basename: CHOL, HDL, LDLCALC, TRIG, CHOLHDL,  LDLDIRECT,  in the last 72 hours Thyroid Function Tests: No results found for this basename: TSH, T4TOTAL, FREET3, T3FREE, THYROIDAB,  in the last 72 hours Anemia Panel: No results found for this basename: VITAMINB12, FOLATE, FERRITIN, TIBC, IRON, RETICCTPCT,  in the last 72 hours Coag Panel:   Lab Results  Component Value Date   INR 1.15 04/18/2014   INR 1.15 04/17/2014   INR 1.14 04/16/2014    RADIOLOGY: Ct Angio Chest Pe W/cm &/or Wo Cm  04/12/2014   ADDENDUM REPORT: 04/12/2014 15:45  ADDENDUM: These results were called by telephone at the time of interpretation on 04/12/2014 at 3:10 pm to Dr. Elsworth Soho, who verbally acknowledged these results.   Electronically Signed   By: Julian Hy M.D.   On: 04/12/2014 15:45   04/12/2014   CLINICAL DATA:  Shortness of breath, elevated D-dimer, cardiac catheterization, history of PE, Coumadin  EXAM: CT ANGIOGRAPHY CHEST WITH CONTRAST  TECHNIQUE: Multidetector CT imaging of the chest was performed using the standard protocol during bolus administration of intravenous contrast. Multiplanar CT image reconstructions and MIPs were obtained to evaluate the vascular anatomy.  CONTRAST:  34mL OMNIPAQUE IOHEXOL 350 MG/ML SOLN  COMPARISON:  CT chest dated 04/01/2012  FINDINGS: Lobar pulmonary emboli at the  bifurcation of the right main pulmonary artery as well as the takeoff of the left upper lobe pulmonary artery. Additional segmental pulmonary emboli within the left lower lobe.  Overall clot burden is moderate to large.  Elevated RV-to-LV ratio (1.32), raising concern for right heart strain.  Evaluation of the lung parenchyma is constrained by respiratory motion. Mild dependent atelectasis in the bilateral lower lobes. Suspect mild emphysematous changes. No suspicious pulmonary nodules. No pleural effusion or pneumothorax.  Visualized thyroid is unremarkable.  Cardiomegaly. No pericardial effusion. Coronary atherosclerosis. Atherosclerotic calcifications of the  aortic arch. Ectasia of the ascending thoracic aorta, measuring 3.9 cm (series 401/image 70).  No suspicious mediastinal, hilar, or axillary lymphadenopathy.  Visualized upper abdomen is notable for vascular calcifications.  Degenerative changes of the thoracolumbar spine. Fracture involving the anterior/superior endplate of the X51 vertebral body (sagittal image 112), likely subacute but new from 10/20/2013, with mild loss of height.  Review of the MIP images confirms the above findings.  IMPRESSION: Bilateral lobar pulmonary emboli, as above. Overall clot burden is moderate to large.  Positive for acute PE with CT evidence of right heart strain (RV/LV Ratio = 1.3), consistent with at least submassive (intermediate risk) PE. The presence of right heart strain has been associated with an increased risk of morbidity and mortality. Consultation with Pulmonary and Critical Care Medicine is recommended.  Subacute fracture involving the anterior/ superior endplate of the Z00 vertebral body, as above.  Electronically Signed: By: Julian Hy M.D. On: 04/12/2014 14:49   Assessment/Plan:  1. Acute sub massive bilateral pulmonary emboli with history of PE remotely -  currently being anticoagulated  2. Anticoagulation currently intravenous heparin being transitioned to warfarin  3. Urinary and penile bleeding on anticoagulation  4.  HTN - controlled 5.  CAD with 80% D2 by cath, 30% mid LAD and RCA with normal LVF 6.  Pulmonary HTN by cath - PASP 23mmHg 7.  Prostate CA 8.  H/O subdural hematoma 03/2012 therefore will avoid NOAC 9.  AAA 3cm x 3cm by [place] 03/2013  Recommendations: 1.  Appreciate Urology recs. - will followup as outpt in office 2.  Continue IV Heparin while loading Coumadin - He will need at least a 48 hour overlap after INR greater than 2.     Sueanne Margarita, MD  04/18/2014  8:34 AM

## 2014-04-19 DIAGNOSIS — I82402 Acute embolism and thrombosis of unspecified deep veins of left lower extremity: Secondary | ICD-10-CM

## 2014-04-19 DIAGNOSIS — I824Z9 Acute embolism and thrombosis of unspecified deep veins of unspecified distal lower extremity: Secondary | ICD-10-CM

## 2014-04-19 LAB — CBC
HEMATOCRIT: 37.2 % — AB (ref 39.0–52.0)
Hemoglobin: 12.3 g/dL — ABNORMAL LOW (ref 13.0–17.0)
MCH: 32.2 pg (ref 26.0–34.0)
MCHC: 33.1 g/dL (ref 30.0–36.0)
MCV: 97.4 fL (ref 78.0–100.0)
PLATELETS: 171 10*3/uL (ref 150–400)
RBC: 3.82 MIL/uL — AB (ref 4.22–5.81)
RDW: 13.3 % (ref 11.5–15.5)
WBC: 4.5 10*3/uL (ref 4.0–10.5)

## 2014-04-19 LAB — BASIC METABOLIC PANEL
Anion gap: 14 (ref 5–15)
BUN: 15 mg/dL (ref 6–23)
CHLORIDE: 104 meq/L (ref 96–112)
CO2: 23 meq/L (ref 19–32)
Calcium: 8.4 mg/dL (ref 8.4–10.5)
Creatinine, Ser: 0.73 mg/dL (ref 0.50–1.35)
GFR calc Af Amer: >90 mL/min (ref >90)
GFR calc non Af Amer: 89 mL/min — ABNORMAL LOW (ref >90)
Glucose, Bld: 101 mg/dL — ABNORMAL HIGH (ref 70–99)
Potassium: 4.4 mEq/L (ref 3.7–5.3)
SODIUM: 141 meq/L (ref 137–147)

## 2014-04-19 LAB — PROTIME-INR
INR: 1.13 (ref 0.00–1.49)
Prothrombin Time: 14.6 seconds (ref 11.6–15.2)

## 2014-04-19 LAB — HEPARIN LEVEL (UNFRACTIONATED): HEPARIN UNFRACTIONATED: 0.33 [IU]/mL (ref 0.30–0.70)

## 2014-04-19 MED ORDER — WARFARIN SODIUM 10 MG PO TABS
10.0000 mg | ORAL_TABLET | Freq: Once | ORAL | Status: AC
  Administered 2014-04-19: 10 mg via ORAL
  Filled 2014-04-19: qty 1

## 2014-04-19 NOTE — Progress Notes (Signed)
ANTICOAGULATION CONSULT NOTE - Follow Up Consult  Pharmacy Consult for heparin and Coumadin Indication: pulmonary embolus and DVT  No Known Allergies  Patient Measurements: Height: 6\' 1"  (185.4 cm) Weight: 240 lb 1.3 oz (108.9 kg) IBW/kg (Calculated) : 79.9 Heparin Dosing Weight:   Vital Signs: Temp: 97.8 F (36.6 C) (10/27 0700) Temp Source: Oral (10/27 0700) BP: 139/78 mmHg (10/27 0700) Pulse Rate: 72 (10/27 0700)  Labs:  Recent Labs  04/17/14 0338 04/18/14 0340 04/19/14 0510  HGB 12.3* 12.5* 12.3*  HCT 36.1* 36.3* 37.2*  PLT 166 181 171  LABPROT 14.9 14.9 14.6  INR 1.15 1.15 1.13  HEPARINUNFRC 0.45 0.40 0.33  CREATININE  --   --  0.73    Estimated Creatinine Clearance: 104.8 ml/min (by C-G formula based on Cr of 0.73).   Medications:  Scheduled:  . allopurinol  300 mg Oral Daily  . fentaNYL  12.5-25 mcg Intravenous Once  . pantoprazole  40 mg Oral Daily  . Warfarin - Pharmacist Dosing Inpatient   Does not apply q1800    Assessment: 73yo male with PE and DVT.  Plan is for a 48hr crossover with INR > 2.  INR has not been moving, 1.13 this AM and Heparin level 0.33 on 1600 units/hr.  No bleeding noted.  Hg has been very stable and pltc are wnl.  Goal of Therapy:  INR 2-3 Heparin level 0.3-0.5 units/ml Monitor platelets by anticoagulation protocol: Yes   Plan:  Coumadin 10mg  today Continue Heparin 1600 units/hr F/U in AM  Gracy Bruins, PharmD [facility]

## 2014-04-19 NOTE — Progress Notes (Signed)
SUBJECTIVE:  No complaints  OBJECTIVE:   Vitals:   Filed Vitals:   04/18/14 0700 04/18/14 1319 04/18/14 1954 04/19/14 0700  BP:  141/61 140/69 139/78  Pulse:  93 92 72  Temp:  97.8 F (36.6 C) 97.8 F (36.6 C) 97.8 F (36.6 C)  TempSrc:  Oral Oral Oral  Resp:  18 18 18   Height:      Weight:      SpO2: 94% 95% 94% 91%   I&O's:   Intake/Output Summary (Last 24 hours) at 04/19/14 0754 Last data filed at 04/19/14 0538  Gross per 24 hour  Intake    360 ml  Output   1000 ml  Net   -640 ml   TELEMETRY: Reviewed telemetry pt in NSR:     PHYSICAL EXAM General: Well developed, well nourished, in no acute distress Head: Eyes PERRLA, No xanthomas.   Normal cephalic and atramatic  Lungs:   Clear bilaterally to auscultation and percussion. Heart:   HRRR S1 S2 Pulses are 2+ & equal. Abdomen: Bowel sounds are positive, abdomen soft and non-tender without masses Extremities:   No clubbing, cyanosis or edema.  DP +1 Neuro: Alert and oriented X 3. Psych:  Good affect, responds appropriately   LABS: Basic Metabolic Panel: No results found for this basename: NA, K, CL, CO2, GLUCOSE, BUN, CREATININE, CALCIUM, MG, PHOS,  in the last 72 hours Liver Function Tests: No results found for this basename: AST, ALT, ALKPHOS, BILITOT, PROT, ALBUMIN,  in the last 72 hours No results found for this basename: LIPASE, AMYLASE,  in the last 72 hours CBC:  Recent Labs  04/18/14 0340 04/19/14 0510  WBC 4.9 4.5  HGB 12.5* 12.3*  HCT 36.3* 37.2*  MCV 94.5 97.4  PLT 181 171   Cardiac Enzymes: No results found for this basename: CKTOTAL, CKMB, CKMBINDEX, TROPONINI,  in the last 72 hours BNP: No components found with this basename: POCBNP,  D-Dimer: No results found for this basename: DDIMER,  in the last 72 hours Hemoglobin A1C: No results found for this basename: HGBA1C,  in the last 72 hours Fasting Lipid Panel: No results found for this basename: CHOL, HDL, LDLCALC, TRIG, CHOLHDL,  LDLDIRECT,  in the last 72 hours Thyroid Function Tests: No results found for this basename: TSH, T4TOTAL, FREET3, T3FREE, THYROIDAB,  in the last 72 hours Anemia Panel: No results found for this basename: VITAMINB12, FOLATE, FERRITIN, TIBC, IRON, RETICCTPCT,  in the last 72 hours Coag Panel:   Lab Results  Component Value Date   INR 1.13 04/19/2014   INR 1.15 04/18/2014   INR 1.15 04/17/2014    RADIOLOGY: Ct Angio Chest Pe W/cm &/or Wo Cm  04/12/2014   ADDENDUM REPORT: 04/12/2014 15:45  ADDENDUM: These results were called by telephone at the time of interpretation on 04/12/2014 at 3:10 pm to Dr. Elsworth Soho, who verbally acknowledged these results.   Electronically Signed   By: Julian Hy M.D.   On: 04/12/2014 15:45   04/12/2014   CLINICAL DATA:  Shortness of breath, elevated D-dimer, cardiac catheterization, history of PE, Coumadin  EXAM: CT ANGIOGRAPHY CHEST WITH CONTRAST  TECHNIQUE: Multidetector CT imaging of the chest was performed using the standard protocol during bolus administration of intravenous contrast. Multiplanar CT image reconstructions and MIPs were obtained to evaluate the vascular anatomy.  CONTRAST:  55mL OMNIPAQUE IOHEXOL 350 MG/ML SOLN  COMPARISON:  CT chest dated 04/01/2012  FINDINGS: Lobar pulmonary emboli at the bifurcation of the right main pulmonary  artery as well as the takeoff of the left upper lobe pulmonary artery. Additional segmental pulmonary emboli within the left lower lobe.  Overall clot burden is moderate to large.  Elevated RV-to-LV ratio (1.32), raising concern for right heart strain.  Evaluation of the lung parenchyma is constrained by respiratory motion. Mild dependent atelectasis in the bilateral lower lobes. Suspect mild emphysematous changes. No suspicious pulmonary nodules. No pleural effusion or pneumothorax.  Visualized thyroid is unremarkable.  Cardiomegaly. No pericardial effusion. Coronary atherosclerosis. Atherosclerotic calcifications of the  aortic arch. Ectasia of the ascending thoracic aorta, measuring 3.9 cm (series 401/image 70).  No suspicious mediastinal, hilar, or axillary lymphadenopathy.  Visualized upper abdomen is notable for vascular calcifications.  Degenerative changes of the thoracolumbar spine. Fracture involving the anterior/superior endplate of the Z20 vertebral body (sagittal image 112), likely subacute but new from 10/20/2013, with mild loss of height.  Review of the MIP images confirms the above findings.  IMPRESSION: Bilateral lobar pulmonary emboli, as above. Overall clot burden is moderate to large.  Positive for acute PE with CT evidence of right heart strain (RV/LV Ratio = 1.3), consistent with at least submassive (intermediate risk) PE. The presence of right heart strain has been associated with an increased risk of morbidity and mortality. Consultation with Pulmonary and Critical Care Medicine is recommended.  Subacute fracture involving the anterior/ superior endplate of the E02 vertebral body, as above.  Electronically Signed: By: Julian Hy M.D. On: 04/12/2014 14:49   Assessment/Plan:  1. Acute sub massive bilateral pulmonary emboli with history of PE remotely - currently being anticoagulated  2. Anticoagulation currently intravenous heparin being transitioned to warfarin  3. Urinary and penile bleeding on anticoagulation  4. HTN - controlled  5. CAD with 80% D2 by cath, 30% mid LAD and RCA with normal LVF - no anginal chest pain 6. Pulmonary HTN by cath - PASP 58mmHg  7. Prostate CA  8. H/O subdural hematoma 03/2012 therefore will avoid NOAC  9. AAA 3cm x 3cm by [place] 03/2013  10.  Acute LLE DVT  Recommendations:  1. Appreciate Urology recs. - will followup as outpt in office  2. Continue IV Heparin while loading Coumadin - He will need at least a 48 hour overlap after INR greater than 2.    Sueanne Margarita, MD  04/19/2014  7:54 AM

## 2014-04-20 ENCOUNTER — Encounter (HOSPITAL_COMMUNITY): Payer: Medicare Other

## 2014-04-20 ENCOUNTER — Ambulatory Visit (HOSPITAL_COMMUNITY): Payer: Medicare Other

## 2014-04-20 DIAGNOSIS — I824Z2 Acute embolism and thrombosis of unspecified deep veins of left distal lower extremity: Secondary | ICD-10-CM

## 2014-04-20 LAB — PROTIME-INR
INR: 1.21 (ref 0.00–1.49)
Prothrombin Time: 15.4 seconds — ABNORMAL HIGH (ref 11.6–15.2)

## 2014-04-20 LAB — HEPARIN LEVEL (UNFRACTIONATED)
HEPARIN UNFRACTIONATED: 0.28 [IU]/mL — AB (ref 0.30–0.70)
HEPARIN UNFRACTIONATED: 0.42 [IU]/mL (ref 0.30–0.70)

## 2014-04-20 MED ORDER — WARFARIN SODIUM 10 MG PO TABS
10.0000 mg | ORAL_TABLET | Freq: Once | ORAL | Status: AC
  Administered 2014-04-20: 10 mg via ORAL
  Filled 2014-04-20: qty 1

## 2014-04-20 NOTE — Progress Notes (Signed)
ANTICOAGULATION CONSULT NOTE - Follow Up Consult  Pharmacy Consult for Heparin/Coumadin Indication: DVT and PE  No Known Allergies  Patient Measurements: Height: 6\' 1"  (185.4 cm) Weight: 240 lb 1.3 oz (108.9 kg) IBW/kg (Calculated) : 79.9 Heparin Dosing Weight: 102.5  Vital Signs: Temp: 97.7 F (36.5 C) (10/28 0439) Temp Source: Oral (10/28 0439) BP: 138/74 mmHg (10/28 0439) Pulse Rate: 73 (10/28 0439)  Labs:  Recent Labs  04/18/14 0340 04/19/14 0510 04/20/14 0500 04/20/14 1415  HGB 12.5* 12.3*  --   --   HCT 36.3* 37.2*  --   --   PLT 181 171  --   --   LABPROT 14.9 14.6 15.4*  --   INR 1.15 1.13 1.21  --   HEPARINUNFRC 0.40 0.33 0.28* 0.42  CREATININE  --  0.73  --   --     Estimated Creatinine Clearance: 104.8 ml/min (by C-G formula based on Cr of 0.73).  Assessment: Admit Complaint: h/o PE in 2005 s/p 12 mo of warf and hx of prostate CA s/p radiation in 2013. Now admitted with dyspnea for L/R heart cath. Cath revealed severe PAH and CTA chest revealed bilateral lobar submassive PE with right heart strain (RV/LV ratio 1.3).   Anticoagulation: (Coum pts 6): new DVT, new PE. Bleeding from penis - resolved, urology aware. NS consult OK'd AC on 10/22; SDH resolved Oct '13, avoiding NOAC. Plan is for at least a 48hr overlap with INR > 2 per Dr Percival Spanish 10/23. Heparin level 0.42 now back in goal range.  --HL 0.28. INR 1.21. CBC stable. --10/28: Pt states urethral bleeding has decreased; less and less each day.   Infectious Disease: WBC wnl; afebrile. UCx d/t hematuria - no growth  Cardiovascular: hx of CAD, (+)AAA, pulm HTN, HTN. S/p Cath. VSS & wnl  Endo/GI: PPI-po, heart healthy diet  Nephrology: SCr 0.73  Pulm- RA  PTA Medication Issues: resumed  Best Practices: heparin, Coumadin  Goal of Therapy:  HL 0.3-0.5 INR 2-3 Monitor platelets by anticoagulation protocol: Yes   Plan:  -Heparin at 1700 units/hr (goal HL 0.3-0.5) -repeat Coumadin 10 mg x 1  today -Daily INR, HL, and CBC -Plan heparin/warfarin overlap for at least 48 hrs when INR > 2   Terence Bart S. Alford Highland, PharmD, BCPS Clinical Staff Pharmacist Pager [phone]  Eilene Ghazi Stillinger 04/20/2014,2:55 PM

## 2014-04-20 NOTE — Progress Notes (Signed)
Gave wife a handout on Vitamin K Foods and Warfarin.  Rowe Pavy, RN

## 2014-04-20 NOTE — Progress Notes (Addendum)
SUBJECTIVE:  No complaints  OBJECTIVE:   Vitals:   Filed Vitals:   04/19/14 0700 04/19/14 1433 04/19/14 1958 04/20/14 0439  BP: 139/78 135/74 130/62 138/74  Pulse: 72 75 91 73  Temp: 97.8 F (36.6 C) 98 F (36.7 C) 98.6 F (37 C) 97.7 F (36.5 C)  TempSrc: Oral Oral Oral Oral  Resp: 18 18 20 20   Height:      Weight:      SpO2: 91% 92% 92% 92%   I&O's:   Intake/Output Summary (Last 24 hours) at 04/20/14 6767 Last data filed at 04/20/14 0000  Gross per 24 hour  Intake    800 ml  Output      0 ml  Net    800 ml   TELEMETRY: Reviewed telemetry pt in NSR:     PHYSICAL EXAM General: Well developed, well nourished, in no acute distress Head: Eyes PERRLA, No xanthomas.   Normal cephalic and atramatic  Lungs:   Clear bilaterally to auscultation and percussion. Heart:   HRRR S1 S2 Pulses are 2+ & equal. Abdomen: Bowel sounds are positive, abdomen soft and non-tender without masses  Extremities:   1-2+ edema LLE Neuro: Alert and oriented X 3. Psych:  Good affect, responds appropriately   LABS: Basic Metabolic Panel:  Recent Labs  04/19/14 0510  NA 141  K 4.4  CL 104  CO2 23  GLUCOSE 101*  BUN 15  CREATININE 0.73  CALCIUM 8.4   Liver Function Tests: No results found for this basename: AST, ALT, ALKPHOS, BILITOT, PROT, ALBUMIN,  in the last 72 hours No results found for this basename: LIPASE, AMYLASE,  in the last 72 hours CBC:  Recent Labs  04/18/14 0340 04/19/14 0510  WBC 4.9 4.5  HGB 12.5* 12.3*  HCT 36.3* 37.2*  MCV 94.5 97.4  PLT 181 171   Cardiac Enzymes: No results found for this basename: CKTOTAL, CKMB, CKMBINDEX, TROPONINI,  in the last 72 hours BNP: No components found with this basename: POCBNP,  D-Dimer: No results found for this basename: DDIMER,  in the last 72 hours Hemoglobin A1C: No results found for this basename: HGBA1C,  in the last 72 hours Fasting Lipid Panel: No results found for this basename: CHOL, HDL, LDLCALC, TRIG,  CHOLHDL, LDLDIRECT,  in the last 72 hours Thyroid Function Tests: No results found for this basename: TSH, T4TOTAL, FREET3, T3FREE, THYROIDAB,  in the last 72 hours Anemia Panel: No results found for this basename: VITAMINB12, FOLATE, FERRITIN, TIBC, IRON, RETICCTPCT,  in the last 72 hours Coag Panel:   Lab Results  Component Value Date   INR 1.21 04/20/2014   INR 1.13 04/19/2014   INR 1.15 04/18/2014    RADIOLOGY: Ct Angio Chest Pe W/cm &/or Wo Cm  04/12/2014   ADDENDUM REPORT: 04/12/2014 15:45  ADDENDUM: These results were called by telephone at the time of interpretation on 04/12/2014 at 3:10 pm to Dr. Elsworth Soho, who verbally acknowledged these results.   Electronically Signed   By: Julian Hy M.D.   On: 04/12/2014 15:45   04/12/2014   CLINICAL DATA:  Shortness of breath, elevated D-dimer, cardiac catheterization, history of PE, Coumadin  EXAM: CT ANGIOGRAPHY CHEST WITH CONTRAST  TECHNIQUE: Multidetector CT imaging of the chest was performed using the standard protocol during bolus administration of intravenous contrast. Multiplanar CT image reconstructions and MIPs were obtained to evaluate the vascular anatomy.  CONTRAST:  6mL OMNIPAQUE IOHEXOL 350 MG/ML SOLN  COMPARISON:  CT chest dated 04/01/2012  FINDINGS: Lobar pulmonary emboli at the bifurcation of the right main pulmonary artery as well as the takeoff of the left upper lobe pulmonary artery. Additional segmental pulmonary emboli within the left lower lobe.  Overall clot burden is moderate to large.  Elevated RV-to-LV ratio (1.32), raising concern for right heart strain.  Evaluation of the lung parenchyma is constrained by respiratory motion. Mild dependent atelectasis in the bilateral lower lobes. Suspect mild emphysematous changes. No suspicious pulmonary nodules. No pleural effusion or pneumothorax.  Visualized thyroid is unremarkable.  Cardiomegaly. No pericardial effusion. Coronary atherosclerosis. Atherosclerotic calcifications of  the aortic arch. Ectasia of the ascending thoracic aorta, measuring 3.9 cm (series 401/image 70).  No suspicious mediastinal, hilar, or axillary lymphadenopathy.  Visualized upper abdomen is notable for vascular calcifications.  Degenerative changes of the thoracolumbar spine. Fracture involving the anterior/superior endplate of the O12 vertebral body (sagittal image 112), likely subacute but new from 10/20/2013, with mild loss of height.  Review of the MIP images confirms the above findings.  IMPRESSION: Bilateral lobar pulmonary emboli, as above. Overall clot burden is moderate to large.  Positive for acute PE with CT evidence of right heart strain (RV/LV Ratio = 1.3), consistent with at least submassive (intermediate risk) PE. The presence of right heart strain has been associated with an increased risk of morbidity and mortality. Consultation with Pulmonary and Critical Care Medicine is recommended.  Subacute fracture involving the anterior/ superior endplate of the Y48 vertebral body, as above.  Electronically Signed: By: Julian Hy M.D. On: 04/12/2014 14:49   Assessment/Plan:  1. Acute sub massive bilateral pulmonary emboli with history of PE remotely - currently being anticoagulated  2. Anticoagulation currently intravenous heparin being transitioned to warfarin  3. Urinary and penile bleeding on anticoagulation  4. HTN - controlled  5. CAD with 80% D2 by cath, 30% mid LAD and RCA with normal LVF - no anginal chest pain  6. Pulmonary HTN by cath - PASP 50mmHg  7. Prostate CA  8. H/O subdural hematoma 03/2012 therefore will avoid NOAC  9. AAA 3cm x 3cm by [place] 03/2013  10. Acute LLE DVT   Recommendations:  1. Appreciate Urology recs. - will followup as outpt in office  2. Continue IV Heparin while loading Coumadin - He will need at least a 48 hour overlap after INR greater than 2. INR slowly increasing.   Jeffery Margarita, MD  04/20/2014  7:02 AM

## 2014-04-20 NOTE — Progress Notes (Signed)
Pt states urethral bleeding has decreased; less and less each day.  Rowe Pavy, RN

## 2014-04-20 NOTE — Progress Notes (Signed)
ANTICOAGULATION CONSULT NOTE - Follow Up Consult  Pharmacy Consult for Heparin  Indication: pulmonary embolus and DVT  No Known Allergies  Patient Measurements: Height: 6\' 1"  (185.4 cm) Weight: 240 lb 1.3 oz (108.9 kg) IBW/kg (Calculated) : 79.9  Vital Signs: Temp: 97.7 F (36.5 C) (10/28 0439) Temp Source: Oral (10/28 0439) BP: 138/74 mmHg (10/28 0439) Pulse Rate: 73 (10/28 0439)  Labs:  Recent Labs  04/18/14 0340 04/19/14 0510 04/20/14 0500  HGB 12.5* 12.3*  --   HCT 36.3* 37.2*  --   PLT 181 171  --   LABPROT 14.9 14.6 15.4*  INR 1.15 1.13 1.21  HEPARINUNFRC 0.40 0.33 0.28*  CREATININE  --  0.73  --     Estimated Creatinine Clearance: 104.8 ml/min (by C-G formula based on Cr of 0.73).   Assessment: 73 y/o M with PE/DVT. HL is slightly low at 0.28 (goal 0.3-0.5). Other labs as above. INR still not moving much.   Goal of Therapy:  Heparin level 0.3-0.5 units/ml Monitor platelets by anticoagulation protocol: Yes   Plan:  -Increase heparin slightly 1700 units/hr -1400 HL -Daily CBC/HL -Monitor for bleeding  Narda Bonds 04/20/2014,6:28 AM

## 2014-04-21 ENCOUNTER — Ambulatory Visit (HOSPITAL_COMMUNITY): Payer: Medicare Other

## 2014-04-21 LAB — PROTIME-INR
INR: 1.35 (ref 0.00–1.49)
Prothrombin Time: 16.8 seconds — ABNORMAL HIGH (ref 11.6–15.2)

## 2014-04-21 LAB — HEPARIN LEVEL (UNFRACTIONATED): Heparin Unfractionated: 0.38 IU/mL (ref 0.30–0.70)

## 2014-04-21 MED ORDER — WARFARIN SODIUM 10 MG PO TABS
12.5000 mg | ORAL_TABLET | Freq: Once | ORAL | Status: AC
  Administered 2014-04-21: 12.5 mg via ORAL
  Filled 2014-04-21: qty 1

## 2014-04-21 NOTE — Progress Notes (Signed)
ANTICOAGULATION CONSULT NOTE - Follow Up Consult  Pharmacy Consult for Heparin/Coumadin Indication: DVT and PE  No Known Allergies  Patient Measurements: Height: 6\' 1"  (185.4 cm) Weight: 240 lb 1.3 oz (108.9 kg) IBW/kg (Calculated) : 79.9 Heparin Dosing Weight: 102.5  Vital Signs: Temp: 98.4 F (36.9 C) (10/29 0430) Temp Source: Oral (10/29 0430) BP: 134/70 mmHg (10/29 0430) Pulse Rate: 75 (10/29 0430)  Labs:  Recent Labs  04/19/14 0510 04/20/14 0500 04/20/14 1415 04/21/14 0335  HGB 12.3*  --   --   --   HCT 37.2*  --   --   --   PLT 171  --   --   --   LABPROT 14.6 15.4*  --  16.8*  INR 1.13 1.21  --  1.35  HEPARINUNFRC 0.33 0.28* 0.42 0.38  CREATININE 0.73  --   --   --     Estimated Creatinine Clearance: 104.8 ml/min (by C-G formula based on Cr of 0.73).  Assessment: Admit Complaint: h/o PE in 2005 s/p 12 mo of warf and hx of prostate CA s/p radiation in 2013. Now admitted with dyspnea for L/R heart cath. Cath revealed severe PAH and CTA chest revealed bilateral lobar submassive PE with right heart strain (RV/LV ratio 1.3).   Anticoagulation: (Coum pts 6): new DVT, new PE. Bleeding from penis - resolved, urology aware. NS consult OK'd AC on 10/22; SDH resolved Oct '13, avoiding NOAC. Plan is for at least a 48hr overlap with INR > 2 per Dr Percival Spanish 10/23. Heparin level 0.38 now back in goal range. INR 1.35. CBC was discontinued. Will reorder for tomorrow. --10/28: Pt states urethral bleeding has decreased; less and less each day.   Infectious Disease: WBC wnl; afebrile. UCx d/t hematuria negative.  Cardiovascular: hx of CAD, (+)AAA, pulm HTN, HTN. S/p Cath. VSS & wnl  Endo/GI: PPI-po, heart healthy diet, allopurinol  Nephrology: SCr 0.73  Pulm- RA  PTA Medication Issues: resumed  Best Practices: heparin, Coumadin  Goal of Therapy:  HL 0.3-0.5 INR 2-3 Monitor platelets by anticoagulation protocol: Yes   Plan:  -Heparin at 1700 units/hr (goal HL  0.3-0.5) -Coumadin 12.5 mg x 1 today -Daily INR, HL, and CBC -Plan heparin/warfarin overlap for at least 48 hrs when INR > 2   Gurfateh Mcclain S. Alford Highland, PharmD, [facility] Clinical Staff Pharmacist Pager [phone]  Eilene Ghazi Stillinger 04/21/2014,9:17 AM

## 2014-04-21 NOTE — Progress Notes (Signed)
    Subjective: No complaints  Objective: Vital signs in last 24 hours: Temp:  [97.7 F (36.5 C)-98.4 F (36.9 C)] 98.4 F (36.9 C) (10/29 0430) Pulse Rate:  [75-89] 75 (10/29 0430) Resp:  [18-19] 19 (10/29 0430) BP: (134-135)/(60-72) 134/70 mmHg (10/29 0430) SpO2:  [93 %-94 %] 93 % (10/29 0430) Last BM Date: 04/19/14  Intake/Output from previous day: 10/28 0701 - 10/29 0700 In: 480 [P.O.:480] Out: 800 [Urine:800] Intake/Output this shift: Total I/O In: -  Out: 300 [Urine:300]  Medications Current Facility-Administered Medications  Medication Dose Route Frequency Provider Last Rate Last Dose  . allopurinol (ZYLOPRIM) tablet 300 mg  300 mg Oral Daily Dayna N Dunn, PA-C   300 mg at 04/20/14 1033  . fentaNYL (SUBLIMAZE) injection 12.5-25 mcg  12.5-25 mcg Intravenous Once Kara Mead V, MD      . heparin ADULT infusion 100 units/mL (25000 units/250 mL)  1,700 Units/hr Intravenous Continuous Narda Bonds, RPH 17 mL/hr at 04/20/14 1739 1,700 Units/hr at 04/20/14 1739  . pantoprazole (PROTONIX) EC tablet 40 mg  40 mg Oral Daily Dayna N Dunn, PA-C   40 mg at 04/20/14 1033  . Warfarin - Pharmacist Dosing Inpatient   Does not apply q1800 Jaquita Folds, [facility]        PE: General appearance: alert, cooperative and no distress Lungs: clear to auscultation bilaterally Heart: regular rate and rhythm, S1, S2 normal, no murmur, click, rub or gallop Extremities: 1+ Left LEE, trace on the right Pulses: 2+ radials Skin: Warm and dry Neurologic: Grossly normal  Lab Results:   Recent Labs  04/19/14 0510  WBC 4.5  HGB 12.3*  HCT 37.2*  PLT 171   BMET  Recent Labs  04/19/14 0510  NA 141  K 4.4  CL 104  CO2 23  GLUCOSE 101*  BUN 15  CREATININE 0.73  CALCIUM 8.4   PT/INR  Recent Labs  04/19/14 0510 04/20/14 0500 04/21/14 0335  LABPROT 14.6 15.4* 16.8*  INR 1.13 1.21 1.35      Assessment/Plan   1. Acute sub massive bilateral pulmonary emboli with history of PE  remotely - currently being anticoagulated  2. Anticoagulation currently intravenous heparin being transitioned to warfarin  INR 1.35.  Two days of overlap once therapeutic.   3. Urinary and penile bleeding on anticoagulation:   Hgb has been stable.  Urology to see in the office.  Previously scheduled appt with Dr. Jeffie Pollock in 1-2 weeks. 4. HTN - controlled/stable  5. CAD with 80% D2 by cath, 30% mid LAD and RCA with normal LVF - no anginal chest pain  6. Pulmonary HTN by cath - PASP 73mmHg  7. Prostate CA  8. H/O subdural hematoma 03/2012 therefore will avoid NOAC  9. AAA 3cm x 3cm by [place] 03/2013  10. Acute LLE DVT   He reports that he has not been out of bed much at all.  Ambulate today.      LOS: 9 days    HAGER, BRYAN PA-C 04/21/2014 8:21 AM  Patient seen and personally examined by me.  Agree with note as outlined by Tarri Fuller, PA-C.  Remains on Heparin while loading Coumadin.  INR very slowly increased.  Pharmacy dosing.    Signed: Fransico Him, MD [facility] 04/21/2014

## 2014-04-22 LAB — BASIC METABOLIC PANEL
ANION GAP: 14 (ref 5–15)
BUN: 16 mg/dL (ref 6–23)
CHLORIDE: 103 meq/L (ref 96–112)
CO2: 23 mEq/L (ref 19–32)
Calcium: 8.6 mg/dL (ref 8.4–10.5)
Creatinine, Ser: 0.78 mg/dL (ref 0.50–1.35)
GFR calc non Af Amer: 87 mL/min — ABNORMAL LOW (ref >90)
Glucose, Bld: 126 mg/dL — ABNORMAL HIGH (ref 70–99)
POTASSIUM: 3.9 meq/L (ref 3.7–5.3)
Sodium: 140 mEq/L (ref 137–147)

## 2014-04-22 LAB — CBC
HCT: 36.8 % — ABNORMAL LOW (ref 39.0–52.0)
Hemoglobin: 12.4 g/dL — ABNORMAL LOW (ref 13.0–17.0)
MCH: 32 pg (ref 26.0–34.0)
MCHC: 33.7 g/dL (ref 30.0–36.0)
MCV: 94.8 fL (ref 78.0–100.0)
PLATELETS: 184 10*3/uL (ref 150–400)
RBC: 3.88 MIL/uL — ABNORMAL LOW (ref 4.22–5.81)
RDW: 13.1 % (ref 11.5–15.5)
WBC: 4.8 10*3/uL (ref 4.0–10.5)

## 2014-04-22 LAB — PROTIME-INR
INR: 1.51 — ABNORMAL HIGH (ref 0.00–1.49)
Prothrombin Time: 18.3 seconds — ABNORMAL HIGH (ref 11.6–15.2)

## 2014-04-22 LAB — HEPARIN LEVEL (UNFRACTIONATED): HEPARIN UNFRACTIONATED: 0.62 [IU]/mL (ref 0.30–0.70)

## 2014-04-22 MED ORDER — WARFARIN SODIUM 2.5 MG PO TABS
12.5000 mg | ORAL_TABLET | Freq: Once | ORAL | Status: AC
  Administered 2014-04-22: 12.5 mg via ORAL
  Filled 2014-04-22: qty 1

## 2014-04-22 NOTE — Progress Notes (Signed)
SUBJECTIVE:  Denies chest pain or SOB  OBJECTIVE:   Vitals:   Filed Vitals:   04/21/14 0430 04/21/14 1352 04/21/14 2135 04/22/14 0459  BP: 134/70 124/69 133/65 125/78  Pulse: 75 81 73 76  Temp: 98.4 F (36.9 C) 97.8 F (36.6 C) 98.5 F (36.9 C) 97.5 F (36.4 C)  TempSrc: Oral Oral Oral Oral  Resp: 19 18 18 18   Height:      Weight:      SpO2: 93% 97% 91% 93%   I&O's:   Intake/Output Summary (Last 24 hours) at 04/22/14 0801 Last data filed at 04/22/14 0700  Gross per 24 hour  Intake    564 ml  Output   1325 ml  Net   -761 ml   TELEMETRY: Reviewed telemetry pt in NSR:     PHYSICAL EXAM General: Well developed, well nourished, in no acute distress Head: Eyes PERRLA, No xanthomas.   Normal cephalic and atramatic  Lungs:   Clear bilaterally to auscultation and percussion. Heart:   HRRR S1 S2 Pulses are 2+ & equal. Abdomen: Bowel sounds are positive, abdomen soft and non-tender without masses  Extremities:   No clubbing, cyanosis or edema.  DP +1 Neuro: Alert and oriented X 3. Psych:  Good affect, responds appropriately   LABS: Basic Metabolic Panel: No results found for this basename: NA, K, CL, CO2, GLUCOSE, BUN, CREATININE, CALCIUM, MG, PHOS,  in the last 72 hours Liver Function Tests: No results found for this basename: AST, ALT, ALKPHOS, BILITOT, PROT, ALBUMIN,  in the last 72 hours No results found for this basename: LIPASE, AMYLASE,  in the last 72 hours CBC:  Recent Labs  04/22/14 0302  WBC 4.8  HGB 12.4*  HCT 36.8*  MCV 94.8  PLT 184   Cardiac Enzymes: No results found for this basename: CKTOTAL, CKMB, CKMBINDEX, TROPONINI,  in the last 72 hours BNP: No components found with this basename: POCBNP,  D-Dimer: No results found for this basename: DDIMER,  in the last 72 hours Hemoglobin A1C: No results found for this basename: HGBA1C,  in the last 72 hours Fasting Lipid Panel: No results found for this basename: CHOL, HDL, LDLCALC, TRIG, CHOLHDL,  LDLDIRECT,  in the last 72 hours Thyroid Function Tests: No results found for this basename: TSH, T4TOTAL, FREET3, T3FREE, THYROIDAB,  in the last 72 hours Anemia Panel: No results found for this basename: VITAMINB12, FOLATE, FERRITIN, TIBC, IRON, RETICCTPCT,  in the last 72 hours Coag Panel:   Lab Results  Component Value Date   INR 1.51* 04/22/2014   INR 1.35 04/21/2014   INR 1.21 04/20/2014    RADIOLOGY: Ct Angio Chest Pe W/cm &/or Wo Cm  04/12/2014   ADDENDUM REPORT: 04/12/2014 15:45  ADDENDUM: These results were called by telephone at the time of interpretation on 04/12/2014 at 3:10 pm to Dr. Elsworth Soho, who verbally acknowledged these results.   Electronically Signed   By: Julian Hy M.D.   On: 04/12/2014 15:45   04/12/2014   CLINICAL DATA:  Shortness of breath, elevated D-dimer, cardiac catheterization, history of PE, Coumadin  EXAM: CT ANGIOGRAPHY CHEST WITH CONTRAST  TECHNIQUE: Multidetector CT imaging of the chest was performed using the standard protocol during bolus administration of intravenous contrast. Multiplanar CT image reconstructions and MIPs were obtained to evaluate the vascular anatomy.  CONTRAST:  83mL OMNIPAQUE IOHEXOL 350 MG/ML SOLN  COMPARISON:  CT chest dated 04/01/2012  FINDINGS: Lobar pulmonary emboli at the bifurcation of the right main pulmonary  artery as well as the takeoff of the left upper lobe pulmonary artery. Additional segmental pulmonary emboli within the left lower lobe.  Overall clot burden is moderate to large.  Elevated RV-to-LV ratio (1.32), raising concern for right heart strain.  Evaluation of the lung parenchyma is constrained by respiratory motion. Mild dependent atelectasis in the bilateral lower lobes. Suspect mild emphysematous changes. No suspicious pulmonary nodules. No pleural effusion or pneumothorax.  Visualized thyroid is unremarkable.  Cardiomegaly. No pericardial effusion. Coronary atherosclerosis. Atherosclerotic calcifications of the  aortic arch. Ectasia of the ascending thoracic aorta, measuring 3.9 cm (series 401/image 70).  No suspicious mediastinal, hilar, or axillary lymphadenopathy.  Visualized upper abdomen is notable for vascular calcifications.  Degenerative changes of the thoracolumbar spine. Fracture involving the anterior/superior endplate of the Z36 vertebral body (sagittal image 112), likely subacute but new from 10/20/2013, with mild loss of height.  Review of the MIP images confirms the above findings.  IMPRESSION: Bilateral lobar pulmonary emboli, as above. Overall clot burden is moderate to large.  Positive for acute PE with CT evidence of right heart strain (RV/LV Ratio = 1.3), consistent with at least submassive (intermediate risk) PE. The presence of right heart strain has been associated with an increased risk of morbidity and mortality. Consultation with Pulmonary and Critical Care Medicine is recommended.  Subacute fracture involving the anterior/ superior endplate of the U44 vertebral body, as above.  Electronically Signed: By: Julian Hy M.D. On: 04/12/2014 14:49   Assessment/Plan  1. Acute sub massive bilateral pulmonary emboli with history of PE remotely - currently being anticoagulated  2. Anticoagulation currently intravenous heparin being transitioned to warfarin INR 1.51. Two days of overlap once therapeutic.  3. Urinary and penile bleeding on anticoagulation: Hgb has been stable. Urology to see in the office. Previously scheduled appt with Dr. Jeffie Pollock in 1-2 weeks.  4. HTN - controlled/stable  5. CAD with 80% D2 by cath, 30% mid LAD and RCA with normal LVF - no anginal chest pain  6. Pulmonary HTN by cath - PASP 57mmHg  7. Prostate CA  8. H/O subdural hematoma 03/2012 therefore will avoid NOAC  9. AAA 3cm x 3cm by [place] 03/2013  10. Acute LLE DVT    Sueanne Margarita, MD  04/22/2014  8:01 AM

## 2014-04-22 NOTE — Progress Notes (Signed)
ANTICOAGULATION CONSULT NOTE - Follow Up Consult  Pharmacy Consult for Heparin/Coumadin Indication: DVT and PE  No Known Allergies  Patient Measurements: Height: 6\' 1"  (185.4 cm) Weight: 240 lb 1.3 oz (108.9 kg) IBW/kg (Calculated) : 79.9 Heparin Dosing Weight: 102.5  Vital Signs: Temp: 97.5 F (36.4 C) (10/30 0459) Temp Source: Oral (10/30 0459) BP: 125/78 mmHg (10/30 0459) Pulse Rate: 76 (10/30 0459)  Labs:  Recent Labs  04/20/14 0500 04/20/14 1415 04/21/14 0335 04/22/14 0302  HGB  --   --   --  12.4*  HCT  --   --   --  36.8*  PLT  --   --   --  184  LABPROT 15.4*  --  16.8* 18.3*  INR 1.21  --  1.35 1.51*  HEPARINUNFRC 0.28* 0.42 0.38 0.62    Estimated Creatinine Clearance: 104.8 ml/min (by C-G formula based on Cr of 0.73).  Assessment: Admit Complaint: h/o PE in 2005 s/p 12 mo of warf and hx of prostate CA s/p radiation in 2013. Now admitted with dyspnea for L/R heart cath. Cath revealed severe PAH and CTA chest revealed bilateral lobar submassive PE with right heart strain (RV/LV ratio 1.3).   Anticoagulation: (Coum pts 6): new DVT, new PE. Bleeding from penis - resolved, urology aware. NS consult OK'd AC on 10/22; SDH resolved Oct '13, avoiding NOAC. Plan is for at least a 48hr overlap with INR > 2 per Dr Percival Spanish 10/23.   Heparin level 0.62, INR up to 1.51  IGoal of Therapy:  HL 0.3-0.5 INR 2-3 Monitor platelets by anticoagulation protocol: Yes   Plan:  -Decrease heparin to 1600 units/hr (goal HL 0.3-0.5) -Coumadin 12.5 mg x 1 today -Daily INR, HL, and CBC -Plan heparin/warfarin overlap for at least 48 hrs when INR > 2  Candie Mile 04/22/2014,8:15 AM

## 2014-04-23 LAB — PROTIME-INR
INR: 1.6 — AB (ref 0.00–1.49)
PROTHROMBIN TIME: 19.2 s — AB (ref 11.6–15.2)

## 2014-04-23 LAB — CBC
HCT: 37.8 % — ABNORMAL LOW (ref 39.0–52.0)
Hemoglobin: 12.8 g/dL — ABNORMAL LOW (ref 13.0–17.0)
MCH: 32 pg (ref 26.0–34.0)
MCHC: 33.9 g/dL (ref 30.0–36.0)
MCV: 94.5 fL (ref 78.0–100.0)
Platelets: 183 10*3/uL (ref 150–400)
RBC: 4 MIL/uL — ABNORMAL LOW (ref 4.22–5.81)
RDW: 13.3 % (ref 11.5–15.5)
WBC: 4.5 10*3/uL (ref 4.0–10.5)

## 2014-04-23 LAB — HEPARIN LEVEL (UNFRACTIONATED): Heparin Unfractionated: 0.37 IU/mL (ref 0.30–0.70)

## 2014-04-23 MED ORDER — WARFARIN SODIUM 7.5 MG PO TABS
15.0000 mg | ORAL_TABLET | Freq: Once | ORAL | Status: AC
  Administered 2014-04-23: 15 mg via ORAL
  Filled 2014-04-23: qty 2

## 2014-04-23 NOTE — Progress Notes (Signed)
ANTICOAGULATION CONSULT NOTE - Follow Up Consult  Pharmacy Consult for Heparin/Coumadin Indication: DVT and PE  No Known Allergies  Patient Measurements: Height: 6\' 1"  (185.4 cm) Weight: 240 lb 1.3 oz (108.9 kg) IBW/kg (Calculated) : 79.9 Heparin Dosing Weight: 102.5  Vital Signs: Temp: 98.1 F (36.7 C) (10/31 0511) Temp Source: Oral (10/31 0511) BP: 127/69 mmHg (10/31 0511) Pulse Rate: 67 (10/31 0511)  Labs:  Recent Labs  04/21/14 0335 04/22/14 0302 04/22/14 0924 04/23/14 0509  HGB  --  12.4*  --  12.8*  HCT  --  36.8*  --  37.8*  PLT  --  184  --  183  LABPROT 16.8* 18.3*  --  19.2*  INR 1.35 1.51*  --  1.60*  HEPARINUNFRC 0.38 0.62  --  0.37  CREATININE  --   --  0.78  --     Estimated Creatinine Clearance: 104.8 ml/min (by C-G formula based on Cr of 0.78).  Assessment: Admit Complaint: h/o PE in 2005 s/p 12 mo of warf and hx of prostate CA s/p radiation in 2013. Now admitted with dyspnea for L/R heart cath. Cath revealed severe PAH and CTA chest revealed bilateral lobar submassive PE with right heart strain (RV/LV ratio 1.3).   Anticoagulation: (Coum pts 6): new DVT, new PE. Bleeding from penis - resolved, urology aware. NS consult OK'd AC on 10/22; SDH resolved Oct '13, avoiding NOAC. Plan is for at least a 48hr overlap with INR > 2 per Dr Percival Spanish 10/23.   Heparin level 0.37, INR up to 1.6  IGoal of Therapy:  HL 0.3-0.5 INR 2-3 Monitor platelets by anticoagulation protocol: Yes   Plan:  -Continue heparin at 1600 units/hr (goal HL 0.3-0.5) -Coumadin 15 mg x 1 today -Daily INR, HL, and CBC -Plan heparin/warfarin overlap for at least 48 hrs when INR > 2  [name] 04/23/2014,11:01 AM

## 2014-04-23 NOTE — Progress Notes (Signed)
  Patient Name: [name] Date of Encounter: 04/23/2014  Principal Problem:   Acute pulmonary embolism Active Problems:   Pulmonary HTN   Bilateral pulmonary embolism   DVT, lower extremity, distal, acute   Length of Stay: 11  SUBJECTIVE  Denies dyspnea or chest pain. Persistent left calf edema, not tense  CURRENT MEDS . allopurinol  300 mg Oral Daily  . pantoprazole  40 mg Oral Daily  . Warfarin - Pharmacist Dosing Inpatient   Does not apply q1800    OBJECTIVE   Intake/Output Summary (Last 24 hours) at 04/23/14 0958 Last data filed at 04/23/14 0700  Gross per 24 hour  Intake    360 ml  Output    850 ml  Net   -490 ml   Filed Weights   04/14/14 2352 04/17/14 0436 04/18/14 0554  Weight: 240 lb 8.4 oz (109.1 kg) 242 lb 11.6 oz (110.1 kg) 240 lb 1.3 oz (108.9 kg)    PHYSICAL EXAM Filed Vitals:   04/22/14 0459 04/22/14 1325 04/22/14 2020 04/23/14 0511  BP: 125/78 138/69 129/69 127/69  Pulse: 76 76 81 67  Temp: 97.5 F (36.4 C) 97.6 F (36.4 C) 98.7 F (37.1 C) 98.1 F (36.7 C)  TempSrc: Oral Oral Oral Oral  Resp: 18 19 20 17   Height:      Weight:      SpO2: 93% 96% 94% 93%   General: Alert, oriented x3, no distress Head: no evidence of trauma, PERRL, EOMI, no exophtalmos or lid lag, no myxedema, no xanthelasma; normal ears, nose and oropharynx Neck: normal jugular venous pulsations and no hepatojugular reflux; brisk carotid pulses without delay and no carotid bruits Chest: clear to auscultation, no signs of consolidation by percussion or palpation, normal fremitus, symmetrical and full respiratory excursions Cardiovascular: normal position and quality of the apical impulse, regular rhythm, normal first and second heart sounds, no rubs or gallops, no murmur Abdomen: no tenderness or distention, no masses by palpation, no abnormal pulsatility or arterial bruits, normal bowel sounds, no hepatosplenomegaly Extremities: no clubbing, cyanosis or edema; 2+ radial,  ulnar and brachial pulses bilaterally; 2+ right femoral, posterior tibial and dorsalis pedis pulses; 2+ left femoral, posterior tibial and dorsalis pedis pulses; no subclavian or femoral bruits Neurological: grossly nonfocal  LABS  CBC  Recent Labs  04/22/14 0302 04/23/14 0509  WBC 4.8 4.5  HGB 12.4* 12.8*  HCT 36.8* 37.8*  MCV 94.8 94.5  PLT 184 500   Basic Metabolic Panel  Recent Labs  04/22/14 0924  NA 140  K 3.9  CL 103  CO2 23  GLUCOSE 126*  BUN 16  CREATININE 0.78  CALCIUM 8.6    Radiology Studies Imaging results have been reviewed and No results found.  TELE NSR  ECG NSR, RBBB, LAFB  ASSESSMENT AND PLAN  S/P recurrent pulmonary embolism, unprovoked. Lifelong warfarin. Now on heparin with plan for 48h overlap with INR >2 History of subdural hematoma requiring evacuation in 2013. Keep INR 2.0-2.5.  Sanda Klein, MD, [facility] [phone] office [phone] pager 04/23/2014 9:58 AM

## 2014-04-24 LAB — CBC
HCT: 38.1 % — ABNORMAL LOW (ref 39.0–52.0)
Hemoglobin: 13 g/dL (ref 13.0–17.0)
MCH: 32.1 pg (ref 26.0–34.0)
MCHC: 34.1 g/dL (ref 30.0–36.0)
MCV: 94.1 fL (ref 78.0–100.0)
PLATELETS: 191 10*3/uL (ref 150–400)
RBC: 4.05 MIL/uL — ABNORMAL LOW (ref 4.22–5.81)
RDW: 13.2 % (ref 11.5–15.5)
WBC: 4 10*3/uL (ref 4.0–10.5)

## 2014-04-24 LAB — PROTIME-INR
INR: 1.86 — ABNORMAL HIGH (ref 0.00–1.49)
PROTHROMBIN TIME: 21.6 s — AB (ref 11.6–15.2)

## 2014-04-24 LAB — HEPARIN LEVEL (UNFRACTIONATED): HEPARIN UNFRACTIONATED: 0.61 [IU]/mL (ref 0.30–0.70)

## 2014-04-24 MED ORDER — WARFARIN SODIUM 7.5 MG PO TABS
15.0000 mg | ORAL_TABLET | Freq: Once | ORAL | Status: AC
  Administered 2014-04-24: 15 mg via ORAL
  Filled 2014-04-24: qty 2

## 2014-04-24 NOTE — Plan of Care (Addendum)
Problem: Discharge Progression Outcomes Goal: Complications resolved/controlled Outcome: Progressing/ awaiting INR to be theraputic

## 2014-04-24 NOTE — Progress Notes (Signed)
ANTICOAGULATION CONSULT NOTE - Follow Up Consult  Pharmacy Consult for Heparin/Coumadin Indication: DVT and PE  No Known Allergies  Patient Measurements: Height: 6\' 1"  (185.4 cm) Weight: 240 lb 1.3 oz (108.9 kg) IBW/kg (Calculated) : 79.9 Heparin Dosing Weight: 102.5  Vital Signs: Temp: 97.6 F (36.4 C) (11/01 0543) Temp Source: Oral (11/01 0543) BP: 140/68 mmHg (11/01 0543) Pulse Rate: 62 (11/01 0543)  Labs:  Recent Labs  04/22/14 0302 04/22/14 0924 04/23/14 0509 04/24/14 0451  HGB 12.4*  --  12.8* 13.0  HCT 36.8*  --  37.8* 38.1*  PLT 184  --  183 191  LABPROT 18.3*  --  19.2* 21.6*  INR 1.51*  --  1.60* 1.86*  HEPARINUNFRC 0.62  --  0.37 0.61  CREATININE  --  0.78  --   --     Estimated Creatinine Clearance: 104.8 mL/min (by C-G formula based on Cr of 0.78).  Assessment: Admit Complaint: h/o PE in 2005 s/p 12 mo of warf and hx of prostate CA s/p radiation in 2013. Now admitted with dyspnea for L/R heart cath. Cath revealed severe PAH and CTA chest revealed bilateral lobar submassive PE with right heart strain (RV/LV ratio 1.3).   Anticoagulation: (Coum pts 6): new DVT, new PE. Bleeding from penis - resolved, urology aware. NS consult OK'd AC on 10/22; SDH resolved Oct '13, avoiding NOAC. Plan is for at least a 48hr overlap with INR > 2 per Dr Percival Spanish 10/23.   Heparin level 0.61, INR up to 1.86  IGoal of Therapy:  HL 0.3-0.5 INR 2-3 Monitor platelets by anticoagulation protocol: Yes   Plan:  -Decrease heparin to 1500 units/hr (goal HL 0.3-0.5) -Coumadin 15 mg x 1 today -Daily INR, HL, and CBC -Plan heparin/warfarin overlap for at least 48 hrs when INR > 2  Candie Mile 04/24/2014,10:28 AM

## 2014-04-24 NOTE — Progress Notes (Signed)
Patient ID: Arvil Utz, male   DOB: Jul 29, 1940, 73 y.o.   MRN: [id number]  Patient Name: [name] Date of Encounter: 04/24/2014  Principal Problem:   Acute pulmonary embolism Active Problems:   Pulmonary HTN   Bilateral pulmonary embolism   DVT, lower extremity, distal, acute   Length of Stay: 12  SUBJECTIVE  Denies dyspnea or chest pain. Persistent left calf edema, improving  CURRENT MEDS . allopurinol  300 mg Oral Daily  . pantoprazole  40 mg Oral Daily  . warfarin  15 mg Oral ONCE-1800  . Warfarin - Pharmacist Dosing Inpatient   Does not apply q1800    OBJECTIVE   Intake/Output Summary (Last 24 hours) at 04/24/14 1257 Last data filed at 04/24/14 0900  Gross per 24 hour  Intake 1009.98 ml  Output    525 ml  Net 484.98 ml   Filed Weights   04/14/14 2352 04/17/14 0436 04/18/14 0554  Weight: 240 lb 8.4 oz (109.1 kg) 242 lb 11.6 oz (110.1 kg) 240 lb 1.3 oz (108.9 kg)    PHYSICAL EXAM Filed Vitals:   04/23/14 0511 04/23/14 1420 04/23/14 1927 04/24/14 0543  BP: 127/69 129/69 136/72 140/68  Pulse: 67 66 98 62  Temp: 98.1 F (36.7 C) 98 F (36.7 C) 97.6 F (36.4 C) 97.6 F (36.4 C)  TempSrc: Oral Oral Oral Oral  Resp: 17 19 18 16   Height:      Weight:      SpO2: 93% 98% 94% 95%   General: Alert, oriented x3, no distress Head: no evidence of trauma, PERRL, EOMI, no exophtalmos or lid lag, no myxedema, no xanthelasma; normal ears, nose and oropharynx Neck: normal jugular venous pulsations and no hepatojugular reflux; brisk carotid pulses without delay and no carotid bruits Chest: clear to auscultation, no signs of consolidation by percussion or palpation, normal fremitus, symmetrical and full respiratory excursions Cardiovascular: normal position and quality of the apical impulse, regular rhythm, normal first and second heart sounds, no rubs or gallops, no murmur Abdomen: no tenderness or distention, no masses by palpation, no abnormal pulsatility or  arterial bruits, normal bowel sounds, no hepatosplenomegaly Extremities: no clubbing, cyanosis or edema; 2+ radial, ulnar and brachial pulses bilaterally; 2+ right femoral, posterior tibial and dorsalis pedis pulses; 2+ left femoral, posterior tibial and dorsalis pedis pulses; no subclavian or femoral bruits Neurological: grossly nonfocal  LABS  CBC  Recent Labs  04/23/14 0509 04/24/14 0451  WBC 4.5 4.0  HGB 12.8* 13.0  HCT 37.8* 38.1*  MCV 94.5 94.1  PLT 183 962   Basic Metabolic Panel  Recent Labs  04/22/14 0924  NA 140  K 3.9  CL 103  CO2 23  GLUCOSE 126*  BUN 16  CREATININE 0.78  CALCIUM 8.6    Radiology Studies Imaging results have been reviewed and No results found.  TELE NSR  ECG NSR, RBBB, LAFB  ASSESSMENT AND PLAN  S/P recurrent pulmonary embolism, unprovoked. Lifelong warfarin. INR 1.86 today Now on heparin with plan for 48h overlap with INR >2 History of subdural hematoma requiring evacuation in 2013. Keep INR 2.0-2.5.  Chantay Whitelock,M.D. 04/24/2014 12:57 PM

## 2014-04-25 LAB — CBC
HEMATOCRIT: 38.7 % — AB (ref 39.0–52.0)
Hemoglobin: 13.1 g/dL (ref 13.0–17.0)
MCH: 32.1 pg (ref 26.0–34.0)
MCHC: 33.9 g/dL (ref 30.0–36.0)
MCV: 94.9 fL (ref 78.0–100.0)
Platelets: 185 10*3/uL (ref 150–400)
RBC: 4.08 MIL/uL — ABNORMAL LOW (ref 4.22–5.81)
RDW: 13.2 % (ref 11.5–15.5)
WBC: 4.1 10*3/uL (ref 4.0–10.5)

## 2014-04-25 LAB — HEPARIN LEVEL (UNFRACTIONATED): Heparin Unfractionated: 0.49 IU/mL (ref 0.30–0.70)

## 2014-04-25 LAB — PROTIME-INR
INR: 2.24 — AB (ref 0.00–1.49)
PROTHROMBIN TIME: 24.9 s — AB (ref 11.6–15.2)

## 2014-04-25 MED ORDER — WARFARIN SODIUM 10 MG PO TABS
10.0000 mg | ORAL_TABLET | Freq: Once | ORAL | Status: AC
  Administered 2014-04-25: 10 mg via ORAL
  Filled 2014-04-25: qty 1

## 2014-04-25 NOTE — Progress Notes (Signed)
   04/25/14 1500  Clinical Encounter Type  Visited With Patient and family together  Visit Type Initial   Chaplain visited briefly with patient. Patient was being visited by his wife. Patient did not have much to talk about but indicated that he is ready to go home. Patient's wife was very supportive and identified the patient as Sherryll Burger while she identifies as a [place] will continue to provide emotional and spiritual support for patient and patient's family as needed. Gar Ponto, Chaplain  3:09 PM

## 2014-04-25 NOTE — Progress Notes (Signed)
    Subjective: No specific complaint.  Wants to go home.   Objective: Vital signs in last 24 hours: Temp:  [97.8 F (36.6 C)-98.9 F (37.2 C)] 97.9 F (36.6 C) (11/02 0431) Pulse Rate:  [64-84] 67 (11/02 0431) Resp:  [12-19] 12 (11/02 0431) BP: (126-143)/(59-68) 126/63 mmHg (11/02 0431) SpO2:  [95 %-98 %] 96 % (11/02 0431) Last BM Date: 04/23/14  Intake/Output from previous day: 11/01 0701 - 11/02 0700 In: 500.7 [P.O.:360; I.V.:140.7] Out: 425 [Urine:425] Intake/Output this shift:    Medications Current Facility-Administered Medications  Medication Dose Route Frequency Provider Last Rate Last Dose  . allopurinol (ZYLOPRIM) tablet 300 mg  300 mg Oral Daily Dayna N Dunn, PA-C   300 mg at 04/24/14 1021  . heparin ADULT infusion 100 units/mL (25000 units/250 mL)  1,500 Units/hr Intravenous Continuous Lelon Perla, MD 15 mL/hr at 04/24/14 2329 1,500 Units/hr at 04/24/14 2329  . pantoprazole (PROTONIX) EC tablet 40 mg  40 mg Oral Daily Dayna N Dunn, PA-C   40 mg at 04/24/14 1021  . Warfarin - Pharmacist Dosing Inpatient   Does not apply q1800 Jaquita Folds, [facility]        PE: General appearance: alert, cooperative and no distress Lungs: clear to auscultation bilaterally Heart: regular rate and rhythm, S1, S2 normal, no murmur, click, rub or gallop Extremities: No LEE Pulses: 2+ and symmetric Skin: Warm and dry Neurologic: Grossly normal  Lab Results:   Recent Labs  04/23/14 0509 04/24/14 0451 04/25/14 0354  WBC 4.5 4.0 4.1  HGB 12.8* 13.0 13.1  HCT 37.8* 38.1* 38.7*  PLT 183 191 185   BMET  Recent Labs  04/22/14 0924  NA 140  K 3.9  CL 103  CO2 23  GLUCOSE 126*  BUN 16  CREATININE 0.78  CALCIUM 8.6   PT/INR  Recent Labs  04/23/14 0509 04/24/14 0451 04/25/14 0500  LABPROT 19.2* 21.6* 24.9*  INR 1.60* 1.86* 2.24*     Assessment/Plan    Principal Problem:   Acute pulmonary embolism Active Problems:   Pulmonary HTN   Bilateral pulmonary  embolism   DVT, lower extremity, distal, acute  Plan:   Heparin to coumadin bridging.  INR 2.24.  Needs 48 hours of overlap. He is anxious to go home.  We can probably DC home tomorrow after lunch.  BP and HR stable.  History of subdural hematoma requiring evacuation in 2013.  Keep INR 2.0-2.5.  He says he is ambulating well and feels he is getting stronger.     LOS: 13 days    HAGER, BRYAN PA-C 04/25/2014 9:18 AM  Patient examined chart reviewed Agree with d/c in am as his INR will be Rx over 36 hours  No NOAC due to previous CNS bleed  Jenkins Rouge

## 2014-04-25 NOTE — Plan of Care (Signed)
Problem: Discharge Progression Outcomes Goal: Complications resolved/controlled Outcome: Completed/Met Date Met:  04/25/14 Goal: Activity appropriate for discharge plan Outcome: Adequate for Discharge

## 2014-04-25 NOTE — Progress Notes (Addendum)
ANTICOAGULATION CONSULT NOTE - Follow Up Consult  Pharmacy Consult for Heparin/Coumadin Indication: DVT and PE  No Known Allergies  Patient Measurements: Height: 6\' 1"  (185.4 cm) Weight: 240 lb 1.3 oz (108.9 kg) IBW/kg (Calculated) : 79.9 Heparin Dosing Weight: 102.5  Vital Signs: Temp: 97.9 F (36.6 C) (11/02 0431) Temp Source: Oral (11/02 0431) BP: 126/63 mmHg (11/02 0431) Pulse Rate: 67 (11/02 0431)  Labs:  Recent Labs  04/23/14 0509 04/24/14 0451 04/25/14 0354 04/25/14 0500  HGB 12.8* 13.0 13.1  --   HCT 37.8* 38.1* 38.7*  --   PLT 183 191 185  --   LABPROT 19.2* 21.6*  --  24.9*  INR 1.60* 1.86*  --  2.24*  HEPARINUNFRC 0.37 0.61  --  0.49    Estimated Creatinine Clearance: 104.8 mL/min (by C-G formula based on Cr of 0.78).  Assessment: Admit Complaint: h/o PE in 2005 s/p 12 mo of warf and hx of prostate CA s/p radiation in 2013. Now admitted with dyspnea for L/R heart cath. Cath revealed severe PAH and CTA chest revealed bilateral lobar submassive PE with right heart strain (RV/LV ratio 1.3).   Anticoagulation: (Coum pts 6): new DVT, new PE. Bleeding from penis - resolved, urology aware. NS consult OK'd AC on 10/22; SDH resolved Oct '13, avoiding NOAC. Plan is for at least a 48hr overlap with INR > 2 per Dr Percival Spanish 10/23.   Heparin level 0.49, INR 2.24 - both therapeutic. No bleeding reported.  Goal of Therapy:  HL 0.3-0.5 units/ml INR 2-2.5 per Cards note Monitor platelets by anticoagulation protocol: Yes   Plan:  -Continue heparin drip at 1500 units/hr -Coumadin 10 mg x 1 today -Daily INR, HL, and CBC -Plan heparin/warfarin overlap for at least 48 hrs when INR > 2  [facility], Pharm.D., BCPS Clinical Pharmacist Pager: [phone] 04/25/2014 10:04 AM   Addendum: Damaris Schooner with Gaspar Bidding, PA about needing a 48 hr overlap of heparin to Coumadin. Typically for new VTE, 24 hr overlap and five day minimum is needed to ensure factor II is blocked by Coumadin.  He has been on Coumadin since 10/23 and feel this factor is blocked now. Updated patient with this info.  [place], Pharm.D., BCPS Clinical Pharmacist Pager: [phone] 04/25/2014 10:27 AM

## 2014-04-26 LAB — CBC
HCT: 37 % — ABNORMAL LOW (ref 39.0–52.0)
HEMOGLOBIN: 12.6 g/dL — AB (ref 13.0–17.0)
MCH: 32.1 pg (ref 26.0–34.0)
MCHC: 34.1 g/dL (ref 30.0–36.0)
MCV: 94.4 fL (ref 78.0–100.0)
Platelets: 192 10*3/uL (ref 150–400)
RBC: 3.92 MIL/uL — ABNORMAL LOW (ref 4.22–5.81)
RDW: 13.3 % (ref 11.5–15.5)
WBC: 3.9 10*3/uL — ABNORMAL LOW (ref 4.0–10.5)

## 2014-04-26 LAB — PROTIME-INR
INR: 2.18 — ABNORMAL HIGH (ref 0.00–1.49)
PROTHROMBIN TIME: 24.4 s — AB (ref 11.6–15.2)

## 2014-04-26 LAB — HEPARIN LEVEL (UNFRACTIONATED): Heparin Unfractionated: 0.58 IU/mL (ref 0.30–0.70)

## 2014-04-26 MED ORDER — WARFARIN SODIUM 5 MG PO TABS: 10.0000 mg | ORAL_TABLET | Freq: Every day | ORAL | Status: DC

## 2014-04-26 NOTE — Progress Notes (Signed)
Pt discharging home with wife.  All instructions reviewed and prescription sent to pharm of choice.  All questions answered.

## 2014-04-26 NOTE — Progress Notes (Signed)
ANTICOAGULATION CONSULT NOTE - Follow Up Consult  Pharmacy Consult for Heparin/Coumadin Indication: DVT and PE  No Known Allergies  Patient Measurements: Height: 6\' 1"  (185.4 cm) Weight: 240 lb 1.3 oz (108.9 kg) IBW/kg (Calculated) : 79.9 Heparin Dosing Weight: 102.5  Vital Signs: Temp: 98.3 F (36.8 C) (11/03 0500) Temp Source: Oral (11/03 0500) BP: 133/69 mmHg (11/03 0500) Pulse Rate: 64 (11/03 0500)  Labs:  Recent Labs  04/24/14 0451 04/25/14 0354 04/25/14 0500 04/26/14 0514  HGB 13.0 13.1  --  12.6*  HCT 38.1* 38.7*  --  37.0*  PLT 191 185  --  192  LABPROT 21.6*  --  24.9* 24.4*  INR 1.86*  --  2.24* 2.18*  HEPARINUNFRC 0.61  --  0.49 0.58    Estimated Creatinine Clearance: 104.8 mL/min (by C-G formula based on Cr of 0.78).  Assessment: Admit Complaint: h/o PE in 2005 s/p 12 mo of warf and hx of prostate CA s/p radiation in 2013. Now admitted with dyspnea for L/R heart cath. Cath revealed severe PAH and CTA chest revealed bilateral lobar submassive PE with right heart strain (RV/LV ratio 1.3).   Anticoagulation: (Coum pts 6): new DVT, new PE. Bleeding from penis - resolved, urology aware. NS consult OK'd AC on 10/22; SDH resolved Oct '13, avoiding NOAC. Plan is for at least a 48hr overlap with INR > 2 per Dr Percival Spanish 10/23.   Heparin level 0.58, INR 2.18 - both therapeutic. No bleeding reported.  Goal of Therapy:  HL 0.3-0.5 units/ml INR 2-2.5 per Cards note Monitor platelets by anticoagulation protocol: Yes   Plan:  -Discontinue heparin drip -Daily INR -Recommend discharge on warfarin 10 mg PO daily except 15 mg on Tues/Thurs  [facility], [place].D., BCPS Clinical Pharmacist Pager: [phone] 04/26/2014 10:03 AM

## 2014-04-26 NOTE — Discharge Summary (Signed)
Physician Discharge Summary     Cardiologist:  Crenshaw Patient ID: [name] MRN: [id number] DOB/AGE: 74-Jan-1942 74 y.o.  Admit date: 04/12/2014 Discharge date: 04/26/2014  Admission Diagnoses:  Acute pulmonary embolism  Discharge Diagnoses:  Principal Problem:   Acute pulmonary embolism Active Problems:   Pulmonary HTN   Bilateral pulmonary embolism   DVT, lower extremity, distal, acute   Discharged Condition: stable  Hospital Course:   73 yo male follow by Dr. Stanford Berry and has a history of unexplained pulmonary embolus approximately 8 years ago treated with one year of Coumadin. PFTs in September 2013 showed mild obstructive disease. Echocardiogram in October of 2013 showed normal LV function, grade 1 diastolic dysfunction, moderate left ventricular hypertrophy, mild left atrial enlargement and trace aortic insufficiency. Dobutamine echocardiogram in November of 2013 was normal. Chest CT showed no pulmonary embolus. There was coronary artery atherosclerosis and emphysema noted. Patient was admitted in October of 2013 and found to have a subdural hematoma which required evacuation. Abdominal ultrasound in October of 2014 showed a 3.3 x 3.0 cm abdominal aortic aneurysm. Followup recommended in one year. Since I last saw him, His dyspnea has worsened. He describes dyspnea after walking across the room. There is no orthopnea, PND, chest pain or syncope. He has chronic mild pedal edema. He was seen recently by his primary care and Lasix was initiated. His symptoms have not improved. He did state his symptoms have markedly worsened in the past 4 weeks. No recent travel.  The was admitted for right and left heart caths(see cath report below). Left heart cath revealed single vessel obstructive CAD involving ostium of the second diagonal.  Normal LV function.  Severe pulmonary HTN. Normal LVEDP. D-dimer was checked and elevated(12.57).  CT chest revealed bilateral lobar pulmonary emboli, as  above. Overall clot burden is moderate to large.  He was placed in IV heparin.   LE venous dopplers revealed DVT in mid to distal Left femoral vein, Left popliteal vein, and Left peroneal veins.  Due to history of SD hematoma Oct 2013, Neurosurgery opinion was obtained regarding anticoagulation. He was ultimately placed on heparin to coumadin bridging with 48 hours of overlap at a therapeutic INR(2.0-2.5).  2D Echo revealed an ef of 55%, RV dilation, RA mod dilation with peak PA pressures on 76mmHg.  He did have some urethral bleeding for a few days.  Hgb remained stable.  [name] consulted for urology and recommended OP follow up with Dr. Jeffie Berry which was already scheduled.    The patient was seen by Dr. Johnsie Berry who felt he was stable for DC home.  Coumadin appt and follow up arranged.    Consults: PCCM, Neurosurgury   Significant Diagnostic Studies:  CT ANGIOGRAPHY CHEST WITH CONTRAST  TECHNIQUE: Multidetector CT imaging of the chest was performed using the standard protocol during bolus administration of intravenous contrast. Multiplanar CT image reconstructions and MIPs were obtained to evaluate the vascular anatomy.  CONTRAST: 35mL OMNIPAQUE IOHEXOL 350 MG/ML SOLN  COMPARISON: CT chest dated 04/01/2012  FINDINGS: Lobar pulmonary emboli at the bifurcation of the right main pulmonary artery as well as the takeoff of the left upper lobe pulmonary artery. Additional segmental pulmonary emboli within the left lower lobe.  Overall clot burden is moderate to large.  Elevated RV-to-LV ratio (1.32), raising concern for right heart strain.  Evaluation of the lung parenchyma is constrained by respiratory motion. Mild dependent atelectasis in the bilateral lower lobes. Suspect mild emphysematous changes. No suspicious pulmonary nodules. No pleural  effusion or pneumothorax.  Visualized thyroid is unremarkable.  Cardiomegaly. No pericardial effusion. Coronary  atherosclerosis. Atherosclerotic calcifications of the aortic arch. Ectasia of the ascending thoracic aorta, measuring 3.9 cm (series 401/image 70).  No suspicious mediastinal, hilar, or axillary lymphadenopathy.  Visualized upper abdomen is notable for vascular calcifications.  Degenerative changes of the thoracolumbar spine. Fracture involving the anterior/superior endplate of the I14 vertebral body (sagittal image 112), likely subacute but new from 10/20/2013, with mild loss of height.  Review of the MIP images confirms the above findings.  IMPRESSION: Bilateral lobar pulmonary emboli, as above. Overall clot burden is moderate to large.  Positive for acute PE with CT evidence of right heart strain (RV/LV Ratio = 1.3), consistent with at least submassive (intermediate risk) PE. The presence of right heart strain has been associated with an increased risk of morbidity and mortality. Consultation with Pulmonary and Critical Care Medicine is recommended.  Subacute fracture involving the anterior/ superior endplate of the E31 vertebral body, as above.   Echocardiogram, 04/13/14 Study Conclusions  - Left ventricle: Technically limited study. There is some flattening of the septum in systole. The cavity size was normal. Wall thickness was increased in a pattern of mild LVH. The estimated ejection fraction was 55%. Regional wall motion abnormalities cannot be excluded. - Aorta: Slight dilitation of aortic root. - Right ventricle: Compared to the LV, the RV is dilated. Visually there is RV dysfunction. The RV doppler (TAPSE) looks normal. I can not explain this, as I feel there is RV dysfunction. - Right atrium: The atrium was moderately dilated. - Pulmonary arteries: PA peak pressure: 64 mm Hg (S). Cardiac Catheterization Procedure Note  Name: [name] MRN: [id number] DOB: 04/29/1941  Procedure: Right Heart Cath, Left Heart Cath, Selective Coronary  Angiography, LV angiography  Indication: 73 yo WM with remote history of pulmonary embolus and coronary calcium by CT presents with acute progressive dyspnea.  Procedural Details: The right wrist was prepped, draped, and anesthetized with 1% lidocaine. Using the modified Seldinger technique a 6 Fr slender sheath was placed in the right radial artery. Attempted to exchange the brachial VI for a 5 Fr sheath but unable to thread a wire through the IV. Able to engage the RCA from a radial approach but unable to engage the LCA due to severe radial spasm and elongation of the aorta making it very difficult to engage. At this point we switched to a femoral approach. The right groin was prepped and draped in a sterile fashion. It was anesthetized with 1% Lidocaine. Using a modified Seldinger technique a 5 Fr sheath was placed in the right femoral artery. A 7 Fr. Sheath was placed in the right femoral vein. A Swan-Ganz catheter was used for the right heart catheterization. Standard protocol was followed for recording of right heart pressures and sampling of oxygen saturations. Fick cardiac output was calculated. I was unable to obtain a PCWP due to marked enlargement of the right heart despite using a Swan wire. Standard Judkins catheters were used for selective coronary angiography and left ventriculography. A JL5 catheter was used for the LCA. There were no immediate procedural complications. The patient was transferred to the post catheterization recovery area for further monitoring.  Procedural Findings: Hemodynamics RA 13/10 mean 10 mm Hg RV 59/8/15 mm Hg PA 59/27 mean 39 mm Hg PCWP N/A LV 122/13 mm Hg AO 1125/83 mean 102 mm Hg  Oxygen saturations: PA 63% AO 94%  Cardiac Output (Fick) 4.64 L/min  Cardiac Index (  Fick) 1.98 L/min meter squared  Coronary angiography: Coronary dominance: right  Left mainstem: Normal  Left anterior descending (LAD): The LAD has diffuse 30%  disease in the mid vessel. There is a large first diagonal that is normal. The second diagonal has an 80% ostial stenosis.  Left circumflex (LCx): Diffuse irregularities less than 20%.  Right coronary artery (RCA): Large dominant artery with 30-40% stenosis in the mid vessel.   Left ventriculography: Left ventricular systolic function is normal, LVEF is estimated at 55-65%, there is no significant mitral regurgitation   Final Conclusions:  1. Single vessel obstructive CAD involving ostium of the second diagonal. 2. Normal LV function. 3. Severe pulmonary HTN. Normal LVEDP.  Recommendations: Will evaluate for possible recurrent pulmonary embolus. D-dimer is 12.5. Also obtain Echo. Pulmonary to evaluate.  Peter Martinique, [place] 04/12/2014, 10:27 AM  Treatments: See above  Discharge Exam: Blood pressure 133/69, pulse 64, temperature 98.3 F (36.8 C), temperature source Oral, resp. rate 18, height 6\' 1"  (1.854 m), weight 240 lb 1.3 oz (108.9 kg), SpO2 93 %.   Disposition: 01-Home or Self Care     Medication List    TAKE these medications        allopurinol 300 MG tablet  Commonly known as:  ZYLOPRIM  Take 300 mg by mouth daily.     aspirin EC 81 MG tablet  Take 1 tablet (81 mg total) by mouth daily.     omeprazole 20 MG capsule  Commonly known as:  PRILOSEC  Take 20 mg by mouth daily.     warfarin 5 MG tablet  Commonly known as:  COUMADIN  Take 2 tablets (10 mg total) by mouth daily.       Follow-up Information    Follow up with Coumadin Clinic On 04/29/2014.   Why:  10:30 AM   Contact information:   [address] [place] [phone]      Follow up with Ermalinda Barrios, PA-C On 05/16/2014.   Specialty:  Cardiology   Why:  1:15 PM   Contact information:   [address] [place] [address] [phone]      Greater than 30 minutes was spent completing the patient's discharge.   SignedTarri Fuller, [place] 04/26/2014, 1:11 PM

## 2014-04-26 NOTE — Progress Notes (Signed)
Patient ID: [name], male   DOB: 01-03-1941, 73 y.o.   MRN: [id number]    Subjective: No specific complaint.  Wants to go home.   Objective: Vital signs in last 24 hours: Temp:  [98.2 F (36.8 C)-98.3 F (36.8 C)] 98.3 F (36.8 C) (11/03 0500) Pulse Rate:  [64-72] 64 (11/03 0500) Resp:  [18] 18 (11/03 0500) BP: (132-133)/(69-72) 133/69 mmHg (11/03 0500) SpO2:  [93 %-98 %] 93 % (11/03 0500) Last BM Date: 03/25/14  Intake/Output from previous day: 11/02 0701 - 11/03 0700 In: 615 [P.O.:240; I.V.:375] Out: 425 [Urine:425] Intake/Output this shift:    Medications Current Facility-Administered Medications  Medication Dose Route Frequency Provider Last Rate Last Dose  . allopurinol (ZYLOPRIM) tablet 300 mg  300 mg Oral Daily Dayna N Dunn, PA-C   300 mg at 04/25/14 1051  . heparin ADULT infusion 100 units/mL (25000 units/250 mL)  1,500 Units/hr Intravenous Continuous Lelon Perla, MD 15 mL/hr at 04/25/14 1638 1,500 Units/hr at 04/25/14 1638  . pantoprazole (PROTONIX) EC tablet 40 mg  40 mg Oral Daily Dayna N Dunn, PA-C   40 mg at 04/25/14 1051  . Warfarin - Pharmacist Dosing Inpatient   Does not apply q1800 Jaquita Folds, [facility]        PE: General appearance: alert, cooperative and no distress Lungs: clear to auscultation bilaterally Heart: regular rate and rhythm, S1, S2 normal, no murmur, click, rub or gallop Extremities: No LEE Pulses: 2+ and symmetric Skin: Warm and dry Neurologic: Grossly normal  Lab Results:   Recent Labs  04/24/14 0451 04/25/14 0354 04/26/14 0514  WBC 4.0 4.1 3.9*  HGB 13.0 13.1 12.6*  HCT 38.1* 38.7* 37.0*  PLT 191 185 192   BMET No results for input(s): NA, K, CL, CO2, GLUCOSE, BUN, CREATININE, CALCIUM in the last 72 hours. PT/INR  Recent Labs  04/24/14 0451 04/25/14 0500 04/26/14 0514  LABPROT 21.6* 24.9* 24.4*  INR 1.86* 2.24* 2.18*     Assessment/Plan    Principal Problem:   Acute pulmonary embolism Active  Problems:   Pulmonary HTN   Bilateral pulmonary embolism   DVT, lower extremity, distal, acute  Plan:   INR Rx with 48 hour overlap   History of subdural hematoma requiring evacuation in 2013.  Keep INR 2.0-2.5.  He says he is ambulating well and feels he is getting stronger.   D/C home  F/U coumadin clinic Friday and with Dr Vinnie Langton

## 2014-04-27 ENCOUNTER — Telehealth: Payer: Self-pay | Admitting: Cardiovascular Disease

## 2014-04-27 ENCOUNTER — Telehealth: Payer: Self-pay | Admitting: Cardiology

## 2014-04-27 NOTE — Telephone Encounter (Signed)
She wants Hilda Blades to call her. He just got out of the hospital yesterday,she have some questions.

## 2014-04-27 NOTE — Telephone Encounter (Signed)
Spoke with pt and wife, they are planning a trip to [place], leaving 05-13-14. They want to make sure okay to travel. Discussed with dr Stanford Breed, pt given okay to travel but he will need to get out of the car and walk around every 1 1/2 hours. Patient voiced understanding  Post hosp ov rescheduled to prior to their leaving.

## 2014-04-27 NOTE — Telephone Encounter (Signed)
Pt gave me the wrong doctor.

## 2014-04-29 ENCOUNTER — Ambulatory Visit (INDEPENDENT_AMBULATORY_CARE_PROVIDER_SITE_OTHER): Payer: Medicare Other | Admitting: *Deleted

## 2014-04-29 DIAGNOSIS — I824Z2 Acute embolism and thrombosis of unspecified deep veins of left distal lower extremity: Secondary | ICD-10-CM

## 2014-04-29 DIAGNOSIS — I272 Pulmonary hypertension, unspecified: Secondary | ICD-10-CM

## 2014-04-29 DIAGNOSIS — I2699 Other pulmonary embolism without acute cor pulmonale: Secondary | ICD-10-CM

## 2014-04-29 DIAGNOSIS — I270 Primary pulmonary hypertension: Secondary | ICD-10-CM

## 2014-04-29 LAB — POCT INR: INR: 3.1

## 2014-04-29 NOTE — Patient Instructions (Signed)

## 2014-05-02 ENCOUNTER — Encounter (HOSPITAL_COMMUNITY): Payer: Medicare Other

## 2014-05-06 ENCOUNTER — Ambulatory Visit (INDEPENDENT_AMBULATORY_CARE_PROVIDER_SITE_OTHER): Payer: Medicare Other | Admitting: *Deleted

## 2014-05-06 DIAGNOSIS — I272 Pulmonary hypertension, unspecified: Secondary | ICD-10-CM

## 2014-05-06 DIAGNOSIS — I824Z2 Acute embolism and thrombosis of unspecified deep veins of left distal lower extremity: Secondary | ICD-10-CM

## 2014-05-06 DIAGNOSIS — I2699 Other pulmonary embolism without acute cor pulmonale: Secondary | ICD-10-CM

## 2014-05-06 DIAGNOSIS — I270 Primary pulmonary hypertension: Secondary | ICD-10-CM

## 2014-05-06 LAB — POCT INR: INR: 6.5

## 2014-05-06 LAB — PROTIME-INR
INR: 6.5 ratio (ref 0.8–1.0)
Prothrombin Time: 68.6 s (ref 9.6–13.1)

## 2014-05-11 ENCOUNTER — Ambulatory Visit (INDEPENDENT_AMBULATORY_CARE_PROVIDER_SITE_OTHER): Payer: Medicare Other | Admitting: Pharmacist

## 2014-05-11 DIAGNOSIS — I270 Primary pulmonary hypertension: Secondary | ICD-10-CM

## 2014-05-11 DIAGNOSIS — I824Z2 Acute embolism and thrombosis of unspecified deep veins of left distal lower extremity: Secondary | ICD-10-CM

## 2014-05-11 DIAGNOSIS — I2699 Other pulmonary embolism without acute cor pulmonale: Secondary | ICD-10-CM

## 2014-05-11 DIAGNOSIS — I272 Pulmonary hypertension, unspecified: Secondary | ICD-10-CM

## 2014-05-11 LAB — POCT INR: INR: 1.8

## 2014-05-12 ENCOUNTER — Ambulatory Visit (INDEPENDENT_AMBULATORY_CARE_PROVIDER_SITE_OTHER): Payer: Medicare Other | Admitting: Cardiology

## 2014-05-12 ENCOUNTER — Other Ambulatory Visit: Payer: Self-pay | Admitting: *Deleted

## 2014-05-12 ENCOUNTER — Encounter: Payer: Self-pay | Admitting: Cardiology

## 2014-05-12 ENCOUNTER — Telehealth: Payer: Self-pay | Admitting: *Deleted

## 2014-05-12 VITALS — BP 159/83 | HR 83 | Ht 73.0 in | Wt 247.4 lb

## 2014-05-12 DIAGNOSIS — I2699 Other pulmonary embolism without acute cor pulmonale: Secondary | ICD-10-CM

## 2014-05-12 MED ORDER — WARFARIN SODIUM 5 MG PO TABS: 10.0000 mg | ORAL_TABLET | Freq: Every day | ORAL | Status: DC

## 2014-05-12 MED ORDER — WARFARIN SODIUM 5 MG PO TABS: 5.0000 mg | ORAL_TABLET | ORAL | Status: DC

## 2014-05-12 NOTE — Progress Notes (Signed)
05/12/2014 [name]   02/13/1941  [id number]  Primary Physician Horatio Pel, MD Primary Cardiologist: Dr. Stanford Breed  HPI:  [name] presents to clinic today for post hospital follow-up. He is a 73 year old male, followed by Dr. Stanford Breed. His history is notable for unexplained pulmonary embolism approximately 8 years ago treated with one year of Coumadin. He was seen recently in clinic by Dr. Stanford Breed and complained of progressively worsening dyspnea. O2 sats were checked and were noted to be 94% at rest and 90% with ambulation. Dr. Stanford Breed recommended evaluation with left heart catheterization. A D-dimer was also ordered. He was scheduled for his left heart catheterization the day after his office visit with Dr. Stanford Breed. The left heart cath revealed single vessel obstructive CAD involving ostium of the second diagonal (no PCI performed, this was felt to be noncritical). He was also noted to have normal LV function, severe pulmonary HTN and normal LVEDP. Continued medical therapy was recommended. D-dimer returned elevated (12.57). Subsequent CT of the chest revealed bilateral lobar pulmonary emboli. Overall clot burden was felt to be moderate to large. LE venous dopplers revealed DVT in mid to distal Left femoral vein, Left popliteal vein, and Left peroneal veins. Right lower extremity was negative for DVT. He was placed on Coumadin with heparin bridge and was discharged from the hospital once INR was therapeutic.  A clinic, he states that he has been doing fairly well in regards to improvement of symptoms. He denies any further dyspnea. No chest pain. He also denies any further pain in his lower extremities. No increased swelling or redness. He reports full medication compliance. His INR is monitored at our [facility]. INR was checked last week and he was noted to be supratherapeutic with INR at 6.1. Subsequently he was instructed to hold his Coumadin for 3 days. This  was rechecked in our office yesterday and he was noted to be subtherapeutic with an INR at 1.8. He was given dosing instructions by our pharmacist to take 5 mg tablets twice a day. His INR will be rechecked in one week. He denies any abnormal bleeding and no falls.  Current Outpatient Prescriptions  Medication Sig Dispense Refill  . allopurinol (ZYLOPRIM) 300 MG tablet Take 300 mg by mouth daily.    Marland Kitchen aspirin EC 81 MG tablet Take 1 tablet (81 mg total) by mouth daily. 90 tablet 3  . omeprazole (PRILOSEC) 20 MG capsule Take 20 mg by mouth daily.    Marland Kitchen warfarin (COUMADIN) 5 MG tablet Take 2 tablets (10 mg total) by mouth daily. 64 tablet 0   No current facility-administered medications for this visit.    No Known Allergies  History   Social History  . Marital Status: Married    Spouse Name: N/A    Number of Children: 3  . Years of Education: N/A   Occupational History  .      Dance movement psychotherapist   Social History Main Topics  . Smoking status: Former Smoker -- 1.00 packs/day for 20 years    Types: Cigarettes    Quit date: 12/23/1971  . Smokeless tobacco: Never Used  . Alcohol Use: 3.6 oz/week    6 Cans of beer per week     Comment: 2 drinks per night, last drink was 03/29/2012, 6 pack of beer a week  . Drug Use: No  . Sexual Activity: Not on file   Other Topics Concern  . Not on file   Social History Narrative  Originally from [place].  Retired to [employer] because he was tired of all the snow.     Review of Systems: General: negative for chills, fever, night sweats or weight changes.  Cardiovascular: negative for chest pain, dyspnea on exertion, edema, orthopnea, palpitations, paroxysmal nocturnal dyspnea or shortness of breath Dermatological: negative for rash Respiratory: negative for cough or wheezing Urologic: negative for hematuria Abdominal: negative for nausea, vomiting, diarrhea, bright red blood per rectum, melena, or hematemesis Neurologic: negative for  visual changes, syncope, or dizziness All other systems reviewed and are otherwise negative except as noted above.    Blood pressure 159/83, pulse 83, height 6\' 1"  (1.854 m), weight 247 lb 6.4 oz (112.22 kg).  General appearance: alert, cooperative and no distress Neck: no carotid bruit and no JVD Lungs: clear to auscultation bilaterally Heart: regular rate and rhythm, S1, S2 normal, no murmur, click, rub or gallop Extremities: no LEE Pulses: 2+ and symmetric Skin: warm and dry Neurologic: Grossly normal  EKG not performed  ASSESSMENT AND PLAN:   1. Pulmonary embolism: Hemodynamically stable without recurrent dyspnea or chest pain. Continue Coumadin therapy. INR followed at [address].  2. Left lower extremity DVT: Stable. Denies any increased swelling, pain or erythema. Continue Coumadin therapy. INR followed at [address].   3. Coumadin therapy: INR yesterday was subtherapeutic at 1.8. Patient received dosing instructions from our office pharmacist. Recheck INR in one week at our [facility] office. INR goal is 2.0-3.0. Patient denies any abnormal bleeding no recent history of falls   PLAN  Continue Coumadin therapy for PE and DVT. Continue periodic INR checks. F/U with Dr. Stanford Breed in 2-3 months for reassessment.  Dhwani Venkatesh, BRITTAINYPA-C 05/12/2014  1:44 PM

## 2014-05-12 NOTE — Patient Instructions (Signed)
Your physician recommends that you schedule a follow-up appointment in: 8 weeks with Dr Stanford Breed

## 2014-05-12 NOTE — Telephone Encounter (Signed)
Call in to [facility] and cancel Warfarin #180 with 2 refills was told by tiffany ([address]) that she will refill medication.

## 2014-05-16 ENCOUNTER — Encounter: Payer: Medicare Other | Admitting: Physician Assistant

## 2014-05-17 ENCOUNTER — Telehealth: Payer: Self-pay | Admitting: *Deleted

## 2014-05-17 NOTE — Telephone Encounter (Signed)
A representative from [facility] left a voicemail on the refill line stating that they need an rx for a 90 day supply on the patients coumadin. They also would like to discuss the maximum daily dose. The patient is on allopurinol from another physician and it interacts with the coumadin. The reference number to provide is [id number]. Thanks, MI

## 2014-05-17 NOTE — Telephone Encounter (Signed)
Called [facility] spoke with Velva Harman confirmed we are aware pt is on Allopurinol and Warfarin.  Advised we are closely monitoring pt's INR.  Confirmed 180 tablets is currently a 90 day supply of Warfarin for pt.

## 2014-05-23 ENCOUNTER — Ambulatory Visit (INDEPENDENT_AMBULATORY_CARE_PROVIDER_SITE_OTHER): Payer: Medicare Other | Admitting: Pharmacist Clinician (PhC)/ Clinical Pharmacy Specialist

## 2014-05-23 DIAGNOSIS — I824Z2 Acute embolism and thrombosis of unspecified deep veins of left distal lower extremity: Secondary | ICD-10-CM

## 2014-05-23 DIAGNOSIS — I2699 Other pulmonary embolism without acute cor pulmonale: Secondary | ICD-10-CM

## 2014-05-23 DIAGNOSIS — I272 Pulmonary hypertension, unspecified: Secondary | ICD-10-CM

## 2014-05-23 DIAGNOSIS — I270 Primary pulmonary hypertension: Secondary | ICD-10-CM

## 2014-05-23 LAB — POCT INR: INR: 2.6

## 2014-05-24 ENCOUNTER — Emergency Department (HOSPITAL_COMMUNITY): Payer: Medicare Other

## 2014-05-24 ENCOUNTER — Emergency Department (HOSPITAL_COMMUNITY)
Admission: EM | Admit: 2014-05-24 | Discharge: 2014-05-25 | Disposition: A | Discharge status: Home / Self Care | Payer: Medicare Other | Attending: Emergency Medicine | Admitting: Emergency Medicine

## 2014-05-24 ENCOUNTER — Encounter (HOSPITAL_COMMUNITY): Payer: Self-pay | Admitting: Emergency Medicine

## 2014-05-24 DIAGNOSIS — R0602 Shortness of breath: Secondary | ICD-10-CM

## 2014-05-24 DIAGNOSIS — R000 Tachycardia, unspecified: Secondary | ICD-10-CM | POA: Insufficient documentation

## 2014-05-24 DIAGNOSIS — Z86711 Personal history of pulmonary embolism: Secondary | ICD-10-CM | POA: Diagnosis not present

## 2014-05-24 DIAGNOSIS — Z86010 Personal history of colonic polyps: Secondary | ICD-10-CM | POA: Diagnosis not present

## 2014-05-24 DIAGNOSIS — K219 Gastro-esophageal reflux disease without esophagitis: Secondary | ICD-10-CM | POA: Insufficient documentation

## 2014-05-24 DIAGNOSIS — Z7982 Long term (current) use of aspirin: Secondary | ICD-10-CM | POA: Diagnosis not present

## 2014-05-24 DIAGNOSIS — Z79899 Other long term (current) drug therapy: Secondary | ICD-10-CM | POA: Insufficient documentation

## 2014-05-24 DIAGNOSIS — M1990 Unspecified osteoarthritis, unspecified site: Secondary | ICD-10-CM | POA: Insufficient documentation

## 2014-05-24 DIAGNOSIS — K2270 Barrett's esophagus without dysplasia: Secondary | ICD-10-CM | POA: Insufficient documentation

## 2014-05-24 DIAGNOSIS — Z8546 Personal history of malignant neoplasm of prostate: Secondary | ICD-10-CM | POA: Insufficient documentation

## 2014-05-24 DIAGNOSIS — R0902 Hypoxemia: Secondary | ICD-10-CM | POA: Insufficient documentation

## 2014-05-24 DIAGNOSIS — I10 Essential (primary) hypertension: Secondary | ICD-10-CM | POA: Insufficient documentation

## 2014-05-24 DIAGNOSIS — R509 Fever, unspecified: Secondary | ICD-10-CM

## 2014-05-24 DIAGNOSIS — Z8619 Personal history of other infectious and parasitic diseases: Secondary | ICD-10-CM | POA: Diagnosis not present

## 2014-05-24 DIAGNOSIS — Z87891 Personal history of nicotine dependence: Secondary | ICD-10-CM | POA: Insufficient documentation

## 2014-05-24 DIAGNOSIS — I714 Abdominal aortic aneurysm, without rupture: Secondary | ICD-10-CM | POA: Insufficient documentation

## 2014-05-24 DIAGNOSIS — I2510 Atherosclerotic heart disease of native coronary artery without angina pectoris: Secondary | ICD-10-CM | POA: Insufficient documentation

## 2014-05-24 DIAGNOSIS — R61 Generalized hyperhidrosis: Secondary | ICD-10-CM | POA: Insufficient documentation

## 2014-05-24 LAB — I-STAT CHEM 8, ED
BUN: 13 mg/dL (ref 6–23)
CALCIUM ION: 1 mmol/L — AB (ref 1.13–1.30)
CREATININE: 0.8 mg/dL (ref 0.50–1.35)
Chloride: 105 mEq/L (ref 96–112)
Glucose, Bld: 134 mg/dL — ABNORMAL HIGH (ref 70–99)
HCT: 40 % (ref 39.0–52.0)
HEMOGLOBIN: 13.6 g/dL (ref 13.0–17.0)
POTASSIUM: 3.8 meq/L (ref 3.7–5.3)
Sodium: 139 mEq/L (ref 137–147)
TCO2: 20 mmol/L (ref 0–100)

## 2014-05-24 LAB — CBC WITH DIFFERENTIAL/PLATELET
BASOS PCT: 0 % (ref 0–1)
Basophils Absolute: 0 10*3/uL (ref 0.0–0.1)
EOS ABS: 0 10*3/uL (ref 0.0–0.7)
EOS PCT: 1 % (ref 0–5)
HCT: 38.7 % — ABNORMAL LOW (ref 39.0–52.0)
Hemoglobin: 13.3 g/dL (ref 13.0–17.0)
Lymphocytes Relative: 4 % — ABNORMAL LOW (ref 12–46)
Lymphs Abs: 0.2 10*3/uL — ABNORMAL LOW (ref 0.7–4.0)
MCH: 32.7 pg (ref 26.0–34.0)
MCHC: 34.4 g/dL (ref 30.0–36.0)
MCV: 95.1 fL (ref 78.0–100.0)
MONOS PCT: 3 % (ref 3–12)
Monocytes Absolute: 0.1 10*3/uL (ref 0.1–1.0)
NEUTROS PCT: 92 % — AB (ref 43–77)
Neutro Abs: 3.7 10*3/uL (ref 1.7–7.7)
Platelets: 137 10*3/uL — ABNORMAL LOW (ref 150–400)
RBC: 4.07 MIL/uL — ABNORMAL LOW (ref 4.22–5.81)
RDW: 14.1 % (ref 11.5–15.5)
WBC: 4 10*3/uL (ref 4.0–10.5)

## 2014-05-24 LAB — I-STAT CG4 LACTIC ACID, ED: LACTIC ACID, VENOUS: 3.06 mmol/L — AB (ref 0.5–2.2)

## 2014-05-24 MED ORDER — SODIUM CHLORIDE 0.9 % IV BOLUS (SEPSIS)
1000.0000 mL | Freq: Once | INTRAVENOUS | Status: AC
  Administered 2014-05-24: 1000 mL via INTRAVENOUS

## 2014-05-24 NOTE — ED Provider Notes (Signed)
CSN: [id number]     Arrival date & time 05/24/14  2318 History  This chart was scribed for [employer]. Jeffery Chapel, MD by Randa Evens, ED Scribe. This patient was seen in room A09C/A09C and the patient's care was started at 11:23 PM.      Chief Complaint  Patient presents with  . Hypertension  . Fever   Patient is a 73 y.o. male presenting with hypertension. The history is provided by the patient and the EMS personnel. No language interpreter was used.  Hypertension Associated symptoms include shortness of breath.  HPI Comments: [name] is a 73 y.o. male with PMHx of HTN and PE brought in by ambulance, who presents to the Emergency Department complaining of hypertension onset 6:30 PM today. He states that he has associated chills, fever and SOB.  Pt states that he is feeling "tired." Pt denies cough, sore throat, nausea, vomiting or dysuria.  Pt states that he his currently on coumadin.    Past Medical History  Diagnosis Date  . Gout   . GERD (gastroesophageal reflux disease)   . Barrett's esophagus 2009  . FH: colonic polyps   . Arthritis   . Hypertension   . Prostate cancer 12/2011    Found on routine screening PSA, planning for implants by Dr. Sondra Come 10/16   . Pulmonary embolus 2005    Bilateral, coumadin x 1 years, inpatient for 5 days  . Subdural hematoma   . Infected sebaceous cyst back 7x5x4cm s/p I&D [id number] 01/11/2013  . History of radiation therapy 06/22/2012-08/18/2012    78 Gy to prostate  . AAA (abdominal aortic aneurysm)   . CAD (coronary artery disease)     Noted to have coronary calcification on CT  . Shortness of breath   . Pulmonary HTN 04/12/2014   Past Surgical History  Procedure Laterality Date  . Fracture left femur repair  09/2010    Went to [facility] x 3.5 weeks  . Tonsillectomy      as child.  . Craniotomy  04/03/2012    Procedure: CRANIOTOMY HEMATOMA EVACUATION SUBDURAL;  Surgeon: Erline Levine, MD;  Location: [facility];  Service:  Neurosurgery;  Laterality: Left;  Left Craniotomy for Evacuation of Subdural Hematoma  . Colonoscopy    . Polypectomy    . Abcessed cyst removed from back  01-11-13  . Cardiac catheterization  04/12/2014   Family History  Problem Relation Age of Onset  . Heart disease      No family history  . Macular degeneration Mother   . Osteoarthritis Mother   . Aneurysm Father     d/o 100 yo, ruptured AAA  . Colon cancer Neg Hx    History  Substance Use Topics  . Smoking status: Former Smoker -- 1.00 packs/day for 20 years    Types: Cigarettes    Quit date: 12/23/1971  . Smokeless tobacco: Never Used  . Alcohol Use: 3.6 oz/week    6 Cans of beer per week     Comment: 2 drinks per night, last drink was 03/29/2012, 6 pack of beer a week    Review of Systems  Constitutional: Positive for fever, chills and diaphoresis.  HENT: Negative for sore throat.   Respiratory: Positive for shortness of breath. Negative for cough.   Gastrointestinal: Negative for nausea and vomiting.  Genitourinary: Negative for dysuria.    Allergies  Review of patient's allergies indicates no known allergies.  Home Medications   Prior to Admission medications   Medication Sig  Start Date End Date Taking? Authorizing Provider  allopurinol (ZYLOPRIM) 300 MG tablet Take 300 mg by mouth daily.   Yes Historical Provider, MD  aspirin EC 81 MG tablet Take 1 tablet (81 mg total) by mouth daily. 04/11/14  Yes Lelon Perla, MD  omeprazole (PRILOSEC) 20 MG capsule Take 20 mg by mouth daily.   Yes Historical Provider, MD  warfarin (COUMADIN) 5 MG tablet Take 1 tablet (5 mg total) by mouth as directed. Patient taking differently: Take 10 mg by mouth as directed.  05/12/14  Yes Lelon Perla, MD   BP 109/80 mmHg  Pulse 100  Temp(Src) 97.8 F (36.6 C) (Oral)  Resp 18  Ht 6\' 1"  (1.854 m)  Wt 245 lb (111.131 kg)  BMI 32.33 kg/m2  SpO2 93%   Physical Exam  Constitutional: He is oriented to person, place, and time.  He appears well-developed and well-nourished. No distress.  Mild diaphoresis     HENT:  Head: Normocephalic and atraumatic.  Eyes: Conjunctivae and EOM are normal.  Neck: Neck supple. No tracheal deviation present.  Cardiovascular: Tachycardia present.   No murmur heard. Pulmonary/Chest: Tachypnea noted.  Musculoskeletal: Normal range of motion.  Bilateral pitting edema, petechia on lower legs  Neurological: He is alert and oriented to person, place, and time.  Skin: Skin is warm and dry.  Psychiatric: He has a normal mood and affect. His behavior is normal.  Nursing note and vitals reviewed.   ED Course  Procedures (including critical care time) Labs Review Labs Reviewed  CBC WITH DIFFERENTIAL - Abnormal; Notable for the following:    RBC 4.07 (*)    HCT 38.7 (*)    Platelets 137 (*)    Neutrophils Relative % 92 (*)    Lymphocytes Relative 4 (*)    Lymphs Abs 0.2 (*)    All other components within normal limits  COMPREHENSIVE METABOLIC PANEL - Abnormal; Notable for the following:    Glucose, Bld 132 (*)    Albumin 3.3 (*)    GFR calc non Af Amer 88 (*)    Anion gap 18 (*)    All other components within normal limits  URINALYSIS, ROUTINE W REFLEX MICROSCOPIC - Abnormal; Notable for the following:    Hgb urine dipstick TRACE (*)    Ketones, ur 15 (*)    All other components within normal limits  PROTIME-INR - Abnormal; Notable for the following:    Prothrombin Time 29.1 (*)    INR 2.72 (*)    All other components within normal limits  I-STAT CHEM 8, ED - Abnormal; Notable for the following:    Glucose, Bld 134 (*)    Calcium, Ion 1.00 (*)    All other components within normal limits  I-STAT CG4 LACTIC ACID, ED - Abnormal; Notable for the following:    Lactic Acid, Venous 3.06 (*)    All other components within normal limits  CULTURE, BLOOD (ROUTINE X 2)  CULTURE, BLOOD (ROUTINE X 2)  URINE MICROSCOPIC-ADD ON    Imaging Review Ct Angio Chest Pe W/cm &/or Wo  Cm  05/25/2014   CLINICAL DATA:  73 year old male with shortness of breath and hypoxia. Initial encounter.  EXAM: CT ANGIOGRAPHY CHEST WITH CONTRAST  TECHNIQUE: Multidetector CT imaging of the chest was performed using the standard protocol during bolus administration of intravenous contrast. Multiplanar CT image reconstructions and MIPs were obtained to evaluate the vascular anatomy.  CONTRAST:  70mL OMNIPAQUE IOHEXOL 350 MG/ML SOLN  COMPARISON:  04/12/2014 CT.  05/24/2014 and prior chest radiographs  FINDINGS: Pulmonary emboli bilaterally again identify but decreased clot burden stents 04/12/2014. Bilateral emboli are again visualized with the in the lingula, bilateral lower lobes and right middle lobe. No definite acute or new pulmonary emboli are identified.  Cardiomegaly is again noted.  There is no evidence of pleural or pericardial effusion.  No enlarged lymph nodes are identified. Mild to moderate bilateral lower lobe atelectasis now identified.  There is no evidence of airspace disease, nodule, masses or endobronchial/endotracheal lesion.  A large hiatal hernia is again noted.  A T11 vertebral body fracture is unchanged.  Review of the MIP images confirms the above findings.  IMPRESSION: Bilateral pulmonary emboli again noted with decreased clot burden since 04/12/2014. No new or acute pulmonary emboli identified.  New mild to moderate bilateral lower lobe subsegmental atelectasis.  Cardiomegaly and large hiatal hernia.  Unchanged T11 vertebral body fracture   Electronically Signed   By: Hassan Rowan M.D.   On: 05/25/2014 03:20   Dg Chest Portable 1 View  05/25/2014   CLINICAL DATA:  Initial evaluation for acute shortness of breath.  EXAM: PORTABLE CHEST - 1 VIEW  COMPARISON:  Prior radiograph from 04/06/2014.  FINDINGS: Cardiomegaly is stable from prior study. Mediastinal silhouette within normal limits. Convexity along the right heart border likely related to patient's hiatal hernia seen on prior CT.   Lungs are hypoinflated with mild bibasilar atelectasis. No focal infiltrate, pulmonary edema, or pleural effusion. No pneumothorax.  No acute osseus abnormality.  IMPRESSION: 1. Shallow lung inflation with mild bibasilar atelectasis. No other acute cardiopulmonary abnormality. 2. Stable cardiomegaly.   Electronically Signed   By: Jeannine Boga M.D.   On: 05/25/2014 00:31     EKG Interpretation   Date/Time:  Tuesday May 24 2014 23:27:41 EST Ventricular Rate:  133 PR Interval:  116 QRS Duration: 147 QT Interval:  402 QTC Calculation: 598 R Axis:   -95 Text Interpretation:  Sinus tachycardia RBBB and LAFB Confirmed by  Gianmarco Roye  MD, Ovid Curd [id number]) on 05/24/2014 11:36:31 PM      MDM   Final diagnoses:  SOB (shortness of breath)  Hypoxia  Fever, unspecified fever cause    Patient presented with fever and tachycardia along with some hypoxia. No clear source of fever. Has had recent pulmonary embolisms and is on Coumadin. CT angiography done to evaluate for recurrent PEs. CT scan is improved. Patient feels better and is not hypoxic anymore. He is ambulated without hypoxia. He will be discharged. Blood cultures are pending.   I personally performed the services described in this documentation, which was scribed in my presence. The recorded information has been reviewed and is accurate.  CRITICAL CARE Performed by: Mackie Pai Total critical care time: 30 Critical care time was exclusive of separately billable procedures and treating other patients. Critical care was necessary to treat or prevent imminent or life-threatening deterioration. Critical care was time spent personally by me on the following activities: development of treatment plan with patient and/or surrogate as well as nursing, discussions with consultants, evaluation of patient's response to treatment, examination of patient, obtaining history from patient or surrogate, ordering and performing treatments  and interventions, ordering and review of laboratory studies, ordering and review of radiographic studies, pulse oximetry and re-evaluation of patient's condition.      Jasper Riling. Jeffery Chapel, MD 05/25/14 [phone]

## 2014-05-24 NOTE — ED Notes (Signed)
Per ems-- called out for high bp. Hr 128 and o2 sat- 86% RA upon ems arrival. Pt c/o fever/chills at 630 tonight. Also reports increased urination over past 3 weeks. Hx PE. Denies cough, nvd. 20G IV L hand.

## 2014-05-25 ENCOUNTER — Emergency Department (HOSPITAL_COMMUNITY): Payer: Medicare Other

## 2014-05-25 LAB — COMPREHENSIVE METABOLIC PANEL
ALT: 9 U/L (ref 0–53)
AST: 15 U/L (ref 0–37)
Albumin: 3.3 g/dL — ABNORMAL LOW (ref 3.5–5.2)
Alkaline Phosphatase: 80 U/L (ref 39–117)
Anion gap: 18 — ABNORMAL HIGH (ref 5–15)
BUN: 14 mg/dL (ref 6–23)
CO2: 21 mEq/L (ref 19–32)
Calcium: 8.6 mg/dL (ref 8.4–10.5)
Chloride: 102 mEq/L (ref 96–112)
Creatinine, Ser: 0.75 mg/dL (ref 0.50–1.35)
GFR calc Af Amer: >90 mL/min (ref >90)
GFR calc non Af Amer: 88 mL/min — ABNORMAL LOW (ref >90)
Glucose, Bld: 132 mg/dL — ABNORMAL HIGH (ref 70–99)
Potassium: 4 mEq/L (ref 3.7–5.3)
Sodium: 141 mEq/L (ref 137–147)
Total Bilirubin: 0.5 mg/dL (ref 0.3–1.2)
Total Protein: 6.6 g/dL (ref 6.0–8.3)

## 2014-05-25 LAB — PROTIME-INR
INR: 2.72 — ABNORMAL HIGH (ref 0.00–1.49)
PROTHROMBIN TIME: 29.1 s — AB (ref 11.6–15.2)

## 2014-05-25 LAB — URINALYSIS, ROUTINE W REFLEX MICROSCOPIC
Bilirubin Urine: NEGATIVE
Glucose, UA: NEGATIVE mg/dL
Ketones, ur: 15 mg/dL — AB
Leukocytes, UA: NEGATIVE
Nitrite: NEGATIVE
Protein, ur: NEGATIVE mg/dL
Specific Gravity, Urine: 1.01 (ref 1.005–1.030)
Urobilinogen, UA: 1 mg/dL (ref 0.0–1.0)
pH: 7 (ref 5.0–8.0)

## 2014-05-25 LAB — URINE MICROSCOPIC-ADD ON

## 2014-05-25 MED ORDER — IOHEXOL 350 MG/ML SOLN
75.0000 mL | Freq: Once | INTRAVENOUS | Status: AC | PRN
  Administered 2014-05-25: 75 mL via INTRAVENOUS

## 2014-05-25 MED ORDER — ACETAMINOPHEN 325 MG PO TABS
650.0000 mg | ORAL_TABLET | Freq: Once | ORAL | Status: AC
  Administered 2014-05-25: 650 mg via ORAL
  Filled 2014-05-25: qty 2

## 2014-05-25 NOTE — Discharge Instructions (Signed)

## 2014-05-26 ENCOUNTER — Encounter (HOSPITAL_COMMUNITY): Payer: Self-pay | Admitting: Emergency Medicine

## 2014-05-26 ENCOUNTER — Telehealth (HOSPITAL_BASED_OUTPATIENT_CLINIC_OR_DEPARTMENT_OTHER): Payer: Self-pay | Admitting: Emergency Medicine

## 2014-05-26 ENCOUNTER — Emergency Department (HOSPITAL_COMMUNITY): Payer: Medicare Other

## 2014-05-26 ENCOUNTER — Inpatient Hospital Stay (HOSPITAL_COMMUNITY)
Admission: EM | Admit: 2014-05-26 | Discharge: 2014-05-27 | DRG: 872 | Disposition: A | Discharge status: Home / Self Care | Payer: Medicare Other | Attending: Internal Medicine | Admitting: Internal Medicine

## 2014-05-26 DIAGNOSIS — Z7901 Long term (current) use of anticoagulants: Secondary | ICD-10-CM | POA: Diagnosis not present

## 2014-05-26 DIAGNOSIS — Z86711 Personal history of pulmonary embolism: Secondary | ICD-10-CM

## 2014-05-26 DIAGNOSIS — Z87891 Personal history of nicotine dependence: Secondary | ICD-10-CM | POA: Diagnosis not present

## 2014-05-26 DIAGNOSIS — Z7982 Long term (current) use of aspirin: Secondary | ICD-10-CM | POA: Diagnosis not present

## 2014-05-26 DIAGNOSIS — M109 Gout, unspecified: Secondary | ICD-10-CM | POA: Diagnosis present

## 2014-05-26 DIAGNOSIS — I824Z9 Acute embolism and thrombosis of unspecified deep veins of unspecified distal lower extremity: Secondary | ICD-10-CM | POA: Diagnosis present

## 2014-05-26 DIAGNOSIS — I272 Other secondary pulmonary hypertension: Secondary | ICD-10-CM | POA: Diagnosis present

## 2014-05-26 DIAGNOSIS — I2510 Atherosclerotic heart disease of native coronary artery without angina pectoris: Secondary | ICD-10-CM | POA: Diagnosis present

## 2014-05-26 DIAGNOSIS — I10 Essential (primary) hypertension: Secondary | ICD-10-CM | POA: Diagnosis present

## 2014-05-26 DIAGNOSIS — R7881 Bacteremia: Principal | ICD-10-CM

## 2014-05-26 DIAGNOSIS — I824Z2 Acute embolism and thrombosis of unspecified deep veins of left distal lower extremity: Secondary | ICD-10-CM

## 2014-05-26 DIAGNOSIS — M1990 Unspecified osteoarthritis, unspecified site: Secondary | ICD-10-CM | POA: Diagnosis present

## 2014-05-26 DIAGNOSIS — B958 Unspecified staphylococcus as the cause of diseases classified elsewhere: Secondary | ICD-10-CM | POA: Diagnosis present

## 2014-05-26 DIAGNOSIS — I2699 Other pulmonary embolism without acute cor pulmonale: Secondary | ICD-10-CM

## 2014-05-26 DIAGNOSIS — Z8546 Personal history of malignant neoplasm of prostate: Secondary | ICD-10-CM | POA: Diagnosis not present

## 2014-05-26 DIAGNOSIS — Z86718 Personal history of other venous thrombosis and embolism: Secondary | ICD-10-CM

## 2014-05-26 DIAGNOSIS — K2270 Barrett's esophagus without dysplasia: Secondary | ICD-10-CM | POA: Diagnosis present

## 2014-05-26 DIAGNOSIS — D72819 Decreased white blood cell count, unspecified: Secondary | ICD-10-CM | POA: Diagnosis present

## 2014-05-26 DIAGNOSIS — D709 Neutropenia, unspecified: Secondary | ICD-10-CM | POA: Diagnosis present

## 2014-05-26 DIAGNOSIS — K219 Gastro-esophageal reflux disease without esophagitis: Secondary | ICD-10-CM | POA: Diagnosis present

## 2014-05-26 DIAGNOSIS — B9689 Other specified bacterial agents as the cause of diseases classified elsewhere: Secondary | ICD-10-CM

## 2014-05-26 DIAGNOSIS — A499 Bacterial infection, unspecified: Secondary | ICD-10-CM

## 2014-05-26 HISTORY — DX: Low back pain, unspecified: M54.50

## 2014-05-26 HISTORY — DX: Low back pain: M54.5

## 2014-05-26 HISTORY — DX: Other chronic pain: G89.29

## 2014-05-26 LAB — COMPREHENSIVE METABOLIC PANEL
ALT: 11 U/L (ref 0–53)
ANION GAP: 15 (ref 5–15)
AST: 17 U/L (ref 0–37)
Albumin: 3.5 g/dL (ref 3.5–5.2)
Alkaline Phosphatase: 75 U/L (ref 39–117)
BUN: 14 mg/dL (ref 6–23)
CALCIUM: 8.3 mg/dL — AB (ref 8.4–10.5)
CO2: 22 meq/L (ref 19–32)
CREATININE: 0.8 mg/dL (ref 0.50–1.35)
Chloride: 101 mEq/L (ref 96–112)
GFR calc Af Amer: >90 mL/min (ref >90)
GFR, EST NON AFRICAN AMERICAN: 86 mL/min — AB (ref >90)
GLUCOSE: 95 mg/dL (ref 70–99)
Potassium: 4.4 mEq/L (ref 3.7–5.3)
Sodium: 138 mEq/L (ref 137–147)
Total Bilirubin: 0.6 mg/dL (ref 0.3–1.2)
Total Protein: 6.8 g/dL (ref 6.0–8.3)

## 2014-05-26 LAB — URINALYSIS, ROUTINE W REFLEX MICROSCOPIC
Bilirubin Urine: NEGATIVE
Glucose, UA: NEGATIVE mg/dL
Ketones, ur: 15 mg/dL — AB
LEUKOCYTES UA: NEGATIVE
Nitrite: NEGATIVE
PROTEIN: NEGATIVE mg/dL
Specific Gravity, Urine: 1.013 (ref 1.005–1.030)
Urobilinogen, UA: 1 mg/dL (ref 0.0–1.0)
pH: 7 (ref 5.0–8.0)

## 2014-05-26 LAB — CBC WITH DIFFERENTIAL/PLATELET
Basophils Absolute: 0 10*3/uL (ref 0.0–0.1)
Basophils Relative: 0 % (ref 0–1)
EOS ABS: 0.1 10*3/uL (ref 0.0–0.7)
EOS PCT: 3 % (ref 0–5)
HEMATOCRIT: 38.6 % — AB (ref 39.0–52.0)
Hemoglobin: 12.7 g/dL — ABNORMAL LOW (ref 13.0–17.0)
LYMPHS ABS: 0.5 10*3/uL — AB (ref 0.7–4.0)
LYMPHS PCT: 15 % (ref 12–46)
MCH: 31.1 pg (ref 26.0–34.0)
MCHC: 32.9 g/dL (ref 30.0–36.0)
MCV: 94.4 fL (ref 78.0–100.0)
MONO ABS: 0.5 10*3/uL (ref 0.1–1.0)
Monocytes Relative: 12 % (ref 3–12)
Neutro Abs: 2.5 10*3/uL (ref 1.7–7.7)
Neutrophils Relative %: 70 % (ref 43–77)
Platelets: 138 10*3/uL — ABNORMAL LOW (ref 150–400)
RBC: 4.09 MIL/uL — ABNORMAL LOW (ref 4.22–5.81)
RDW: 14.3 % (ref 11.5–15.5)
WBC: 3.7 10*3/uL — AB (ref 4.0–10.5)

## 2014-05-26 LAB — PROTIME-INR
INR: 1.75 — ABNORMAL HIGH (ref 0.00–1.49)
PROTHROMBIN TIME: 20.6 s — AB (ref 11.6–15.2)

## 2014-05-26 LAB — APTT: APTT: 22 s — AB (ref 24–37)

## 2014-05-26 LAB — I-STAT CG4 LACTIC ACID, ED: Lactic Acid, Venous: 1.13 mmol/L (ref 0.5–2.2)

## 2014-05-26 LAB — MAGNESIUM: Magnesium: 2 mg/dL (ref 1.5–2.5)

## 2014-05-26 MED ORDER — PANTOPRAZOLE SODIUM 40 MG PO TBEC
40.0000 mg | DELAYED_RELEASE_TABLET | Freq: Every day | ORAL | Status: DC
  Administered 2014-05-27: 40 mg via ORAL
  Filled 2014-05-26 (×2): qty 1

## 2014-05-26 MED ORDER — SODIUM CHLORIDE 0.9 % IJ SOLN: 3.0000 mL | Freq: Two times a day (BID) | INTRAMUSCULAR | Status: DC

## 2014-05-26 MED ORDER — SODIUM CHLORIDE 0.9 % IV BOLUS (SEPSIS)
500.0000 mL | Freq: Once | INTRAVENOUS | Status: AC
  Administered 2014-05-26: 500 mL via INTRAVENOUS

## 2014-05-26 MED ORDER — ACETAMINOPHEN 325 MG PO TABS: 650.0000 mg | ORAL_TABLET | Freq: Four times a day (QID) | ORAL | Status: DC | PRN

## 2014-05-26 MED ORDER — ASPIRIN EC 81 MG PO TBEC
81.0000 mg | DELAYED_RELEASE_TABLET | Freq: Every day | ORAL | Status: DC
  Administered 2014-05-27: 81 mg via ORAL
  Filled 2014-05-26: qty 1

## 2014-05-26 MED ORDER — SENNOSIDES-DOCUSATE SODIUM 8.6-50 MG PO TABS: 1.0000 | ORAL_TABLET | Freq: Every evening | ORAL | Status: DC | PRN

## 2014-05-26 MED ORDER — ZOLPIDEM TARTRATE 5 MG PO TABS: 5.0000 mg | ORAL_TABLET | Freq: Every evening | ORAL | Status: DC | PRN

## 2014-05-26 MED ORDER — ALLOPURINOL 300 MG PO TABS
300.0000 mg | ORAL_TABLET | Freq: Every day | ORAL | Status: DC
  Administered 2014-05-27: 300 mg via ORAL
  Filled 2014-05-26: qty 1

## 2014-05-26 MED ORDER — HYDRALAZINE HCL 20 MG/ML IJ SOLN: 10.0000 mg | Freq: Four times a day (QID) | INTRAMUSCULAR | Status: DC | PRN

## 2014-05-26 MED ORDER — ALUM & MAG HYDROXIDE-SIMETH 200-200-20 MG/5ML PO SUSP: 30.0000 mL | Freq: Four times a day (QID) | ORAL | Status: DC | PRN

## 2014-05-26 MED ORDER — ONDANSETRON HCL 4 MG PO TABS
4.0000 mg | ORAL_TABLET | Freq: Four times a day (QID) | ORAL | Status: DC | PRN
Start: 2014-05-26 — End: 2014-05-27

## 2014-05-26 MED ORDER — SORBITOL 70 % SOLN: 30.0000 mL | Freq: Every day | Status: DC | PRN

## 2014-05-26 MED ORDER — VANCOMYCIN HCL IN DEXTROSE 1-5 GM/200ML-% IV SOLN
1000.0000 mg | Freq: Two times a day (BID) | INTRAVENOUS | Status: DC
  Administered 2014-05-27: 1000 mg via INTRAVENOUS
  Filled 2014-05-26 (×2): qty 200

## 2014-05-26 MED ORDER — WARFARIN SODIUM 5 MG PO TABS
5.0000 mg | ORAL_TABLET | ORAL | Status: DC
  Filled 2014-05-26: qty 1

## 2014-05-26 MED ORDER — AMLODIPINE BESYLATE 5 MG PO TABS
5.0000 mg | ORAL_TABLET | Freq: Every day | ORAL | Status: DC
  Administered 2014-05-26: 5 mg via ORAL
  Filled 2014-05-26 (×2): qty 1

## 2014-05-26 MED ORDER — VANCOMYCIN HCL 10 G IV SOLR
2000.0000 mg | INTRAVENOUS | Status: AC
  Administered 2014-05-26: 2000 mg via INTRAVENOUS
  Filled 2014-05-26: qty 2000

## 2014-05-26 MED ORDER — OXYCODONE HCL 5 MG PO TABS: 5.0000 mg | ORAL_TABLET | ORAL | Status: DC | PRN

## 2014-05-26 MED ORDER — SODIUM CHLORIDE 0.9 % IV SOLN
INTRAVENOUS | Status: DC
  Administered 2014-05-26: 16:00:00 via INTRAVENOUS

## 2014-05-26 MED ORDER — ONDANSETRON HCL 4 MG/2ML IJ SOLN: 4.0000 mg | Freq: Four times a day (QID) | INTRAMUSCULAR | Status: DC | PRN

## 2014-05-26 MED ORDER — MAGNESIUM CITRATE PO SOLN
1.0000 | Freq: Once | ORAL | Status: AC | PRN
  Filled 2014-05-26: qty 296

## 2014-05-26 MED ORDER — WARFARIN - PHARMACIST DOSING INPATIENT: Freq: Every day | Status: DC

## 2014-05-26 MED ORDER — ACETAMINOPHEN 650 MG RE SUPP: 650.0000 mg | Freq: Four times a day (QID) | RECTAL | Status: DC | PRN

## 2014-05-26 MED ORDER — SODIUM CHLORIDE 0.9 % IV SOLN
INTRAVENOUS | Status: AC
  Administered 2014-05-26: 100 mL/h via INTRAVENOUS

## 2014-05-26 NOTE — Plan of Care (Signed)
Problem: Consults Goal: Skin Care Protocol Initiated - if Braden Score 18 or less If consults are not indicated, leave blank or document N/A Outcome: Not Applicable Date Met:  13/88/71

## 2014-05-26 NOTE — Telephone Encounter (Signed)
Post ED Visit - Positive Culture Follow-up: Successful Patient Follow-Up  Culture assessed and recommendations reviewed by: []  Wes [place], Pharm.D., BCPS []  Heide Guile, Pharm.D., BCPS [x]  Alycia Rossetti, Pharm.D., BCPS []  [place], Pharm.D., BCPS, AAHIVP []  Legrand [place]o, Pharm.D., BCPS, AAHIVP []  Hassie Bruce, Pharm.D. []  Milus Glazier, Pharm.D.  Positive blood culture + cocci in clusters  [x]  Patient discharged without antimicrobial prescription and treatment is now indicated []  Organism is resistant to prescribed ED discharge antimicrobial [x]  Patient with positive blood cultures  Changes discussed with ED provider: Audie Pinto  New antibiotic prescription none  Contacted patient, date 05/26/2014 1222, spoke with wife, Bethanne Ginger, and made aware that pt needs to return to the ER today for possible admit for antibiotics, verbalizes understanding   Hazle Nordmann 05/26/2014, 12:24 PM

## 2014-05-26 NOTE — H&P (Signed)
[facility] History and Physical  [name] PRF:[id number] DOB: 06-Feb-1941 DOA: 05/26/2014  Referring physician: EDP PCP: Horatio Pel, MD   Chief Complaint: Gram pos bacteremia  HPI: [name] is a 73 y.o. male with past medical history of bilateral pulmonary embolism X2 ( first in 2005, second inOctober 2015 with a DVT, on chronic Coumadin), coronary artery disease status post catheterization, hypertension, history of prostate cancer, history of AAA, arthritis, gout, GERD, that return to ED as instructed by EDP after blood cultures with gram-positive bacteremia. Patient was in the hospital 05/24/2014 for evaluation of fever and chills, but was discharged after lab were all WNL and no evidence of infection identified. Blood cultures 06/05/2014 have come back with gram positive bacteremia. Patient states he was well at home denies any fever, chills, chest pain, shortness of breath, cough, abdominal pain, nausea, vomiting, diarrhea, dysuria,  burning with urination, or lower extremity edema. Wife states that she has noticed increased in urgency. Patient states that left lower extremity with chronic swelling after previous fracture.  In the ED, evidence of  Review of Systems:  Constitutional:  No weight loss, night sweats, Fevers, chills, fatigue.  HEENT:  No headaches, Difficulty swallowing,Tooth/dental problems,Sore throat,  No sneezing, itching, ear ache, nasal congestion, post nasal drip,  Cardio-vascular:  No chest pain, Orthopnea, PND, swelling in lower extremities, anasarca, dizziness, palpitations  GI:  No heartburn, indigestion, abdominal pain, nausea, vomiting, diarrhea, change in bowel habits, loss of appetite  Resp:  No shortness of breath with exertion or at rest. No excess mucus, no productive cough, No non-productive cough, No coughing up of blood.No change in color of mucus.No wheezing.No chest wall deformity  Skin:  no rash or lesions.  GU:  no  dysuria, change in color of urine, no urgency or frequency. No flank pain.  Musculoskeletal:  No joint pain or swelling. No decreased range of motion. No back pain.  Psych:  No change in mood or affect. No depression or anxiety. No memory loss.   Past Medical History  Diagnosis Date  . Gout   . GERD (gastroesophageal reflux disease)   . Barrett's esophagus 2009  . FH: colonic polyps   . Arthritis   . Hypertension   . Prostate cancer 12/2011    Found on routine screening PSA, planning for implants by Dr. Sondra Come 10/16   . Pulmonary embolus 2005    Bilateral, coumadin x 1 years, inpatient for 5 days  . Subdural hematoma   . Infected sebaceous cyst back 7x5x4cm s/p I&D [id number] 01/11/2013  . History of radiation therapy 06/22/2012-08/18/2012    78 Gy to prostate  . AAA (abdominal aortic aneurysm)   . CAD (coronary artery disease)     Noted to have coronary calcification on CT  . Shortness of breath   . Pulmonary HTN 04/12/2014   Past Surgical History  Procedure Laterality Date  . Fracture left femur repair  09/2010    Went to [facility] x 3.5 weeks  . Tonsillectomy      as child.  . Craniotomy  04/03/2012    Procedure: CRANIOTOMY HEMATOMA EVACUATION SUBDURAL;  Surgeon: Erline Levine, MD;  Location: [facility];  Service: Neurosurgery;  Laterality: Left;  Left Craniotomy for Evacuation of Subdural Hematoma  . Colonoscopy    . Polypectomy    . Abcessed cyst removed from back  01-11-13  . Cardiac catheterization  04/12/2014   Social History:  reports that he quit smoking about 42 years ago. His smoking  use included Cigarettes. He has a 20 pack-year smoking history. He has never used smokeless tobacco. He reports that he drinks about 3.6 oz of alcohol per week. He reports that he does not use illicit drugs.  No Known Allergies  Family History  Problem Relation Age of Onset  . Heart disease      No family history  . Macular degeneration Mother   . Osteoarthritis Mother   .  Aneurysm Father     d/o 73 yo, ruptured AAA  . Colon cancer Neg Hx      Prior to Admission medications   Medication Sig Start Date End Date Taking? Authorizing Provider  allopurinol (ZYLOPRIM) 300 MG tablet Take 300 mg by mouth daily.   Yes Historical Provider, MD  aspirin EC 81 MG tablet Take 1 tablet (81 mg total) by mouth daily. 04/11/14  Yes Lelon Perla, MD  omeprazole (PRILOSEC) 20 MG capsule Take 20 mg by mouth daily.   Yes Historical Provider, MD  warfarin (COUMADIN) 5 MG tablet Take 1 tablet (5 mg total) by mouth as directed. Patient taking differently: Take 10 mg by mouth as directed.  05/12/14  Yes Lelon Perla, MD   Physical Exam: Filed Vitals:   05/26/14 1346 05/26/14 1422 05/26/14 1507 05/26/14 1600  BP: 163/91 160/92 162/78 162/78  Pulse: 88 78 71 75  Temp: 98.1 F (36.7 C)     TempSrc: Oral     Resp: 18 12 16 22   SpO2: 95% 97% 98% 99%    Wt Readings from Last 3 Encounters:  05/24/14 111.131 kg (245 lb)  05/12/14 112.22 kg (247 lb 6.4 oz)  04/18/14 108.9 kg (240 lb 1.3 oz)    General:  Alert Caucasian male in NAD.   Eyes: PERRL, normal lids, irises & conjunctiva ENT: grossly normal hearing, lips & tongue Neck: no LAD, masses or thyromegaly Cardiovascular: RRR, no m/r/g.  Respiratory: CTA bilaterally, no w/r/r. Normal respiratory effort. Abdomen: soft, ntnd Skin: no rash or induration seen on limited exam Musculoskeletal: grossly normal tone BUE/BLE.  Leg leg with 1+ edema  Psychiatric: grossly normal mood and affect, speech fluent and appropriate Neurologic: grossly non-focal.          Labs on Admission:  Basic Metabolic Panel:  Recent Labs Lab 05/24/14 2330 05/24/14 2352 05/26/14 1355  NA 141 139 138  K 4.0 3.8 4.4  CL 102 105 101  CO2 21  --  22  GLUCOSE 132* 134* 95  BUN 14 13 14   CREATININE 0.75 0.80 0.80  CALCIUM 8.6  --  8.3*   Liver Function Tests:  Recent Labs Lab 05/24/14 2330 05/26/14 1355  AST 15 17  ALT 9 11    ALKPHOS 80 75  BILITOT 0.5 0.6  PROT 6.6 6.8  ALBUMIN 3.3* 3.5   No results for input(s): LIPASE, AMYLASE in the last 168 hours. No results for input(s): AMMONIA in the last 168 hours. CBC:  Recent Labs Lab 05/24/14 2330 05/24/14 2352 05/26/14 1355  WBC 4.0  --  3.7*  NEUTROABS 3.7  --  2.5  HGB 13.3 13.6 12.7*  HCT 38.7* 40.0 38.6*  MCV 95.1  --  94.4  PLT 137*  --  138*   Cardiac Enzymes: No results for input(s): CKTOTAL, CKMB, CKMBINDEX, TROPONINI in the last 168 hours.  BNP (last 3 results) No results for input(s): PROBNP in the last 8760 hours. CBG: No results for input(s): GLUCAP in the last 168 hours.  Radiological Exams  on Admission: Dg Chest 2 View  05/26/2014   CLINICAL DATA:  Positive blood cultures.  Prostate cancer  EXAM: CHEST  2 VIEW  COMPARISON:  05/24/2014  FINDINGS: Cardiac enlargement without heart failure. Mild atelectasis in the lung bases. This is unchanged. Negative for effusion.  Compression fracture T11 is unchanged from prior CT scans and may be a subacute fracture.  IMPRESSION: Bibasilar atelectasis unchanged.  No new findings.   Electronically Signed   By: Franchot Gallo M.D.   On: 05/26/2014 15:19   Ct Angio Chest Pe W/cm &/or Wo Cm  05/25/2014   CLINICAL DATA:  73 year old male with shortness of breath and hypoxia. Initial encounter.  EXAM: CT ANGIOGRAPHY CHEST WITH CONTRAST  TECHNIQUE: Multidetector CT imaging of the chest was performed using the standard protocol during bolus administration of intravenous contrast. Multiplanar CT image reconstructions and MIPs were obtained to evaluate the vascular anatomy.  CONTRAST:  39mL OMNIPAQUE IOHEXOL 350 MG/ML SOLN  COMPARISON:  04/12/2014 CT.  05/24/2014 and prior chest radiographs  FINDINGS: Pulmonary emboli bilaterally again identify but decreased clot burden stents 04/12/2014. Bilateral emboli are again visualized with the in the lingula, bilateral lower lobes and right middle lobe. No definite acute or  new pulmonary emboli are identified.  Cardiomegaly is again noted.  There is no evidence of pleural or pericardial effusion.  No enlarged lymph nodes are identified. Mild to moderate bilateral lower lobe atelectasis now identified.  There is no evidence of airspace disease, nodule, masses or endobronchial/endotracheal lesion.  A large hiatal hernia is again noted.  A T11 vertebral body fracture is unchanged.  Review of the MIP images confirms the above findings.  IMPRESSION: Bilateral pulmonary emboli again noted with decreased clot burden since 04/12/2014. No new or acute pulmonary emboli identified.  New mild to moderate bilateral lower lobe subsegmental atelectasis.  Cardiomegaly and large hiatal hernia.  Unchanged T11 vertebral body fracture   Electronically Signed   By: Hassan Rowan M.D.   On: 05/25/2014 03:20   Dg Chest Portable 1 View  05/25/2014   CLINICAL DATA:  Initial evaluation for acute shortness of breath.  EXAM: PORTABLE CHEST - 1 VIEW  COMPARISON:  Prior radiograph from 04/06/2014.  FINDINGS: Cardiomegaly is stable from prior study. Mediastinal silhouette within normal limits. Convexity along the right heart border likely related to patient's hiatal hernia seen on prior CT.  Lungs are hypoinflated with mild bibasilar atelectasis. No focal infiltrate, pulmonary edema, or pleural effusion. No pneumothorax.  No acute osseus abnormality.  IMPRESSION: 1. Shallow lung inflation with mild bibasilar atelectasis. No other acute cardiopulmonary abnormality. 2. Stable cardiomegaly.   Electronically Signed   By: Jeannine Boga M.D.   On: 05/25/2014 00:31    EKG: Independently reviewed. Sinus Tachycaria, RBB, LAFB  Assessment/Plan Principal Problem:   Bacteremia due to Gram-positive bacteria Active Problems:   Gout   GERD   Hypertension   CAD (coronary artery disease)   Bilateral pulmonary embolism   DVT, lower extremity, distal, acute   Positive blood cultures   Leukopenia    Neutropenia   Gram Positive Bacteremia -Placed on IV Vancomycin -LA normal -ID consult pending -Repeat blood cxs, UA, urine culture pending -2D echo to evaluate for vegetation  Hx of Bilateral Pulmonary Embolism -Denies SOB or chest pain -Recheck INR / PTT pending -On chronic Coumadin, Coumadin per pharmacy -Monitor on tele  CAD -denies chest pain -Continue ASA 325 daily  Gout -Continue Allopurinol  GERD -Continue Protonix    Infectious  Disease Consulted by Dr.  Grandville Silos  Code Status: Full DVT Prophylaxis: On Coumadin for Bil PE Family Communication: Wife at bedside Disposition Plan: Inpatient  Time spent: [place] Pager [phone]

## 2014-05-26 NOTE — Progress Notes (Signed)
ED Antimicrobial Stewardship Positive Culture Follow Up   [name] is an 73 y.o. male who presented to [facility] on 05/24/2014 with a chief complaint of fever, chills, SOB, tachycardia  Chief Complaint  Patient presents with  . Hypertension  . Fever    Recent Results (from the past 720 hour(s))  Blood culture (routine x 2)     Status: None (Preliminary result)   Collection Time: 05/24/14 11:32 PM  Result Value Ref Range Status   Specimen Description BLOOD LEFT ARM  Final   Special Requests BOTTLES DRAWN AEROBIC ONLY 4CC  Final   Culture  Setup Time   Final    05/25/2014 05:22 Performed at [facility]    Culture   Final    [place] IN CLUSTERS Note: Gram Stain Report Called to,Read Back By and Verified With: PAM MILLER 05/26/14 0938 BY PEAKY Performed at [facility]    Report Status PENDING  Incomplete  Blood culture (routine x 2)     Status: None (Preliminary result)   Collection Time: 05/24/14 11:38 PM  Result Value Ref Range Status   Specimen Description BLOOD LEFT HAND  Final   Special Requests BOTTLES DRAWN AEROBIC AND ANAEROBIC 5CC  Final   Culture  Setup Time   Final    05/25/2014 05:25 Performed at [facility]    Culture   Final           BLOOD CULTURE RECEIVED NO GROWTH TO DATE CULTURE WILL BE HELD FOR 5 DAYS BEFORE ISSUING A FINAL NEGATIVE REPORT Performed at [facility]    Report Status PENDING  Incomplete    [x]  Needs additional follow-up  57 YOM who presented on 12/1 with fever, chills, SOB, and tachycardia. LA 3.06, Afebrile, WBC WNL, UA negative. One blood culture is now growing GPC in clusters. Lab was called regarding the status of additional blood cultures - these were just incubated and will not have viable results until 12/4.  The patient was discussed with the ED-PA, given the patient's symptoms and unclear knowledge of if this is growing in additional blood cultures - the recommendation was made to  bring the patient back in for evaluation.  Additional Follow-Up: The patient needs to come back in for repeat blood cultures and further evaluation.  ED Provider: Carlisle Cater, PA-C  Lawson Radar 05/26/2014, 11:42 AM Infectious Diseases Pharmacist Phone# [phone]

## 2014-05-26 NOTE — ED Notes (Signed)
Pt here after receiving call that he had positive blood cultures; report sts gram positive cocci; pt sts generalized weakness and some fever at times

## 2014-05-26 NOTE — ED Notes (Signed)
Patient transported to X-ray 

## 2014-05-26 NOTE — ED Provider Notes (Signed)
CSN: [id number]     Arrival date & time 05/26/14  1331 History   First MD Initiated Contact with Patient 05/26/14 1428     Chief Complaint  Patient presents with  . Abnormal Lab     (Consider location/radiation/quality/duration/timing/severity/associated sxs/prior Treatment) HPI   PCP: Dr. Thayer Jew, [facility]  Patient to the ED as recommended by EDP after receiving a culture report that the patient has staph in blood cultures that were drawn on 05/24/2014, just two days ago. He presents with fever, hypoxia, tachycardia and SOB. He was sent home feeling better. Since then he has still felt poorly but reports feeling better today aside from "pissing vinegar" which he says means he has been in a very bad mood. He has not continued with cough. He has had intermittent low grade fevers. His vital signs are WNL initial during this visit.   PMH: + for gout, GERD< barrett's esophagus, arthritis, hypertension, prostate cancer, PE, AAA, CAD, SOB and pulmonary HTN.  Past Medical History  Diagnosis Date  . Gout   . GERD (gastroesophageal reflux disease)   . Barrett's esophagus 2009  . FH: colonic polyps   . Arthritis   . Hypertension   . Prostate cancer 12/2011    Found on routine screening PSA, planning for implants by Dr. Sondra Come 10/16   . Pulmonary embolus 2005    Bilateral, coumadin x 1 years, inpatient for 5 days  . Subdural hematoma   . Infected sebaceous cyst back 7x5x4cm s/p I&D [id number] 01/11/2013  . History of radiation therapy 06/22/2012-08/18/2012    78 Gy to prostate  . AAA (abdominal aortic aneurysm)   . CAD (coronary artery disease)     Noted to have coronary calcification on CT  . Shortness of breath   . Pulmonary HTN 04/12/2014   Past Surgical History  Procedure Laterality Date  . Fracture left femur repair  09/2010    Went to [facility] x 3.5 weeks  . Tonsillectomy      as child.  . Craniotomy  04/03/2012    Procedure: CRANIOTOMY HEMATOMA EVACUATION SUBDURAL;  Surgeon:  Erline Levine, MD;  Location: [facility];  Service: Neurosurgery;  Laterality: Left;  Left Craniotomy for Evacuation of Subdural Hematoma  . Colonoscopy    . Polypectomy    . Abcessed cyst removed from back  01-11-13  . Cardiac catheterization  04/12/2014   Family History  Problem Relation Age of Onset  . Heart disease      No family history  . Macular degeneration Mother   . Osteoarthritis Mother   . Aneurysm Father     d/o 85 yo, ruptured AAA  . Colon cancer Neg Hx    History  Substance Use Topics  . Smoking status: Former Smoker -- 1.00 packs/day for 20 years    Types: Cigarettes    Quit date: 12/23/1971  . Smokeless tobacco: Never Used  . Alcohol Use: 3.6 oz/week    6 Cans of beer per week     Comment: 2 drinks per night, last drink was 03/29/2012, 6 pack of beer a week    Review of Systems  10 Systems reviewed and are negative for acute change except as noted in the HPI.    Allergies  Review of patient's allergies indicates no known allergies.  Home Medications   Prior to Admission medications   Medication Sig Start Date End Date Taking? Authorizing Provider  allopurinol (ZYLOPRIM) 300 MG tablet Take 300 mg by mouth daily.  Historical Provider, MD  aspirin EC 81 MG tablet Take 1 tablet (81 mg total) by mouth daily. 04/11/14   Lelon Perla, MD  omeprazole (PRILOSEC) 20 MG capsule Take 20 mg by mouth daily.    Historical Provider, MD  warfarin (COUMADIN) 5 MG tablet Take 1 tablet (5 mg total) by mouth as directed. Patient taking differently: Take 10 mg by mouth as directed.  05/12/14   Lelon Perla, MD   BP 160/92 mmHg  Pulse 78  Temp(Src) 98.1 F (36.7 C) (Oral)  Resp 12  SpO2 97% Physical Exam  Constitutional: He appears well-developed and well-nourished. No distress.  HENT:  Head: Normocephalic and atraumatic.  Eyes: Pupils are equal, round, and reactive to light.  Neck: Normal range of motion. Neck supple.  Cardiovascular: Normal rate and  regular rhythm.   Pulmonary/Chest: Effort normal. No respiratory distress. He has no wheezes. He has no rales.  Abdominal: Soft.  Neurological: He is alert.  Skin: Skin is warm and dry.  Nursing note and vitals reviewed.   ED Course  Procedures (including critical care time) Labs Review Labs Reviewed  CBC WITH DIFFERENTIAL - Abnormal; Notable for the following:    WBC 3.7 (*)    RBC 4.09 (*)    Hemoglobin 12.7 (*)    HCT 38.6 (*)    Platelets 138 (*)    Lymphs Abs 0.5 (*)    All other components within normal limits  COMPREHENSIVE METABOLIC PANEL  URINALYSIS, ROUTINE W REFLEX MICROSCOPIC  I-STAT CG4 LACTIC ACID, ED    Imaging Review Ct Angio Chest Pe W/cm &/or Wo Cm  05/25/2014   CLINICAL DATA:  73 year old male with shortness of breath and hypoxia. Initial encounter.  EXAM: CT ANGIOGRAPHY CHEST WITH CONTRAST  TECHNIQUE: Multidetector CT imaging of the chest was performed using the standard protocol during bolus administration of intravenous contrast. Multiplanar CT image reconstructions and MIPs were obtained to evaluate the vascular anatomy.  CONTRAST:  62mL OMNIPAQUE IOHEXOL 350 MG/ML SOLN  COMPARISON:  04/12/2014 CT.  05/24/2014 and prior chest radiographs  FINDINGS: Pulmonary emboli bilaterally again identify but decreased clot burden stents 04/12/2014. Bilateral emboli are again visualized with the in the lingula, bilateral lower lobes and right middle lobe. No definite acute or new pulmonary emboli are identified.  Cardiomegaly is again noted.  There is no evidence of pleural or pericardial effusion.  No enlarged lymph nodes are identified. Mild to moderate bilateral lower lobe atelectasis now identified.  There is no evidence of airspace disease, nodule, masses or endobronchial/endotracheal lesion.  A large hiatal hernia is again noted.  A T11 vertebral body fracture is unchanged.  Review of the MIP images confirms the above findings.  IMPRESSION: Bilateral pulmonary emboli again  noted with decreased clot burden since 04/12/2014. No new or acute pulmonary emboli identified.  New mild to moderate bilateral lower lobe subsegmental atelectasis.  Cardiomegaly and large hiatal hernia.  Unchanged T11 vertebral body fracture   Electronically Signed   By: Hassan Rowan M.D.   On: 05/25/2014 03:20   Dg Chest Portable 1 View  05/25/2014   CLINICAL DATA:  Initial evaluation for acute shortness of breath.  EXAM: PORTABLE CHEST - 1 VIEW  COMPARISON:  Prior radiograph from 04/06/2014.  FINDINGS: Cardiomegaly is stable from prior study. Mediastinal silhouette within normal limits. Convexity along the right heart border likely related to patient's hiatal hernia seen on prior CT.  Lungs are hypoinflated with mild bibasilar atelectasis. No focal infiltrate, pulmonary edema,  or pleural effusion. No pneumothorax.  No acute osseus abnormality.  IMPRESSION: 1. Shallow lung inflation with mild bibasilar atelectasis. No other acute cardiopulmonary abnormality. 2. Stable cardiomegaly.   Electronically Signed   By: Jeannine Boga M.D.   On: 05/25/2014 00:31     EKG Interpretation None      MDM   Final diagnoses:  Positive blood cultures    I spoke with Dr. Grandville Silos from [facility] regarding the patients positive blood cultures showing staff. He was seen [place] few days ago with fevers, SOB and hypoxia, cultures were drawn and he was sent home. The cultures have come back positive. He is feeling over all better but still having transient low grade fevers. Pt started on fluids.abx pending repeat blood cultures.  Fortunately pt is feeling better but blood cultures will be redone. Will have patient admitted pending blod cultures and further work-up. In the ED his lactic acid has improved, his CBC and CMP are near or improved from 05/24/2014,  Inpatient, telemetry, [place], ADMITS   Linus Mako, PA-C 05/26/14 [place], MD 05/28/14 [phone]

## 2014-05-26 NOTE — Progress Notes (Signed)
Admission note:  Arrival Method: Patient came in stretcher with staff accompanying. Mental Orientation:  Alert and oriented x 4. Telemetry: He is on box 6E-10,  CCMD notified. Assessment: See doc Flow sheets Skin: Warm, dry and intact. IV: He has IV access on Left wrist, infusing. Pain: Denies any pain for now. Fall Prevention Safety Plan: Patient educated about fall prevention safety plan, understood and acknowledged. Admission Screening: In progress 6700 Orientation: Patient has been oriented to the unit, staff and to the room.

## 2014-05-26 NOTE — Consult Note (Signed)
[facility]  Date of Admission:  05/26/2014  Date of Consult:  05/26/2014  Reason for Consult: Bacteremia Referring Physician: Thompson  Impression/Recommendation GPC bacteremia, occult Recent PE (1.5 months ago per pt) HTN Prev L leg fracture with hardware  Would Repeat BCx Check echo, TEE if staph.  Continue vanco  Comment- I am hopeful that the 1/2 BCx is not a contaminant (CNS).  Appreciate Dr Biagio Borg excellent care, as well as ID pharmacy's commitment to excellent care.   Thank you so much for this interesting consult,   Bobby Rumpf (pager) [phone] [email or web address]  [name] is an 73 y.o. male.  HPI: 73 yo M with hx of PE 10-15, prostate CA, who came to ED on 12-1 for worsening SOB, fever/chills at home, polyuria. He was initially hypoxic (SpO2 86%) and afebrile in ED. While in ED he had temp 103.3. He underwent CTA which did not reveal acute abnormality. He had BCx drawn which have since resulted 1/2 GPC. He was called by pharmacy and asked to return to hospital today. He is afebrile, with mild leukopenia (3.7 with ANC 2.5)  Past Medical History  Diagnosis Date  . Gout   . GERD (gastroesophageal reflux disease)   . Barrett's esophagus 2009  . FH: colonic polyps   . Hypertension   . Pulmonary embolus 2005; 05/2014    Bilateral, coumadin x 1 years, inpatient for 5 days; hospitalized  . Subdural hematoma   . Infected sebaceous cyst back 7x5x4cm s/p I&D [id number] 01/11/2013  . History of radiation therapy 06/22/2012-08/18/2012    78 Gy to prostate  . AAA (abdominal aortic aneurysm)   . CAD (coronary artery disease)     Noted to have coronary calcification on CT  . Shortness of breath   . Pulmonary HTN 04/12/2014  . DVT (deep venous thrombosis) 05/2014    LLE  . Chronic lower back pain   . Prostate cancer 12/2011    Found on routine screening PSA, planning for implants by Dr. Sondra Come 10/16   . Colon cancer    "got it all w/colonoscopy"  . Arthritis     "back" (05/26/2014)    Past Surgical History  Procedure Laterality Date  . Craniotomy  04/03/2012    Procedure: CRANIOTOMY HEMATOMA EVACUATION SUBDURAL;  Surgeon: Erline Levine, MD;  Location: [facility];  Service: Neurosurgery;  Laterality: Left;  Left Craniotomy for Evacuation of Subdural Hematoma  . Colonoscopy    . Polypectomy    . Irrigation and debridement sebaceous cyst  01-11-13    "off my back"  . Cardiac catheterization  04/12/2014  . Tonsillectomy  1940's  . Fracture surgery    . Orif hip fracture Left 09/2010    Archie Endo 10/03/2010  . Rigid esophagoscopy  04/2008    food impaction/notes 06/13/2008  . Brain surgery      /notes 04/03/2012     No Known Allergies  Medications:  Scheduled: . [START ON 05/27/2014] allopurinol  300 mg Oral Daily  . amLODipine  5 mg Oral Daily  . [START ON 05/27/2014] aspirin EC  81 mg Oral Daily  . [START ON 05/27/2014] pantoprazole  40 mg Oral Daily  . sodium chloride  3 mL Intravenous Q12H  . vancomycin (VANCOCIN) IVPB  2,000 mg Intravenous NOW  . [START ON 05/27/2014] vancomycin  1,000 mg Intravenous Q12H    Abtx:  Anti-infectives    Start     Dose/Rate Route Frequency Ordered Stop   05/27/14 0700  vancomycin (VANCOCIN) IVPB 1000 mg/200 mL premix     1,000 mg200 mL/hr over 60 Minutes Intravenous Every 12 hours 05/26/14 1836     05/26/14 1900  vancomycin (VANCOCIN) 2,000 mg in sodium chloride 0.9 % 500 mL IVPB     2,000 mg250 mL/hr over 120 Minutes Intravenous NOW 05/26/14 1833 05/27/14 1900      Total days of antibiotics 0 (vanco)          Social History:  reports that he quit smoking about 42 years ago. His smoking use included Cigarettes. He has a 20 pack-year smoking history. He has never used smokeless tobacco. He reports that he drinks about 8.4 oz of alcohol per week. He reports that he does not use illicit drugs.  Family History  Problem Relation Age of Onset  . Heart disease       No family history  . Macular degeneration Mother   . Osteoarthritis Mother   . Aneurysm Father     d/o 26 yo, ruptured AAA  . Colon cancer Neg Hx     General ROS: denies f/c, no change in BM, no dysuria, + frequency, no CP, no SOB, mild chronic swelling L thigh/leg, see HPI.   Blood pressure 188/97, pulse 75, temperature 98.2 F (36.8 C), temperature source Oral, resp. rate 23, height '6\' 1"'  (1.854 m), weight 112.401 kg (247 lb 12.8 oz), SpO2 98 %. General appearance: alert, cooperative and no distress Eyes: negative findings: pupils equal, round, reactive to light and accomodation Throat: normal findings: oropharynx pink & moist without lesions or evidence of thrush Neck: no adenopathy and supple, symmetrical, trachea midline Lungs: clear to auscultation bilaterally Heart: regular rate and rhythm Abdomen: normal findings: bowel sounds normal and soft, non-tender Extremities: edema none at ankle and mild in LLE   Results for orders placed or performed during the hospital encounter of 05/26/14 (from the past 48 hour(s))  CBC with Differential     Status: Abnormal   Collection Time: 05/26/14  1:55 PM  Result Value Ref Range   WBC 3.7 (L) 4.0 - 10.5 K/uL   RBC 4.09 (L) 4.22 - 5.81 MIL/uL   Hemoglobin 12.7 (L) 13.0 - 17.0 g/dL   HCT 38.6 (L) 39.0 - 52.0 %   MCV 94.4 78.0 - 100.0 fL   MCH 31.1 26.0 - 34.0 pg   MCHC 32.9 30.0 - 36.0 g/dL   RDW 14.3 11.5 - 15.5 %   Platelets 138 (L) 150 - 400 K/uL   Neutrophils Relative % 70 43 - 77 %   Neutro Abs 2.5 1.7 - 7.7 K/uL   Lymphocytes Relative 15 12 - 46 %   Lymphs Abs 0.5 (L) 0.7 - 4.0 K/uL   Monocytes Relative 12 3 - 12 %   Monocytes Absolute 0.5 0.1 - 1.0 K/uL   Eosinophils Relative 3 0 - 5 %   Eosinophils Absolute 0.1 0.0 - 0.7 K/uL   Basophils Relative 0 0 - 1 %   Basophils Absolute 0.0 0.0 - 0.1 K/uL  Comprehensive metabolic panel     Status: Abnormal   Collection Time: 05/26/14  1:55 PM  Result Value Ref Range   Sodium 138  137 - 147 mEq/L   Potassium 4.4 3.7 - 5.3 mEq/L   Chloride 101 96 - 112 mEq/L   CO2 22 19 - 32 mEq/L   Glucose, Bld 95 70 - 99 mg/dL   BUN 14 6 - 23 mg/dL   Creatinine, Ser 0.80 0.50 - 1.35  mg/dL   Calcium 8.3 (L) 8.4 - 10.5 mg/dL   Total Protein 6.8 6.0 - 8.3 g/dL   Albumin 3.5 3.5 - 5.2 g/dL   AST 17 0 - 37 U/L   ALT 11 0 - 53 U/L   Alkaline Phosphatase 75 39 - 117 U/L   Total Bilirubin 0.6 0.3 - 1.2 mg/dL   GFR calc non Af Amer 86 (L) >90 mL/min   GFR calc Af Amer >90 >90 mL/min    Comment: (NOTE) The eGFR has been calculated using the CKD EPI equation. This calculation has not been validated in all clinical situations. eGFR's persistently <90 mL/min signify possible Chronic Kidney Disease.    Anion gap 15 5 - 15  I-Stat CG4 Lactic Acid, ED     Status: None   Collection Time: 05/26/14  2:07 PM  Result Value Ref Range   Lactic Acid, Venous 1.13 0.5 - 2.2 mmol/L  Urinalysis, Routine w reflex microscopic     Status: Abnormal   Collection Time: 05/26/14  5:34 PM  Result Value Ref Range   Color, Urine YELLOW YELLOW   APPearance CLEAR CLEAR   Specific Gravity, Urine 1.013 1.005 - 1.030   pH 7.0 5.0 - 8.0   Glucose, UA NEGATIVE NEGATIVE mg/dL   Hgb urine dipstick TRACE (A) NEGATIVE   Bilirubin Urine NEGATIVE NEGATIVE   Ketones, ur 15 (A) NEGATIVE mg/dL   Protein, ur NEGATIVE NEGATIVE mg/dL   Urobilinogen, UA 1.0 0.0 - 1.0 mg/dL   Nitrite NEGATIVE NEGATIVE   Leukocytes, UA NEGATIVE NEGATIVE  Magnesium     Status: None   Collection Time: 05/26/14  6:44 PM  Result Value Ref Range   Magnesium 2.0 1.5 - 2.5 mg/dL  Protime-INR     Status: Abnormal   Collection Time: 05/26/14  6:44 PM  Result Value Ref Range   Prothrombin Time 20.6 (H) 11.6 - 15.2 seconds   INR 1.75 (H) 0.00 - 1.49  APTT     Status: Abnormal   Collection Time: 05/26/14  6:44 PM  Result Value Ref Range   aPTT 22 (L) 24 - 37 seconds      Component Value Date/Time   SDES BLOOD LEFT HAND 05/24/2014 2338    SPECREQUEST BOTTLES DRAWN AEROBIC AND ANAEROBIC 5CC 05/24/2014 2338   CULT  05/24/2014 2338           BLOOD CULTURE RECEIVED NO GROWTH TO DATE CULTURE WILL BE HELD FOR 5 DAYS BEFORE ISSUING A FINAL NEGATIVE REPORT Performed at [facility] PENDING 05/24/2014 2338   Dg Chest 2 View  05/26/2014   CLINICAL DATA:  Positive blood cultures.  Prostate cancer  EXAM: CHEST  2 VIEW  COMPARISON:  05/24/2014  FINDINGS: Cardiac enlargement without heart failure. Mild atelectasis in the lung bases. This is unchanged. Negative for effusion.  Compression fracture T11 is unchanged from prior CT scans and may be a subacute fracture.  IMPRESSION: Bibasilar atelectasis unchanged.  No new findings.   Electronically Signed   By: Franchot Gallo M.D.   On: 05/26/2014 15:19   Ct Angio Chest Pe W/cm &/or Wo Cm  05/25/2014   CLINICAL DATA:  73 year old male with shortness of breath and hypoxia. Initial encounter.  EXAM: CT ANGIOGRAPHY CHEST WITH CONTRAST  TECHNIQUE: Multidetector CT imaging of the chest was performed using the standard protocol during bolus administration of intravenous contrast. Multiplanar CT image reconstructions and MIPs were obtained to evaluate the vascular anatomy.  CONTRAST:  28m OMNIPAQUE IOHEXOL 350 MG/ML SOLN  COMPARISON:  04/12/2014 CT.  05/24/2014 and prior chest radiographs  FINDINGS: Pulmonary emboli bilaterally again identify but decreased clot burden stents 04/12/2014. Bilateral emboli are again visualized with the in the lingula, bilateral lower lobes and right middle lobe. No definite acute or new pulmonary emboli are identified.  Cardiomegaly is again noted.  There is no evidence of pleural or pericardial effusion.  No enlarged lymph nodes are identified. Mild to moderate bilateral lower lobe atelectasis now identified.  There is no evidence of airspace disease, nodule, masses or endobronchial/endotracheal lesion.  A large hiatal hernia is again noted.  A T11 vertebral  body fracture is unchanged.  Review of the MIP images confirms the above findings.  IMPRESSION: Bilateral pulmonary emboli again noted with decreased clot burden since 04/12/2014. No new or acute pulmonary emboli identified.  New mild to moderate bilateral lower lobe subsegmental atelectasis.  Cardiomegaly and large hiatal hernia.  Unchanged T11 vertebral body fracture   Electronically Signed   By: JHassan RowanM.D.   On: 05/25/2014 03:20   Dg Chest Portable 1 View  05/25/2014   CLINICAL DATA:  Initial evaluation for acute shortness of breath.  EXAM: PORTABLE CHEST - 1 VIEW  COMPARISON:  Prior radiograph from 04/06/2014.  FINDINGS: Cardiomegaly is stable from prior study. Mediastinal silhouette within normal limits. Convexity along the right heart border likely related to patient's hiatal hernia seen on prior CT.  Lungs are hypoinflated with mild bibasilar atelectasis. No focal infiltrate, pulmonary edema, or pleural effusion. No pneumothorax.  No acute osseus abnormality.  IMPRESSION: 1. Shallow lung inflation with mild bibasilar atelectasis. No other acute cardiopulmonary abnormality. 2. Stable cardiomegaly.   Electronically Signed   By: BJeannine BogaM.D.   On: 05/25/2014 00:31   Recent Results (from the past 240 hour(s))  Blood culture (routine x 2)     Status: None (Preliminary result)   Collection Time: 05/24/14 11:32 PM  Result Value Ref Range Status   Specimen Description BLOOD LEFT ARM  Final   Special Requests BOTTLES DRAWN AEROBIC ONLY 4CC  Final   Culture  Setup Time   Final    05/25/2014 05:22 [facility]formed at [facility]   Culture   Final    [facility] Note: Gram Stain Report Called to,Read Back By and Verified With: PAM MILLER 05/26/14 0938 BY PEAKY Performed at [facility]   Report Status PENDING  Incomplete  Blood culture (routine x 2)     Status: None (Preliminary result)   Collection Time: 05/24/14 11:38 PM  Result Value Ref Range  Status   Specimen Description BLOOD LEFT HAND  Final   Special Requests BOTTLES DRAWN AEROBIC AND ANAEROBIC 5CC  Final   Culture  Setup Time   Final    05/25/2014 05:25 Performed at [facility]   Culture   Final           BLOOD CULTURE RECEIVED NO GROWTH TO DATE CULTURE WILL BE HELD FOR 5 DAYS BEFORE ISSUING A FINAL NEGATIVE REPORT Performed at [facility]   Report Status PENDING  Incomplete      05/26/2014, 7:43 PM     LOS: 0 days

## 2014-05-26 NOTE — ED Notes (Signed)
Patient returned from X-ray 

## 2014-05-26 NOTE — Consult Note (Addendum)
PHARMACY CONSULT NOTE - Initial Consult  Pharmacy Consult for : Vancomycin :  Coumadin Indication: Gm + Bacteremia :  History of Bilateral PE's with DVT  Allergies No Known Allergies  Patient Measurements: Dosing weight 111 kg  Vital Signs: BP 188/97 mmHg  Pulse 75  Temp(Src) 98.2 F (36.8 C) (Oral)  Resp 23  SpO2 98%  Labs:  Recent Labs  05/24/14 2330 05/24/14 2352 05/26/14 1355  HGB 13.3 13.6 12.7*  HCT 38.7* 40.0 38.6*  PLT 137*  --  138*  LABPROT 29.1*  --   --   INR 2.72*  --   --   CREATININE 0.75 0.80 0.80   Estimated Creatinine Clearance: 105.9 mL/min (by C-G formula based on Cr of 0.8).   Results for orders placed or performed during the hospital encounter of 05/24/14  Blood culture (routine x 2)     Status: None (Preliminary result)   Collection Time: 05/24/14 11:32 PM  Result Value Ref Range Status   Specimen Description BLOOD LEFT ARM  Final   Special Requests BOTTLES DRAWN AEROBIC ONLY 4CC  Final   Culture  Setup Time   Final    05/25/2014 05:22 Performed at [facility]    Culture   Final    [facility] IN CLUSTERS Note: Gram Stain Report Called to,Read Back By and Verified With: PAM MILLER 05/26/14 0938 BY PEAKY Performed at [facility]    Report Status PENDING  Incomplete  Blood culture (routine x 2)     Status: None (Preliminary result)   Collection Time: 05/24/14 11:38 PM  Result Value Ref Range Status   Specimen Description BLOOD LEFT HAND  Final   Special Requests BOTTLES DRAWN AEROBIC AND ANAEROBIC 5CC  Final   Culture  Setup Time   Final    05/25/2014 05:25 Performed at [facility]    Culture   Final           BLOOD CULTURE RECEIVED NO GROWTH TO DATE CULTURE WILL BE HELD FOR 5 DAYS BEFORE ISSUING A FINAL NEGATIVE REPORT Performed at [facility]    Report Status PENDING  Incomplete     Medical History: Past Medical History  Diagnosis Date  . Gout   . GERD (gastroesophageal reflux  disease)   . Barrett's esophagus 2009  . FH: colonic polyps   . Hypertension   . Pulmonary embolus 2005; 05/2014    Bilateral, coumadin x 1 years, inpatient for 5 days; hospitalized  . Subdural hematoma   . Infected sebaceous cyst back 7x5x4cm s/p I&D [id number] 01/11/2013  . History of radiation therapy 06/22/2012-08/18/2012    78 Gy to prostate  . AAA (abdominal aortic aneurysm)   . CAD (coronary artery disease)     Noted to have coronary calcification on CT  . Shortness of breath   . Pulmonary HTN 04/12/2014  . DVT (deep venous thrombosis) 05/2014    LLE  . Chronic lower back pain   . Prostate cancer 12/2011    Found on routine screening PSA, planning for implants by Dr. Sondra Come 10/16   . Colon cancer     "got it all w/colonoscopy"  . Arthritis     "back" (05/26/2014)   Past Surgical History  Procedure Laterality Date  . Craniotomy  04/03/2012    Procedure: CRANIOTOMY HEMATOMA EVACUATION SUBDURAL;  Surgeon: Erline Levine, MD;  Location: [facility];  Service: Neurosurgery;  Laterality: Left;  Left Craniotomy for Evacuation of Subdural Hematoma  .  Colonoscopy    . Polypectomy    . Irrigation and debridement sebaceous cyst  01-11-13    "off my back"  . Cardiac catheterization  04/12/2014  . Tonsillectomy  1940's  . Fracture surgery    . Orif hip fracture Left 09/2010    Archie Endo 10/03/2010  . Rigid esophagoscopy  04/2008    food impaction/notes 06/13/2008  . Brain surgery      /notes 04/03/2012    Current Medication[s] Include: Prior to Admission: Prescriptions prior to admission  Medication Sig Dispense Refill Last Dose  . allopurinol (ZYLOPRIM) 300 MG tablet Take 300 mg by mouth daily.   05/26/2014 at Unknown time  . aspirin EC 81 MG tablet Take 1 tablet (81 mg total) by mouth daily. 90 tablet 3 05/26/2014 at Unknown time  . omeprazole (PRILOSEC) 20 MG capsule Take 20 mg by mouth daily.   05/26/2014 at Unknown time  . warfarin (COUMADIN) 5 MG tablet Take 1 tablet (5 mg total)  by mouth as directed. (Patient taking differently: Take 10 mg by mouth as directed. ) 180 tablet 0 05/26/2014 at Unknown time   Scheduled:  Scheduled:  . [START ON 05/27/2014] allopurinol  300 mg Oral Daily  . [START ON 05/27/2014] aspirin EC  81 mg Oral Daily  . [START ON 05/27/2014] pantoprazole  40 mg Oral Daily  . sodium chloride  3 mL Intravenous Q12H   Infusion[s]: Infusions:  . sodium chloride 100 mL/hr (05/26/14 1730)   Antibiotic[s]: Anti-infectives    None     Assessment:  73 y.o. male w/PMH bilateral PE's X2 ([2005, then Oct 2015 w/DVT, CAD, HTN, Hx prostate CA, history of AAA, who returns to ED as instructed after blood cultures grew gram-positive bacteremia.    Pharmacy consulted to dose Vancomycin.  Pharmacy to manage Coumadin while an inpatient.  Patient reports he takes Coumadin 10 mg daily and that he had his Coumadin dose this AM.  Baseline coags pending.  Goal of Therapy:  INR goal is 2-3  Vancomycin trough level 15-20 mcg/ml  Plan:  1. Follow up baseline coags for any need for additional Coumadin today. 2. Vancomycin 2 gm IV x one, then 3. Vancomycin 1 gm IV q 12 hours. 4. Daily INR's, Platelet count, CBC.  Monitor for bleeding complications.  5. Monitor renal function, WBC, fever curve, cultures/sensitivities, and clinical progression.  Vancomycin levels as indicated.  Tameisha Covell, Craig Guess,  Pharm.D  05/26/2014,  6:19 PM  ADDENDUM:  Lab Results  Component Value Date   INR 1.75* 05/26/2014     INR reported as SUB-therapeutic.  Patient already received Coumadin 10 mg dose this AM.  PLAN:  1. Will give an additional dose of Coumadin 5 mg po tonight. 2. Will continue daily labs as previously ordered.  Carmisha Larusso, Craig Guess,  Pharm.D. ,  05/26/2014, 7:51 PM

## 2014-05-27 DIAGNOSIS — M8448XA Pathological fracture, other site, initial encounter for fracture: Secondary | ICD-10-CM

## 2014-05-27 LAB — BASIC METABOLIC PANEL
Anion gap: 13 (ref 5–15)
BUN: 9 mg/dL (ref 6–23)
CHLORIDE: 103 meq/L (ref 96–112)
CO2: 24 mEq/L (ref 19–32)
CREATININE: 0.77 mg/dL (ref 0.50–1.35)
Calcium: 8.1 mg/dL — ABNORMAL LOW (ref 8.4–10.5)
GFR calc Af Amer: >90 mL/min (ref >90)
GFR calc non Af Amer: 87 mL/min — ABNORMAL LOW (ref >90)
GLUCOSE: 106 mg/dL — AB (ref 70–99)
POTASSIUM: 4.1 meq/L (ref 3.7–5.3)
Sodium: 140 mEq/L (ref 137–147)

## 2014-05-27 LAB — CBC
HEMATOCRIT: 39.3 % (ref 39.0–52.0)
HEMOGLOBIN: 13.2 g/dL (ref 13.0–17.0)
MCH: 31.7 pg (ref 26.0–34.0)
MCHC: 33.6 g/dL (ref 30.0–36.0)
MCV: 94.2 fL (ref 78.0–100.0)
Platelets: 152 10*3/uL (ref 150–400)
RBC: 4.17 MIL/uL — AB (ref 4.22–5.81)
RDW: 14.2 % (ref 11.5–15.5)
WBC: 3.9 10*3/uL — ABNORMAL LOW (ref 4.0–10.5)

## 2014-05-27 LAB — URINE CULTURE
Colony Count: NO GROWTH
Culture: NO GROWTH

## 2014-05-27 LAB — PROTIME-INR
INR: 1.83 — ABNORMAL HIGH (ref 0.00–1.49)
Prothrombin Time: 21.3 seconds — ABNORMAL HIGH (ref 11.6–15.2)

## 2014-05-27 LAB — CULTURE, BLOOD (ROUTINE X 2)

## 2014-05-27 MED ORDER — WARFARIN SODIUM 10 MG PO TABS: 10.0000 mg | ORAL_TABLET | Freq: Every day | ORAL | Status: DC

## 2014-05-27 MED ORDER — WARFARIN SODIUM 10 MG PO TABS
10.0000 mg | ORAL_TABLET | Freq: Once | ORAL | Status: AC
  Administered 2014-05-27: 10 mg via ORAL
  Filled 2014-05-27: qty 1

## 2014-05-27 NOTE — Plan of Care (Signed)
Problem: Phase I Progression Outcomes Goal: OOB as tolerated unless otherwise ordered Outcome: Completed/Met Date Met:  05/27/14 Goal: Voiding-avoid urinary catheter unless indicated Outcome: Completed/Met Date Met:  05/27/14 Goal: Hemodynamically stable Outcome: Completed/Met Date Met:  05/27/14

## 2014-05-27 NOTE — Progress Notes (Signed)
Patient Demographics  [name], is a 73 y.o. male, DOB - 11/27/1940, XKG:[id number]  Admit date - 05/26/2014   Admitting Physician Eugenie Filler, MD  Outpatient Primary MD for the patient is Horatio Pel, MD  LOS - 1   Chief Complaint  Patient presents with  . Abnormal Lab        Subjective:   [name] today has, No headache, No chest pain, No abdominal pain - No Nausea, No new weakness tingling or numbness, No Cough - SOB.    Assessment & Plan    1. 1/2 set coag-negative staph bacteremia likely contamination. This was drawn in the ER a few days ago, chest x-ray stable, CT angiogram nonacute, patient is symptom-free and nontoxic, monitor new set of blood cultures drawn this admission. Hold off antibiotics. TTE ordered on admission pending. Appreciate ID input.    2. History of DVT PE. No acute issues, pharmacy monitoring Coumadin, mildly subtherapeutic monitor.    3.GERD - PPI    4.HTN - on Norvasc and as needed hydralazine.    5. History of CAD. No acute issues continue aspirin.    6. History of gout. Continue allopurinol.     Code Status: Full  Family Communication: None Present  Disposition Plan: Home   Procedures TTE   Consults  ID   Medications  Scheduled Meds: . allopurinol  300 mg Oral Daily  . amLODipine  5 mg Oral Daily  . aspirin EC  81 mg Oral Daily  . pantoprazole  40 mg Oral Daily  . sodium chloride  3 mL Intravenous Q12H  . vancomycin  1,000 mg Intravenous Q12H  . warfarin  5 mg Oral NOW  . Warfarin - Pharmacist Dosing Inpatient   Does not apply q1800   Continuous Infusions: . sodium chloride 100 mL/hr (05/26/14 1730)   PRN Meds:.acetaminophen **OR** acetaminophen, alum & mag hydroxide-simeth, hydrALAZINE,  ondansetron **OR** ondansetron (ZOFRAN) IV, oxyCODONE, senna-docusate, sorbitol, zolpidem  DVT Prophylaxis  Coumadin  Lab Results  Component Value Date   INR 1.83* 05/27/2014   INR 1.75* 05/26/2014   INR 2.72* 05/24/2014     Lab Results  Component Value Date   PLT 152 05/27/2014    Antibiotics    Anti-infectives    Start     Dose/Rate Route Frequency Ordered Stop   05/27/14 0700  vancomycin (VANCOCIN) IVPB 1000 mg/200 mL premix     1,000 mg200 mL/hr over 60 Minutes Intravenous Every 12 hours 05/26/14 1836     05/26/14 1900  vancomycin (VANCOCIN) 2,000 mg in sodium chloride 0.9 % 500 mL IVPB     2,000 mg250 mL/hr over 120 Minutes Intravenous NOW 05/26/14 1833 05/26/14 2223          Objective:   Filed Vitals:   05/26/14 1839 05/26/14 2055 05/27/14 0527 05/27/14 1020  BP:  146/76 155/84 147/83  Pulse:  83 74 82  Temp:  98.4 F (36.9 C) 98.6 F (37 C) 97.5 F (36.4 C)  TempSrc:  Oral Oral Oral  Resp:  22 18 22   Height: 6\' 1"  (1.854 m)     Weight: 112.401 kg (247 lb 12.8 oz) 112.401 kg (247 lb 12.8 oz)    SpO2:  94% 95% 95%  Wt Readings from Last 3 Encounters:  05/26/14 112.401 kg (247 lb 12.8 oz)  05/24/14 111.131 kg (245 lb)  05/12/14 112.22 kg (247 lb 6.4 oz)     Intake/Output Summary (Last 24 hours) at 05/27/14 1036 Last data filed at 05/27/14 1021  Gross per 24 hour  Intake   1960 ml  Output      0 ml  Net   1960 ml     Physical Exam  Awake Alert, Oriented X 3, No new F.N deficits, Normal affect [place].AT,PERRAL Supple Neck,No JVD, No cervical lymphadenopathy appriciated.  Symmetrical Chest wall movement, Good air movement bilaterally, CTAB RRR,No Gallops,Rubs or new Murmurs, No Parasternal Heave +ve B.Sounds, Abd Soft, No tenderness, No organomegaly appriciated, No rebound - guarding or rigidity. No Cyanosis, Clubbing or edema, No new Rash or bruise      Data Review   Micro Results Recent Results (from the past 240 hour(s))  Blood culture  (routine x 2)     Status: None   Collection Time: 05/24/14 11:32 PM  Result Value Ref Range Status   Specimen Description BLOOD LEFT ARM  Final   Special Requests BOTTLES DRAWN AEROBIC ONLY 4CC  Final   Culture  Setup Time   Final    05/25/2014 05:22 Performed at [facility]    Culture   Final    STAPHYLOCOCCUS SPECIES (COAGULASE NEGATIVE) Note: THE SIGNIFICANCE OF ISOLATING THIS ORGANISM FROM A SINGLE SET OF BLOOD CULTURES WHEN MULTIPLE SETS ARE DRAWN IS UNCERTAIN. PLEASE NOTIFY THE MICROBIOLOGY DEPARTMENT WITHIN ONE WEEK IF SPECIATION AND SENSITIVITIES ARE REQUIRED. Note: Gram Stain Report Called to,Read Back By and Verified With: PAM MILLER 05/26/14 0938 BY PEAKY Performed at [facility]    Report Status 05/27/2014 FINAL  Final  Blood culture (routine x 2)     Status: None (Preliminary result)   Collection Time: 05/24/14 11:38 PM  Result Value Ref Range Status   Specimen Description BLOOD LEFT HAND  Final   Special Requests BOTTLES DRAWN AEROBIC AND ANAEROBIC 5CC  Final   Culture  Setup Time   Final    05/25/2014 05:25 Performed at [facility]    Culture   Final           BLOOD CULTURE RECEIVED NO GROWTH TO DATE CULTURE WILL BE HELD FOR 5 DAYS BEFORE ISSUING A FINAL NEGATIVE REPORT Performed at [facility]    Report Status PENDING  Incomplete    Radiology Reports Dg Chest 2 View  05/26/2014   CLINICAL DATA:  Positive blood cultures.  Prostate cancer  EXAM: CHEST  2 VIEW  COMPARISON:  05/24/2014  FINDINGS: Cardiac enlargement without heart failure. Mild atelectasis in the lung bases. This is unchanged. Negative for effusion.  Compression fracture T11 is unchanged from prior CT scans and may be a subacute fracture.  IMPRESSION: Bibasilar atelectasis unchanged.  No new findings.   Electronically Signed   By: Franchot Gallo M.D.   On: 05/26/2014 15:19   Ct Angio Chest Pe W/cm &/or Wo Cm  05/25/2014   CLINICAL DATA:  73 year old male with  shortness of breath and hypoxia. Initial encounter.  EXAM: CT ANGIOGRAPHY CHEST WITH CONTRAST  TECHNIQUE: Multidetector CT imaging of the chest was performed using the standard protocol during bolus administration of intravenous contrast. Multiplanar CT image reconstructions and MIPs were obtained to evaluate the vascular anatomy.  CONTRAST:  24mL OMNIPAQUE IOHEXOL 350 MG/ML SOLN  COMPARISON:  04/12/2014 CT.  05/24/2014 and prior chest  radiographs  FINDINGS: Pulmonary emboli bilaterally again identify but decreased clot burden stents 04/12/2014. Bilateral emboli are again visualized with the in the lingula, bilateral lower lobes and right middle lobe. No definite acute or new pulmonary emboli are identified.  Cardiomegaly is again noted.  There is no evidence of pleural or pericardial effusion.  No enlarged lymph nodes are identified. Mild to moderate bilateral lower lobe atelectasis now identified.  There is no evidence of airspace disease, nodule, masses or endobronchial/endotracheal lesion.  A large hiatal hernia is again noted.  A T11 vertebral body fracture is unchanged.  Review of the MIP images confirms the above findings.  IMPRESSION: Bilateral pulmonary emboli again noted with decreased clot burden since 04/12/2014. No new or acute pulmonary emboli identified.  New mild to moderate bilateral lower lobe subsegmental atelectasis.  Cardiomegaly and large hiatal hernia.  Unchanged T11 vertebral body fracture   Electronically Signed   By: Hassan Rowan M.D.   On: 05/25/2014 03:20   Dg Chest Portable 1 View  05/25/2014   CLINICAL DATA:  Initial evaluation for acute shortness of breath.  EXAM: PORTABLE CHEST - 1 VIEW  COMPARISON:  Prior radiograph from 04/06/2014.  FINDINGS: Cardiomegaly is stable from prior study. Mediastinal silhouette within normal limits. Convexity along the right heart border likely related to patient's hiatal hernia seen on prior CT.  Lungs are hypoinflated with mild bibasilar atelectasis. No  focal infiltrate, pulmonary edema, or pleural effusion. No pneumothorax.  No acute osseus abnormality.  IMPRESSION: 1. Shallow lung inflation with mild bibasilar atelectasis. No other acute cardiopulmonary abnormality. 2. Stable cardiomegaly.   Electronically Signed   By: Jeannine Boga M.D.   On: 05/25/2014 00:31     CBC  Recent Labs Lab 05/24/14 2330 05/24/14 2352 05/26/14 1355 05/27/14 0510  WBC 4.0  --  3.7* 3.9*  HGB 13.3 13.6 12.7* 13.2  HCT 38.7* 40.0 38.6* 39.3  PLT 137*  --  138* 152  MCV 95.1  --  94.4 94.2  MCH 32.7  --  31.1 31.7  MCHC 34.4  --  32.9 33.6  RDW 14.1  --  14.3 14.2  LYMPHSABS 0.2*  --  0.5*  --   MONOABS 0.1  --  0.5  --   EOSABS 0.0  --  0.1  --   BASOSABS 0.0  --  0.0  --     Chemistries   Recent Labs Lab 05/24/14 2330 05/24/14 2352 05/26/14 1355 05/26/14 1844 05/27/14 0510  NA 141 139 138  --  140  K 4.0 3.8 4.4  --  4.1  CL 102 105 101  --  103  CO2 21  --  22  --  24  GLUCOSE 132* 134* 95  --  106*  BUN 14 13 14   --  9  CREATININE 0.75 0.80 0.80  --  0.77  CALCIUM 8.6  --  8.3*  --  8.1*  MG  --   --   --  2.0  --   AST 15  --  17  --   --   ALT 9  --  11  --   --   ALKPHOS 80  --  75  --   --   BILITOT 0.5  --  0.6  --   --    ------------------------------------------------------------------------------------------------------------------ estimated creatinine clearance is 106.4 mL/min (by C-G formula based on Cr of 0.77). ------------------------------------------------------------------------------------------------------------------ No results for input(s): HGBA1C in the last 72 hours. ------------------------------------------------------------------------------------------------------------------ No results  for input(s): CHOL, HDL, LDLCALC, TRIG, CHOLHDL, LDLDIRECT in the last 72 hours. ------------------------------------------------------------------------------------------------------------------ No results for  input(s): TSH, T4TOTAL, T3FREE, THYROIDAB in the last 72 hours.  Invalid input(s): FREET3 ------------------------------------------------------------------------------------------------------------------ No results for input(s): VITAMINB12, FOLATE, FERRITIN, TIBC, IRON, RETICCTPCT in the last 72 hours.  Coagulation profile  Recent Labs Lab 05/23/14 1149 05/24/14 2330 05/26/14 1844 05/27/14 0510  INR 2.6 2.72* 1.75* 1.83*    No results for input(s): DDIMER in the last 72 hours.  Cardiac Enzymes No results for input(s): CKMB, TROPONINI, MYOGLOBIN in the last 168 hours.  Invalid input(s): CK ------------------------------------------------------------------------------------------------------------------ Invalid input(s): POCBNP     Time Spent in minutes  35   Huxley Shurley K M.D on 05/27/2014 at 10:36 AM  Between 7am to 7pm - Pager - [phone]  After 7pm go to [email or web address] - password [email or web address]  And look for the night coverage person covering for me after hours  [facility] Office  [phone]

## 2014-05-27 NOTE — Progress Notes (Signed)
ANTICOAGULATION CONSULT NOTE - Follow Up Consult  Pharmacy Consult for Coumadin Indication: hx bilateral PE and DVT   No Known Allergies  Patient Measurements: Height: 6\' 1"  (185.4 cm) Weight: 247 lb 12.8 oz (112.401 kg) IBW/kg (Calculated) : 79.9  Vital Signs: Temp: 97.5 F (36.4 C) (12/04 1020) Temp Source: Oral (12/04 1020) BP: 147/83 mmHg (12/04 1020) Pulse Rate: 82 (12/04 1020)  Labs:  Recent Labs  05/24/14 2330 05/24/14 2352 05/26/14 1355 05/26/14 1844 05/27/14 0510  HGB 13.3 13.6 12.7*  --  13.2  HCT 38.7* 40.0 38.6*  --  39.3  PLT 137*  --  138*  --  152  APTT  --   --   --  22*  --   LABPROT 29.1*  --   --  20.6* 21.3*  INR 2.72*  --   --  1.75* 1.83*  CREATININE 0.75 0.80 0.80  --  0.77    Estimated Creatinine Clearance: 106.4 mL/min (by C-G formula based on Cr of 0.77).  Assessment:   Hx 2nd episode of bilateral PE + DVT in October 2015.   INR subtherapeutic (1.75) on admission last night on home regimen of Coumadin 10 mg daily (2 x 5 mg tablets).  An extra dose of Coumadin 5 mg was ordered by Rx, but he refused to take it.   INR 1.83 today. Per outpatient notes, INR goal is 2-2.5. Hx subdural hematoma in 2013, which required evacuation.  Last outpt INR 2.6 on 11/30.  Reluctant to increase dose due to supratherapeutic INR (6.5) earlier this month after an increase to 15 mg alternating with 10 mg every other day for 1 week.  Goal of Therapy:  INR 2-2.5 (per outpatient records) Monitor platelets by anticoagulation protocol: Yes   Plan:   Coumadin 10 mg now, then daily at 10am.  He prefers to take his daily dose in the morning, as at home.  Continue daily PT/INR, but I do not expect to increase his dose while he's in the hospital. He has an outpatient appointment already scheduled for 06/13/14.  Arty Baumgartner, [place] Pager: [phone] 05/27/2014,2:28 PM

## 2014-05-27 NOTE — Progress Notes (Signed)
Pt. Refuses to take 15 mg but will take home dose of 10 mg. He is concerned that his INR will get too high because of a situation in the past. MD aware. I have called the pharmacist to speak with him.  Penni Bombard, RN 05/27/2014 1:25 PM

## 2014-05-27 NOTE — Discharge Summary (Signed)
[name], is a 73 y.o. male  DOB 05/17/1941  MRN [id number].  Admission date:  05/26/2014  Admitting Physician  Eugenie Filler, MD  Discharge Date:  05/27/2014   Primary MD  Horatio Pel, MD  Recommendations for primary care physician for things to follow:   Follow final echogram results, follow pending blood culture results from this admission. Follow INR   Admission Diagnosis  Positive blood cultures [R78.81]   Discharge Diagnosis  Positive blood cultures [R78.81]    Principal Problem:   Bacteremia due to Gram-positive bacteria Active Problems:   Gout   GERD   Hypertension   CAD (coronary artery disease)   Bilateral pulmonary embolism   DVT, lower extremity, distal, acute   Positive blood cultures   Leukopenia   Neutropenia      Past Medical History  Diagnosis Date  . Gout   . GERD (gastroesophageal reflux disease)   . Barrett's esophagus 2009  . FH: colonic polyps   . Hypertension   . Pulmonary embolus 2005; 05/2014    Bilateral, coumadin x 1 years, inpatient for 5 days; hospitalized  . Subdural hematoma   . Infected sebaceous cyst back 7x5x4cm s/p I&D [id number] 01/11/2013  . History of radiation therapy 06/22/2012-08/18/2012    78 Gy to prostate  . AAA (abdominal aortic aneurysm)   . CAD (coronary artery disease)     Noted to have coronary calcification on CT  . Shortness of breath   . Pulmonary HTN 04/12/2014  . DVT (deep venous thrombosis) 05/2014    LLE  . Chronic lower back pain   . Prostate cancer 12/2011    Found on routine screening PSA, planning for implants by Dr. Sondra Come 10/16   . Colon cancer     "got it all w/colonoscopy"  . Arthritis     "back" (05/26/2014)    Past Surgical History  Procedure Laterality Date  . Craniotomy  04/03/2012    Procedure: CRANIOTOMY HEMATOMA  EVACUATION SUBDURAL;  Surgeon: Erline Levine, MD;  Location: [facility];  Service: Neurosurgery;  Laterality: Left;  Left Craniotomy for Evacuation of Subdural Hematoma  . Colonoscopy    . Polypectomy    . Irrigation and debridement sebaceous cyst  01-11-13    "off my back"  . Cardiac catheterization  04/12/2014  . Tonsillectomy  1940's  . Fracture surgery    . Orif hip fracture Left 09/2010    Archie Endo 10/03/2010  . Rigid esophagoscopy  04/2008    food impaction/notes 06/13/2008  . Brain surgery      /notes 04/03/2012       History of present illness and  Hospital Course:     Kindly see H&P for history of present illness and admission details, please review complete Labs, Consult reports and Test reports for all details in brief  HPI  from the history and physical done on the day of admission  [name] is a 73 y.o. male with past medical history of bilateral pulmonary embolism X2 ( first in  2005, second inOctober 2015 with a DVT, on chronic Coumadin), coronary artery disease status post catheterization, hypertension, history of prostate cancer, history of AAA, arthritis, gout, GERD, that return to ED as instructed by EDP after blood cultures with gram-positive bacteremia. Patient was in the hospital 05/24/2014 for evaluation of fever and chills, but was discharged after lab were all WNL and no evidence of infection identified. Blood cultures 06/05/2014 have come back with gram positive bacteremia. Patient states he was well at home denies any fever, chills, chest pain, shortness of breath, cough, abdominal pain, nausea, vomiting, diarrhea, dysuria, burning with urination, or lower extremity edema. Wife states that she has noticed increased in urgency. Patient states that left lower extremity with chronic swelling after previous fracture.  Hospital Course     1. 1/2 set coag-negative staph bacteremia likely contamination. This was drawn in the ER a few days ago, chest x-ray stable,  CT angiogram nonacute, patient is symptom-free and nontoxic, monitor new set of blood cultures drawn this admission. Hold off antibiotics. TTE ordered on admission pending. Appreciate ID input. Cleared by infectious disease to discharge. We'll request PCP to please monitor final echogram results and blood culture results ordered this admission. Blood cultures thus far have been negative.    2. History of DVT PE. No acute issues, pharmacy monitoring Coumadin, mildly subtherapeutic INR, he refused higher dose Coumadin here. Request PCP to monitor Coumadin and INR dose closely.    3.GERD - PPI    4.HTN - on Norvasc and as needed hydralazine.    5. History of CAD. No acute issues continue aspirin.    6. History of gout. Continue allopurinol.     Discharge Condition:  stable   Follow UP  Follow-up Information    Follow up with Horatio Pel, MD. Schedule an appointment as soon as possible for a visit in 2 days.   Specialty:  Internal Medicine   Contact information:   [address] [place] [address] [phone]         Discharge Instructions  and  Discharge Medications      Discharge Instructions    Diet - low sodium heart healthy    Complete by:  As directed      Discharge instructions    Complete by:  As directed   Follow with Primary MD Horatio Pel, MD in 1-2 days   Get CBC, CMP, INR, checked  by Primary MD next visit.    Activity: As tolerated with Full fall precautions use walker/cane & assistance as needed   Disposition Home     Diet: Heart Healthy    For Heart failure patients - Check your Weight same time everyday, if you gain over 2 pounds, or you develop in leg swelling, experience more shortness of breath or chest pain, call your Primary MD immediately. Follow Cardiac Low Salt Diet and 1.8 lit/day fluid restriction.   On your next visit with your primary care physician please Get Medicines reviewed and  adjusted.   Please request your Prim.MD to go over all Hospital Tests and Procedure/Radiological results at the follow up, please get all Hospital records sent to your Prim MD by signing hospital release before you go home.   If you experience worsening of your admission symptoms, develop shortness of breath, life threatening emergency, suicidal or homicidal thoughts you must seek medical attention immediately by calling 911 or calling your MD immediately  if symptoms less severe.  You Must read complete instructions/literature along with  all the possible adverse reactions/side effects for all the Medicines you take and that have been prescribed to you. Take any new Medicines after you have completely understood and accpet all the possible adverse reactions/side effects.   Do not drive, operating heavy machinery, perform activities at heights, swimming or participation in water activities or provide baby sitting services if your were admitted for syncope or siezures until you have seen by Primary MD or a Neurologist and advised to do so again.  Do not drive when taking Pain medications.    Do not take more than prescribed Pain, Sleep and Anxiety Medications  Special Instructions: If you have smoked or chewed Tobacco  in the last 2 yrs please stop smoking, stop any regular Alcohol  and or any Recreational drug use.  Wear Seat belts while driving.   Please note  You were cared for by a hospitalist during your hospital stay. If you have any questions about your discharge medications or the care you received while you were in the hospital after you are discharged, you can call the unit and asked to speak with the hospitalist on call if the hospitalist that took care of you is not available. Once you are discharged, your primary care physician will handle any further medical issues. Please note that NO REFILLS for any discharge medications will be authorized once you are discharged, as it is  imperative that you return to your primary care physician (or establish a relationship with a primary care physician if you do not have one) for your aftercare needs so that they can reassess your need for medications and monitor your lab values.     Increase activity slowly    Complete by:  As directed             Medication List    TAKE these medications        allopurinol 300 MG tablet  Commonly known as:  ZYLOPRIM  Take 300 mg by mouth daily.     aspirin EC 81 MG tablet  Take 1 tablet (81 mg total) by mouth daily.     omeprazole 20 MG capsule  Commonly known as:  PRILOSEC  Take 20 mg by mouth daily.     warfarin 5 MG tablet  Commonly known as:  COUMADIN  Take 1 tablet (5 mg total) by mouth as directed.          Diet and Activity recommendation: See Discharge Instructions above   Consults obtained -  ID   Major procedures and Radiology Reports - PLEASE review detailed and final reports for all details, in brief -   TTE - Pending     Dg Chest 2 View  05/26/2014   CLINICAL DATA:  Positive blood cultures.  Prostate cancer  EXAM: CHEST  2 VIEW  COMPARISON:  05/24/2014  FINDINGS: Cardiac enlargement without heart failure. Mild atelectasis in the lung bases. This is unchanged. Negative for effusion.  Compression fracture T11 is unchanged from prior CT scans and may be a subacute fracture.  IMPRESSION: Bibasilar atelectasis unchanged.  No new findings.   Electronically Signed   By: Franchot Gallo M.D.   On: 05/26/2014 15:19   Ct Angio Chest Pe W/cm &/or Wo Cm  05/25/2014   CLINICAL DATA:  73 year old male with shortness of breath and hypoxia. Initial encounter.  EXAM: CT ANGIOGRAPHY CHEST WITH CONTRAST  TECHNIQUE: Multidetector CT imaging of the chest was performed using the standard protocol during bolus administration of intravenous contrast.  Multiplanar CT image reconstructions and MIPs were obtained to evaluate the vascular anatomy.  CONTRAST:  63mL OMNIPAQUE IOHEXOL  350 MG/ML SOLN  COMPARISON:  04/12/2014 CT.  05/24/2014 and prior chest radiographs  FINDINGS: Pulmonary emboli bilaterally again identify but decreased clot burden stents 04/12/2014. Bilateral emboli are again visualized with the in the lingula, bilateral lower lobes and right middle lobe. No definite acute or new pulmonary emboli are identified.  Cardiomegaly is again noted.  There is no evidence of pleural or pericardial effusion.  No enlarged lymph nodes are identified. Mild to moderate bilateral lower lobe atelectasis now identified.  There is no evidence of airspace disease, nodule, masses or endobronchial/endotracheal lesion.  A large hiatal hernia is again noted.  A T11 vertebral body fracture is unchanged.  Review of the MIP images confirms the above findings.  IMPRESSION: Bilateral pulmonary emboli again noted with decreased clot burden since 04/12/2014. No new or acute pulmonary emboli identified.  New mild to moderate bilateral lower lobe subsegmental atelectasis.  Cardiomegaly and large hiatal hernia.  Unchanged T11 vertebral body fracture   Electronically Signed   By: Hassan Rowan M.D.   On: 05/25/2014 03:20   Dg Chest Portable 1 View  05/25/2014   CLINICAL DATA:  Initial evaluation for acute shortness of breath.  EXAM: PORTABLE CHEST - 1 VIEW  COMPARISON:  Prior radiograph from 04/06/2014.  FINDINGS: Cardiomegaly is stable from prior study. Mediastinal silhouette within normal limits. Convexity along the right heart border likely related to patient's hiatal hernia seen on prior CT.  Lungs are hypoinflated with mild bibasilar atelectasis. No focal infiltrate, pulmonary edema, or pleural effusion. No pneumothorax.  No acute osseus abnormality.  IMPRESSION: 1. Shallow lung inflation with mild bibasilar atelectasis. No other acute cardiopulmonary abnormality. 2. Stable cardiomegaly.   Electronically Signed   By: Jeannine Boga M.D.   On: 05/25/2014 00:31    Micro Results      Recent Results  (from the past 240 hour(s))  Blood culture (routine x 2)     Status: None   Collection Time: 05/24/14 11:32 PM  Result Value Ref Range Status   Specimen Description BLOOD LEFT ARM  Final   Special Requests BOTTLES DRAWN AEROBIC ONLY 4CC  Final   Culture  Setup Time   Final    05/25/2014 05:22 Performed at [facility]    Culture   Final    STAPHYLOCOCCUS SPECIES (COAGULASE NEGATIVE) Note: THE SIGNIFICANCE OF ISOLATING THIS ORGANISM FROM A SINGLE SET OF BLOOD CULTURES WHEN MULTIPLE SETS ARE DRAWN IS UNCERTAIN. PLEASE NOTIFY THE MICROBIOLOGY DEPARTMENT WITHIN ONE WEEK IF SPECIATION AND SENSITIVITIES ARE REQUIRED. Note: Gram Stain Report Called to,Read Back By and Verified With: PAM MILLER 05/26/14 0938 BY PEAKY Performed at [facility]    Report Status 05/27/2014 FINAL  Final  Blood culture (routine x 2)     Status: None (Preliminary result)   Collection Time: 05/24/14 11:38 PM  Result Value Ref Range Status   Specimen Description BLOOD LEFT HAND  Final   Special Requests BOTTLES DRAWN AEROBIC AND ANAEROBIC 5CC  Final   Culture  Setup Time   Final    05/25/2014 05:25 Performed at [facility]    Culture   Final           BLOOD CULTURE RECEIVED NO GROWTH TO DATE CULTURE WILL BE HELD FOR 5 DAYS BEFORE ISSUING A FINAL NEGATIVE REPORT Performed at [facility]    Report Status PENDING  Incomplete       Today   Subjective:   Mariano Doshi today has no headache,no chest abdominal pain,no new weakness tingling or numbness, feels much better wants to go home today.    Objective:   Blood pressure 147/83, pulse 82, temperature 97.5 F (36.4 C), temperature source Oral, resp. rate 22, height 6\' 1"  (1.854 m), weight 112.401 kg (247 lb 12.8 oz), SpO2 95 %.   Intake/Output Summary (Last 24 hours) at 05/27/14 1642 Last data filed at 05/27/14 1021  Gross per 24 hour  Intake   1960 ml  Output      0 ml  Net   1960 ml    Exam Awake Alert, Oriented  x 3, No new F.N deficits, Normal affect [place].AT,PERRAL Supple Neck,No JVD, No cervical lymphadenopathy appriciated.  Symmetrical Chest wall movement, Good air movement bilaterally, CTAB RRR,No Gallops,Rubs or new Murmurs, No Parasternal Heave +ve B.Sounds, Abd Soft, Non tender, No organomegaly appriciated, No rebound -guarding or rigidity. No Cyanosis, Clubbing or edema, No new Rash or bruise  Data Review   CBC w Diff: Lab Results  Component Value Date   WBC 3.9* 05/27/2014   HGB 13.2 05/27/2014   HCT 39.3 05/27/2014   PLT 152 05/27/2014   LYMPHOPCT 15 05/26/2014   MONOPCT 12 05/26/2014   EOSPCT 3 05/26/2014   BASOPCT 0 05/26/2014    CMP: Lab Results  Component Value Date   NA 140 05/27/2014   K 4.1 05/27/2014   CL 103 05/27/2014   CO2 24 05/27/2014   BUN 9 05/27/2014   CREATININE 0.77 05/27/2014   CREATININE 1.17 04/11/2014   PROT 6.8 05/26/2014   ALBUMIN 3.5 05/26/2014   BILITOT 0.6 05/26/2014   ALKPHOS 75 05/26/2014   AST 17 05/26/2014   ALT 11 05/26/2014  .  Lab Results  Component Value Date   INR 1.83* 05/27/2014   INR 1.75* 05/26/2014   INR 2.72* 05/24/2014     Total Time in preparing paper work, data evaluation and todays exam - 35 minutes  Thurnell Lose M.D on 05/27/2014 at 4:42 PM  [facility] Office  [phone]

## 2014-05-27 NOTE — Evaluation (Signed)
Physical Therapy Evaluation and Discharge Patient Details Name: [name] MRN: [id number] DOB: 1941/05/23 Today's Date: 05/27/2014   History of Present Illness  Pt is a 73 y.o. male with PMH of bilateral PE X2 ( first in 2005, second in October 2015 with a DVT, on chronic Coumadin), CAD s/p catheterization, HTN, history of prostate cancer, history of AAA, arthritis, gout, GERD, that return to ED as instructed by EDP after blood cultures with gram-positive bacteremia. Patient was in the hospital 05/24/2014 for evaluation of fever and chills, but was discharged after lab were all WNL and no evidence of infection identified. Blood cultures 06/05/2014 have come back with gram positive bacteremia. Patient states he was well at home. Wife states that she has noticed increased in urgency. Patient states that left lower extremity with chronic swelling after previous fracture.  Clinical Impression  Patient evaluated by Physical Therapy with no further acute PT needs identified. All education has been completed and the patient has no further questions. At the time of PT eval pt was able to perform transfers and ambulation without an AD and without any noted unsteadiness or LOB. Pt states he is at his baseline, and gait deviations are from progressive arthritis in his back. Discussed the importance of good posture when at rest and during activity. See below for any follow-up Physial Therapy or equipment needs. PT is signing off. Thank you for this referral.     Follow Up Recommendations No PT follow up    Equipment Recommendations  None recommended by PT    Recommendations for Other Services       Precautions / Restrictions Precautions Precautions: Fall Restrictions Weight Bearing Restrictions: No      Mobility  Bed Mobility               General bed mobility comments: Pt sitting in recliner chair upon PT arrival.   Transfers Overall transfer level: Modified independent Equipment  used: None             General transfer comment: Increased time to complete transfers, no physical assist required.   Ambulation/Gait Ambulation/Gait assistance: Modified independent (Device/Increase time) Ambulation Distance (Feet): 480 Feet Assistive device: None Gait Pattern/deviations: Step-through pattern;Decreased stride length;Trunk flexed Gait velocity: Decreased Gait velocity interpretation: Below normal speed for age/gender General Gait Details: Pt with slow gait speed, however no unsteadiness or LOB noted. Pt unable to improve posture due to reported arthritic changes. Pt did not report any fatigue after ambulation. Noted decreased floor clearance with feet during ambulation, however pt states this is his normal and he has not had any falls in the past year.   Stairs            Wheelchair Mobility    Modified Rankin (Stroke Patients Only)       Balance Overall balance assessment: No apparent balance deficits (not formally assessed)                                           Pertinent Vitals/Pain Pain Assessment: No/denies pain    Home Living Family/patient expects to be discharged to:: Private residence Living Arrangements: Spouse/significant other Available Help at Discharge: Family;Available 24 hours/day Type of Home: House       Home Layout: One level Home Equipment: None      Prior Function Level of Independence: Independent  Hand Dominance   Dominant Hand: Right    Extremity/Trunk Assessment   Upper Extremity Assessment: Overall WFL for tasks assessed           Lower Extremity Assessment: Overall WFL for tasks assessed (Grossly 4+/5 strength bilaterally)      Cervical / Trunk Assessment: Normal;Other exceptions  Communication   Communication: No difficulties  Cognition Arousal/Alertness: Awake/alert Behavior During Therapy: Flat affect Overall Cognitive Status: Within Functional Limits for  tasks assessed                      General Comments      Exercises        Assessment/Plan    PT Assessment Patent does not need any further PT services  PT Diagnosis Abnormality of gait   PT Problem List    PT Treatment Interventions     PT Goals (Current goals can be found in the Care Plan section) Acute Rehab PT Goals PT Goal Formulation: All assessment and education complete, DC therapy    Frequency     Barriers to discharge        Co-evaluation               End of Session Equipment Utilized During Treatment: Gait belt Activity Tolerance: Patient tolerated treatment well Patient left: in chair;with call bell/phone within reach;with nursing/sitter in room Nurse Communication: Mobility status         Time: 1000-1018 PT Time Calculation (min) (ACUTE ONLY): 18 min   Charges:   PT Evaluation $Initial PT Evaluation Tier I: 1 Procedure PT Treatments $Gait Training: 8-22 mins   PT G Codes:          Rolinda Roan 05/27/2014, 12:38 PM   Rolinda Roan, PT, DPT Acute Rehabilitation Services Pager: [phone]

## 2014-05-27 NOTE — Progress Notes (Signed)
  Echocardiogram 2D Echocardiogram has been performed.  [name] 05/27/2014, 3:04 PM

## 2014-05-27 NOTE — Plan of Care (Signed)
Problem: Phase II Progression Outcomes Goal: Obtain order to discontinue catheter if appropriate Outcome: Not Applicable Date Met:  05/27/14     

## 2014-05-27 NOTE — Progress Notes (Signed)
INFECTIOUS DISEASE PROGRESS NOTE  ID: [name] is a 73 y.o. male with  Principal Problem:   Bacteremia due to Gram-positive bacteria Active Problems:   Gout   GERD   Hypertension   CAD (coronary artery disease)   Bilateral pulmonary embolism   DVT, lower extremity, distal, acute   Positive blood cultures   Leukopenia   Neutropenia  Subjective: Without complaints  Abtx:  Anti-infectives    Start     Dose/Rate Route Frequency Ordered Stop   05/27/14 0700  vancomycin (VANCOCIN) IVPB 1000 mg/200 mL premix  Status:  Discontinued     1,000 mg200 mL/hr over 60 Minutes Intravenous Every 12 hours 05/26/14 1836 05/27/14 1037   05/26/14 1900  vancomycin (VANCOCIN) 2,000 mg in sodium chloride 0.9 % 500 mL IVPB     2,000 mg250 mL/hr over 120 Minutes Intravenous NOW 05/26/14 1833 05/26/14 2223      Medications:  Scheduled: . allopurinol  300 mg Oral Daily  . amLODipine  5 mg Oral Daily  . aspirin EC  81 mg Oral Daily  . pantoprazole  40 mg Oral Daily  . sodium chloride  3 mL Intravenous Q12H  . [START ON 05/28/2014] warfarin  10 mg Oral Daily  . Warfarin - Pharmacist Dosing Inpatient   Does not apply q1800    Objective: Vital signs in last 24 hours: Temp:  [97.5 F (36.4 C)-98.6 F (37 C)] 97.5 F (36.4 C) (12/04 1020) Pulse Rate:  [74-83] 82 (12/04 1020) Resp:  [18-23] 22 (12/04 1020) BP: (146-188)/(76-97) 147/83 mmHg (12/04 1020) SpO2:  [94 %-99 %] 95 % (12/04 1020) Weight:  [112.401 kg (247 lb 12.8 oz)] 112.401 kg (247 lb 12.8 oz) (12/03 2055)   General appearance: alert, cooperative and no distress Resp: clear to auscultation bilaterally Cardio: regular rate and rhythm GI: normal findings: bowel sounds normal and soft, non-tender Extremities: edema > 3+ LLE  Lab Results  Recent Labs  05/26/14 1355 05/27/14 0510  WBC 3.7* 3.9*  HGB 12.7* 13.2  HCT 38.6* 39.3  NA 138 140  K 4.4 4.1  CL 101 103  CO2 22 24  BUN 14 9  CREATININE 0.80 0.77   Liver  Panel  Recent Labs  05/24/14 2330 05/26/14 1355  PROT 6.6 6.8  ALBUMIN 3.3* 3.5  AST 15 17  ALT 9 11  ALKPHOS 80 75  BILITOT 0.5 0.6   Sedimentation Rate No results for input(s): ESRSEDRATE in the last 72 hours. C-Reactive Protein No results for input(s): CRP in the last 72 hours.  Microbiology: Recent Results (from the past 240 hour(s))  Blood culture (routine x 2)     Status: None   Collection Time: 05/24/14 11:32 PM  Result Value Ref Range Status   Specimen Description BLOOD LEFT ARM  Final   Special Requests BOTTLES DRAWN AEROBIC ONLY 4CC  Final   Culture  Setup Time   Final    05/25/2014 05:22 Performed at [facility]    Culture   Final    STAPHYLOCOCCUS SPECIES (COAGULASE NEGATIVE) Note: THE SIGNIFICANCE OF ISOLATING THIS ORGANISM FROM A SINGLE SET OF BLOOD CULTURES WHEN MULTIPLE SETS ARE DRAWN IS UNCERTAIN. PLEASE NOTIFY THE MICROBIOLOGY DEPARTMENT WITHIN ONE WEEK IF SPECIATION AND SENSITIVITIES ARE REQUIRED. Note: Gram Stain Report Called to,Read Back By and Verified With: PAM MILLER 05/26/14 0938 BY PEAKY Performed at [facility]    Report Status 05/27/2014 FINAL  Final  Blood culture (routine x 2)  Status: None (Preliminary result)   Collection Time: 05/24/14 11:38 PM  Result Value Ref Range Status   Specimen Description BLOOD LEFT HAND  Final   Special Requests BOTTLES DRAWN AEROBIC AND ANAEROBIC 5CC  Final   Culture  Setup Time   Final    05/25/2014 05:25 Performed at [facility]    Culture   Final           BLOOD CULTURE RECEIVED NO GROWTH TO DATE CULTURE WILL BE HELD FOR 5 DAYS BEFORE ISSUING A FINAL NEGATIVE REPORT Performed at [facility]    Report Status PENDING  Incomplete    Studies/Results: Dg Chest 2 View  05/26/2014   CLINICAL DATA:  Positive blood cultures.  Prostate cancer  EXAM: CHEST  2 VIEW  COMPARISON:  05/24/2014  FINDINGS: Cardiac enlargement without heart failure. Mild atelectasis in the lung  bases. This is unchanged. Negative for effusion.  Compression fracture T11 is unchanged from prior CT scans and may be a subacute fracture.  IMPRESSION: Bibasilar atelectasis unchanged.  No new findings.   Electronically Signed   By: Franchot Gallo M.D.   On: 05/26/2014 15:19     Assessment/Plan: Coag Neg Staph in 1 BCx T11 compression fracture, old  Suspect this is contaminant Can be sent home off anbx F/u TTE Total days of antibiotics: [facility] (pager) [phone] [email or web address] 05/27/2014, 3:36 PM  LOS: 1 day

## 2014-05-27 NOTE — Discharge Instructions (Signed)
Follow with Primary MD Horatio Pel, MD in 1-2 days   Get CBC, CMP, INR, checked  by Primary MD next visit.    Activity: As tolerated with Full fall precautions use walker/cane & assistance as needed   Disposition Home     Diet: Heart Healthy    For Heart failure patients - Check your Weight same time everyday, if you gain over 2 pounds, or you develop in leg swelling, experience more shortness of breath or chest pain, call your Primary MD immediately. Follow Cardiac Low Salt Diet and 1.8 lit/day fluid restriction.   On your next visit with your primary care physician please Get Medicines reviewed and adjusted.   Please request your Prim.MD to go over all Hospital Tests and Procedure/Radiological results at the follow up, please get all Hospital records sent to your Prim MD by signing hospital release before you go home.   If you experience worsening of your admission symptoms, develop shortness of breath, life threatening emergency, suicidal or homicidal thoughts you must seek medical attention immediately by calling 911 or calling your MD immediately  if symptoms less severe.  You Must read complete instructions/literature along with all the possible adverse reactions/side effects for all the Medicines you take and that have been prescribed to you. Take any new Medicines after you have completely understood and accpet all the possible adverse reactions/side effects.   Do not drive, operating heavy machinery, perform activities at heights, swimming or participation in water activities or provide baby sitting services if your were admitted for syncope or siezures until you have seen by Primary MD or a Neurologist and advised to do so again.  Do not drive when taking Pain medications.    Do not take more than prescribed Pain, Sleep and Anxiety Medications  Special Instructions: If you have smoked or chewed Tobacco  in the last 2 yrs please stop smoking, stop any regular  Alcohol  and or any Recreational drug use.  Wear Seat belts while driving.   Please note  You were cared for by a hospitalist during your hospital stay. If you have any questions about your discharge medications or the care you received while you were in the hospital after you are discharged, you can call the unit and asked to speak with the hospitalist on call if the hospitalist that took care of you is not available. Once you are discharged, your primary care physician will handle any further medical issues. Please note that NO REFILLS for any discharge medications will be authorized once you are discharged, as it is imperative that you return to your primary care physician (or establish a relationship with a primary care physician if you do not have one) for your aftercare needs so that they can reassess your need for medications and monitor your lab values.

## 2014-05-27 NOTE — Progress Notes (Signed)
Pt. Discharged home with wife. Pt. Hemodynamically stable. Discharge instructions given, patient verbalized understanding and signs and symptoms of worsening condition and when to seek immediate care. Follow-up appointments made.   Penni Bombard, RN 05/27/2014 6:06 PM

## 2014-05-27 NOTE — Progress Notes (Signed)
OT Cancellation Note  Patient Details Name: [name] MRN: [id number] DOB: 1940-09-27   Cancelled Treatment:    Reason Eval/Treat Not Completed: OT screened, no needs identified, will sign off (Per PT, pt is independent in ADL and mobility.)  [name] 05/27/2014, 10:40 AM  [phone]

## 2014-05-28 ENCOUNTER — Other Ambulatory Visit: Payer: Self-pay | Admitting: Physician Assistant

## 2014-05-29 NOTE — Progress Notes (Signed)
UR Completed.  [phone]  

## 2014-05-31 LAB — CULTURE, BLOOD (ROUTINE X 2): Culture: NO GROWTH

## 2014-06-02 ENCOUNTER — Encounter (HOSPITAL_COMMUNITY): Payer: Self-pay | Admitting: Cardiology

## 2014-06-02 LAB — CULTURE, BLOOD (ROUTINE X 2)
Culture: NO GROWTH
Culture: NO GROWTH

## 2014-06-13 ENCOUNTER — Telehealth: Payer: Self-pay | Admitting: Pharmacist Clinician (PhC)/ Clinical Pharmacy Specialist

## 2014-06-13 ENCOUNTER — Ambulatory Visit (INDEPENDENT_AMBULATORY_CARE_PROVIDER_SITE_OTHER): Payer: Medicare Other | Admitting: Pharmacist Clinician (PhC)/ Clinical Pharmacy Specialist

## 2014-06-13 DIAGNOSIS — I2699 Other pulmonary embolism without acute cor pulmonale: Secondary | ICD-10-CM

## 2014-06-13 DIAGNOSIS — I824Z2 Acute embolism and thrombosis of unspecified deep veins of left distal lower extremity: Secondary | ICD-10-CM

## 2014-06-13 DIAGNOSIS — I272 Pulmonary hypertension, unspecified: Secondary | ICD-10-CM

## 2014-06-13 DIAGNOSIS — I270 Primary pulmonary hypertension: Secondary | ICD-10-CM

## 2014-06-13 DIAGNOSIS — I82409 Acute embolism and thrombosis of unspecified deep veins of unspecified lower extremity: Secondary | ICD-10-CM

## 2014-06-13 LAB — CBC
HEMATOCRIT: 37.8 % — AB (ref 39.0–52.0)
Hemoglobin: 13.3 g/dL (ref 13.0–17.0)
MCH: 31.4 pg (ref 26.0–34.0)
MCHC: 35.2 g/dL (ref 30.0–36.0)
MCV: 89.4 fL (ref 78.0–100.0)
MPV: 8.9 fL — ABNORMAL LOW (ref 9.4–12.4)
Platelets: 229 10*3/uL (ref 150–400)
RBC: 4.23 MIL/uL (ref 4.22–5.81)
RDW: 14.2 % (ref 11.5–15.5)
WBC: 5.1 10*3/uL (ref 4.0–10.5)

## 2014-06-13 LAB — POCT INR: INR: 4.9

## 2014-06-13 NOTE — Telephone Encounter (Signed)
Has questions about orders that were given this morning.

## 2014-06-14 LAB — PROTIME-INR: INR: 3.7 — AB (ref 0.9–1.1)

## 2014-06-22 ENCOUNTER — Ambulatory Visit (INDEPENDENT_AMBULATORY_CARE_PROVIDER_SITE_OTHER): Payer: Medicare Other | Admitting: Pharmacist Clinician (PhC)/ Clinical Pharmacy Specialist

## 2014-06-22 ENCOUNTER — Encounter: Payer: Self-pay | Admitting: Gastroenterology

## 2014-06-22 DIAGNOSIS — I272 Pulmonary hypertension, unspecified: Secondary | ICD-10-CM

## 2014-06-22 DIAGNOSIS — I270 Primary pulmonary hypertension: Secondary | ICD-10-CM

## 2014-06-22 DIAGNOSIS — I824Z2 Acute embolism and thrombosis of unspecified deep veins of left distal lower extremity: Secondary | ICD-10-CM

## 2014-06-22 DIAGNOSIS — I2699 Other pulmonary embolism without acute cor pulmonale: Secondary | ICD-10-CM

## 2014-06-22 LAB — POCT INR: INR: 2.9

## 2014-06-27 ENCOUNTER — Ambulatory Visit (INDEPENDENT_AMBULATORY_CARE_PROVIDER_SITE_OTHER): Payer: Medicare Other | Admitting: Nurse Practitioner

## 2014-06-27 ENCOUNTER — Encounter: Payer: Self-pay | Admitting: Nurse Practitioner

## 2014-06-27 VITALS — BP 148/70 | HR 96 | Ht 73.0 in | Wt 250.2 lb

## 2014-06-27 DIAGNOSIS — K2270 Barrett's esophagus without dysplasia: Secondary | ICD-10-CM

## 2014-06-27 DIAGNOSIS — K625 Hemorrhage of anus and rectum: Secondary | ICD-10-CM

## 2014-06-27 MED ORDER — HYDROCORTISONE ACETATE 25 MG RE SUPP: 25.0000 mg | Freq: Two times a day (BID) | RECTAL | Status: DC

## 2014-06-27 NOTE — Progress Notes (Signed)
HPI :   Patient is a 74 year old male known with multiple medical problems known to Dr. Olevia Perches for history of adenomatous colon polyps and well as Barrett's esophagus. Patient  Was seen by his PCP to 2214 for new-onset rectal bleeding. He presents today with ongoing intermittent bleeding bowel movements over the last 3 weeks. He has no rectal pain. Patient does complain of some lower abdominal discomfort but this has been ongoing for a couple of years and there is a question of whether this pain is related to his known back problems. Patient is unable to lay flat, he sleeps in a recliner because of back problems. In addition to the rectal bleeding the patient's bowel movements have changed in that he is having 2-5 loose bowel movements a day with his baseline being one solid stool a day.  Hemoglobin12/22/15 was 13.4. Endoscopy was done by PCP on 1222 findings of an internal hemorrhoid at 7:00    Past Medical History  Diagnosis Date  . Gout   . GERD (gastroesophageal reflux disease)   . Barrett's esophagus 2009  . FH: colonic polyps   . Hypertension   . Pulmonary embolus 2005; 05/2014    Bilateral, coumadin x 1 years, inpatient for 5 days; hospitalized  . Subdural hematoma   . Infected sebaceous cyst back 7x5x4cm s/p I&D [id number] 01/11/2013  . History of radiation therapy 06/22/2012-08/18/2012    78 Gy to prostate  . AAA (abdominal aortic aneurysm)   . CAD (coronary artery disease)     Noted to have coronary calcification on CT  . Shortness of breath   . Pulmonary HTN 04/12/2014  . DVT (deep venous thrombosis) 05/2014    LLE  . Chronic lower back pain   . Prostate cancer 12/2011    Found on routine screening PSA, planning for implants by Dr. Sondra Come 10/16   . Colon polyps     hyperplastic, adenomatous  . Arthritis     "back" (05/26/2014)  . Diverticulosis   . Esophageal stricture   . Duodenitis     Family History  Problem Relation Age of Onset  . Heart disease      No family  history  . Macular degeneration Mother   . Osteoarthritis Mother   . Aneurysm Father     d/o 89 yo, ruptured AAA  . Colon cancer Neg Hx   . Colon polyps Neg Hx   . Diabetes Neg Hx    History  Substance Use Topics  . Smoking status: Former Smoker -- 1.00 packs/day for 20 years    Types: Cigarettes    Quit date: 12/23/1971  . Smokeless tobacco: Never Used  . Alcohol Use: 8.4 oz/week    14 Cans of beer per week     Comment: 05/26/2014 "couple beers qd"   Current Outpatient Prescriptions  Medication Sig Dispense Refill  . allopurinol (ZYLOPRIM) 300 MG tablet Take 300 mg by mouth daily.    Marland Kitchen aspirin EC 81 MG tablet Take 1 tablet (81 mg total) by mouth daily. 90 tablet 3  . nystatin cream (MYCOSTATIN) Apply 1 application topically 2 (two) times daily.   0  . omeprazole (PRILOSEC) 20 MG capsule Take 20 mg by mouth daily.    Marland Kitchen warfarin (COUMADIN) 5 MG tablet Take 1 tablet (5 mg total) by mouth as directed. (Patient taking differently: Take 10 mg by mouth as directed. ) 180 tablet 0  . hydrocortisone (ANUSOL-HC) 25 MG suppository Place 1 suppository (25 mg  total) rectally every 12 (twelve) hours. 10 suppository 0   No current facility-administered medications for this visit.   No Known Allergies   Review of Systems: Positive for back pain, hearing problems skin rash and swelling of the feet and hands. All other systems reviewed and negative except where noted in HPI.   Physical Exam: BP 148/70 mmHg  Pulse 96  Ht 6\' 1"  (1.854 m)  Wt 250 lb 4 oz (113.513 kg)  BMI 33.02 kg/m2 Constitutional: Pleasant,well-developed, white male in no acute distress. HEENT: Normocephalic and atraumatic. Conjunctivae are normal. No scleral icterus. Neck supple.  Cardiovascular: Normal rate, regular rhythm.  Pulmonary/chest: Effort normal and breath sounds normal. No wheezing, rales or rhonchi. Abdominal: Limted exam, he is unable to lay flat due to back problems. Soft, nondistended, nontender. Bowel  sounds active throughout. There are no masses palpable. No hepatomegaly. Extremities: no edema Lymphadenopathy: No cervical adenopathy noted. Neurological: Alert and oriented to person place and time. Skin: Skin is warm and dry. No rashes noted. Psychiatric: Normal mood and affect. Behavior is normal.   ASSESSMENT AND PLAN:  26. 74 year old male with intermittent painless rectal bleeding over the last 3 weeks. Internal hemorrhoids seen on an anoscopy by PCP. Will try Anusol-HC suppositories nightly for 7 days. Discouraged sitting on toilet for prolonged periods of time. Patient does have a history of large adenomatous polyps August 2014. Based on the colonoscopy report it appears that polyps could not be completely removed. Patient is on the colonoscopy recall list for 5 years but this may need to be reconsidered given the bleeding (even though it is likely hemorrhoidal). I will discuss with patient's primary GI, Dr. Olevia Perches.   2. Multiple medical problems as listed in PMH. Patient is on chronic coumadin.

## 2014-06-27 NOTE — Patient Instructions (Signed)
We have sent the following medications to your pharmacy for you to pick up at your convenience: [name] will receive a call once [name] talks to Dr. Olevia Perches about your Colonoscopy.

## 2014-06-28 ENCOUNTER — Encounter: Payer: Self-pay | Admitting: Nurse Practitioner

## 2014-06-28 ENCOUNTER — Telehealth: Payer: Self-pay | Admitting: Nurse Practitioner

## 2014-06-28 DIAGNOSIS — K625 Hemorrhage of anus and rectum: Secondary | ICD-10-CM | POA: Insufficient documentation

## 2014-06-28 NOTE — Progress Notes (Signed)
reviewed and agree that colonoscopy should be repeated prior to 5 year interval. Since several polyps and one carpeted. I suggest 2 years interval from last colon. Barrett's follow up is on a 3 year  Recall  But may be done at the time of the colonoscopy.

## 2014-06-28 NOTE — Telephone Encounter (Signed)
Cannot afford Anusol suppositories. Please, advise.

## 2014-06-29 ENCOUNTER — Telehealth: Payer: Self-pay | Admitting: *Deleted

## 2014-06-29 NOTE — Telephone Encounter (Signed)
This is not preferred on patient's medication list.

## 2014-06-29 NOTE — Telephone Encounter (Signed)
Rollene Fare, lets try Proctosol-HC 2.5% BID x 7 days. Thanks

## 2014-06-29 NOTE — Telephone Encounter (Signed)
-----   Message from Willia Craze, NP sent at 06/29/2014  3:45 PM EST ----- Claiborne Billings, will you put patient on recall list for EGD/colonoscopy Aug 2016 and let him know. Thanks  ----- Message -----    From: Alfredia Ferguson, PA-C    Sent: 06/29/2014   3:20 PM      To: Willia Craze, NP    ----- Message -----    From: Lafayette Dragon, MD    Sent: 06/28/2014  10:53 PM      To: Alfredia Ferguson, PA-C    ----- Message -----    From: Willia Craze, NP    Sent: 06/28/2014  11:22 AM      To: Lafayette Dragon, MD

## 2014-06-29 NOTE — Telephone Encounter (Signed)
Changed recall date

## 2014-06-30 ENCOUNTER — Telehealth: Payer: Self-pay | Admitting: Nurse Practitioner

## 2014-06-30 MED ORDER — HYDROCORTISONE ACE-PRAMOXINE 1-1 % RE FOAM: RECTAL | Status: DC

## 2014-06-30 NOTE — Telephone Encounter (Signed)
Per Tye Savoy, NP, Proctofoam HC BID x 7 days. Rx sent. Patient aware.

## 2014-06-30 NOTE — Progress Notes (Signed)
HPI: FU CAD; I initially saw in Oct 2013 for evaluation of dyspnea. Note he also has a history of unexplained pulmonary embolus approximately 8 years ago treated with one year of Coumadin. Patient was admitted in October of 2013 and found to have a subdural hematoma which required evacuation. Abdominal ultrasound in October of 2014 showed a 3.3 x 3.0 cm abdominal aortic aneurysm. Followup recommended in one year. Seen for dyspnea 10/15. Cardiac catheterization October 2015 showed an 80% second diagonal but otherwise nonobstructive coronary disease. Ejection fraction 55-65%. Chest CT October 2015 showed bilateral pulmonary emboli.Echocardiogram October 2015 showed normal LV function. The right ventricle was dilated as was the right atrium. PA pressure elevated. Readmitted in December with fever and one blood culture grew coag-negative Staphylococcus. This was felt to be a contaminant. Since I last saw him, he denies chest pain; dyspnea has improved; complains of diarrhea.  Current Outpatient Prescriptions  Medication Sig Dispense Refill  . allopurinol (ZYLOPRIM) 300 MG tablet Take 300 mg by mouth daily.    Marland Kitchen aspirin EC 81 MG tablet Take 1 tablet (81 mg total) by mouth daily. 90 tablet 3  . hydrocortisone (ANUSOL-HC) 25 MG suppository Place 1 suppository (25 mg total) rectally every 12 (twelve) hours. 10 suppository 0  . hydrocortisone-pramoxine (PROCTOFOAM HC) rectal foam Use BID x 7 days 10 g 0  . nystatin cream (MYCOSTATIN) Apply 1 application topically 2 (two) times daily.   0  . omeprazole (PRILOSEC) 20 MG capsule Take 20 mg by mouth daily.    Marland Kitchen warfarin (COUMADIN) 5 MG tablet Take 1 tablet (5 mg total) by mouth as directed. (Patient taking differently: Take 10 mg by mouth as directed. ) 180 tablet 0   No current facility-administered medications for this visit.     Past Medical History  Diagnosis Date  . Gout   . GERD (gastroesophageal reflux disease)   . Barrett's esophagus 2009    . FH: colonic polyps   . Hypertension   . Pulmonary embolus 2005; 05/2014    Bilateral, coumadin x 1 years, inpatient for 5 days; hospitalized  . Subdural hematoma   . Infected sebaceous cyst back 7x5x4cm s/p I&D [id number] 01/11/2013  . History of radiation therapy 06/22/2012-08/18/2012    78 Gy to prostate  . AAA (abdominal aortic aneurysm)   . CAD (coronary artery disease)     Noted to have coronary calcification on CT  . Shortness of breath   . Pulmonary HTN 04/12/2014  . DVT (deep venous thrombosis) 05/2014    LLE  . Chronic lower back pain   . Prostate cancer 12/2011    Found on routine screening PSA, planning for implants by Dr. Sondra Come 10/16   . Colon polyps     hyperplastic, adenomatous  . Arthritis     "back" (05/26/2014)  . Diverticulosis   . Esophageal stricture   . Duodenitis     Past Surgical History  Procedure Laterality Date  . Craniotomy  04/03/2012    Procedure: CRANIOTOMY HEMATOMA EVACUATION SUBDURAL;  Surgeon: Erline Levine, MD;  Location: [place] [facility];  Service: Neurosurgery;  Laterality: Left;  Left Craniotomy for Evacuation of Subdural Hematoma  . Colonoscopy    . Polypectomy    . Irrigation and debridement sebaceous cyst  01-11-13    "off my back"  . Cardiac catheterization  04/12/2014  . Tonsillectomy  1940's  . Fracture surgery    . Orif hip fracture Left 09/2010    Archie Endo 10/03/2010  .  Rigid esophagoscopy  04/2008    food impaction/notes 06/13/2008  . Brain surgery      Archie Endo 04/03/2012  . Left and right heart catheterization with coronary angiogram N/A 04/12/2014    Procedure: LEFT AND RIGHT HEART CATHETERIZATION WITH CORONARY ANGIOGRAM;  Surgeon: Peter M Martinique, MD;  Location: [facility] CATH LAB;  Service: Cardiovascular;  Laterality: N/A;    History   Social History  . Marital Status: Married    Spouse Name: N/A    Number of Children: 3  . Years of Education: N/A   Occupational History  . Retired     Dance movement psychotherapist   Social History Main  Topics  . Smoking status: Former Smoker -- 1.00 packs/day for 20 years    Types: Cigarettes    Quit date: 12/23/1971  . Smokeless tobacco: Never Used  . Alcohol Use: 8.4 oz/week    14 Cans of beer per week     Comment: 05/26/2014 "couple beers qd"  . Drug Use: No  . Sexual Activity: Yes   Other Topics Concern  . Not on file   Social History Narrative   Originally from [place].  Retired to [employer] because he was tired of all the snow.    ROS: hemorrhoids and diarrhea as well as back pain but no fevers or chills, productive cough, hemoptysis, dysphasia, odynophagia, melena, dysuria, hematuria, rash, seizure activity, orthopnea, PND, pedal edema, claudication. Remaining systems are negative.  Physical Exam: Well-developed well-nourished in no acute distress.  Skin is warm and dry.  HEENT is normal.  Neck is supple.  Chest is clear to auscultation with normal expansion.  Cardiovascular exam is regular rate and rhythm.  Abdominal exam nontender or distended. No masses palpated. Extremities show no edema. neuro grossly intact

## 2014-07-01 ENCOUNTER — Ambulatory Visit (INDEPENDENT_AMBULATORY_CARE_PROVIDER_SITE_OTHER): Payer: Medicare Other | Admitting: Pharmacist Clinician (PhC)/ Clinical Pharmacy Specialist

## 2014-07-01 ENCOUNTER — Ambulatory Visit (INDEPENDENT_AMBULATORY_CARE_PROVIDER_SITE_OTHER): Payer: Medicare Other | Admitting: Cardiology

## 2014-07-01 ENCOUNTER — Encounter: Payer: Self-pay | Admitting: Cardiology

## 2014-07-01 VITALS — BP 142/80 | HR 90 | Ht 73.0 in | Wt 252.3 lb

## 2014-07-01 DIAGNOSIS — I2699 Other pulmonary embolism without acute cor pulmonale: Secondary | ICD-10-CM

## 2014-07-01 DIAGNOSIS — I824Z2 Acute embolism and thrombosis of unspecified deep veins of left distal lower extremity: Secondary | ICD-10-CM

## 2014-07-01 DIAGNOSIS — I714 Abdominal aortic aneurysm, without rupture, unspecified: Secondary | ICD-10-CM

## 2014-07-01 DIAGNOSIS — I270 Primary pulmonary hypertension: Secondary | ICD-10-CM

## 2014-07-01 DIAGNOSIS — I272 Pulmonary hypertension, unspecified: Secondary | ICD-10-CM

## 2014-07-01 DIAGNOSIS — I10 Essential (primary) hypertension: Secondary | ICD-10-CM

## 2014-07-01 DIAGNOSIS — R197 Diarrhea, unspecified: Secondary | ICD-10-CM

## 2014-07-01 LAB — POCT INR: INR: 5.5

## 2014-07-01 MED ORDER — LISINOPRIL 10 MG PO TABS: 10.0000 mg | ORAL_TABLET | Freq: Every day | ORAL | Status: DC

## 2014-07-01 NOTE — Assessment & Plan Note (Signed)
Patient has now had 2 pulmonary emboli. He will require lifelong Coumadin. Discontinue aspirin. We will consider changing to xarelto in the future. Given history of intracranial hemorrhage there is some risk with anticoagulation but at this point I feel there is no other option.

## 2014-07-01 NOTE — Telephone Encounter (Signed)
Spoke with patient and he wanted to let us know he got the [facility] with the Good Rx card for $15.

## 2014-07-01 NOTE — Assessment & Plan Note (Signed)
Patient is complaining of diarrhea. Recent antibiotic use. Check stool for C. Difficile.

## 2014-07-01 NOTE — Patient Instructions (Signed)
Your physician wants you to follow-up in: 3 [place] will receive a reminder letter in the mail two months in advance. If you don't receive a letter, please call our office to schedule the follow-up appointment.   Your physician has requested that you have an abdominal aorta duplex. During this test, an ultrasound is used to evaluate the aorta. Allow 30 minutes for this exam. Do not eat after midnight the day before and avoid carbonated beverages   STOP ASPIRIN  START LISINOPRIL 10 MG ONCE DAILY  Your physician recommends that you return for lab work in: [place]

## 2014-07-01 NOTE — Assessment & Plan Note (Signed)
Repeat abdominal ultrasound.

## 2014-07-01 NOTE — Assessment & Plan Note (Signed)
Declines statins. No aspirin given the Coumadin.

## 2014-07-01 NOTE — Assessment & Plan Note (Signed)
Blood pressure is elevated. This needs to be improved particularly in light of need for Coumadin with history of intracranial hemorrhage as well as abdominal aortic aneurysm. Add lisinopril 10 mg daily. Check potassium and renal function in 1 week.

## 2014-07-08 ENCOUNTER — Ambulatory Visit (HOSPITAL_COMMUNITY)
Admission: RE | Admit: 2014-07-08 | Discharge: 2014-07-08 | Disposition: A | Discharge status: Home / Self Care | Payer: Medicare Other | Source: Ambulatory Visit | Attending: Cardiovascular Disease | Admitting: Cardiovascular Disease

## 2014-07-08 ENCOUNTER — Ambulatory Visit (INDEPENDENT_AMBULATORY_CARE_PROVIDER_SITE_OTHER): Payer: Medicare Other | Admitting: Pharmacist Clinician (PhC)/ Clinical Pharmacy Specialist

## 2014-07-08 ENCOUNTER — Ambulatory Visit: Payer: Medicare Other | Admitting: Pharmacist Clinician (PhC)/ Clinical Pharmacy Specialist

## 2014-07-08 DIAGNOSIS — I272 Pulmonary hypertension, unspecified: Secondary | ICD-10-CM

## 2014-07-08 DIAGNOSIS — I714 Abdominal aortic aneurysm, without rupture, unspecified: Secondary | ICD-10-CM

## 2014-07-08 DIAGNOSIS — I824Z2 Acute embolism and thrombosis of unspecified deep veins of left distal lower extremity: Secondary | ICD-10-CM

## 2014-07-08 DIAGNOSIS — R197 Diarrhea, unspecified: Secondary | ICD-10-CM | POA: Diagnosis not present

## 2014-07-08 DIAGNOSIS — I10 Essential (primary) hypertension: Secondary | ICD-10-CM | POA: Diagnosis not present

## 2014-07-08 DIAGNOSIS — I270 Primary pulmonary hypertension: Secondary | ICD-10-CM

## 2014-07-08 DIAGNOSIS — I2699 Other pulmonary embolism without acute cor pulmonale: Secondary | ICD-10-CM

## 2014-07-08 LAB — BASIC METABOLIC PANEL WITH GFR
BUN: 23 mg/dL (ref 6–23)
CO2: 22 meq/L (ref 19–32)
Calcium: 8.2 mg/dL — ABNORMAL LOW (ref 8.4–10.5)
Chloride: 108 mEq/L (ref 96–112)
Creat: 0.8 mg/dL (ref 0.50–1.35)
GFR, Est African American: >89 mL/min
GFR, Est Non African American: 88 mL/min
GLUCOSE: 105 mg/dL — AB (ref 70–99)
Potassium: 4.3 mEq/L (ref 3.5–5.3)
Sodium: 142 mEq/L (ref 135–145)

## 2014-07-08 LAB — POCT INR: INR: 1.5

## 2014-07-08 NOTE — Progress Notes (Signed)
Abdominal Aorta Duplex Scan Completed. Stable infrarenal AAA with current measurements of 3.4 x 3.3 cm.  Oda Cogan, BS, RDMS, RVT

## 2014-07-13 LAB — CLOSTRIDIUM DIFFICILE CULTURE-FECAL

## 2014-07-15 ENCOUNTER — Ambulatory Visit: Payer: Medicare Other | Admitting: Pharmacist Clinician (PhC)/ Clinical Pharmacy Specialist

## 2014-07-20 NOTE — Progress Notes (Signed)
HPI: FU CAD; I initially saw in Oct 2013 for evaluation of dyspnea. Note he also has a history of unexplained pulmonary embolus years ago treated with one year of Coumadin. Patient was admitted in October of 2013 and found to have a subdural hematoma which required evacuation. Seen for dyspnea 10/15. Cardiac catheterization October 2015 showed an 80% second diagonal but otherwise nonobstructive coronary disease. Ejection fraction 55-65%. Chest CT October 2015 showed bilateral pulmonary emboli. Echocardiogram October 2015 showed normal LV function. The right ventricle was dilated as was the right atrium. PA pressure elevated. Readmitted in December with fever and one blood culture grew coag-negative Staphylococcus. This was felt to be a contaminant. Abdominal ultrasound in Jan 2016 showed a 3.3 x 3.4 cm abdominal aortic aneurysm. Followup recommended in one year. Since I last saw him, he denies chest pain; he does have some dyspnea on exertion which is unchanged compared to previous. He has some fatigue.  Current Outpatient Prescriptions  Medication Sig Dispense Refill  . allopurinol (ZYLOPRIM) 300 MG tablet Take 300 mg by mouth daily.    Marland Kitchen lisinopril (PRINIVIL,ZESTRIL) 10 MG tablet Take 1 tablet (10 mg total) by mouth daily. 90 tablet 3  . omeprazole (PRILOSEC) 20 MG capsule Take 20 mg by mouth daily.    Marland Kitchen warfarin (COUMADIN) 5 MG tablet Take 1 tablet (5 mg total) by mouth as directed. (Patient taking differently: Take 10 mg by mouth as directed. ) 180 tablet 0   No current facility-administered medications for this visit.     Past Medical History  Diagnosis Date  . Gout   . GERD (gastroesophageal reflux disease)   . Barrett's esophagus 2009  . FH: colonic polyps   . Hypertension   . Pulmonary embolus 2005; 05/2014    Bilateral, coumadin x 1 years, inpatient for 5 days; hospitalized  . Subdural hematoma   . Infected sebaceous cyst back 7x5x4cm s/p I&D [id number] 01/11/2013  . History of  radiation therapy 06/22/2012-08/18/2012    78 Gy to prostate  . AAA (abdominal aortic aneurysm)   . CAD (coronary artery disease)     Noted to have coronary calcification on CT  . Shortness of breath   . Pulmonary HTN 04/12/2014  . DVT (deep venous thrombosis) 05/2014    LLE  . Chronic lower back pain   . Prostate cancer 12/2011    Found on routine screening PSA, planning for implants by Dr. Sondra Come 10/16   . Colon polyps     hyperplastic, adenomatous  . Arthritis     "back" (05/26/2014)  . Diverticulosis   . Esophageal stricture   . Duodenitis     Past Surgical History  Procedure Laterality Date  . Craniotomy  04/03/2012    Procedure: CRANIOTOMY HEMATOMA EVACUATION SUBDURAL;  Surgeon: Erline Levine, MD;  Location: [place] [facility];  Service: Neurosurgery;  Laterality: Left;  Left Craniotomy for Evacuation of Subdural Hematoma  . Colonoscopy    . Polypectomy    . Irrigation and debridement sebaceous cyst  01-11-13    "off my back"  . Cardiac catheterization  04/12/2014  . Tonsillectomy  1940's  . Fracture surgery    . Orif hip fracture Left 09/2010    Archie Endo 10/03/2010  . Rigid esophagoscopy  04/2008    food impaction/notes 06/13/2008  . Brain surgery      Archie Endo 04/03/2012  . Left and right heart catheterization with coronary angiogram N/A 04/12/2014    Procedure: LEFT AND RIGHT HEART CATHETERIZATION  WITH CORONARY ANGIOGRAM;  Surgeon: Peter M Martinique, MD;  Location: [facility] CATH LAB;  Service: Cardiovascular;  Laterality: N/A;    History   Social History  . Marital Status: Married    Spouse Name: N/A    Number of Children: 3  . Years of Education: N/A   Occupational History  . Retired     Dance movement psychotherapist   Social History Main Topics  . Smoking status: Former Smoker -- 1.00 packs/day for 20 years    Types: Cigarettes    Quit date: 12/23/1971  . Smokeless tobacco: Never Used  . Alcohol Use: 8.4 oz/week    14 Cans of beer per week     Comment: 05/26/2014 "couple beers qd"   . Drug Use: No  . Sexual Activity: Yes   Other Topics Concern  . Not on file   Social History Narrative   Originally from [place].  Retired to [employer] because he was tired of all the snow.    ROS: no fevers or chills, productive cough, hemoptysis, dysphasia, odynophagia, melena, hematochezia, dysuria, hematuria, rash, seizure activity, orthopnea, PND, pedal edema, claudication. Remaining systems are negative.  Physical Exam: Well-developed well-nourished in no acute distress.  Skin is warm and dry.  HEENT is normal.  Neck is supple.  Chest is clear to auscultation with normal expansion.  Cardiovascular exam is regular rate and rhythm.  Abdominal exam nontender or distended. No masses palpated. Extremities show 1+ ankle edema. neuro grossly intact

## 2014-07-22 ENCOUNTER — Ambulatory Visit: Payer: Medicare Other | Admitting: Pharmacist Clinician (PhC)/ Clinical Pharmacy Specialist

## 2014-07-25 ENCOUNTER — Ambulatory Visit (INDEPENDENT_AMBULATORY_CARE_PROVIDER_SITE_OTHER): Payer: Medicare Other | Admitting: Cardiology

## 2014-07-25 ENCOUNTER — Encounter: Payer: Self-pay | Admitting: Cardiology

## 2014-07-25 ENCOUNTER — Ambulatory Visit (INDEPENDENT_AMBULATORY_CARE_PROVIDER_SITE_OTHER): Payer: Medicare Other | Admitting: Pharmacist Clinician (PhC)/ Clinical Pharmacy Specialist

## 2014-07-25 VITALS — BP 160/60 | HR 70 | Ht 73.0 in | Wt 252.3 lb

## 2014-07-25 DIAGNOSIS — I824Z2 Acute embolism and thrombosis of unspecified deep veins of left distal lower extremity: Secondary | ICD-10-CM

## 2014-07-25 DIAGNOSIS — I2583 Coronary atherosclerosis due to lipid rich plaque: Secondary | ICD-10-CM

## 2014-07-25 DIAGNOSIS — I714 Abdominal aortic aneurysm, without rupture, unspecified: Secondary | ICD-10-CM

## 2014-07-25 DIAGNOSIS — I2699 Other pulmonary embolism without acute cor pulmonale: Secondary | ICD-10-CM

## 2014-07-25 DIAGNOSIS — I270 Primary pulmonary hypertension: Secondary | ICD-10-CM

## 2014-07-25 DIAGNOSIS — I10 Essential (primary) hypertension: Secondary | ICD-10-CM

## 2014-07-25 DIAGNOSIS — I272 Pulmonary hypertension, unspecified: Secondary | ICD-10-CM

## 2014-07-25 DIAGNOSIS — R0600 Dyspnea, unspecified: Secondary | ICD-10-CM

## 2014-07-25 DIAGNOSIS — I2510 Atherosclerotic heart disease of native coronary artery without angina pectoris: Secondary | ICD-10-CM

## 2014-07-25 LAB — POCT INR: INR: 1.8

## 2014-07-25 MED ORDER — FUROSEMIDE 20 MG PO TABS: 20.0000 mg | ORAL_TABLET | Freq: Every day | ORAL | Status: DC

## 2014-07-25 NOTE — Patient Instructions (Signed)
Your physician recommends that you schedule a follow-up appointment in: [place]  START FUROSEMIDE 20 MG ONCE DAILY IN THE MORNING  Your physician recommends that you return for lab work in: [place]

## 2014-07-25 NOTE — Assessment & Plan Note (Signed)
Continue Coumadin. He is complaining of some dyspnea. Mild pedal edema on examination. Possible right heart failure from recent pulmonary embolus. Add Lasix 20 mg daily. Check potassium and renal function in 1 week. I will also check a TSH at that time as he is describing some fatigue.

## 2014-07-25 NOTE — Assessment & Plan Note (Signed)
Blood pressure mildly elevated. We are adding Lasix for edema. Follow blood pressure and adjust as needed.

## 2014-07-25 NOTE — Assessment & Plan Note (Signed)
Plan follow-up ultrasound January 2017.

## 2014-07-25 NOTE — Assessment & Plan Note (Signed)
Previously declined statins.

## 2014-08-02 LAB — TSH: TSH: 2.056 u[IU]/mL (ref 0.350–4.500)

## 2014-08-02 LAB — BASIC METABOLIC PANEL WITH GFR
BUN: 20 mg/dL (ref 6–23)
CALCIUM: 8.7 mg/dL (ref 8.4–10.5)
CO2: 24 meq/L (ref 19–32)
CREATININE: 0.81 mg/dL (ref 0.50–1.35)
Chloride: 103 mEq/L (ref 96–112)
GFR, Est African American: >89 mL/min
GFR, Est Non African American: 88 mL/min
Glucose, Bld: 84 mg/dL (ref 70–99)
Potassium: 4.4 mEq/L (ref 3.5–5.3)
Sodium: 139 mEq/L (ref 135–145)

## 2014-08-02 LAB — BRAIN NATRIURETIC PEPTIDE: BRAIN NATRIURETIC PEPTIDE: 30.3 pg/mL (ref 0.0–100.0)

## 2014-08-08 ENCOUNTER — Telehealth: Payer: Self-pay | Admitting: Cardiology

## 2014-08-08 ENCOUNTER — Ambulatory Visit: Payer: Medicare Other | Admitting: Pharmacist Clinician (PhC)/ Clinical Pharmacy Specialist

## 2014-08-08 MED ORDER — WARFARIN SODIUM 5 MG PO TABS: ORAL_TABLET | ORAL | Status: DC

## 2014-08-08 NOTE — Telephone Encounter (Signed)
FORWARD TO [facility]

## 2014-08-08 NOTE — Telephone Encounter (Signed)
Calling to get his [place] refill sent to [facility]. Please call if you have any questions

## 2014-08-12 ENCOUNTER — Ambulatory Visit (INDEPENDENT_AMBULATORY_CARE_PROVIDER_SITE_OTHER): Payer: Medicare Other | Admitting: Pharmacist Clinician (PhC)/ Clinical Pharmacy Specialist

## 2014-08-12 DIAGNOSIS — I270 Primary pulmonary hypertension: Secondary | ICD-10-CM

## 2014-08-12 DIAGNOSIS — I824Z2 Acute embolism and thrombosis of unspecified deep veins of left distal lower extremity: Secondary | ICD-10-CM

## 2014-08-12 DIAGNOSIS — I272 Pulmonary hypertension, unspecified: Secondary | ICD-10-CM

## 2014-08-12 DIAGNOSIS — I2699 Other pulmonary embolism without acute cor pulmonale: Secondary | ICD-10-CM

## 2014-08-12 LAB — POCT INR: INR: 1.9

## 2014-09-02 ENCOUNTER — Ambulatory Visit (INDEPENDENT_AMBULATORY_CARE_PROVIDER_SITE_OTHER): Payer: Medicare Other | Admitting: Pharmacist Clinician (PhC)/ Clinical Pharmacy Specialist

## 2014-09-02 DIAGNOSIS — I2699 Other pulmonary embolism without acute cor pulmonale: Secondary | ICD-10-CM

## 2014-09-02 DIAGNOSIS — I272 Pulmonary hypertension, unspecified: Secondary | ICD-10-CM

## 2014-09-02 DIAGNOSIS — I824Z2 Acute embolism and thrombosis of unspecified deep veins of left distal lower extremity: Secondary | ICD-10-CM

## 2014-09-02 DIAGNOSIS — I270 Primary pulmonary hypertension: Secondary | ICD-10-CM

## 2014-09-02 LAB — POCT INR: INR: 1.7

## 2014-09-16 ENCOUNTER — Ambulatory Visit (INDEPENDENT_AMBULATORY_CARE_PROVIDER_SITE_OTHER): Payer: Medicare Other | Admitting: Pharmacist Clinician (PhC)/ Clinical Pharmacy Specialist

## 2014-09-16 DIAGNOSIS — I272 Pulmonary hypertension, unspecified: Secondary | ICD-10-CM

## 2014-09-16 DIAGNOSIS — I270 Primary pulmonary hypertension: Secondary | ICD-10-CM

## 2014-09-16 DIAGNOSIS — I824Z2 Acute embolism and thrombosis of unspecified deep veins of left distal lower extremity: Secondary | ICD-10-CM

## 2014-09-16 DIAGNOSIS — I2699 Other pulmonary embolism without acute cor pulmonale: Secondary | ICD-10-CM | POA: Diagnosis not present

## 2014-09-16 LAB — POCT INR: INR: 1.8

## 2014-10-03 ENCOUNTER — Ambulatory Visit (INDEPENDENT_AMBULATORY_CARE_PROVIDER_SITE_OTHER): Payer: Medicare Other | Admitting: Pharmacist Clinician (PhC)/ Clinical Pharmacy Specialist

## 2014-10-03 DIAGNOSIS — I824Z2 Acute embolism and thrombosis of unspecified deep veins of left distal lower extremity: Secondary | ICD-10-CM

## 2014-10-03 DIAGNOSIS — I2699 Other pulmonary embolism without acute cor pulmonale: Secondary | ICD-10-CM | POA: Diagnosis not present

## 2014-10-03 DIAGNOSIS — I270 Primary pulmonary hypertension: Secondary | ICD-10-CM

## 2014-10-03 DIAGNOSIS — I272 Pulmonary hypertension, unspecified: Secondary | ICD-10-CM

## 2014-10-03 LAB — POCT INR: INR: 2

## 2014-10-21 NOTE — Progress Notes (Signed)
HPI: FU CAD; I initially saw in Oct 2013 for evaluation of dyspnea. Note he also has a history of unexplained pulmonary embolus years ago treated with one year of Coumadin. Patient was admitted in October of 2013 and found to have a subdural hematoma which required evacuation. Seen for dyspnea 10/15. Cardiac catheterization October 2015 showed an 80% second diagonal but otherwise nonobstructive coronary disease. Ejection fraction 55-65%. Chest CT October 2015 showed bilateral pulmonary emboli. Echocardiogram October 2015 showed normal LV function. The right ventricle was dilated as was the right atrium. PA pressure elevated. Abdominal ultrasound in Jan 2016 showed a 3.3 x 3.4 cm abdominal aortic aneurysm. Followup recommended in one year. Since I last saw him, he has mild dyspnea on exertion but no orthopnea, PND, chest pain, palpitations or syncope. Minimal pedal edema.  Current Outpatient Prescriptions  Medication Sig Dispense Refill  . allopurinol (ZYLOPRIM) 300 MG tablet Take 300 mg by mouth daily.    . furosemide (LASIX) 20 MG tablet Take 1 tablet (20 mg total) by mouth daily. 90 tablet 3  . lisinopril (PRINIVIL,ZESTRIL) 10 MG tablet Take 1 tablet (10 mg total) by mouth daily. 90 tablet 3  . omeprazole (PRILOSEC) 20 MG capsule Take 20 mg by mouth daily.    Marland Kitchen warfarin (COUMADIN) 5 MG tablet Take 1.5 to 2 tablets by mouth daily as directed by coumadin clinic 165 tablet 0   No current facility-administered medications for this visit.     Past Medical History  Diagnosis Date  . Gout   . GERD (gastroesophageal reflux disease)   . Barrett's esophagus 2009  . FH: colonic polyps   . Hypertension   . Pulmonary embolus 2005; 05/2014    Bilateral, coumadin x 1 years, inpatient for 5 days; hospitalized  . Subdural hematoma   . Infected sebaceous cyst back 7x5x4cm s/p I&D [id number] 01/11/2013  . History of radiation therapy 06/22/2012-08/18/2012    78 Gy to prostate  . AAA (abdominal aortic  aneurysm)   . CAD (coronary artery disease)     Noted to have coronary calcification on CT  . Shortness of breath   . Pulmonary HTN 04/12/2014  . DVT (deep venous thrombosis) 05/2014    LLE  . Chronic lower back pain   . Prostate cancer 12/2011    Found on routine screening PSA, planning for implants by Dr. Sondra Come 10/16   . Colon polyps     hyperplastic, adenomatous  . Arthritis     "back" (05/26/2014)  . Diverticulosis   . Esophageal stricture   . Duodenitis     Past Surgical History  Procedure Laterality Date  . Craniotomy  04/03/2012    Procedure: CRANIOTOMY HEMATOMA EVACUATION SUBDURAL;  Surgeon: Erline Levine, MD;  Location: [facility];  Service: Neurosurgery;  Laterality: Left;  Left Craniotomy for Evacuation of Subdural Hematoma  . Colonoscopy    . Polypectomy    . Irrigation and debridement sebaceous cyst  01-11-13    "off my back"  . Cardiac catheterization  04/12/2014  . Tonsillectomy  1940's  . Fracture surgery    . Orif hip fracture Left 09/2010    Archie Endo 10/03/2010  . Rigid esophagoscopy  04/2008    food impaction/notes 06/13/2008  . Brain surgery      Archie Endo 04/03/2012  . Left and right heart catheterization with coronary angiogram N/A 04/12/2014    Procedure: LEFT AND RIGHT HEART CATHETERIZATION WITH CORONARY ANGIOGRAM;  Surgeon: Peter M Martinique, MD;  Location:  [facility];  Service: Cardiovascular;  Laterality: N/A;    History   Social History  . Marital Status: Married    Spouse Name: N/A  . Number of Children: 3  . Years of Education: N/A   Occupational History  . Retired     Dance movement psychotherapist   Social History Main Topics  . Smoking status: Former Smoker -- 1.00 packs/day for 20 years    Types: Cigarettes    Quit date: 12/23/1971  . Smokeless tobacco: Never Used  . Alcohol Use: 8.4 oz/week    14 Cans of beer per week     Comment: 05/26/2014 "couple beers qd"  . Drug Use: No  . Sexual Activity: Yes   Other Topics Concern  . Not on file    Social History Narrative   Originally from [place].  Retired to [employer] because he was tired of all the snow.    ROS: no fevers or chills, productive cough, hemoptysis, dysphasia, odynophagia, melena, hematochezia, dysuria, hematuria, rash, seizure activity, orthopnea, PND, claudication. Remaining systems are negative.  Physical Exam: Well-developed well-nourished in no acute distress.  Skin is warm and dry.  HEENT is normal.  Neck is supple.  Chest is clear to auscultation with normal expansion.  Cardiovascular exam is regular rate and rhythm.  Abdominal exam nontender or distended. No masses palpated. Extremities show trace edema. neuro grossly intact  ECG NSR, RBBB, LAFB

## 2014-10-25 ENCOUNTER — Encounter: Payer: Self-pay | Admitting: Cardiology

## 2014-10-25 ENCOUNTER — Ambulatory Visit (INDEPENDENT_AMBULATORY_CARE_PROVIDER_SITE_OTHER): Payer: Medicare Other

## 2014-10-25 ENCOUNTER — Ambulatory Visit (INDEPENDENT_AMBULATORY_CARE_PROVIDER_SITE_OTHER): Payer: Medicare Other | Admitting: Cardiology

## 2014-10-25 VITALS — BP 136/80 | HR 80 | Ht 73.0 in | Wt 246.6 lb

## 2014-10-25 DIAGNOSIS — I2699 Other pulmonary embolism without acute cor pulmonale: Secondary | ICD-10-CM

## 2014-10-25 DIAGNOSIS — I714 Abdominal aortic aneurysm, without rupture, unspecified: Secondary | ICD-10-CM

## 2014-10-25 DIAGNOSIS — I10 Essential (primary) hypertension: Secondary | ICD-10-CM | POA: Diagnosis not present

## 2014-10-25 DIAGNOSIS — I272 Pulmonary hypertension, unspecified: Secondary | ICD-10-CM

## 2014-10-25 DIAGNOSIS — I824Z2 Acute embolism and thrombosis of unspecified deep veins of left distal lower extremity: Secondary | ICD-10-CM | POA: Diagnosis not present

## 2014-10-25 DIAGNOSIS — I270 Primary pulmonary hypertension: Secondary | ICD-10-CM

## 2014-10-25 DIAGNOSIS — I2510 Atherosclerotic heart disease of native coronary artery without angina pectoris: Secondary | ICD-10-CM

## 2014-10-25 LAB — POCT INR: INR: 1.6

## 2014-10-25 NOTE — Assessment & Plan Note (Signed)
Follow-up abdominal ultrasound January 2017. 

## 2014-10-25 NOTE — Assessment & Plan Note (Signed)
Blood pressure controlled. Continue present medications. 

## 2014-10-25 NOTE — Patient Instructions (Signed)
Your physician wants you to follow-up in: 6 MONTHS WITH DR CRENSHAW You will receive a reminder letter in the mail two months in advance. If you don't receive a letter, please call our office to schedule the follow-up appointment.  

## 2014-10-25 NOTE — Assessment & Plan Note (Signed)
Patient will need lifelong Coumadin.

## 2014-10-25 NOTE — Assessment & Plan Note (Signed)
Previously declined statins. Not on aspirin given need for Coumadin.

## 2014-10-26 ENCOUNTER — Telehealth: Payer: Self-pay | Admitting: *Deleted

## 2014-10-26 NOTE — Telephone Encounter (Signed)
Patient is interested in changing from warfarin to xarelto. Will forward for dr Stanford Breed review

## 2014-10-26 NOTE — Telephone Encounter (Signed)
Refer to coumadin clinic to change from coumadin to [place]

## 2014-10-27 ENCOUNTER — Ambulatory Visit: Payer: Self-pay | Admitting: Pharmacist Clinician (PhC)/ Clinical Pharmacy Specialist

## 2014-10-27 DIAGNOSIS — I272 Pulmonary hypertension, unspecified: Secondary | ICD-10-CM

## 2014-10-27 DIAGNOSIS — I2699 Other pulmonary embolism without acute cor pulmonale: Secondary | ICD-10-CM

## 2014-10-27 DIAGNOSIS — I824Z2 Acute embolism and thrombosis of unspecified deep veins of left distal lower extremity: Secondary | ICD-10-CM

## 2014-10-27 MED ORDER — RIVAROXABAN 20 MG PO TABS
20.0000 mg | ORAL_TABLET | Freq: Every day | ORAL | Status: DC
Start: 2014-10-27 — End: 2014-10-28

## 2014-10-27 NOTE — Addendum Note (Signed)
Addended by: Rockne Menghini on: 10/27/2014 04:35 PM   Modules accepted: Orders, Medications

## 2014-10-27 NOTE — Telephone Encounter (Signed)
Pt will switch to Xarelto 20 mg daily based on [facility].  Pt understands to stop coumadin and start this the next day.  (Will probably start on Monday).  He is to call with any concerns/questions.

## 2014-10-27 NOTE — Telephone Encounter (Signed)
LMOM to return call.

## 2014-10-28 ENCOUNTER — Other Ambulatory Visit: Payer: Self-pay | Admitting: Pharmacist Clinician (PhC)/ Clinical Pharmacy Specialist

## 2014-10-28 MED ORDER — RIVAROXABAN 20 MG PO TABS: 20.0000 mg | ORAL_TABLET | Freq: Every day | ORAL | Status: DC

## 2014-11-07 ENCOUNTER — Ambulatory Visit: Payer: Medicare Other | Admitting: Pharmacist Clinician (PhC)/ Clinical Pharmacy Specialist

## 2014-12-15 ENCOUNTER — Encounter: Payer: Self-pay | Admitting: Internal Medicine

## 2014-12-19 ENCOUNTER — Other Ambulatory Visit: Payer: Self-pay

## 2015-01-19 ENCOUNTER — Ambulatory Visit: Payer: Medicare Other | Admitting: Nurse Practitioner

## 2015-02-24 ENCOUNTER — Telehealth: Payer: Self-pay | Admitting: Cardiology

## 2015-02-24 NOTE — Telephone Encounter (Signed)
Calling about an event monitor , Saying that somone was supposed to call her about one .Marland KitchenPlease call

## 2015-02-24 NOTE — Telephone Encounter (Signed)
[name] callled and said someone had called her and mentioned him having an event monitor.  I don't see anything documented  Routing to Dr. Jacalyn Lefevre nurse Fredia Beets to see if she knows anything about it

## 2015-02-24 NOTE — Telephone Encounter (Signed)
Spoke with [name], she has made contact with the pt to schedule

## 2015-03-15 ENCOUNTER — Encounter: Payer: Self-pay | Admitting: Gastroenterology

## 2015-03-15 ENCOUNTER — Telehealth: Payer: Self-pay

## 2015-03-15 ENCOUNTER — Other Ambulatory Visit: Payer: Self-pay

## 2015-03-15 ENCOUNTER — Ambulatory Visit (INDEPENDENT_AMBULATORY_CARE_PROVIDER_SITE_OTHER): Payer: Medicare Other | Admitting: Gastroenterology

## 2015-03-15 VITALS — BP 134/70 | HR 96 | Ht 71.0 in | Wt 249.1 lb

## 2015-03-15 DIAGNOSIS — K625 Hemorrhage of anus and rectum: Secondary | ICD-10-CM

## 2015-03-15 DIAGNOSIS — Z86010 Personal history of colonic polyps: Secondary | ICD-10-CM

## 2015-03-15 DIAGNOSIS — K2270 Barrett's esophagus without dysplasia: Secondary | ICD-10-CM

## 2015-03-15 DIAGNOSIS — Z7189 Other specified counseling: Secondary | ICD-10-CM | POA: Diagnosis not present

## 2015-03-15 NOTE — Progress Notes (Signed)
HPI :  74 y/o male here for reassessment for colon polyps and recent rectal bleeding, as well as Barrett's esophagus. Followed by Dr. Olevia Perches previously for his screening and Barrett's. He has his colonoscopy in 2014, there were multiple polyps removed, although per report "90%" of polyps were removed however there was a difficult polypectomy in the right colon with some suspected residual adenomatous tissue and incomplete resection. He otherwise had a short segment of nondysplastic Barrett's and suspected GAVE.  He reports he has recently been having some blood in his stools, while on Xarelto. He is on Xarelto for history of PE, and is on lifelong anticoagulation.  He has had multiple DVTs in the past as well. He has red blood in the stools which can be sporadic, often about one day per month with blood. He is also on iron pills and makes his stool dark at baseline. He has no pain when he passes blood. He has one BM per day, loose in general, which is his baseline. No abdominal pains.   He also has a history of Barrett's esophagus, nondysplastic, which has been followed over time. He is taking omeprazole 20mg  daily. He denies any reflux sypmtoms at baseline and no breakthrough symptoms on this dosing. No dysphagia. No weight loss. Stable weight.      Past Medical History  Diagnosis Date  . Gout   . GERD (gastroesophageal reflux disease)   . Barrett's esophagus 2009  . FH: colonic polyps   . Hypertension   . Pulmonary embolus 2005; 05/2014    Bilateral, coumadin x 1 years, inpatient for 5 days; hospitalized  . Subdural hematoma   . Infected sebaceous cyst back 7x5x4cm s/p I&D [id number] 01/11/2013  . History of radiation therapy 06/22/2012-08/18/2012    78 Gy to prostate  . AAA (abdominal aortic aneurysm)   . CAD (coronary artery disease)     Noted to have coronary calcification on CT  . Shortness of breath   . Pulmonary HTN 04/12/2014  . DVT (deep venous thrombosis) 05/2014    LLE  .  Chronic lower back pain   . Prostate cancer 12/2011    Found on routine screening PSA, planning for implants by Dr. Sondra Come 10/16   . Colon polyps     hyperplastic, adenomatous  . Arthritis     "back" (05/26/2014)  . Diverticulosis   . Esophageal stricture   . Duodenitis      Past Surgical History  Procedure Laterality Date  . Craniotomy  04/03/2012    Procedure: CRANIOTOMY HEMATOMA EVACUATION SUBDURAL;  Surgeon: Erline Levine, MD;  Location: [facility];  Service: Neurosurgery;  Laterality: Left;  Left Craniotomy for Evacuation of Subdural Hematoma  . Colonoscopy    . Polypectomy    . Irrigation and debridement sebaceous cyst  01-11-13    "off my back"  . Cardiac catheterization  04/12/2014  . Tonsillectomy  1940's  . Fracture surgery    . Orif hip fracture Left 09/2010    Archie Endo 10/03/2010  . Rigid esophagoscopy  04/2008    food impaction/notes 06/13/2008  . Brain surgery      Archie Endo 04/03/2012  . Left and right heart catheterization with coronary angiogram N/A 04/12/2014    Procedure: LEFT AND RIGHT HEART CATHETERIZATION WITH CORONARY ANGIOGRAM;  Surgeon: Peter M Martinique, MD;  Location: [facility];  Service: Cardiovascular;  Laterality: N/A;   Family History  Problem Relation Age of Onset  . Heart disease  No family history  . Macular degeneration Mother   . Osteoarthritis Mother   . Aneurysm Father     d/o 65 yo, ruptured AAA  . Colon cancer Neg Hx   . Colon polyps Neg Hx   . Diabetes Neg Hx    Social History  Substance Use Topics  . Smoking status: Former Smoker -- 1.00 packs/day for 20 years    Types: Cigarettes    Quit date: 12/23/1971  . Smokeless tobacco: Never Used  . Alcohol Use: 8.4 oz/week    14 Cans of beer per week     Comment: 05/26/2014 "couple beers qd"   Current Outpatient Prescriptions  Medication Sig Dispense Refill  . allopurinol (ZYLOPRIM) 300 MG tablet Take 300 mg by mouth daily.    . celecoxib (CELEBREX) 200 MG capsule Take 1 capsule by  mouth daily.    . furosemide (LASIX) 20 MG tablet Take 1 tablet (20 mg total) by mouth daily. 90 tablet 3  . lisinopril (PRINIVIL,ZESTRIL) 10 MG tablet Take 1 tablet (10 mg total) by mouth daily. 90 tablet 3  . omeprazole (PRILOSEC) 20 MG capsule Take 20 mg by mouth daily.    . rivaroxaban (XARELTO) 20 MG TABS tablet Take 1 tablet (20 mg total) by mouth daily with supper. 90 tablet 1   No current facility-administered medications for this visit.   No Known Allergies   Review of Systems: All systems reviewed and negative except where noted in HPI.   No recent labs available for review. Remote CBC was normal.   Physical Exam: BP 134/70 mmHg  Pulse 96  Ht 5\' 11"  (1.803 m)  Wt 249 lb 2 oz (113.002 kg)  BMI 34.76 kg/m2 Constitutional: Pleasant,well-developed, male in no acute distress. HEENT: Normocephalic and atraumatic. Conjunctivae are normal. No scleral icterus. Neck supple.  Cardiovascular: Normal rate, regular rhythm.  Pulmonary/chest: Effort normal and breath sounds normal. No wheezing, rales or rhonchi. Abdominal: Soft, protuberant, nontender. Bowel sounds active throughout. There are no masses palpable. No hepatomegaly. Extremities: (+) 1 edema B LE Lymphadenopathy: No cervical adenopathy noted. Neurological: Alert and oriented to person place and time. Skin: Skin is warm and dry. No rashes noted. Psychiatric: Normal mood and affect. Behavior is normal.   ASSESSMENT AND PLAN: 74 y/o male here for reassessment for history of colon polyps and Barrett's esophagus. Currently on Xarelto for his history of DVT/PE.   His last colonoscopy was removed, multiple polyps removed, once of which had an incomplete resection, all were adenomas per pathology. He is in need of a surveillance colonoscopy and to remove residual adenoma noted on last colonoscopy. He has some periodic rectal bleeding which I suspect is due to hemorrhoids in the setting of Xarelto use, although he could have  radiation proctitis from prostate CA therapy, and will further evaluate this complaint with colonoscopy as well. I discussed risks / benefits of optical colonoscopy with him. In light of anticipated polypectomy, we will need him to hold his Xarelto for 3 days. We discussed risks of holding anticoagulation and our staff will reach out to his primary care to see if he will warrant a lovenox bridge during this time, which I suspect he will. Otherwise, we will schedule his case to be at [facility] in case APC is needed to ablate residual tissue if scar tissue is encountered at site of prior polypectomy and it doesn't lift well for complete resection.   Regarding his Barrett's, last exam in 2014, and due for surveillance next  year. He asked if we could perform this at same time as colonoscopy so he won't have to hold his Xarelto and go through this process again next year which is reasonable. Otherwise present dose of PPI appears to be controlling his symptoms well.   The indications, risks, and benefits of EGD and colonoscopy were explained to the patient in detail. Risks include but are not limited to bleeding, perforation, adverse reaction to medications, and cardiopulmonary compromise. Sequelae include but are not limited to the possibility of surgery, hositalization, and mortality. The patient verbalized understanding and wished to proceed. All questions answered, referred to scheduler and bowel prep ordered. Further recommendations pending results of the exam.   Wayland Cellar, MD [facility] Pager [phone]

## 2015-03-15 NOTE — Telephone Encounter (Signed)
03/15/2015   RE: [name] DOB: Dec 05, 1940 MRN: [id number]   Dear Dr. Stanford Breed,    We have scheduled the above patient for an endoscopic procedure. Our records show that he is on anticoagulation therapy.   Please advise as to how long the patient may come off his therapy of Xarelto prior to the procedure, which is scheduled for 04/25/2015. Does this patient need a Lovenox Bridge?  Please fax back/ or route the completed form to Amber  at [phone].   Sincerely,    Margreta Journey

## 2015-03-15 NOTE — Telephone Encounter (Signed)
DC xarelto 2 days prior to procedure and resume day after Brian Crenshaw  

## 2015-03-15 NOTE — Telephone Encounter (Signed)
No lovenox bridge Kirk Ruths

## 2015-03-15 NOTE — Addendum Note (Signed)
Addended by: Damian Leavell S on: 03/15/2015 02:51 PM   Modules accepted: Orders

## 2015-03-15 NOTE — Patient Instructions (Signed)
You have been scheduled for an endoscopy and colonoscopy. Please follow the written instructions given to you at your visit today. Please pick up your prep supplies at the pharmacy within the next 1-3 days. If you use inhalers (even only as needed), please bring them with you on the day of your procedure.  

## 2015-03-15 NOTE — Telephone Encounter (Signed)
Do you want pt to have a Lovenox Bridge?

## 2015-03-16 NOTE — Telephone Encounter (Signed)
Sounds good. Thanks for following up and getting clarification.

## 2015-03-16 NOTE — Telephone Encounter (Signed)
Dr. Stanford Breed states he does not need a Lovenox Bridge.

## 2015-04-04 NOTE — Progress Notes (Signed)
HPI: FU CAD; I initially saw in Oct 2013 for evaluation of dyspnea. Note he also has a history of unexplained pulmonary embolus years ago treated with one year of Coumadin. Patient was admitted in October of 2013 and found to have a subdural hematoma which required evacuation. Seen for dyspnea 10/15. Cardiac catheterization October 2015 showed an 80% second diagonal but otherwise nonobstructive coronary disease. Ejection fraction 55-65%. Chest CT October 2015 showed bilateral pulmonary emboli. Echocardiogram October 2015 showed normal LV function. The right ventricle was dilated as was the right atrium. PA pressure elevated. Abdominal ultrasound in Jan 2016 showed a 3.3 x 3.4 cm abdominal aortic aneurysm. Followup recommended in one year. Since I last saw him, He has mild dyspnea on exertion but no orthopnea, PND, pedal edema or chest pain.  Current Outpatient Prescriptions  Medication Sig Dispense Refill  . allopurinol (ZYLOPRIM) 300 MG tablet Take 300 mg by mouth daily.    . celecoxib (CELEBREX) 200 MG capsule Take 1 capsule by mouth 2 (two) times daily as needed for moderate pain.     . ferrous sulfate 325 (65 FE) MG tablet Take 325 mg by mouth daily with breakfast.    . furosemide (LASIX) 20 MG tablet Take 1 tablet (20 mg total) by mouth daily. 90 tablet 3  . lisinopril (PRINIVIL,ZESTRIL) 10 MG tablet Take 1 tablet (10 mg total) by mouth daily. 90 tablet 3  . omeprazole (PRILOSEC) 20 MG capsule Take 20 mg by mouth daily.    . rivaroxaban (XARELTO) 20 MG TABS tablet Take 1 tablet (20 mg total) by mouth daily with supper. 90 tablet 1   No current facility-administered medications for this visit.     Past Medical History  Diagnosis Date  . Gout   . GERD (gastroesophageal reflux disease)   . Barrett's esophagus 2009  . FH: colonic polyps   . Hypertension   . Pulmonary embolus ([place]) 2005; 05/2014    Bilateral, coumadin x 1 years, inpatient for 5 days; hospitalized  . Subdural  hematoma ([place])   . Infected sebaceous cyst back 7x5x4cm s/p I&D [id number] 01/11/2013  . History of radiation therapy 06/22/2012-08/18/2012    78 Gy to prostate  . AAA (abdominal aortic aneurysm) ([place])   . CAD (coronary artery disease)     Noted to have coronary calcification on CT  . Shortness of breath   . Pulmonary HTN ([place]) 04/12/2014  . DVT (deep venous thrombosis) ([place]) 05/2014    LLE  . Chronic lower back pain   . Prostate cancer ([place]) 12/2011    Found on routine screening PSA, planning for implants by Dr. Sondra Come 10/16   . Colon polyps     hyperplastic, adenomatous  . Arthritis     "back" (05/26/2014)  . Diverticulosis   . Esophageal stricture   . Duodenitis     Past Surgical History  Procedure Laterality Date  . Craniotomy  04/03/2012    Procedure: CRANIOTOMY HEMATOMA EVACUATION SUBDURAL;  Surgeon: Erline Levine, MD;  Location: [facility];  Service: Neurosurgery;  Laterality: Left;  Left Craniotomy for Evacuation of Subdural Hematoma  . Colonoscopy    . Polypectomy    . Irrigation and debridement sebaceous cyst  01-11-13    "off my back"  . Cardiac catheterization  04/12/2014  . Tonsillectomy  1940's  . Fracture surgery    . Orif hip fracture Left 09/2010    Archie Endo 10/03/2010  . Rigid esophagoscopy  04/2008    food  impaction/notes 06/13/2008  . Brain surgery      Archie Endo 04/03/2012  . Left and right heart catheterization with coronary angiogram N/A 04/12/2014    Procedure: LEFT AND RIGHT HEART CATHETERIZATION WITH CORONARY ANGIOGRAM;  Surgeon: Peter M Martinique, MD;  Location: [facility] CATH LAB;  Service: Cardiovascular;  Laterality: N/A;    Social History   Social History  . Marital Status: Married    Spouse Name: N/A  . Number of Children: 3  . Years of Education: N/A   Occupational History  . Retired     Dance movement psychotherapist   Social History Main Topics  . Smoking status: Former Smoker -- 1.00 packs/day for 20 years    Types: Cigarettes    Quit date: 12/23/1971  .  Smokeless tobacco: Never Used  . Alcohol Use: 8.4 oz/week    14 Cans of beer per week     Comment: 05/26/2014 "couple beers qd"  . Drug Use: No  . Sexual Activity: Yes   Other Topics Concern  . Not on file   Social History Narrative   Originally from [place].  Retired to [employer] because he was tired of all the snow.    ROS: back pain but no fevers or chills, productive cough, hemoptysis, dysphasia, odynophagia, melena, hematochezia, dysuria, hematuria, rash, seizure activity, orthopnea, PND, pedal edema, claudication. Remaining systems are negative.  Physical Exam: Well-developed well-nourished in no acute distress.  Skin is warm and dry.  HEENT is normal.  Neck is supple.  Chest is clear to auscultation with normal expansion.  Cardiovascular exam is regular rate and rhythm.  Abdominal exam nontender or distended. No masses palpated. Extremities show no edema. neuro grossly intact  ECG Sinus rhythm at a rate of 80. Left anterior fascicular block. Right bundle branch block.

## 2015-04-07 ENCOUNTER — Ambulatory Visit (INDEPENDENT_AMBULATORY_CARE_PROVIDER_SITE_OTHER): Payer: Medicare Other | Admitting: Cardiology

## 2015-04-07 ENCOUNTER — Encounter: Payer: Self-pay | Admitting: Cardiology

## 2015-04-07 VITALS — BP 146/88 | Ht 73.0 in | Wt 247.0 lb

## 2015-04-07 DIAGNOSIS — I714 Abdominal aortic aneurysm, without rupture, unspecified: Secondary | ICD-10-CM

## 2015-04-07 DIAGNOSIS — I10 Essential (primary) hypertension: Secondary | ICD-10-CM | POA: Diagnosis not present

## 2015-04-07 DIAGNOSIS — I2699 Other pulmonary embolism without acute cor pulmonale: Secondary | ICD-10-CM

## 2015-04-07 DIAGNOSIS — I2510 Atherosclerotic heart disease of native coronary artery without angina pectoris: Secondary | ICD-10-CM

## 2015-04-07 DIAGNOSIS — I2583 Coronary atherosclerosis due to lipid rich plaque: Secondary | ICD-10-CM

## 2015-04-07 DIAGNOSIS — Z86010 Personal history of colonic polyps: Secondary | ICD-10-CM

## 2015-04-07 NOTE — Assessment & Plan Note (Signed)
Patient with history of colon polyps. He is scheduled for colonoscopy. I've asked him to discontinue xarelto 2 days prior to the procedure. If no polypectomy performed he will resume the day after.

## 2015-04-07 NOTE — Assessment & Plan Note (Signed)
Follow-up abdominal ultrasound January 2017. 

## 2015-04-07 NOTE — Assessment & Plan Note (Signed)
Patient will need lifelong Coumadin. 

## 2015-04-07 NOTE — Patient Instructions (Addendum)
Your physician wants you to follow-up in: [place] will receive a reminder letter in the mail two months in advance. If you don't receive a letter, please call our office to schedule the follow-up appointment.   Your physician has requested that you have an abdominal aorta duplex. During this test, an ultrasound is used to evaluate the aorta. Allow 30 minutes for this exam. Do not eat after midnight the day before and avoid carbonated beverages DUE IN [place]

## 2015-04-07 NOTE — Assessment & Plan Note (Signed)
Not on aspirin given need for Coumadin. Declines statins.

## 2015-04-07 NOTE — Assessment & Plan Note (Signed)
Blood pressure controlled. Continue present medications. 

## 2015-04-21 ENCOUNTER — Other Ambulatory Visit: Payer: Self-pay | Admitting: Cardiology

## 2015-04-25 ENCOUNTER — Ambulatory Visit (HOSPITAL_COMMUNITY)
Admission: RE | Admit: 2015-04-25 | Discharge: 2015-04-25 | Disposition: A | Discharge status: Home / Self Care | Payer: Medicare Other | Source: Ambulatory Visit | Attending: Gastroenterology | Admitting: Gastroenterology

## 2015-04-25 ENCOUNTER — Encounter (HOSPITAL_COMMUNITY)
Admission: RE | Disposition: A | Discharge status: Home / Self Care | Payer: Self-pay | Source: Ambulatory Visit | Attending: Gastroenterology

## 2015-04-25 ENCOUNTER — Ambulatory Visit (HOSPITAL_COMMUNITY): Payer: Medicare Other | Admitting: Registered Nurse

## 2015-04-25 ENCOUNTER — Ambulatory Visit: Payer: Medicare Other | Admitting: Cardiology

## 2015-04-25 ENCOUNTER — Encounter (HOSPITAL_COMMUNITY): Payer: Self-pay

## 2015-04-25 DIAGNOSIS — D125 Benign neoplasm of sigmoid colon: Secondary | ICD-10-CM | POA: Diagnosis not present

## 2015-04-25 DIAGNOSIS — K627 Radiation proctitis: Secondary | ICD-10-CM | POA: Diagnosis not present

## 2015-04-25 DIAGNOSIS — I739 Peripheral vascular disease, unspecified: Secondary | ICD-10-CM | POA: Diagnosis not present

## 2015-04-25 DIAGNOSIS — K219 Gastro-esophageal reflux disease without esophagitis: Secondary | ICD-10-CM | POA: Insufficient documentation

## 2015-04-25 DIAGNOSIS — K5730 Diverticulosis of large intestine without perforation or abscess without bleeding: Secondary | ICD-10-CM | POA: Insufficient documentation

## 2015-04-25 DIAGNOSIS — Z86010 Personal history of colonic polyps: Secondary | ICD-10-CM | POA: Diagnosis not present

## 2015-04-25 DIAGNOSIS — I2510 Atherosclerotic heart disease of native coronary artery without angina pectoris: Secondary | ICD-10-CM | POA: Insufficient documentation

## 2015-04-25 DIAGNOSIS — I714 Abdominal aortic aneurysm, without rupture: Secondary | ICD-10-CM | POA: Insufficient documentation

## 2015-04-25 DIAGNOSIS — D122 Benign neoplasm of ascending colon: Secondary | ICD-10-CM | POA: Diagnosis not present

## 2015-04-25 DIAGNOSIS — Z7189 Other specified counseling: Secondary | ICD-10-CM

## 2015-04-25 DIAGNOSIS — Z86711 Personal history of pulmonary embolism: Secondary | ICD-10-CM | POA: Insufficient documentation

## 2015-04-25 DIAGNOSIS — Z8371 Family history of colonic polyps: Secondary | ICD-10-CM | POA: Diagnosis not present

## 2015-04-25 DIAGNOSIS — M109 Gout, unspecified: Secondary | ICD-10-CM | POA: Insufficient documentation

## 2015-04-25 DIAGNOSIS — Z87891 Personal history of nicotine dependence: Secondary | ICD-10-CM | POA: Diagnosis not present

## 2015-04-25 DIAGNOSIS — K449 Diaphragmatic hernia without obstruction or gangrene: Secondary | ICD-10-CM | POA: Insufficient documentation

## 2015-04-25 DIAGNOSIS — W881XXA Exposure to radioactive isotopes, initial encounter: Secondary | ICD-10-CM | POA: Insufficient documentation

## 2015-04-25 DIAGNOSIS — Z79899 Other long term (current) drug therapy: Secondary | ICD-10-CM | POA: Insufficient documentation

## 2015-04-25 DIAGNOSIS — K625 Hemorrhage of anus and rectum: Secondary | ICD-10-CM | POA: Diagnosis not present

## 2015-04-25 DIAGNOSIS — K2270 Barrett's esophagus without dysplasia: Secondary | ICD-10-CM | POA: Diagnosis not present

## 2015-04-25 DIAGNOSIS — Z7901 Long term (current) use of anticoagulants: Secondary | ICD-10-CM | POA: Diagnosis not present

## 2015-04-25 DIAGNOSIS — I272 Other secondary pulmonary hypertension: Secondary | ICD-10-CM | POA: Diagnosis not present

## 2015-04-25 DIAGNOSIS — Z8546 Personal history of malignant neoplasm of prostate: Secondary | ICD-10-CM | POA: Insufficient documentation

## 2015-04-25 DIAGNOSIS — Z1211 Encounter for screening for malignant neoplasm of colon: Secondary | ICD-10-CM | POA: Diagnosis not present

## 2015-04-25 DIAGNOSIS — M4690 Unspecified inflammatory spondylopathy, site unspecified: Secondary | ICD-10-CM | POA: Insufficient documentation

## 2015-04-25 DIAGNOSIS — K317 Polyp of stomach and duodenum: Secondary | ICD-10-CM | POA: Insufficient documentation

## 2015-04-25 HISTORY — PX: COLONOSCOPY WITH PROPOFOL: SHX5780

## 2015-04-25 HISTORY — PX: ESOPHAGOGASTRODUODENOSCOPY (EGD) WITH PROPOFOL: SHX5813

## 2015-04-25 SURGERY — ESOPHAGOGASTRODUODENOSCOPY (EGD) WITH PROPOFOL
Anesthesia: Monitor Anesthesia Care

## 2015-04-25 MED ORDER — PROPOFOL 10 MG/ML IV BOLUS
INTRAVENOUS | Status: AC
  Filled 2015-04-25: qty 20

## 2015-04-25 MED ORDER — PHENYLEPHRINE HCL 10 MG/ML IJ SOLN
INTRAMUSCULAR | Status: DC | PRN
  Administered 2015-04-25: 120 ug via INTRAVENOUS
  Administered 2015-04-25 (×2): 160 ug via INTRAVENOUS
  Administered 2015-04-25 (×2): 80 ug via INTRAVENOUS
  Administered 2015-04-25 (×2): 120 ug via INTRAVENOUS
  Administered 2015-04-25: 80 ug via INTRAVENOUS

## 2015-04-25 MED ORDER — PHENYLEPHRINE 40 MCG/ML (10ML) SYRINGE FOR IV PUSH (FOR BLOOD PRESSURE SUPPORT)
PREFILLED_SYRINGE | INTRAVENOUS | Status: AC
  Filled 2015-04-25: qty 30

## 2015-04-25 MED ORDER — PHENYLEPHRINE HCL 10 MG/ML IJ SOLN
INTRAMUSCULAR | Status: AC
  Filled 2015-04-25: qty 1

## 2015-04-25 MED ORDER — GLYCOPYRROLATE 0.2 MG/ML IJ SOLN
INTRAMUSCULAR | Status: DC | PRN
  Administered 2015-04-25: 0.2 mg via INTRAVENOUS

## 2015-04-25 MED ORDER — SPOT INK MARKER SYRINGE KIT
PACK | SUBMUCOSAL | Status: AC
  Filled 2015-04-25: qty 5

## 2015-04-25 MED ORDER — SODIUM CHLORIDE 0.9 % IV SOLN: INTRAVENOUS | Status: DC

## 2015-04-25 MED ORDER — LACTATED RINGERS IV SOLN
INTRAVENOUS | Status: DC
  Administered 2015-04-25: 10:00:00 via INTRAVENOUS

## 2015-04-25 MED ORDER — SODIUM CHLORIDE 0.9 % IV SOLN
INTRAVENOUS | Status: DC | PRN
  Administered 2015-04-25: 11:00:00 via INTRAVENOUS

## 2015-04-25 MED ORDER — PROPOFOL 500 MG/50ML IV EMUL
INTRAVENOUS | Status: DC | PRN
  Administered 2015-04-25: 200 ug/kg/min via INTRAVENOUS

## 2015-04-25 MED ORDER — LIDOCAINE HCL (CARDIAC) 20 MG/ML IV SOLN
INTRAVENOUS | Status: DC | PRN
  Administered 2015-04-25: 50 mg via INTRAVENOUS

## 2015-04-25 MED ORDER — LIDOCAINE HCL (CARDIAC) 20 MG/ML IV SOLN
INTRAVENOUS | Status: AC
  Filled 2015-04-25: qty 5

## 2015-04-25 MED ORDER — SPOT INK MARKER SYRINGE KIT
PACK | SUBMUCOSAL | Status: DC | PRN
  Administered 2015-04-25: 4 mL via SUBMUCOSAL

## 2015-04-25 MED ORDER — GLYCOPYRROLATE 0.2 MG/ML IJ SOLN
INTRAMUSCULAR | Status: AC
  Filled 2015-04-25: qty 1

## 2015-04-25 NOTE — H&P (Signed)
HPI :  74 y/o male here for reassessment for colon polyps and recent rectal bleeding, as well as Barrett's esophagus. Followed by Dr. Olevia Perches previously for his screening and Barrett's. He has his colonoscopy in 2014, there were multiple polyps removed, although per report "90%" of polyps were removed however there was a difficult polypectomy in the right colon with some suspected residual adenomatous tissue and incomplete resection. He otherwise had a short segment of nondysplastic Barrett's and suspected GAVE.  He reports he has recently been having some blood in his stools, while on Xarelto. He is on Xarelto for history of PE, and is on lifelong anticoagulation. He has had multiple DVTs in the past as well. He has red blood in the stools which can be sporadic, often about one day per month with blood. He is also on iron pills and makes his stool dark at baseline. He has no pain when he passes blood. He has one BM per day, loose in general, which is his baseline. No abdominal pains.   He also has a history of Barrett's esophagus, nondysplastic, which has been followed over time. He is taking omeprazole 20mg  daily. He denies any reflux sypmtoms at baseline and no breakthrough symptoms on this dosing. No dysphagia. No weight loss. Stable weight.     Past Medical History  Diagnosis Date  . Gout   . GERD (gastroesophageal reflux disease)   . Barrett's esophagus 2009  . FH: colonic polyps   . Hypertension   . Pulmonary embolus 2005; 05/2014    Bilateral, coumadin x 1 years, inpatient for 5 days; hospitalized  . Subdural hematoma   . Infected sebaceous cyst back 7x5x4cm s/p I&D [id number] 01/11/2013  . History of radiation therapy 06/22/2012-08/18/2012    74 Gy to prostate  . AAA (abdominal aortic aneurysm)   . CAD (coronary artery disease)     Noted to have coronary calcification on CT  . Shortness of breath   . Pulmonary HTN 04/12/2014    . DVT (deep venous thrombosis) 05/2014    LLE  . Chronic lower back pain   . Prostate cancer 12/2011    Found on routine screening PSA, planning for implants by Dr. Sondra Come 10/16   . Colon polyps     hyperplastic, adenomatous  . Arthritis     "back" (05/26/2014)  . Diverticulosis   . Esophageal stricture   . Duodenitis      Past Surgical History  Procedure Laterality Date  . Craniotomy  04/03/2012    Procedure: CRANIOTOMY HEMATOMA EVACUATION SUBDURAL; Surgeon: Erline Levine, MD; Location: [facility]; Service: Neurosurgery; Laterality: Left; Left Craniotomy for Evacuation of Subdural Hematoma  . Colonoscopy    . Polypectomy    . Irrigation and debridement sebaceous cyst  01-11-13    "off my back"  . Cardiac catheterization  04/12/2014  . Tonsillectomy  1940's  . Fracture surgery    . Orif hip fracture Left 09/2010    Archie Endo 10/03/2010  . Rigid esophagoscopy  04/2008    food impaction/notes 06/13/2008  . Brain surgery      Ar[facility] Endo 04/03/2012  . Left and right heart catheterization with coronary angiogram N/A 04/12/2014    Procedure: LEFT AND RIGHT HEART CATHETERIZATION WITH CORONARY ANGIOGRAM; Surgeon: Peter M Martinique, MD; Location: [facility]; Service: Cardiovascular; Laterality: N/A;   Family History  Problem Relation Age of Onset  . Heart disease      No family history  . Macular degeneration Mother   .  Osteoarthritis Mother   . Aneurysm Father     d/o 73 yo, ruptured AAA  . Colon cancer Neg Hx   . Colon polyps Neg Hx   . Diabetes Neg Hx    Social History  Substance Use Topics  . Smoking status: Former Smoker -- 1.00 packs/day for 20 years    Types: Cigarettes    Quit date: 12/23/1971  . Smokeless tobacco: Never Used  . Alcohol Use: 8.4 oz/week    14 Cans of beer per week     Comment:  05/26/2014 "couple beers qd"   Current Outpatient Prescriptions  Medication Sig Dispense Refill  . allopurinol (ZYLOPRIM) 300 MG tablet Take 300 mg by mouth daily.    . celecoxib (CELEBREX) 200 MG capsule Take 1 capsule by mouth daily.    . furosemide (LASIX) 20 MG tablet Take 1 tablet (20 mg total) by mouth daily. 90 tablet 3  . lisinopril (PRINIVIL,ZESTRIL) 10 MG tablet Take 1 tablet (10 mg total) by mouth daily. 90 tablet 3  . omeprazole (PRILOSEC) 20 MG capsule Take 20 mg by mouth daily.    . rivaroxaban (XARELTO) 20 MG TABS tablet Take 1 tablet (20 mg total) by mouth daily with supper. 90 tablet 1   No current facility-administered medications for this visit.   No Known Allergies   Review of Systems: All systems reviewed and negative except where noted in HPI.   No recent labs available for review. Remote CBC was normal.   Physical Exam: BP 134/70 mmHg  Pulse 96  Ht 5\' 11"  (1.803 m)  Wt 249 lb 2 oz (113.002 kg)  BMI 34.76 kg/m2 Constitutional: Pleasant,well-developed, male in no acute distress. HEENT: Normocephalic and atraumatic. Conjunctivae are normal. No scleral icterus. Neck supple.  Cardiovascular: Normal rate, regular rhythm.  Pulmonary/chest: Effort normal and breath sounds normal. No wheezing, rales or rhonchi. Abdominal: Soft, protuberant, nontender. Bowel sounds active throughout. There are no masses palpable. No hepatomegaly. Extremities: (+) 1 edema B LE Lymphadenopathy: No cervical adenopathy noted. Neurological: Alert and oriented to person place and time. Skin: Skin is warm and dry. No rashes noted. Psychiatric: Normal mood and affect. Behavior is normal.   ASSESSMENT AND PLAN: 74 y/o male here for reassessment for history of colon polyps and Barrett's esophagus. Currently on Xarelto for his history of DVT/PE.   His last colonoscopy was removed, multiple polyps removed, once of which had an incomplete resection,  all were adenomas per pathology. He is in need of a surveillance colonoscopy and to remove residual adenoma noted on last colonoscopy. He has some periodic rectal bleeding which I suspect is due to hemorrhoids in the setting of Xarelto use, although he could have radiation proctitis from prostate CA therapy, and will further evaluate this complaint with colonoscopy as well. I discussed risks / benefits of optical colonoscopy with him. In light of anticipated polypectomy, we will need him to hold his Xarelto for 3 days. We discussed risks of holding anticoagulation and our staff will reach out to his primary care to see if he will warrant a lovenox bridge during this time, which I suspect he will. Otherwise, we will schedule his case to be at [facility] in case APC is needed to ablate residual tissue if scar tissue is encountered at site of prior polypectomy and it doesn't lift well for complete resection.   Regarding his Barrett's, last exam in 2014, and due for surveillance next year. He asked if we could perform this at same time  as colonoscopy so he won't have to hold his Xarelto and go through this process again next year which is reasonable. Otherwise present dose of PPI appears to be controlling his symptoms well.   The indications, risks, and benefits of EGD and colonoscopy were explained to the patient in detail. Risks include but are not limited to bleeding, perforation, adverse reaction to medications, and cardiopulmonary compromise. Sequelae include but are not limited to the possibility of surgery, hositalization, and mortality. The patient verbalized understanding and wished to proceed. All questions answered, referred to scheduler and bowel prep ordered. Further recommendations pending results of the exam.   Odessa Cellar, MD [facility] Pager [phone]   THIS NOTE IS TO UP TO DATE H/P - no interval changes

## 2015-04-25 NOTE — Anesthesia Postprocedure Evaluation (Signed)
  Anesthesia Post-op Note  Patient: [name]  Procedure(s) Performed: Procedure(s) (LRB): ESOPHAGOGASTRODUODENOSCOPY (EGD) WITH PROPOFOL (N/A) COLONOSCOPY WITH PROPOFOL (N/A)  Patient Location: PACU  Anesthesia Type: MAC  Level of Consciousness: awake and alert   Airway and Oxygen Therapy: Patient Spontanous Breathing  Post-op Pain: mild  Post-op Assessment: Post-op Vital signs reviewed, Patient's Cardiovascular Status Stable, Respiratory Function Stable, Patent Airway and No signs of Nausea or vomiting  Last Vitals:  Filed Vitals:   04/25/15 1210  BP: 158/82  Pulse: 64  Temp:   Resp: 17    Post-op Vital Signs: stable   Complications: No apparent anesthesia complications

## 2015-04-25 NOTE — Transfer of Care (Signed)
Immediate Anesthesia Transfer of Care Note  Patient: [name]  Procedure(s) Performed: Procedure(s): ESOPHAGOGASTRODUODENOSCOPY (EGD) WITH PROPOFOL (N/A) COLONOSCOPY WITH PROPOFOL (N/A)  Patient Location: PACU  Anesthesia Type:MAC  Level of Consciousness: awake, alert , oriented and patient cooperative  Airway & Oxygen Therapy: Patient Spontanous Breathing and Patient connected to nasal cannula oxygen  Post-op Assessment: Report given to RN, Post -op Vital signs reviewed and stable and Patient moving all extremities X 4  Post vital signs: stable  Last Vitals:  Filed Vitals:   04/25/15 1148  BP: 119/67  Pulse: 80  Temp:   Resp: 18    Complications: No apparent anesthesia complications

## 2015-04-25 NOTE — Discharge Instructions (Signed)
Esophagogastroduodenoscopy, Care After °Refer to this sheet in the next few weeks. These instructions provide you with information about caring for yourself after your procedure. Your health care provider may also give you more specific instructions. Your treatment has been planned according to current medical practices, but problems sometimes occur. Call your health care provider if you have any problems or questions after your procedure. °WHAT TO EXPECT AFTER THE PROCEDURE °After your procedure, it is typical to feel: °· Soreness in your throat. °· Pain with swallowing. °· Sick to your stomach (nauseous). °· Bloated. °· Dizzy. °· Fatigued. °HOME CARE INSTRUCTIONS °· Do not eat or drink anything until the numbing medicine (local anesthetic) has worn off and your gag reflex has returned. You will know that the local anesthetic has worn off when you can swallow comfortably. °· Do not drive or operate machinery until directed by your health care provider. °· Take medicines only as directed by your health care provider. °SEEK MEDICAL CARE IF:  °· You cannot stop coughing. °· You are not urinating at all or less than usual. °SEEK IMMEDIATE MEDICAL CARE IF: °· You have difficulty swallowing. °· You cannot eat or drink. °· You have worsening throat or chest pain. °· You have dizziness or lightheadedness or you faint. °· You have nausea or vomiting. °· You have chills. °· You have a fever. °· You have severe abdominal pain. °· You have black, tarry, or bloody stools. °  °This information is not intended to replace advice given to you by your health care provider. Make sure you discuss any questions you have with your health care provider. °  °Document Released: 05/27/2012 Document Revised: 07/01/2014 Document Reviewed: 05/27/2012 °Elsevier Interactive Patient Education ©2016 [employer] °Colonoscopy, Care After °Refer to this sheet in the next few weeks. These instructions provide you with information on caring for  yourself after your procedure. Your health care provider may also give you more specific instructions. Your treatment has been planned according to current medical practices, but problems sometimes occur. Call your health care provider if you have any problems or questions after your procedure. °WHAT TO EXPECT AFTER THE PROCEDURE  °After your procedure, it is typical to have the following: °· A small amount of blood in your stool. °· Moderate amounts of gas and mild abdominal cramping or bloating. °HOME CARE INSTRUCTIONS °· Do not drive, operate machinery, or sign important documents for 24 hours. °· You may shower and resume your regular physical activities, but move at a slower pace for the first 24 hours. °· Take frequent rest periods for the first 24 hours. °· Walk around or put a warm pack on your abdomen to help reduce abdominal cramping and bloating. °· Drink enough fluids to keep your urine clear or pale yellow. °· You may resume your normal diet as instructed by your health care provider. Avoid heavy or fried foods that are hard to digest. °· Avoid drinking alcohol for 24 hours or as instructed by your health care provider. °· Only take over-the-counter or prescription medicines as directed by your health care provider. °· If a tissue sample (biopsy) was taken during your procedure: °· Do not take aspirin or blood thinners for 7 days, or as instructed by your health care provider. °· Do not drink alcohol for 7 days, or as instructed by your health care provider. °· Eat soft foods for the first 24 hours. °SEEK MEDICAL CARE IF: °You have persistent spotting of blood in your stool 2-3 days   after the procedure. °SEEK IMMEDIATE MEDICAL CARE IF: °· You have more than a small spotting of blood in your stool. °· You pass large blood clots in your stool. °· Your abdomen is swollen (distended). °· You have nausea or vomiting. °· You have a fever. °· You have increasing abdominal pain that is not relieved with  medicine. °  °This information is not intended to replace advice given to you by your health care provider. Make sure you discuss any questions you have with your health care provider. °  °Document Released: 01/23/2004 Document Revised: 03/31/2013 Document Reviewed: 02/15/2013 °Elsevier Interactive Patient Education ©2016 Elsevier Inc. ° °

## 2015-04-25 NOTE — Op Note (Signed)
[facility] [place], [address]   COLONOSCOPY PROCEDURE REPORT  PATIENT: Keil, Pickering  MR#: [id number] BIRTHDATE: 07-Mar-1941 , 21  yrs. old GENDER: male ENDOSCOPIST: Yetta Flock, MD REFERRED BY: PROCEDURE DATE:  04/25/2015 PROCEDURE:   Colonoscopy, surveillance , Colonoscopy with snare polypectomy, Colonoscopy with biopsy, Submucosal injection, any substance, and Colonoscopy with ablation First Screening Colonoscopy - Avg.  risk and is 50 yrs.  old or older - No.  Prior Negative Screening - Now for repeat screening. N/A  History of Adenoma - Now for follow-up colonoscopy & has been > or = to 3 yrs.  No.  It has been less than 3 yrs since last colonoscopy.  Medical reason.  Polyps removed today? Yes ASA CLASS:   Class III INDICATIONS:Surveillance due to prior colonic neoplasia and Colorectal Neoplasm Risk Assessment for this procedure is average risk. MEDICATIONS: Per Anesthesia  DESCRIPTION OF PROCEDURE:   After the risks benefits and alternatives of the procedure were thoroughly explained, informed consent was obtained.  The digital rectal exam revealed no abnormalities of the rectum.   The Pentax Ped Colon [id number] endoscope was introduced through the anus and advanced to the cecum, which was identified by both the appendix and ileocecal valve. No adverse events experienced.   The quality of the prep was adequate  The instrument was then slowly withdrawn as the colon was fully examined. Estimated blood loss is zero unless otherwise noted in this procedure report.      COLON FINDINGS: Severe diverticulosis was noted in the left colon making for a technically challenging intubation of the cecum.  Two x 51mm sessile polyps were noted in the ascending colon and removed with cold forceps.  A roughly 10-78mm sessile polyp was noted in the ascending colon, suspected to be at a prior polypectomy site. It was lifted with saline and then  removed with hot snare in piecemeal as it was flat and difficult to remove.  2 hemostasis clips were prophylactically placed to closed the polypectomy defect given the patient's need for anticoagulation.  A large laterally spreading, roughly 3-4 cm polyp was noted in the ascending colon, taking up approximately > 40% of the colon lumen.  Removal was not attempted today, and tattoo was placed along the opposite wall of the ascending colon.  One 61mm sigmoid sessile polyp was noted and removed with cold forceps.  Evidence of radiation proctitis was noted in the distal rectum.  APC was used to ablate involvement proximal to the dentate line with good result.  radiation proctitis. The time to cecum =    Withdrawal time =      The scope was withdrawn and the procedure completed. COMPLICATIONS: There were no immediate complications.  ENDOSCOPIC IMPRESSION: Severe diverticulosis Multiple colon polyps as above, the largest not removed given its size. Will discuss options with patient for removal. Radiation proctitis, treated with APC with good result  RECOMMENDATIONS: Resume diet Resume medications Resume Xarelto in 5 days NO NSAIDS Await pathology results Recommend consultation to tertiary care center for attempt at endoscopic polypectomy [facility]) or consider surgical consultation.   eSigned:  Yetta Flock, MD 04/25/2015 11:55 AM   cc: the patient   PATIENT NAME:  Marx, Doig MR#: [id number]

## 2015-04-25 NOTE — Interval H&P Note (Signed)
History and Physical Interval Note:  04/25/2015 10:14 AM  [name]  has presented today for surgery, with the diagnosis of colon polyps/barretts  The various methods of treatment have been discussed with the patient and family. After consideration of risks, benefits and other options for treatment, the patient has consented to  Procedure(s): ESOPHAGOGASTRODUODENOSCOPY (EGD) WITH PROPOFOL (N/A) COLONOSCOPY WITH PROPOFOL (N/A) as a surgical intervention .  The patient's history has been reviewed, patient examined, no change in status, stable for surgery.  I have reviewed the patient's chart and labs.  Questions were answered to the patient's satisfaction.     [name]

## 2015-04-25 NOTE — Op Note (Signed)
[facility] [place], [address]   ENDOSCOPY PROCEDURE REPORT  PATIENT: Jeffery, Berry  MR#: [id number] BIRTHDATE: [birth year] , 58  yrs. old GENDER: male ENDOSCOPIST: Yetta Flock, MD REFERRED BY: PROCEDURE DATE:  04/25/2015 PROCEDURE:  EGD w/ biopsy ASA CLASS:     Class III INDICATIONS:  follow up of Barrett's esophagus. MEDICATIONS: Per Anesthesia TOPICAL ANESTHETIC:  DESCRIPTION OF PROCEDURE: After the risks benefits and alternatives of the procedure were thoroughly explained, informed consent was obtained.  The Pentax Gastroscope [id number] endoscope was introduced through the mouth and advanced to the second portion of the duodenum , Without limitations.  The instrument was slowly withdrawn as the mucosa was fully examined.  FINDINGS:The proximal and mid esophagus were normal.  Evidence of Barrett's esophagus was noted at the distal esophagus (C2M3).  No nodularity was appreciated.  Multiple biopsies were obtained.  DH noted at 45cm from the incisors, with GEJ noted 38cm from the incisors, with 7cm hiatal hernia.  SCJ started at 37-38cm from the incisors to extent of 35cm from the incisors.  Multiple small gastric polyps were noted in the gastric body, one removed with cold forceps to confirm benign fundic gland polyps.  The remainder of the stomach was normal.  Normal duodenal bulb and 2nd portion of the duodenum.  Retroflexed views revealed a hiatal hernia.     The scope was then withdrawn from the patient and the procedure completed.  COMPLICATIONS: There were no immediate complications.  ENDOSCOPIC IMPRESSION: Suspected Barrett's esophagus as described above - biopsies obtained Large hiatal hernia Multiple benign appearing gastric  polyps, representative sample obtained    RECOMMENDATIONS: Await pathology results Continue omeprazole Other recommendations per colonoscopy note  eSigned:  Yetta Flock, MD 04/25/2015  12:01 PM    CC:  PATIENT NAME:  [name], [name] MR#: [id number]

## 2015-04-25 NOTE — Anesthesia Preprocedure Evaluation (Addendum)
Anesthesia Evaluation  Patient identified by MRN, date of birth, ID band Patient awake    Reviewed: Allergy & Precautions, H&P , NPO status , Patient's Chart, lab work & pertinent test results  Airway Mallampati: II  TM Distance: >3 FB Neck ROM: full    Dental  (+) Dental Advisory Given, Lower Dentures, Upper Dentures   Pulmonary shortness of breath and with exertion, former smoker, PE   Pulmonary exam normal breath sounds clear to auscultation       Cardiovascular hypertension, Pt. on medications + CAD and + Peripheral Vascular Disease  Normal cardiovascular exam Rhythm:regular Rate:Normal  AAA. Pulmonary htn. Bifascicular block   Neuro/Psych negative neurological ROS  negative psych ROS   GI/Hepatic negative GI ROS, Neg liver ROS, GERD  Medicated and Controlled,  Endo/Other  negative endocrine ROS  Renal/GU negative Renal ROS  negative genitourinary   Musculoskeletal   Abdominal   Peds  Hematology negative hematology ROS (+)   Anesthesia Other Findings   Reproductive/Obstetrics negative OB ROS                            Anesthesia Physical Anesthesia Plan  ASA: III  Anesthesia Plan: MAC   Post-op Pain Management:    Induction:   Airway Management Planned:   Additional Equipment:   Intra-op Plan:   Post-operative Plan:   Informed Consent: I have reviewed the patients History and Physical, chart, labs and discussed the procedure including the risks, benefits and alternatives for the proposed anesthesia with the patient or authorized representative who has indicated his/her understanding and acceptance.   Dental Advisory Given  Plan Discussed with: CRNA and Surgeon  Anesthesia Plan Comments:         Anesthesia Quick Evaluation

## 2015-04-26 ENCOUNTER — Encounter (HOSPITAL_COMMUNITY): Payer: Self-pay | Admitting: Gastroenterology

## 2015-04-27 ENCOUNTER — Telehealth: Payer: Self-pay | Admitting: *Deleted

## 2015-04-27 NOTE — Telephone Encounter (Signed)
He should avoid celebrex and all other NSAIDs for 2 weeks to minimize the risk of bleeding after the procedure he had. thanks

## 2015-04-27 NOTE — Telephone Encounter (Signed)
Patient notified of recommendation. 

## 2015-04-27 NOTE — Telephone Encounter (Signed)
Patient is asking how long he has to stay off Celebrex. Please, advise.

## 2015-05-26 ENCOUNTER — Other Ambulatory Visit: Payer: Self-pay | Admitting: Cardiology

## 2015-06-15 ENCOUNTER — Encounter: Payer: Self-pay | Admitting: Gastroenterology

## 2015-06-15 ENCOUNTER — Ambulatory Visit (INDEPENDENT_AMBULATORY_CARE_PROVIDER_SITE_OTHER): Payer: Medicare Other | Admitting: Gastroenterology

## 2015-06-15 VITALS — BP 130/78 | HR 78 | Ht 73.0 in | Wt 248.6 lb

## 2015-06-15 DIAGNOSIS — K627 Radiation proctitis: Secondary | ICD-10-CM

## 2015-06-15 DIAGNOSIS — D126 Benign neoplasm of colon, unspecified: Secondary | ICD-10-CM | POA: Diagnosis not present

## 2015-06-15 DIAGNOSIS — K2270 Barrett's esophagus without dysplasia: Secondary | ICD-10-CM | POA: Diagnosis not present

## 2015-06-15 NOTE — Patient Instructions (Signed)
Thank you for choosing [facility] to care for you.  

## 2015-06-15 NOTE — Progress Notes (Signed)
HPI :  LAST VISIT: 74 y/o male here for reassessment for colon polyps and recent rectal bleeding, as well as Barrett's esophagus. Followed by Dr. Olevia Perches previously for his screening and Barrett's. He has his colonoscopy in 2014, there were multiple polyps removed, although per report "90%" of polyps were removed however there was a difficult polypectomy in the right colon with some suspected residual adenomatous tissue and incomplete resection. He otherwise had a short segment of nondysplastic Barrett's and suspected GAVE.  He reports he has recently been having some blood in his stools, while on Xarelto. He is on Xarelto for history of PE, and is on lifelong anticoagulation. He has had multiple DVTs in the past as well. He has red blood in the stools which can be sporadic, often about one day per month with blood. He is also on iron pills and makes his stool dark at baseline. He has no pain when he passes blood. He has one BM per day, loose in general, which is his baseline. No abdominal pains.   He also has a history of Barrett's esophagus, nondysplastic, which has been followed over time. He is taking omeprazole 20mg  daily. He denies any reflux sypmtoms at baseline and no breakthrough symptoms on this dosing. No dysphagia. No weight loss. Stable weight.    SINCE LAST VISIT:  EGD showed a 3cm segment of Barrett's. Biopsies did not show any evidence of dysplasia, although the segment appears longer than it did the last time he had this done. He had a large 7cm hiatal hernia, and multiple gastric polyps c/w fundic gland polyps which are benign and likely related to PPI use. He has continued omeprazole 20mg  daily and he tolerates it okay for the most part. He thinks this controls his heartburn, he does not have any complaints in this regard.   On colonoscopy I removed a few polyps, largest > 1cm in size at the suspect site of prior polypectomy, all c/w adenomas. He unfortunately had a large  laterally spreading polyp in the ascending colon which I was not comfortable removing during this exam, roughly 3-4 cm in length with 40% of the lumen diameter involved. He otherwise had evidence of radiation proctitis which I treated with APC and he has had a good result. He denies any further blood in the stools. He otherwise does not have any complaints. He continues to take Xarelto for history of DVTs.   Past Medical History  Diagnosis Date  . Gout   . Barrett's esophagus 2009  . FH: colonic polyps   . Hypertension   . Pulmonary embolus ([place]) 2005; 05/2014    Bilateral, coumadin x 1 years, inpatient for 5 days; hospitalized  . Subdural hematoma ([place])   . Infected sebaceous cyst back 7x5x4cm s/p I&D [id number] 01/11/2013  . History of radiation therapy 06/22/2012-08/18/2012    78 Gy to prostate  . AAA (abdominal aortic aneurysm) ([place])   . CAD (coronary artery disease)     Noted to have coronary calcification on CT  . Shortness of breath   . Pulmonary HTN ([place]) 04/12/2014  . DVT (deep venous thrombosis) ([place]) 05/2014    LLE  . Chronic lower back pain   . Colon polyps     hyperplastic, adenomatous  . Arthritis     "back" (05/26/2014)  . Diverticulosis   . Esophageal stricture   . Duodenitis   . Prostate cancer ([place]) 12/2011    Found on routine screening PSA, planning for implants by Dr.  Kinard 10/16   . GERD (gastroesophageal reflux disease)      Past Surgical History  Procedure Laterality Date  . Craniotomy  04/03/2012    Procedure: CRANIOTOMY HEMATOMA EVACUATION SUBDURAL;  Surgeon: Erline Levine, MD;  Location: [facility];  Service: Neurosurgery;  Laterality: Left;  Left Craniotomy for Evacuation of Subdural Hematoma  . Colonoscopy    . Polypectomy    . Irrigation and debridement sebaceous cyst  01-11-13    "off my back"  . Cardiac catheterization  04/12/2014  . Tonsillectomy  1940's  . Fracture surgery    . Orif hip fracture Left 09/2010    [facility] Endo 10/03/2010  . Rigid  esophagoscopy  04/2008    food impaction/notes 06/13/2008  . Brain surgery      [facility] Endo 04/03/2012  . Left and right heart catheterization with coronary angiogram N/A 04/12/2014    Procedure: LEFT AND RIGHT HEART CATHETERIZATION WITH CORONARY ANGIOGRAM;  Surgeon: Peter M Martinique, MD;  Location: [facility] CATH LAB;  Service: Cardiovascular;  Laterality: N/A;  . Esophagogastroduodenoscopy (egd) with propofol N/A 04/25/2015    Procedure: ESOPHAGOGASTRODUODENOSCOPY (EGD) WITH PROPOFOL;  Surgeon: Manus Gunning, MD;  Location: [facility];  Service: Gastroenterology;  Laterality: N/A;  . Colonoscopy with propofol N/A 04/25/2015    Procedure: COLONOSCOPY WITH PROPOFOL;  Surgeon: Manus Gunning, MD;  Location: [facility];  Service: Gastroenterology;  Laterality: N/A;   Family History  Problem Relation Age of Onset  . Heart disease      No family history  . Macular degeneration Mother   . Osteoarthritis Mother   . Aneurysm Father     d/o 23 yo, ruptured AAA  . Colon cancer Neg Hx   . Colon polyps Neg Hx   . Diabetes Neg Hx    Social History  Substance Use Topics  . Smoking status: Former Smoker -- 1.00 packs/day for 20 years    Types: Cigarettes    Quit date: 12/23/1971  . Smokeless tobacco: Never Used  . Alcohol Use: 8.4 oz/week    14 Cans of beer per week     Comment: 05/26/2014 "couple beers qd"   Current Outpatient Prescriptions  Medication Sig Dispense Refill  . allopurinol (ZYLOPRIM) 300 MG tablet Take 300 mg by mouth daily.    . celecoxib (CELEBREX) 200 MG capsule Take 1 capsule by mouth 2 (two) times daily as needed for moderate pain.     . ferrous sulfate 325 (65 FE) MG tablet Take 325 mg by mouth daily with breakfast.    . lisinopril (PRINIVIL,ZESTRIL) 10 MG tablet TAKE 1 BY MOUTH DAILY 90 tablet 3  . omeprazole (PRILOSEC) 20 MG capsule Take 20 mg by mouth daily.     No current facility-administered medications for this visit.   No Known Allergies   Review of  Systems: All systems reviewed and negative except where noted in HPI.   Lab Results  Component Value Date   WBC 5.1 06/13/2014   HGB 13.3 06/13/2014   HCT 37.8* 06/13/2014   MCV 89.4 06/13/2014   PLT 229 06/13/2014    Lab Results  Component Value Date   CREATININE 0.81 08/01/2014   BUN 20 08/01/2014   NA 139 08/01/2014   K 4.4 08/01/2014   CL 103 08/01/2014   CO2 24 08/01/2014    Lab Results  Component Value Date   ALT 11 05/26/2014   AST 17 05/26/2014   ALKPHOS 75 05/26/2014   BILITOT 0.6 05/26/2014     Physical  Exam: BP 130/78 mmHg  Pulse 78  Ht 6\' 1"  (1.854 m)  Wt 248 lb 9.6 oz (112.764 kg)  BMI 32.81 kg/m2  SpO2 98% Constitutional: Pleasant,well-developed, male in no acute distress, walks with cane HEENT: Normocephalic and atraumatic. Conjunctivae are normal. No scleral icterus. Neck supple.  Cardiovascular: Normal rate, regular rhythm.  Pulmonary/chest: Effort normal and breath sounds normal. No wheezing, rales or rhonchi. Abdominal: Soft, nondistended, nontender. Bowel sounds active throughout. There are no masses palpable.  Extremities: no edema Lymphadenopathy: No cervical adenopathy noted. Neurological: Alert and oriented to person place and time. Skin: Skin is warm and dry. No rashes noted. Psychiatric: Normal mood and affect. Behavior is normal.   ASSESSMENT AND PLAN: 74 y/o male with history of DVT/PE on chronic anticoagulation, here to follow up for history of multiple adenomas, radiation proctitis, and Barrett's.   Adenomatous polyp - multiple polyps removed during his last exam, one at a prior polypectomy site that was lifted and removed. Most concerning was a large laterally spreading lesion in the right colon, which I did not feel comfortable removing at the time of the exam. I did not biopsy it in case he wished to have further attempts at endoscopic removal to allow it to lift easier, and tattooed the wall opposite in case he wished to have  surgery. I discussed options for removal are endoscopic vs. surgical, and discussed both with him. I do think this is amenable to endoscopic resection however it is a high risk polypectomy for bleeding, especially with his need for anticoagulants. In discussing this with him, given the size of this polyp recommend referral to a tertiary care center for polypectomy. Will refer to Dr. Arsenio Loader at [facility] for an evaluation.   Radiation proctitis - treated with APC. I suspect this was causing his rectal bleeding as it has since resolved. He can follow up as needed for this, repeat colonoscopy will assess if further treatment is needed.   Barrett's - 3cm segment, non dysplastic. Recommend he continue omeprazole at presently dosed, working well to control his GERD. Repeat EGD in 3 years for surveillance purposes unless he has dysphagia or new symptoms in the interim.   [place] Cellar, MD [facility] Pager [phone]  CC: Deland Pretty, MD

## 2015-06-16 ENCOUNTER — Telehealth: Payer: Self-pay | Admitting: *Deleted

## 2015-06-16 ENCOUNTER — Telehealth: Payer: Self-pay

## 2015-06-16 NOTE — Telephone Encounter (Signed)
This has been completed by Damian Leavell, [place].

## 2015-06-16 NOTE — Telephone Encounter (Signed)
Pt is scheduled to see Dr.Gilliam 07/19/2015 at 3:00pm. Called pt and informed him of this appointment. Pt understands.

## 2015-06-16 NOTE — Telephone Encounter (Signed)
-----   Message from Manus Gunning, MD sent at 06/15/2015  6:03 PM EST ----- Spoke with Dr. Hilarie Fredrickson about this patient in regards to where I can refer this patient. He recommended Dr. Arsenio Loader at [facility]. Can you please set up a referral for him with this provider, and include my clinic note and copy of his last colonoscopy so they can review it? Thanks!  Dr. Loni Muse

## 2015-07-19 ENCOUNTER — Ambulatory Visit (HOSPITAL_COMMUNITY)
Admission: RE | Admit: 2015-07-19 | Discharge: 2015-07-19 | Disposition: A | Discharge status: Home / Self Care | Payer: PPO | Source: Ambulatory Visit | Attending: Cardiology | Admitting: Cardiology

## 2015-07-19 DIAGNOSIS — I714 Abdominal aortic aneurysm, without rupture, unspecified: Secondary | ICD-10-CM

## 2015-07-19 DIAGNOSIS — I700 Atherosclerosis of aorta: Secondary | ICD-10-CM | POA: Diagnosis not present

## 2015-07-19 DIAGNOSIS — Z87891 Personal history of nicotine dependence: Secondary | ICD-10-CM | POA: Diagnosis not present

## 2015-07-19 DIAGNOSIS — I10 Essential (primary) hypertension: Secondary | ICD-10-CM | POA: Insufficient documentation

## 2015-07-19 DIAGNOSIS — I2510 Atherosclerotic heart disease of native coronary artery without angina pectoris: Secondary | ICD-10-CM | POA: Insufficient documentation

## 2015-07-21 ENCOUNTER — Telehealth: Payer: Self-pay | Admitting: Cardiology

## 2015-07-21 NOTE — Telephone Encounter (Signed)
Calling to get test results , please call    Thanks

## 2015-07-21 NOTE — Telephone Encounter (Signed)
Pt aware of results and recommendations. Result notes updated.

## 2015-07-26 DIAGNOSIS — M109 Gout, unspecified: Secondary | ICD-10-CM | POA: Diagnosis not present

## 2015-07-26 DIAGNOSIS — K635 Polyp of colon: Secondary | ICD-10-CM | POA: Diagnosis not present

## 2015-07-26 DIAGNOSIS — I2510 Atherosclerotic heart disease of native coronary artery without angina pectoris: Secondary | ICD-10-CM | POA: Diagnosis not present

## 2015-07-26 DIAGNOSIS — Z86711 Personal history of pulmonary embolism: Secondary | ICD-10-CM | POA: Diagnosis not present

## 2015-07-26 DIAGNOSIS — Z7901 Long term (current) use of anticoagulants: Secondary | ICD-10-CM | POA: Diagnosis not present

## 2015-07-26 DIAGNOSIS — Z8546 Personal history of malignant neoplasm of prostate: Secondary | ICD-10-CM | POA: Diagnosis not present

## 2015-07-26 DIAGNOSIS — I272 Other secondary pulmonary hypertension: Secondary | ICD-10-CM | POA: Diagnosis not present

## 2015-07-26 DIAGNOSIS — Z79899 Other long term (current) drug therapy: Secondary | ICD-10-CM | POA: Diagnosis not present

## 2015-07-26 DIAGNOSIS — I10 Essential (primary) hypertension: Secondary | ICD-10-CM | POA: Diagnosis not present

## 2015-08-16 ENCOUNTER — Telehealth: Payer: Self-pay | Admitting: Cardiology

## 2015-08-16 NOTE — Telephone Encounter (Signed)
Spoke with pt wife, Aware of dr crenshaw's recommendations.  

## 2015-08-16 NOTE — Telephone Encounter (Signed)
[place] for plan as outlined Kirk Ruths

## 2015-08-16 NOTE — Telephone Encounter (Signed)
Spoke with pt wife, the pt is going to have some polyps removed and they were told to hold the xarelto for 2 days prior to the procedure and to take aspirin 81 mg daily as long as he is off the xarelto. They want to make sure dr Stanford Breed agrees with this recommendation. ,Will forward for dr Stanford Breed review

## 2015-08-16 NOTE — Telephone Encounter (Signed)
Please call her before 10:30 this morning if possible. Pt is going to have a Colonoscoy next week,need to know about his medicine.

## 2015-08-18 DIAGNOSIS — Z86711 Personal history of pulmonary embolism: Secondary | ICD-10-CM | POA: Diagnosis not present

## 2015-08-18 DIAGNOSIS — I272 Other secondary pulmonary hypertension: Secondary | ICD-10-CM | POA: Diagnosis not present

## 2015-08-18 DIAGNOSIS — I714 Abdominal aortic aneurysm, without rupture: Secondary | ICD-10-CM | POA: Diagnosis not present

## 2015-08-18 DIAGNOSIS — K219 Gastro-esophageal reflux disease without esophagitis: Secondary | ICD-10-CM | POA: Diagnosis not present

## 2015-08-18 DIAGNOSIS — Z859 Personal history of malignant neoplasm, unspecified: Secondary | ICD-10-CM | POA: Diagnosis not present

## 2015-08-18 DIAGNOSIS — Z6834 Body mass index (BMI) 34.0-34.9, adult: Secondary | ICD-10-CM | POA: Diagnosis not present

## 2015-08-18 DIAGNOSIS — I10 Essential (primary) hypertension: Secondary | ICD-10-CM | POA: Diagnosis not present

## 2015-08-18 DIAGNOSIS — Z7901 Long term (current) use of anticoagulants: Secondary | ICD-10-CM | POA: Diagnosis not present

## 2015-08-18 DIAGNOSIS — D122 Benign neoplasm of ascending colon: Secondary | ICD-10-CM | POA: Diagnosis not present

## 2015-08-18 DIAGNOSIS — Z923 Personal history of irradiation: Secondary | ICD-10-CM | POA: Diagnosis not present

## 2015-08-18 DIAGNOSIS — M5136 Other intervertebral disc degeneration, lumbar region: Secondary | ICD-10-CM | POA: Diagnosis not present

## 2015-08-18 DIAGNOSIS — Z87891 Personal history of nicotine dependence: Secondary | ICD-10-CM | POA: Diagnosis not present

## 2015-08-18 DIAGNOSIS — M109 Gout, unspecified: Secondary | ICD-10-CM | POA: Diagnosis not present

## 2015-08-18 DIAGNOSIS — I2510 Atherosclerotic heart disease of native coronary artery without angina pectoris: Secondary | ICD-10-CM | POA: Diagnosis not present

## 2015-08-25 DIAGNOSIS — K635 Polyp of colon: Secondary | ICD-10-CM | POA: Diagnosis not present

## 2015-08-25 DIAGNOSIS — D122 Benign neoplasm of ascending colon: Secondary | ICD-10-CM | POA: Diagnosis not present

## 2015-11-03 ENCOUNTER — Telehealth: Payer: Self-pay | Admitting: *Deleted

## 2015-11-03 ENCOUNTER — Other Ambulatory Visit: Payer: Self-pay | Admitting: *Deleted

## 2015-11-03 NOTE — Telephone Encounter (Signed)
Recommendation needed. Pt on Xarelto "for life" due to recurrent PE. Had been prescribed Celebrex by other physician and was warned by pharmacy about interaction between these medications.  Informed them to avoid the celebrex, I would seek recommendation for alternative - likely OTCs.

## 2015-11-06 NOTE — Telephone Encounter (Signed)
Spoke to patient's wife about medication. She states he has actually been taking the medication for several months now but she just got a chance to read up on the medication. He has been taking only one per day instead of the prescribed twice daily. She said he is unable to do his daily activities if he does not use the medication. He has tried tylenol, hydrocodone, tramadol; all with no relief. We discussed the risk/benefit and having him minimize the amount he takes. She stated she understood.

## 2015-12-15 DIAGNOSIS — Z13220 Encounter for screening for lipoid disorders: Secondary | ICD-10-CM | POA: Diagnosis not present

## 2015-12-15 DIAGNOSIS — Z125 Encounter for screening for malignant neoplasm of prostate: Secondary | ICD-10-CM | POA: Diagnosis not present

## 2015-12-15 DIAGNOSIS — I10 Essential (primary) hypertension: Secondary | ICD-10-CM | POA: Diagnosis not present

## 2015-12-15 DIAGNOSIS — Z0000 Encounter for general adult medical examination without abnormal findings: Secondary | ICD-10-CM | POA: Diagnosis not present

## 2015-12-19 DIAGNOSIS — R0609 Other forms of dyspnea: Secondary | ICD-10-CM | POA: Diagnosis not present

## 2015-12-19 DIAGNOSIS — R600 Localized edema: Secondary | ICD-10-CM | POA: Diagnosis not present

## 2015-12-19 DIAGNOSIS — I10 Essential (primary) hypertension: Secondary | ICD-10-CM | POA: Diagnosis not present

## 2015-12-19 DIAGNOSIS — Z0000 Encounter for general adult medical examination without abnormal findings: Secondary | ICD-10-CM | POA: Diagnosis not present

## 2016-01-10 DIAGNOSIS — Z7901 Long term (current) use of anticoagulants: Secondary | ICD-10-CM | POA: Diagnosis not present

## 2016-01-10 DIAGNOSIS — Z86010 Personal history of colonic polyps: Secondary | ICD-10-CM | POA: Diagnosis not present

## 2016-01-10 DIAGNOSIS — Z79899 Other long term (current) drug therapy: Secondary | ICD-10-CM | POA: Diagnosis not present

## 2016-02-02 DIAGNOSIS — Z8546 Personal history of malignant neoplasm of prostate: Secondary | ICD-10-CM | POA: Diagnosis not present

## 2016-02-07 DIAGNOSIS — Z8546 Personal history of malignant neoplasm of prostate: Secondary | ICD-10-CM | POA: Diagnosis not present

## 2016-02-07 DIAGNOSIS — N5201 Erectile dysfunction due to arterial insufficiency: Secondary | ICD-10-CM | POA: Diagnosis not present

## 2016-02-07 DIAGNOSIS — R3912 Poor urinary stream: Secondary | ICD-10-CM | POA: Diagnosis not present

## 2016-02-20 DIAGNOSIS — M47817 Spondylosis without myelopathy or radiculopathy, lumbosacral region: Secondary | ICD-10-CM | POA: Diagnosis not present

## 2016-02-23 DIAGNOSIS — K5730 Diverticulosis of large intestine without perforation or abscess without bleeding: Secondary | ICD-10-CM | POA: Diagnosis not present

## 2016-02-23 DIAGNOSIS — K2270 Barrett's esophagus without dysplasia: Secondary | ICD-10-CM | POA: Diagnosis not present

## 2016-02-23 DIAGNOSIS — K5790 Diverticulosis of intestine, part unspecified, without perforation or abscess without bleeding: Secondary | ICD-10-CM | POA: Diagnosis not present

## 2016-02-23 DIAGNOSIS — Z923 Personal history of irradiation: Secondary | ICD-10-CM | POA: Diagnosis not present

## 2016-02-23 DIAGNOSIS — Z6832 Body mass index (BMI) 32.0-32.9, adult: Secondary | ICD-10-CM | POA: Diagnosis not present

## 2016-02-23 DIAGNOSIS — Z8679 Personal history of other diseases of the circulatory system: Secondary | ICD-10-CM | POA: Diagnosis not present

## 2016-02-23 DIAGNOSIS — Z87891 Personal history of nicotine dependence: Secondary | ICD-10-CM | POA: Diagnosis not present

## 2016-02-23 DIAGNOSIS — Z8546 Personal history of malignant neoplasm of prostate: Secondary | ICD-10-CM | POA: Diagnosis not present

## 2016-02-23 DIAGNOSIS — E669 Obesity, unspecified: Secondary | ICD-10-CM | POA: Diagnosis not present

## 2016-02-23 DIAGNOSIS — K219 Gastro-esophageal reflux disease without esophagitis: Secondary | ICD-10-CM | POA: Diagnosis not present

## 2016-02-23 DIAGNOSIS — Z86711 Personal history of pulmonary embolism: Secondary | ICD-10-CM | POA: Diagnosis not present

## 2016-02-23 DIAGNOSIS — Z79899 Other long term (current) drug therapy: Secondary | ICD-10-CM | POA: Diagnosis not present

## 2016-02-23 DIAGNOSIS — Z86010 Personal history of colonic polyps: Secondary | ICD-10-CM | POA: Diagnosis not present

## 2016-02-23 DIAGNOSIS — M47816 Spondylosis without myelopathy or radiculopathy, lumbar region: Secondary | ICD-10-CM | POA: Diagnosis not present

## 2016-02-23 DIAGNOSIS — Z09 Encounter for follow-up examination after completed treatment for conditions other than malignant neoplasm: Secondary | ICD-10-CM | POA: Diagnosis not present

## 2016-02-23 DIAGNOSIS — I10 Essential (primary) hypertension: Secondary | ICD-10-CM | POA: Diagnosis not present

## 2016-02-23 DIAGNOSIS — Z86718 Personal history of other venous thrombosis and embolism: Secondary | ICD-10-CM | POA: Diagnosis not present

## 2016-02-23 DIAGNOSIS — I2510 Atherosclerotic heart disease of native coronary artery without angina pectoris: Secondary | ICD-10-CM | POA: Diagnosis not present

## 2016-02-23 DIAGNOSIS — D509 Iron deficiency anemia, unspecified: Secondary | ICD-10-CM | POA: Diagnosis not present

## 2016-02-23 DIAGNOSIS — I272 Other secondary pulmonary hypertension: Secondary | ICD-10-CM | POA: Diagnosis not present

## 2016-02-23 DIAGNOSIS — Z7901 Long term (current) use of anticoagulants: Secondary | ICD-10-CM | POA: Diagnosis not present

## 2016-03-13 DIAGNOSIS — M47817 Spondylosis without myelopathy or radiculopathy, lumbosacral region: Secondary | ICD-10-CM | POA: Diagnosis not present

## 2016-04-03 ENCOUNTER — Encounter: Payer: Self-pay | Admitting: Cardiology

## 2016-04-07 NOTE — Progress Notes (Signed)
HPI: FU CAD; I initially saw in Oct 2013 for evaluation of dyspnea. Note he also has a history of unexplained pulmonary embolus years ago treated with one year of Coumadin. Patient was admitted in October of 2013 and found to have a subdural hematoma which required evacuation. Seen for dyspnea 10/15. Cardiac catheterization October 2015 showed an 80% second diagonal but otherwise nonobstructive coronary disease. Ejection fraction 55-65%. Chest CT October 2015 showed bilateral pulmonary emboli. Echocardiogram October 2015 showed normal LV function. The right ventricle was dilated as was the right atrium. PA pressure elevated. Abdominal ultrasound in Jan 2017 showed a 3.0 x 3.3 cm abdominal aortic aneurysm. Bilateral iliac aneurysms (2.1 x 2.6 R and 2 x 1.9 L). Followup recommended in one year. Since I last saw him, he has some dyspnea on exertion but no orthopnea, PND, pedal edema, chest pain or syncope.   Current Outpatient Prescriptions  Medication Sig Dispense Refill  . allopurinol (ZYLOPRIM) 300 MG tablet Take 300 mg by mouth daily.    . celecoxib (CELEBREX) 200 MG capsule Take 1 capsule by mouth 2 (two) times daily as needed for moderate pain.     . DULoxetine (CYMBALTA) 30 MG capsule Take 30 mg by mouth daily.    Marland Kitchen lisinopril (PRINIVIL,ZESTRIL) 10 MG tablet TAKE 1 BY MOUTH DAILY 90 tablet 3  . omeprazole (PRILOSEC) 20 MG capsule Take 20 mg by mouth daily.    Alveda Reasons 20 MG TABS tablet Take 20 mg by mouth daily with supper.  2   No current facility-administered medications for this visit.      Past Medical History:  Diagnosis Date  . AAA (abdominal aortic aneurysm) ([place])   . Arthritis    "back" (05/26/2014)  . Barrett's esophagus 2009  . CAD (coronary artery disease)    Noted to have coronary calcification on CT  . Chronic lower back pain   . Colon polyps    hyperplastic, adenomatous  . Diverticulosis   . Duodenitis   . DVT (deep venous thrombosis) ([place]) 05/2014   LLE  .  Esophageal stricture   . FH: colonic polyps   . GERD (gastroesophageal reflux disease)   . Gout   . History of radiation therapy 06/22/2012-08/18/2012   78 Gy to prostate  . Hypertension   . Infected sebaceous cyst back 7x5x4cm s/p I&D [id number] 01/11/2013  . Prostate cancer ([place]) 12/2011   Found on routine screening PSA, planning for implants by Dr. Sondra Come 10/16   . Pulmonary embolus ([place]) 2005; 05/2014   Bilateral, coumadin x 1 years, inpatient for 5 days; hospitalized  . Pulmonary HTN 04/12/2014  . Shortness of breath   . Subdural hematoma [facility])     Past Surgical History:  Procedure Laterality Date  . BRAIN SURGERY     Ar[facility] Endo 04/03/2012  . CARDIAC CATHETERIZATION  04/12/2014  . COLONOSCOPY    . COLONOSCOPY WITH PROPOFOL N/A 04/25/2015   Procedure: COLONOSCOPY WITH PROPOFOL;  Surgeon: Manus Gunning, MD;  Location: [facility];  Service: Gastroenterology;  Laterality: N/A;  . CRANIOTOMY  04/03/2012   Procedure: CRANIOTOMY HEMATOMA EVACUATION SUBDURAL;  Surgeon: Erline Levine, MD;  Location: [facility];  Service: Neurosurgery;  Laterality: Left;  Left Craniotomy for Evacuation of Subdural Hematoma  . ESOPHAGOGASTRODUODENOSCOPY (EGD) WITH PROPOFOL N/A 04/25/2015   Procedure: ESOPHAGOGASTRODUODENOSCOPY (EGD) WITH PROPOFOL;  Surgeon: Manus Gunning, MD;  Location: [facility];  Service: Gastroenterology;  Laterality: N/A;  . FRACTURE SURGERY    . IRRIGATION  AND DEBRIDEMENT SEBACEOUS CYST  01-11-13   "off my back"  . LEFT AND RIGHT HEART CATHETERIZATION WITH CORONARY ANGIOGRAM N/A 04/12/2014   Procedure: LEFT AND RIGHT HEART CATHETERIZATION WITH CORONARY ANGIOGRAM;  Surgeon: Peter M Martinique, MD;  Location: [facility] CATH LAB;  Service: Cardiovascular;  Laterality: N/A;  . ORIF HIP FRACTURE Left 09/2010   Archie Endo 10/03/2010  . POLYPECTOMY    . RIGID ESOPHAGOSCOPY  04/2008   food impaction/notes 06/13/2008  . TONSILLECTOMY  1940's    Social History   Social History  . Marital  status: Married    Spouse name: N/A  . Number of children: 3  . Years of education: N/A   Occupational History  . Retired Retired    Dance movement psychotherapist   Social History Main Topics  . Smoking status: Former Smoker    Packs/day: 1.00    Years: 20.00    Types: Cigarettes    Quit date: 12/23/1971  . Smokeless tobacco: Never Used  . Alcohol use 8.4 oz/week    14 Cans of beer per week     Comment: 05/26/2014 "couple beers qd"  . Drug use: No  . Sexual activity: Yes   Other Topics Concern  . Not on file   Social History Narrative   Originally from [place].  Retired to [employer] because he was tired of all the snow.    Family History  Problem Relation Age of Onset  . Macular degeneration Mother   . Osteoarthritis Mother   . Aneurysm Father     d/o 49 yo, ruptured AAA  . Heart disease      No family history  . Colon cancer Neg Hx   . Colon polyps Neg Hx   . Diabetes Neg Hx     [id number] pain but no fevers or chills, productive cough, hemoptysis, dysphasia, odynophagia, melena, hematochezia, dysuria, hematuria, rash, seizure activity, orthopnea, PND, pedal edema, claudication. Remaining systems are negative.  Physical Exam: Well-developed well-nourished in no acute distress.  Skin is warm and dry.  HEENT is normal.  Neck is supple.  Chest is clear to auscultation with normal expansion.  Cardiovascular exam is regular rate and rhythm.  Abdominal exam nontender or distended. No masses palpated. Extremities show no edema. neuro grossly intact  ECG-Sinus rhythm with occasional PVC, LAFB, RBBB  A/P  1 hypertension-blood pressure controlled. Continue present medications.  2 Abdominal aortic aneurysm-follow-up ultrasound January 2018.   3 coronary artery disease-no aspirin given need for anticoagulation. Declines statins.   4 history of recurrent pulmonary emboli-patient will need lifelong anticoagulation. Obtain most recent blood work from his primary care  physician.     Kirk Ruths, MD

## 2016-04-15 ENCOUNTER — Ambulatory Visit (INDEPENDENT_AMBULATORY_CARE_PROVIDER_SITE_OTHER): Payer: PPO | Admitting: Cardiology

## 2016-04-15 ENCOUNTER — Encounter: Payer: Self-pay | Admitting: Cardiology

## 2016-04-15 VITALS — BP 138/82 | HR 97 | Ht 72.0 in | Wt 242.6 lb

## 2016-04-15 DIAGNOSIS — I2510 Atherosclerotic heart disease of native coronary artery without angina pectoris: Secondary | ICD-10-CM | POA: Diagnosis not present

## 2016-04-15 DIAGNOSIS — I714 Abdominal aortic aneurysm, without rupture, unspecified: Secondary | ICD-10-CM

## 2016-04-15 DIAGNOSIS — I10 Essential (primary) hypertension: Secondary | ICD-10-CM

## 2016-04-15 DIAGNOSIS — Z23 Encounter for immunization: Secondary | ICD-10-CM | POA: Diagnosis not present

## 2016-04-15 NOTE — Patient Instructions (Signed)
Your physician wants you to follow-up in: ONE YEAR WITH DR CRENSHAW You will receive a reminder letter in the mail two months in advance. If you don't receive a letter, please call our office to schedule the follow-up appointment.   If you need a refill on your cardiac medications before your next appointment, please call your pharmacy.  

## 2016-04-23 DIAGNOSIS — M47817 Spondylosis without myelopathy or radiculopathy, lumbosacral region: Secondary | ICD-10-CM | POA: Diagnosis not present

## 2016-05-23 ENCOUNTER — Other Ambulatory Visit: Payer: Self-pay | Admitting: Cardiology

## 2016-06-24 HISTORY — PX: CATARACT EXTRACTION W/ INTRAOCULAR LENS  IMPLANT, BILATERAL: SHX1307

## 2016-07-01 ENCOUNTER — Other Ambulatory Visit: Payer: Self-pay | Admitting: Cardiology

## 2016-07-01 NOTE — Telephone Encounter (Signed)
Rx(s) sent to pharmacy electronically.  

## 2016-07-16 ENCOUNTER — Other Ambulatory Visit: Payer: Self-pay | Admitting: Cardiology

## 2016-07-16 DIAGNOSIS — I714 Abdominal aortic aneurysm, without rupture, unspecified: Secondary | ICD-10-CM

## 2016-07-24 ENCOUNTER — Ambulatory Visit (HOSPITAL_COMMUNITY)
Admission: RE | Admit: 2016-07-24 | Discharge: 2016-07-24 | Disposition: A | Discharge status: Home / Self Care | Payer: PPO | Source: Ambulatory Visit | Attending: Cardiovascular Disease | Admitting: Cardiovascular Disease

## 2016-07-24 DIAGNOSIS — M47817 Spondylosis without myelopathy or radiculopathy, lumbosacral region: Secondary | ICD-10-CM | POA: Diagnosis not present

## 2016-07-24 DIAGNOSIS — I714 Abdominal aortic aneurysm, without rupture, unspecified: Secondary | ICD-10-CM

## 2016-07-31 DIAGNOSIS — Z8546 Personal history of malignant neoplasm of prostate: Secondary | ICD-10-CM | POA: Diagnosis not present

## 2016-08-07 DIAGNOSIS — N5201 Erectile dysfunction due to arterial insufficiency: Secondary | ICD-10-CM | POA: Diagnosis not present

## 2016-08-07 DIAGNOSIS — Z8546 Personal history of malignant neoplasm of prostate: Secondary | ICD-10-CM | POA: Diagnosis not present

## 2016-08-07 DIAGNOSIS — R197 Diarrhea, unspecified: Secondary | ICD-10-CM | POA: Diagnosis not present

## 2016-08-08 ENCOUNTER — Ambulatory Visit: Payer: PPO | Admitting: Nurse Practitioner

## 2016-08-08 DIAGNOSIS — R197 Diarrhea, unspecified: Secondary | ICD-10-CM | POA: Diagnosis not present

## 2016-08-15 DIAGNOSIS — H52203 Unspecified astigmatism, bilateral: Secondary | ICD-10-CM | POA: Diagnosis not present

## 2016-08-15 DIAGNOSIS — H2513 Age-related nuclear cataract, bilateral: Secondary | ICD-10-CM | POA: Diagnosis not present

## 2016-08-15 DIAGNOSIS — H524 Presbyopia: Secondary | ICD-10-CM | POA: Diagnosis not present

## 2016-08-15 DIAGNOSIS — H5213 Myopia, bilateral: Secondary | ICD-10-CM | POA: Diagnosis not present

## 2016-08-23 DIAGNOSIS — R197 Diarrhea, unspecified: Secondary | ICD-10-CM | POA: Diagnosis not present

## 2016-08-28 DIAGNOSIS — H2513 Age-related nuclear cataract, bilateral: Secondary | ICD-10-CM | POA: Diagnosis not present

## 2016-09-02 ENCOUNTER — Telehealth: Payer: Self-pay | Admitting: Cardiology

## 2016-09-02 NOTE — Telephone Encounter (Signed)
New message       Pt request to talk to the nurse.  He would not tell me what he wanted except he had an important question to ask

## 2016-09-02 NOTE — Telephone Encounter (Signed)
Spoke with pt wife, the patient is going to have cataract surgery and the folks doing the procedure told the patient to call us regarding the xarelto. They told the patient they did not think he needed to stop xarelto for the procedure but to confirm with [place]. Aware we would continue the xarelto if the person doing the procedure does not need it stopped. The patient wants confirmation of the above from dr Stanford Breed. Will forward for dr Stanford Breed review

## 2016-09-02 NOTE — Telephone Encounter (Signed)
Does not need to DC if procedure can be done while on [employer]

## 2016-09-02 NOTE — Telephone Encounter (Signed)
Spoke with pt wife, Aware of dr crenshaw's recommendations.  

## 2016-09-04 DIAGNOSIS — H2512 Age-related nuclear cataract, left eye: Secondary | ICD-10-CM | POA: Diagnosis not present

## 2016-09-04 DIAGNOSIS — H2511 Age-related nuclear cataract, right eye: Secondary | ICD-10-CM | POA: Diagnosis not present

## 2016-09-05 DIAGNOSIS — A0472 Enterocolitis due to Clostridium difficile, not specified as recurrent: Secondary | ICD-10-CM | POA: Diagnosis not present

## 2016-09-11 DIAGNOSIS — H2512 Age-related nuclear cataract, left eye: Secondary | ICD-10-CM | POA: Diagnosis not present

## 2016-10-01 ENCOUNTER — Encounter (INDEPENDENT_AMBULATORY_CARE_PROVIDER_SITE_OTHER): Payer: Self-pay

## 2016-10-01 ENCOUNTER — Encounter: Payer: Self-pay | Admitting: Physician Assistant

## 2016-10-01 ENCOUNTER — Ambulatory Visit (INDEPENDENT_AMBULATORY_CARE_PROVIDER_SITE_OTHER): Payer: PPO | Admitting: Physician Assistant

## 2016-10-01 ENCOUNTER — Other Ambulatory Visit (INDEPENDENT_AMBULATORY_CARE_PROVIDER_SITE_OTHER): Payer: PPO

## 2016-10-01 VITALS — BP 120/74 | HR 93 | Ht 72.0 in | Wt 238.0 lb

## 2016-10-01 DIAGNOSIS — A0472 Enterocolitis due to Clostridium difficile, not specified as recurrent: Secondary | ICD-10-CM

## 2016-10-01 DIAGNOSIS — R197 Diarrhea, unspecified: Secondary | ICD-10-CM

## 2016-10-01 LAB — CBC WITH DIFFERENTIAL/PLATELET
Basophils Absolute: 0.1 10*3/uL (ref 0.0–0.1)
Basophils Relative: 1.1 % (ref 0.0–3.0)
EOS ABS: 0.2 10*3/uL (ref 0.0–0.7)
Eosinophils Relative: 3.8 % (ref 0.0–5.0)
HCT: 39.2 % (ref 39.0–52.0)
HEMOGLOBIN: 13.4 g/dL (ref 13.0–17.0)
Lymphocytes Relative: 18.5 % (ref 12.0–46.0)
Lymphs Abs: 0.9 10*3/uL (ref 0.7–4.0)
MCHC: 34 g/dL (ref 30.0–36.0)
MCV: 97.4 fl (ref 78.0–100.0)
MONO ABS: 0.6 10*3/uL (ref 0.1–1.0)
Monocytes Relative: 12.4 % — ABNORMAL HIGH (ref 3.0–12.0)
Neutro Abs: 3 10*3/uL (ref 1.4–7.7)
Neutrophils Relative %: 64.2 % (ref 43.0–77.0)
Platelets: 228 10*3/uL (ref 150.0–400.0)
RBC: 4.03 Mil/uL — AB (ref 4.22–5.81)
RDW: 13.4 % (ref 11.5–15.5)
WBC: 4.7 10*3/uL (ref 4.0–10.5)

## 2016-10-01 LAB — BASIC METABOLIC PANEL
BUN: 27 mg/dL — ABNORMAL HIGH (ref 6–23)
CHLORIDE: 107 meq/L (ref 96–112)
CO2: 23 mEq/L (ref 19–32)
Calcium: 8.9 mg/dL (ref 8.4–10.5)
Creatinine, Ser: 1.14 mg/dL (ref 0.40–1.50)
GFR: 66.19 mL/min (ref >60.00)
GLUCOSE: 102 mg/dL — AB (ref 70–99)
POTASSIUM: 4.2 meq/L (ref 3.5–5.1)
SODIUM: 139 meq/L (ref 135–145)

## 2016-10-01 MED ORDER — VANCOMYCIN HCL 250 MG PO CAPS: 250.0000 mg | ORAL_CAPSULE | ORAL | 0 refills | Status: DC

## 2016-10-01 MED ORDER — DICYCLOMINE HCL 10 MG PO CAPS: 10.0000 mg | ORAL_CAPSULE | Freq: Three times a day (TID) | ORAL | 1 refills | Status: DC

## 2016-10-01 NOTE — Patient Instructions (Signed)
Your physician has requested that you go to the basement for lab work before leaving today.  We have sent the following medications to your pharmacy for you to pick up at your convenience:  Vancomycin 250 mg four times a day for 14 days then 250 mg three times a day for 7 days, then 250 mg twice a day for 7 day, then 250 mg for 7 days.   Bentyl 10 mg 30 mins before meals.   Please purchase the following medications over the counter and take as directed: Florastor one tablet twice a day for 2 months.

## 2016-10-01 NOTE — Progress Notes (Signed)
Subjective:    Patient ID: [name], male    DOB: 06/14/1941, 76 y.o.   MRN: [id number]  HPI [name] is a 76 year old white male, known to Dr. Havery Moros is referred today by Dr. Shelia Media  for evaluation of diarrhea. Patient has history of coronary artery disease, hypertension, abdominal aortic aneurysm, prior history of DVT and PE's maintained on Xarelto. He also has history of Barrett's esophagus, and adenocarcinoma of the colon. He had undergone EGD and colonoscopy in November 2016 per Dr. Havery Moros. He had Barrett's without dysplasia and possible GAVE. At colonoscopy had severe left colon diverticulosis and several colon polyps. He had a large 4 cm spreading polyp in the ascending colon which was tattooed but not removed. And also had APC done to an area of radiation proctitis. He was subsequently referred to Dr. Arsenio Loader at [facility] for endoscopic mucosal resection of this lesion. This was done in March 2017 and repeated again in September. I cannot see the results of the procedure from September in care everywhere. Patient comes in today with a new problem of diarrhea which she says is been present over the past 4 months. Notes from Dr. Pennie Banter office showed that he was diagnosed with C. difficile colitis in February 2018. He had a GI pathogen panel done by PCR with C. difficile positive. He was treated with a two-week course of vancomycin and then repeat stool for C. difficile by toxin assay was negative. Patient is not the best historian but feels that he's had ongoing problems with diarrhea and does not recall really improving much with the vancomycin, but he was not sure what he had taken. At this time he is having no complaints of abdominal pain or cramping but continues to have at least 6-7 watery bowel movements per day which she says are very malodorous and urgent. He is having frequent episodes of nocturnal diarrhea as well. He has had some incontinence. Appetite has been okay no  nausea or vomiting. He feels somewhat rundown and weak.  Review of Systems Pertinent positive and negative review of systems were noted in the above HPI section.  All other review of systems was otherwise negative.  Outpatient Encounter Prescriptions as of 10/01/2016  Medication Sig  . allopurinol (ZYLOPRIM) 300 MG tablet Take 300 mg by mouth daily.  . celecoxib (CELEBREX) 200 MG capsule Take 1 capsule by mouth 2 (two) times daily as needed for moderate pain.   . DULoxetine (CYMBALTA) 30 MG capsule Take 30 mg by mouth daily.  . Lactobacillus (PROBIOTIC ACIDOPHILUS PO) Take 1 tablet by mouth daily.  Marland Kitchen lisinopril (PRINIVIL,ZESTRIL) 10 MG tablet TAKE 1 TABLET BY MOUTH EVERY DAY  . omeprazole (PRILOSEC) 20 MG capsule Take 20 mg by mouth daily.  . ranitidine (ZANTAC) 150 MG tablet Take 150 mg by mouth 2 (two) times daily.  Alveda Reasons 20 MG TABS tablet TAKE 1 TABLET BY MOUTH EVERY DAY WITH SUPPER  . dicyclomine (BENTYL) 10 MG capsule Take 1 capsule (10 mg total) by mouth 3 (three) times daily before meals.  . vancomycin (VANCOCIN) 250 MG capsule Take 1 capsule (250 mg total) by mouth as directed.   No facility-administered encounter medications on file as of 10/01/2016.    No Known Allergies Patient Active Problem List   Diagnosis Date Noted  . History of colonic polyps   . Diarrhea 07/01/2014  . Rectal bleeding 06/28/2014  . Positive blood cultures 05/26/2014  . Bacteremia due to Gram-positive bacteria 05/26/2014  . Leukopenia 05/26/2014  .  Neutropenia ([place]) 05/26/2014  . DVT, lower extremity, distal, acute ([place]) 04/19/2014  . Bilateral pulmonary embolism ([place]) 04/14/2014  . Acute pulmonary embolism ([place]) 04/14/2014  . Pulmonary HTN 04/12/2014  . Infected sebaceous cyst back 7x5x4cm s/p I&D [id number] 01/11/2013  . Abdominal aortic aneurysm ([place]) 05/25/2012  . CAD (coronary artery disease) 05/25/2012  . Dyspnea 03/30/2012  . Hypertension 03/30/2012  . Bruit 03/30/2012  . Prostate cancer  ([place]) 02/03/2012  . GERD 11/26/2007  . ADENOCARCINOMA, COLON 11/25/2007  . Gout 11/25/2007  . ESOPHAGEAL STRICTURE 11/25/2007  . BARRETTS ESOPHAGUS 11/25/2007  . DUODENITIS, WITHOUT HEMORRHAGE 11/25/2007  . DIVERTICULOSIS OF COLON 11/25/2007  . ABDOMINAL PAIN, EPIGASTRIC 11/25/2007  . FOREIGN BODY IN ESOPHAGUS 11/25/2007  . COLONIC POLYPS, ADENOMATOUS, HX OF 11/25/2007   Social History   Social History  . Marital status: Married    Spouse name: N/A  . Number of children: 3  . Years of education: N/A   Occupational History  . Retired Retired    Dance movement psychotherapist   Social History Main Topics  . Smoking status: Former Smoker    Packs/day: 1.00    Years: 20.00    Types: Cigarettes    Quit date: 12/23/1971  . Smokeless tobacco: Never Used  . Alcohol use 8.4 oz/week    14 Cans of beer per week     Comment: 05/26/2014 "couple beers qd"  . Drug use: No  . Sexual activity: Yes   Other Topics Concern  . Not on file   Social History Narrative   Originally from [place].  Retired to [employer] because he was tired of all the snow.    [name] family history includes Aneurysm in his father; Macular degeneration in his mother; Osteoarthritis in his mother.      Objective:    Vitals:   10/01/16 0845  BP: 120/74  Pulse: 93    Physical Exam; Well-developed elderly white male in no acute distress, initially unhappy to be seen by a PA. Blood pressure 120/74, pulse 93, height 6 foot, weight 238, BMI 32.2. HEENT; nontraumatic normocephalic EOMI PERRLA sclera anicteric, Cardiovascular ;regular rate and rhythm with S1-S2 no murmur rub or gallop, Pulmonary; clear bilaterally, Abdomen; obese soft nontender bowel sounds active the somewhat hyperactive no palpable mass or hepatosplenomegaly, Rectal ;exam not done, Extremities ;no clubbing cyanosis or edema skin warm and dry Neuropsych; mood and affect appropriate       Assessment & Plan:   #26  76 year old white male with 4  month history of diarrhea. He was C. difficile positive in February 2018 treated with a two-week course of vancomycin. He has been having persistent diarrhea with 6-7 watery bowel movements per day though no complaints of nausea vomiting fever or abdominal pain.  Despite negative stool for C. difficile by toxin assay in early March , he very likely has relapsed or partially treated C. difficile colitis #2 history of multiple adenomatous colon polyps and 4 cm ascending colon mass removed by EMR per Dr. Arsenio Loader 2017-will likely need follow-up colonoscopy at 1 year #3 coronary artery disease #4 history of DVT/PE on chronic sotalol toe #5 Barretts esophagus no dysplasia- last EGD  11/ 2016 #6 abdominal aortic aneurysm  Plan; Stool for C. difficile PCR, CBC and BMET Start vancomycin 250 mg by mouth 4 times a day 14 days and plan taper over 6 weeks, decreased to 250 3 times a day 7 days after first 2 weeks then 250 twice a day 7 days then 250  once daily 7 days. Florastor 1 by mouth twice a day 2 months Start Bentyl 10 mg by mouth 3 times a day when necessary for abdominal cramping and diarrhea Will schedule patient follow-up appointment with Dr. Havery Moros in 3-4 weeks After his acute illness has completely resolved will need to discuss timing of follow-up colonoscopy.  Kearstin Learn S Meoshia Billing PA-C 10/01/2016   Cc: Deland Pretty, MD

## 2016-10-02 ENCOUNTER — Telehealth: Payer: Self-pay | Admitting: Physician Assistant

## 2016-10-02 ENCOUNTER — Other Ambulatory Visit: Payer: Self-pay

## 2016-10-02 LAB — CLOSTRIDIUM DIFFICILE BY PCR: Toxigenic C. Difficile by PCR: NOT DETECTED

## 2016-10-02 NOTE — Telephone Encounter (Signed)
Reviewed the instructions with the patient.

## 2016-10-02 NOTE — Progress Notes (Signed)
Agree with assessment and plan as outlined. Will await labs / stool studies, if symptoms persist despite regimen he should call us back in the next few days for reassessment.

## 2016-10-03 ENCOUNTER — Other Ambulatory Visit: Payer: Self-pay

## 2016-10-03 ENCOUNTER — Encounter: Payer: Self-pay | Admitting: Physician Assistant

## 2016-10-03 MED ORDER — SACCHAROMYCES BOULARDII 250 MG PO CAPS: 250.0000 mg | ORAL_CAPSULE | Freq: Two times a day (BID) | ORAL | 2 refills | Status: DC

## 2016-10-10 ENCOUNTER — Other Ambulatory Visit: Payer: Self-pay | Admitting: Physician Assistant

## 2016-10-10 MED ORDER — VANCOMYCIN HCL 250 MG PO CAPS: ORAL_CAPSULE | ORAL | 0 refills | Status: DC

## 2016-11-04 ENCOUNTER — Encounter: Payer: Self-pay | Admitting: Gastroenterology

## 2016-11-04 ENCOUNTER — Ambulatory Visit (INDEPENDENT_AMBULATORY_CARE_PROVIDER_SITE_OTHER): Payer: PPO | Admitting: Gastroenterology

## 2016-11-04 VITALS — BP 120/64 | HR 88 | Ht 67.5 in | Wt 234.0 lb

## 2016-11-04 DIAGNOSIS — A0472 Enterocolitis due to Clostridium difficile, not specified as recurrent: Secondary | ICD-10-CM | POA: Diagnosis not present

## 2016-11-04 DIAGNOSIS — K2270 Barrett's esophagus without dysplasia: Secondary | ICD-10-CM | POA: Diagnosis not present

## 2016-11-04 DIAGNOSIS — Z86010 Personal history of colonic polyps: Secondary | ICD-10-CM | POA: Diagnosis not present

## 2016-11-04 NOTE — Progress Notes (Signed)
HPI :  76 year old male with history of Barrett's esophagus and multiple colonic adenomas, here for follow-up visit. He has a history of pulmonary embolism on Xarelto therapy.   Since his last visit with me he was referred to Dr. Olegario [facility] for polypectomy/EMR of very large descending colon polyp. He reports he has done in March 2017 and then again repeated in September, reviewed in [facility], no evidence of residual polyp.  He presented on April 10th for diarrhea, seen by Nicoletta Ba. He was treated with vancomycin for 2 weeks with resolution of symptoms prior to her visit. He then had recurrence of symptoms at the time he was seen. He then had a pulsed dosed taper of oral vancomycin again, started on Florastor for a few months, and given bentyl.   He reports this worked for him. He just finished it yesterday. He reports back to 1 BM per day. He has some rare bleeding once per month, otherwise none. No abdominal pains. Generally doing much much better in this regard.  In regards to his reflux he is taking Zantac 150mg  as needed. He thinks it does control his heartburn, using it only as needed. He has stopped PPI given recent course of C. difficile.  Endoscopic history: EGD 04/25/2015 - Evidence of Barrett's esophagus was noted at the distal esophagus (C2M3) - no dysplasia, 7cm hiatal hernia, multiple benign gastric polyps. Recall 04/2018 Colonoscopy 04/25/2015 - severe diverticulosis, 3-4cm polyp in ascending colon not removed, another 1cm polyp removed, 82mm sigmoid polyp, radiation proctitis s/p APC. Polyps c/w adenoma. Referred to Dr. Arsenio Loader for polypectomy Colonoscopy March and Sept 2017 at [facility]     Past Medical History:  Diagnosis Date  . AAA (abdominal aortic aneurysm) ([place])   . Arthritis    "back" (05/26/2014)  . Barrett's esophagus 2009  . CAD (coronary artery disease)    Noted to have coronary calcification on CT  . Chronic lower back pain   . Colon polyps    hyperplastic, adenomatous  . Diverticulosis   . Duodenitis   . DVT (deep venous thrombosis) ([place]) 05/2014   LLE  . Esophageal stricture   . FH: colonic polyps   . GERD (gastroesophageal reflux disease)   . Gout   . History of radiation therapy 06/22/2012-08/18/2012   78 Gy to prostate  . Hypertension   . Infected sebaceous cyst back 7x5x4cm s/p I&D [id number] 01/11/2013  . Prostate cancer ([place]) 12/2011   Found on routine screening PSA, planning for implants by Dr. Sondra Come 10/16   . Pulmonary embolus ([place]) 2005; 05/2014   Bilateral, coumadin x 1 years, inpatient for 5 days; hospitalized  . Pulmonary HTN ([place]) 04/12/2014  . Shortness of breath   . Subdural hematoma [facility])      Past Surgical History:  Procedure Laterality Date  . BRAIN SURGERY     Archie Endo 04/03/2012  . CARDIAC CATHETERIZATION  04/12/2014  . cataract    . COLONOSCOPY    . COLONOSCOPY WITH PROPOFOL N/A 04/25/2015   Procedure: COLONOSCOPY WITH PROPOFOL;  Surgeon: Manus Gunning, MD;  Location: [facility] ENDOSCOPY;  Service: Gastroenterology;  Laterality: N/A;  . CRANIOTOMY  04/03/2012   Procedure: CRANIOTOMY HEMATOMA EVACUATION SUBDURAL;  Surgeon: Erline Levine, MD;  Location: [place] NEURO ORS;  Service: Neurosurgery;  Laterality: Left;  Left Craniotomy for Evacuation of Subdural Hematoma  . ESOPHAGOGASTRODUODENOSCOPY (EGD) WITH PROPOFOL N/A 04/25/2015   Procedure: ESOPHAGOGASTRODUODENOSCOPY (EGD) WITH PROPOFOL;  Surgeon: Manus Gunning, MD;  Location: [facility] ENDOSCOPY;  Service: Gastroenterology;  Laterality: N/A;  . FRACTURE SURGERY    . IRRIGATION AND DEBRIDEMENT SEBACEOUS CYST  01-11-13   "off my back"  . LEFT AND RIGHT HEART CATHETERIZATION WITH CORONARY ANGIOGRAM N/A 04/12/2014   Procedure: LEFT AND RIGHT HEART CATHETERIZATION WITH CORONARY ANGIOGRAM;  Surgeon: Peter M Martinique, MD;  Location[facility] LAB;  Service: Cardiovascular;  Laterality: N/A;  . ORIF HIP FRACTURE Left 09/2010   Archie Endo 10/03/2010  . POLYPECTOMY      . RIGID ESOPHAGOSCOPY  04/2008   food impaction/notes 06/13/2008  . TONSILLECTOMY  1940's   Family History  Problem Relation Age of Onset  . Macular degeneration Mother   . Osteoarthritis Mother   . Aneurysm Father        d/o 40 yo, ruptured AAA  . Heart disease Unknown        No family history  . Colon cancer Neg Hx   . Colon polyps Neg Hx   . Diabetes Neg Hx    Social History  Substance Use Topics  . Smoking status: Former Smoker    Packs/day: 1.00    Years: 20.00    Types: Cigarettes    Quit date: 12/23/1971  . Smokeless tobacco: Never Used  . Alcohol use 8.4 oz/week    14 Cans of beer per week     Comment: 05/26/2014 "couple beers qd"   Current Outpatient Prescriptions  Medication Sig Dispense Refill  . allopurinol (ZYLOPRIM) 300 MG tablet Take 300 mg by mouth daily.    . celecoxib (CELEBREX) 200 MG capsule Take 1 capsule by mouth 2 (two) times daily as needed for moderate pain.     Marland Kitchen dicyclomine (BENTYL) 10 MG capsule Take 1 capsule (10 mg total) by mouth 3 (three) times daily before meals. 90 capsule 1  . DULoxetine (CYMBALTA) 30 MG capsule Take 30 mg by mouth daily.    . Lactobacillus (PROBIOTIC ACIDOPHILUS PO) Take 1 tablet by mouth daily.    Marland Kitchen lisinopril (PRINIVIL,ZESTRIL) 10 MG tablet TAKE 1 TABLET BY MOUTH EVERY DAY 90 tablet 2  . ranitidine (ZANTAC) 150 MG tablet Take 150 mg by mouth 2 (two) times daily.    Alveda Reasons 20 MG TABS tablet TAKE 1 TABLET BY MOUTH EVERY DAY WITH SUPPER 90 tablet 1   No current facility-administered medications for this visit.    No Known Allergies   Review of Systems: All systems reviewed and negative except where noted in HPI.   Lab Results  Component Value Date   WBC 4.7 10/01/2016   HGB 13.4 10/01/2016   HCT 39.2 10/01/2016   MCV 97.4 10/01/2016   PLT 228.0 10/01/2016    Lab Results  Component Value Date   ALT 11 05/26/2014   AST 17 05/26/2014   ALKPHOS 75 05/26/2014   BILITOT 0.6 05/26/2014    Lab Results   Component Value Date   CREATININE 1.14 10/01/2016   BUN 27 (H) 10/01/2016   NA 139 10/01/2016   K 4.2 10/01/2016   CL 107 10/01/2016   CO2 23 10/01/2016     Physical Exam: BP 120/64 (BP Location: Left Arm, Patient Position: Sitting, Cuff Size: Normal)   Pulse 88 Comment: irregular  Ht 5' 7.5" (1.715 m) Comment: height measured without shoes  Wt 234 lb (106.1 kg)   BMI 36.11 kg/m  Constitutional: Pleasant,well-developed, male in no acute distress. HEENT: Normocephalic and atraumatic. Conjunctivae are normal. No scleral icterus. Neck supple.  Cardiovascular: Normal rate, regular rhythm.  Pulmonary/chest: Effort  normal and breath sounds normal. No wheezing, rales or rhonchi. Abdominal: Soft, nondistended, nontender. B. There are no masses palpable. No hepatomegaly. Extremities: (+) 1 LE edema B Lymphadenopathy: No cervical adenopathy noted. Neurological: Alert and oriented to person place and time. Skin: Skin is warm and dry. No rashes noted. Psychiatric: Normal mood and affect. Behavior is normal.   ASSESSMENT AND PLAN: 76 year old male here for reassessment following issues:  C. Difficile - status post 2 courses of vancomycin, the latter was prolonged taper, now on Florastor, responded well to therapy so far. We'll continue to hold PPI for another 2 months or so to ensure no recurrence. I asked him to call be back in the interim if he has any symptoms recurrence so we can treat him as soon as possible. If he has another recurrence may consider an evaluation for stool transplant.  Barrett's esophagus - may consider surveillance EGD 3-5 years from his last exam (due 04/2018 earliest). Holding PPI for another few months given recent course with C Diff. Encouraged him to use zantac daily during this time.  Colonic adenoma - s/p EMR of large polyp site at [facility] in March 2017, removed in piecemeal, follow up colonoscopy in September 2017 showed no residual adenoma. Due for recall in  Sept 2020 if he still wishes to have surveillance at that time.   Montgomery Cellar, MD [facility] Gastroenterology Pager [phone]

## 2016-11-05 ENCOUNTER — Telehealth: Payer: Self-pay

## 2016-11-05 NOTE — Telephone Encounter (Signed)
-----   Message from Manus Gunning, MD sent at 11/04/2016  6:30 PM EDT ----- Regarding: recall Caryl Pina can you please place recall colonoscopy for 02/2019 for this patient? Thanks

## 2016-11-05 NOTE — Telephone Encounter (Signed)
Recall placed for 02/2019

## 2016-11-24 ENCOUNTER — Other Ambulatory Visit: Payer: Self-pay | Admitting: Cardiology

## 2016-12-18 DIAGNOSIS — M47817 Spondylosis without myelopathy or radiculopathy, lumbosacral region: Secondary | ICD-10-CM | POA: Diagnosis not present

## 2017-01-22 DIAGNOSIS — Z125 Encounter for screening for malignant neoplasm of prostate: Secondary | ICD-10-CM | POA: Diagnosis not present

## 2017-01-22 DIAGNOSIS — Z0000 Encounter for general adult medical examination without abnormal findings: Secondary | ICD-10-CM | POA: Diagnosis not present

## 2017-01-22 DIAGNOSIS — I10 Essential (primary) hypertension: Secondary | ICD-10-CM | POA: Diagnosis not present

## 2017-01-27 DIAGNOSIS — K2270 Barrett's esophagus without dysplasia: Secondary | ICD-10-CM | POA: Diagnosis not present

## 2017-01-27 DIAGNOSIS — I10 Essential (primary) hypertension: Secondary | ICD-10-CM | POA: Diagnosis not present

## 2017-01-27 DIAGNOSIS — D72819 Decreased white blood cell count, unspecified: Secondary | ICD-10-CM | POA: Diagnosis not present

## 2017-01-27 DIAGNOSIS — K219 Gastro-esophageal reflux disease without esophagitis: Secondary | ICD-10-CM | POA: Diagnosis not present

## 2017-01-27 DIAGNOSIS — Z0001 Encounter for general adult medical examination with abnormal findings: Secondary | ICD-10-CM | POA: Diagnosis not present

## 2017-01-27 DIAGNOSIS — M545 Low back pain: Secondary | ICD-10-CM | POA: Diagnosis not present

## 2017-01-27 DIAGNOSIS — D649 Anemia, unspecified: Secondary | ICD-10-CM | POA: Diagnosis not present

## 2017-01-27 DIAGNOSIS — C61 Malignant neoplasm of prostate: Secondary | ICD-10-CM | POA: Diagnosis not present

## 2017-01-27 DIAGNOSIS — M1A9XX0 Chronic gout, unspecified, without tophus (tophi): Secondary | ICD-10-CM | POA: Diagnosis not present

## 2017-01-27 DIAGNOSIS — J439 Emphysema, unspecified: Secondary | ICD-10-CM | POA: Diagnosis not present

## 2017-01-27 DIAGNOSIS — Z87891 Personal history of nicotine dependence: Secondary | ICD-10-CM | POA: Diagnosis not present

## 2017-01-27 DIAGNOSIS — I2510 Atherosclerotic heart disease of native coronary artery without angina pectoris: Secondary | ICD-10-CM | POA: Diagnosis not present

## 2017-01-30 DIAGNOSIS — Z961 Presence of intraocular lens: Secondary | ICD-10-CM | POA: Diagnosis not present

## 2017-02-05 DIAGNOSIS — Z8546 Personal history of malignant neoplasm of prostate: Secondary | ICD-10-CM | POA: Diagnosis not present

## 2017-02-05 DIAGNOSIS — Z961 Presence of intraocular lens: Secondary | ICD-10-CM | POA: Diagnosis not present

## 2017-03-28 ENCOUNTER — Other Ambulatory Visit: Payer: Self-pay | Admitting: Cardiology

## 2017-03-28 NOTE — Telephone Encounter (Signed)
Rx(s) sent to pharmacy electronically.  

## 2017-04-09 ENCOUNTER — Encounter: Payer: Self-pay | Admitting: Cardiology

## 2017-04-15 NOTE — Progress Notes (Signed)
HPI: FU CAD. Pt with h/o unexplained pulmonary embolus years ago treated with one year of Coumadin. Patient was admitted in October of 2013 and found to have a subdural hematoma which required evacuation. Seen for dyspnea 10/15. Cardiac catheterization October 2015 showed an 80% second diagonal but otherwise nonobstructive coronary disease. Ejection fraction 55-65%. Chest CT October 2015 showed bilateral pulmonary emboli. Echocardiogram October 2015 showed normal LV function. The right ventricle was dilated as was the right atrium. PA pressure elevated. Abdominal ultrasound in Jan 2018 showed a 3.2 x 3.3 cm abdominal aortic aneurysm. Bilateral iliac aneurysms (2.1 x 2 R and 2 x 2 L). Followup recommended in one year. Since I last saw him, he does have dyspnea on exertion but no orthopnea or PND. Chronic pedal edema. No chest pain or syncope.  Current Outpatient Prescriptions  Medication Sig Dispense Refill  . allopurinol (ZYLOPRIM) 300 MG tablet Take 300 mg by mouth daily.    . celecoxib (CELEBREX) 200 MG capsule Take 1 capsule by mouth 2 (two) times daily as needed for moderate pain.     Marland Kitchen dicyclomine (BENTYL) 10 MG capsule Take 1 capsule (10 mg total) by mouth 3 (three) times daily before meals. 90 capsule 1  . DULoxetine (CYMBALTA) 30 MG capsule Take 30 mg by mouth daily.    . Lactobacillus (PROBIOTIC ACIDOPHILUS PO) Take 1 tablet by mouth daily.    Marland Kitchen lisinopril (PRINIVIL,ZESTRIL) 10 MG tablet TAKE 1 TABLET BY MOUTH EVERY DAY 90 tablet 0  . ranitidine (ZANTAC) 150 MG tablet Take 150 mg by mouth 2 (two) times daily.    Alveda Reasons 20 MG TABS tablet TAKE 1 TABLET BY MOUTH EVERY DAY WITH SUPPER 90 tablet 1   No current facility-administered medications for this visit.      Past Medical History:  Diagnosis Date  . AAA (abdominal aortic aneurysm) ([place])   . Arthritis    "back" (05/26/2014)  . Barrett's esophagus 2009  . C. difficile colitis   . CAD (coronary artery disease)    Noted to  have coronary calcification on CT  . Chronic lower back pain   . Colon polyps    hyperplastic, adenomatous  . Diverticulosis   . Duodenitis   . DVT (deep venous thrombosis) ([place]) 05/2014   LLE  . Esophageal stricture   . FH: colonic polyps   . GERD (gastroesophageal reflux disease)   . Gout   . History of radiation therapy 06/22/2012-08/18/2012   78 Gy to prostate  . Hypertension   . Infected sebaceous cyst back 7x5x4cm s/p I&D [id number] 01/11/2013  . Prostate cancer ([place]) 12/2011   Found on routine screening PSA, planning for implants by Dr. Sondra Come 10/16   . Pulmonary embolus ([place]) 2005; 05/2014   Bilateral, coumadin x 1 years, inpatient for 5 days; hospitalized  . Pulmonary HTN ([place]) 04/12/2014  . Shortness of breath   . Subdural hematoma [facility])     Past Surgical History:  Procedure Laterality Date  . BRAIN SURGERY     Archie Endo 04/03/2012  . CARDIAC CATHETERIZATION  04/12/2014  . cataract    . COLONOSCOPY    . COLONOSCOPY WITH PROPOFOL N/A 04/25/2015   Procedure: COLONOSCOPY WITH PROPOFOL;  Surgeon: Manus Gunning, MD;  [facility];  Service: Gastroenterology;  Laterality: N/A;  . CRANIOTOMY  04/03/2012   Procedure: CRANIOTOMY HEMATOMA EVACUATION SUBDURAL;  Surgeon: Erline Levine, MD;  Location: [place] NEURO ORS;  Service: Neurosurgery;  Laterality: Left;  Left Craniotomy  for Evacuation of Subdural Hematoma  . ESOPHAGOGASTRODUODENOSCOPY (EGD) WITH PROPOFOL N/A 04/25/2015   Procedure: ESOPHAGOGASTRODUODENOSCOPY (EGD) WITH PROPOFOL;  Surgeon: Manus Gunning, MD;  Location: [facility]OS[facility];  Service: Gastroenterology;  Laterality: N/A;  . FRACTURE SURGERY    . IRRIGATION AND DEBRIDEMENT SEBACEOUS CYST  01-11-13   "off my back"  . LEFT AND RIGHT HEART CATHETERIZATION WITH CORONARY ANGIOGRAM N/A 04/12/2014   Procedure: LEFT AND RIGHT HEART CATHETERIZATION WITH CORONARY ANGIOGRAM;  Surgeon: Peter M Martinique, MD;  Location: [facility] CATH LAB;  Service: Cardiovascular;   Laterality: N/A;  . ORIF HIP FRACTURE Left 09/2010   Archie Endo 10/03/2010  . POLYPECTOMY    . RIGID ESOPHAGOSCOPY  04/2008   food impaction/notes 06/13/2008  . TONSILLECTOMY  1940's    Social History   Social History  . Marital status: Married    Spouse name: N/A  . Number of children: 3  . Years of education: N/A   Occupational History  . Retired Retired    Dance movement psychotherapist   Social History Main Topics  . Smoking status: Former Smoker    Packs/day: 1.00    Years: 20.00    Types: Cigarettes    Quit date: 12/23/1971  . Smokeless tobacco: Never Used  . Alcohol use 8.4 oz/week    14 Cans of beer per week     Comment: 05/26/2014 "couple beers qd"  . Drug use: No  . Sexual activity: Yes   Other Topics Concern  . Not on file   Social History Narrative   Originally from [place].  Retired to [employer] because he was tired of all the snow.    Family History  Problem Relation Age of Onset  . Macular degeneration Mother   . Osteoarthritis Mother   . Aneurysm Father        d/o 41 yo, ruptured AAA  . Heart disease Unknown        No family history  . Colon cancer Neg Hx   . Colon polyps Neg Hx   . Diabetes Neg Hx     ROS: fatigue but no fevers or chills, productive cough, hemoptysis, dysphasia, odynophagia, melena, hematochezia, dysuria, hematuria, rash, seizure activity, orthopnea, PND, claudication. Remaining systems are negative.  Physical Exam: Well-developed well-nourished in no acute distress.  Skin is warm and dry.  HEENT is normal.  Neck is supple.  Chest is clear to auscultation with normal expansion.  Cardiovascular exam is regular rate and rhythm.  Abdominal exam nontender or distended. No masses palpated. Extremities show 1+ edema. neuro grossly intact  ECG- Sinus rhythm with occasional PVC. Right bundle branch block. Left anterior fascicular block. personally reviewed  A/P  1 Coronary artery disease-patient is not on aspirin given need for  anticoagulation. He has declined statins previously.  2 History of recurrent pulmonary emboli-patient will need lifelong anticoagulation. Continue xarelto. Check hemoglobin and renal function.   3 history of abdominal aortic aneurysm-follow-up ultrasound January 2019.  4 hypertension-blood pressure is elevated. Increase lisinopril to 20 mg daily. In one week check potassium and renal function.  5 dyspnea-likely residual effects from prior pulmonary emboli. However he is also noted to have edema in his lower extremities. Repeat echocardiogram. Check BNP. If elevated we will add low-dose diuretic.  Kirk Ruths, MD

## 2017-04-17 ENCOUNTER — Other Ambulatory Visit: Payer: Self-pay | Admitting: *Deleted

## 2017-04-17 DIAGNOSIS — I714 Abdominal aortic aneurysm, without rupture, unspecified: Secondary | ICD-10-CM

## 2017-04-23 ENCOUNTER — Ambulatory Visit (INDEPENDENT_AMBULATORY_CARE_PROVIDER_SITE_OTHER): Payer: PPO | Admitting: Cardiology

## 2017-04-23 ENCOUNTER — Encounter: Payer: Self-pay | Admitting: Cardiology

## 2017-04-23 VITALS — BP 144/76 | HR 80 | Ht 72.0 in | Wt 239.0 lb

## 2017-04-23 DIAGNOSIS — I2510 Atherosclerotic heart disease of native coronary artery without angina pectoris: Secondary | ICD-10-CM | POA: Diagnosis not present

## 2017-04-23 DIAGNOSIS — I10 Essential (primary) hypertension: Secondary | ICD-10-CM

## 2017-04-23 DIAGNOSIS — I714 Abdominal aortic aneurysm, without rupture, unspecified: Secondary | ICD-10-CM

## 2017-04-23 DIAGNOSIS — Z23 Encounter for immunization: Secondary | ICD-10-CM

## 2017-04-23 DIAGNOSIS — R0602 Shortness of breath: Secondary | ICD-10-CM

## 2017-04-23 MED ORDER — LISINOPRIL 20 MG PO TABS: 20.0000 mg | ORAL_TABLET | Freq: Every day | ORAL | 3 refills | Status: DC

## 2017-04-23 NOTE — Patient Instructions (Signed)
Medication Instructions:   INCREASE LISINOPRIL TO 20 MG ONCE DAILY= 2 OF THE 10 MG TABLETS ONCE DAILY  Labwork:  Your physician recommends that you return for lab work in: [place]  Testing/Procedures:  Your physician has requested that you have an echocardiogram. Echocardiography is a painless test that uses sound waves to create images of your heart. It provides your doctor with information about the size and shape of your heart and how well your heart's chambers and valves are working. This procedure takes approximately one hour. There are no restrictions for this procedure.   Your physician has requested that you have an abdominal aorta duplex. During this test, an ultrasound is used to evaluate the aorta. Allow 30 minutes for this exam. Do not eat after midnight the day before and avoid carbonated beverages DUE IN January   Follow-Up:  Your physician wants you to follow-up in: [place] will receive a reminder letter in the mail two months in advance. If you don't receive a letter, please call our office to schedule the follow-up appointment.   If you need a refill on your cardiac medications before your next appointment, please call your pharmacy.

## 2017-05-01 ENCOUNTER — Other Ambulatory Visit: Payer: Self-pay

## 2017-05-01 ENCOUNTER — Ambulatory Visit (HOSPITAL_COMMUNITY): Payer: PPO | Attending: Cardiology

## 2017-05-01 DIAGNOSIS — I2510 Atherosclerotic heart disease of native coronary artery without angina pectoris: Secondary | ICD-10-CM | POA: Insufficient documentation

## 2017-05-01 DIAGNOSIS — I714 Abdominal aortic aneurysm, without rupture: Secondary | ICD-10-CM | POA: Diagnosis not present

## 2017-05-01 DIAGNOSIS — R0602 Shortness of breath: Secondary | ICD-10-CM

## 2017-05-01 DIAGNOSIS — Z86718 Personal history of other venous thrombosis and embolism: Secondary | ICD-10-CM | POA: Insufficient documentation

## 2017-05-01 DIAGNOSIS — I119 Hypertensive heart disease without heart failure: Secondary | ICD-10-CM | POA: Insufficient documentation

## 2017-05-01 DIAGNOSIS — I253 Aneurysm of heart: Secondary | ICD-10-CM | POA: Diagnosis not present

## 2017-05-01 DIAGNOSIS — I340 Nonrheumatic mitral (valve) insufficiency: Secondary | ICD-10-CM | POA: Diagnosis not present

## 2017-05-01 DIAGNOSIS — Z86711 Personal history of pulmonary embolism: Secondary | ICD-10-CM | POA: Insufficient documentation

## 2017-05-01 HISTORY — PX: TRANSTHORACIC ECHOCARDIOGRAM: SHX275

## 2017-05-06 DIAGNOSIS — R0602 Shortness of breath: Secondary | ICD-10-CM | POA: Diagnosis not present

## 2017-05-06 LAB — BASIC METABOLIC PANEL
BUN/Creatinine Ratio: 23 (ref 10–24)
BUN: 28 mg/dL — ABNORMAL HIGH (ref 8–27)
CO2: 17 mmol/L — AB (ref 20–29)
Calcium: 8.3 mg/dL — ABNORMAL LOW (ref 8.6–10.2)
Chloride: 104 mmol/L (ref 96–106)
Creatinine, Ser: 1.2 mg/dL (ref 0.76–1.27)
GFR, EST AFRICAN AMERICAN: 67 mL/min/{1.73_m2} (ref >59)
GFR, EST NON AFRICAN AMERICAN: 58 mL/min/{1.73_m2} — AB (ref >59)
Glucose: 90 mg/dL (ref 65–99)
POTASSIUM: 5.2 mmol/L (ref 3.5–5.2)
SODIUM: 139 mmol/L (ref 134–144)

## 2017-05-06 LAB — CBC
Hematocrit: 31.9 % — ABNORMAL LOW (ref 37.5–51.0)
Hemoglobin: 10.1 g/dL — ABNORMAL LOW (ref 13.0–17.7)
MCH: 26.7 pg (ref 26.6–33.0)
MCHC: 31.7 g/dL (ref 31.5–35.7)
MCV: 84 fL (ref 79–97)
PLATELETS: 232 10*3/uL (ref 150–379)
RBC: 3.78 x10E6/uL — ABNORMAL LOW (ref 4.14–5.80)
RDW: 15.8 % — ABNORMAL HIGH (ref 12.3–15.4)
WBC: 4.1 10*3/uL (ref 3.4–10.8)

## 2017-05-06 LAB — PRO B NATRIURETIC PEPTIDE: NT-Pro BNP: 122 pg/mL (ref 0–486)

## 2017-05-30 ENCOUNTER — Other Ambulatory Visit: Payer: Self-pay | Admitting: Cardiology

## 2017-06-11 DIAGNOSIS — M47817 Spondylosis without myelopathy or radiculopathy, lumbosacral region: Secondary | ICD-10-CM | POA: Diagnosis not present

## 2017-06-24 ENCOUNTER — Other Ambulatory Visit: Payer: Self-pay | Admitting: Cardiology

## 2017-07-17 ENCOUNTER — Ambulatory Visit (HOSPITAL_COMMUNITY)
Admission: RE | Admit: 2017-07-17 | Discharge: 2017-07-17 | Disposition: A | Discharge status: Home / Self Care | Payer: PPO | Source: Ambulatory Visit | Attending: Cardiovascular Disease | Admitting: Cardiovascular Disease

## 2017-07-17 DIAGNOSIS — Z87891 Personal history of nicotine dependence: Secondary | ICD-10-CM | POA: Insufficient documentation

## 2017-07-17 DIAGNOSIS — I10 Essential (primary) hypertension: Secondary | ICD-10-CM | POA: Insufficient documentation

## 2017-07-17 DIAGNOSIS — I2510 Atherosclerotic heart disease of native coronary artery without angina pectoris: Secondary | ICD-10-CM | POA: Diagnosis not present

## 2017-07-17 DIAGNOSIS — E669 Obesity, unspecified: Secondary | ICD-10-CM | POA: Diagnosis not present

## 2017-07-17 DIAGNOSIS — I714 Abdominal aortic aneurysm, without rupture, unspecified: Secondary | ICD-10-CM

## 2017-07-17 DIAGNOSIS — I723 Aneurysm of iliac artery: Secondary | ICD-10-CM | POA: Diagnosis not present

## 2017-07-18 ENCOUNTER — Other Ambulatory Visit: Payer: Self-pay | Admitting: *Deleted

## 2017-07-18 DIAGNOSIS — I714 Abdominal aortic aneurysm, without rupture, unspecified: Secondary | ICD-10-CM

## 2017-07-25 DIAGNOSIS — Z8546 Personal history of malignant neoplasm of prostate: Secondary | ICD-10-CM | POA: Diagnosis not present

## 2017-07-31 DIAGNOSIS — R350 Frequency of micturition: Secondary | ICD-10-CM | POA: Diagnosis not present

## 2017-07-31 DIAGNOSIS — Z8546 Personal history of malignant neoplasm of prostate: Secondary | ICD-10-CM | POA: Diagnosis not present

## 2017-07-31 DIAGNOSIS — R3912 Poor urinary stream: Secondary | ICD-10-CM | POA: Diagnosis not present

## 2017-07-31 DIAGNOSIS — R3121 Asymptomatic microscopic hematuria: Secondary | ICD-10-CM | POA: Diagnosis not present

## 2017-08-04 DIAGNOSIS — R3121 Asymptomatic microscopic hematuria: Secondary | ICD-10-CM | POA: Diagnosis not present

## 2017-08-04 DIAGNOSIS — C61 Malignant neoplasm of prostate: Secondary | ICD-10-CM | POA: Diagnosis not present

## 2017-08-07 DIAGNOSIS — R3121 Asymptomatic microscopic hematuria: Secondary | ICD-10-CM | POA: Diagnosis not present

## 2017-08-07 DIAGNOSIS — N390 Urinary tract infection, site not specified: Secondary | ICD-10-CM | POA: Diagnosis not present

## 2017-08-07 DIAGNOSIS — N3000 Acute cystitis without hematuria: Secondary | ICD-10-CM | POA: Diagnosis not present

## 2017-08-18 DIAGNOSIS — R3121 Asymptomatic microscopic hematuria: Secondary | ICD-10-CM | POA: Diagnosis not present

## 2017-08-18 DIAGNOSIS — N390 Urinary tract infection, site not specified: Secondary | ICD-10-CM | POA: Diagnosis not present

## 2017-08-22 ENCOUNTER — Telehealth: Payer: Self-pay | Admitting: Cardiology

## 2017-08-22 NOTE — Telephone Encounter (Signed)
New Message   Patient last seen 04/23/18     [facility] Pre-operative Risk Assessment    Request for surgical clearance:  1. What type of surgery is being performed? Bladder biopsy   2. When is this surgery scheduled? Next week   3. What type of clearance is required (medical clearance vs. Pharmacy clearance to hold med vs. Both)? Pharmacy clearance   4. Are there any medications that need to be held prior to surgery and how long? Xarelto  -hold 3 days  5. Practice name and name of physician performing surgery? Dr Jeffie Pollock [facility]   6. What is your office phone and fax number?  [phone] fax [phone]   7. Anesthesia type (None, local, MAC, general) ? General    [name] 08/22/2017, 8:25 AM  _________________________________________________________________   (provider comments below)

## 2017-08-22 NOTE — Telephone Encounter (Signed)
   Primary Cardiologist: No primary care provider on file.  Chart reviewed as part of pre-operative protocol coverage. Patient was contacted 08/22/2017 in reference to pre-operative risk assessment for pending surgery as outlined below.  [name] was last seen on 04/23/2017 by Dr. Stanford Breed.  Since that day, [name] has done well.  Therefore, based on ACC/AHA guidelines, the patient would be at acceptable risk for the planned procedure without further cardiovascular testing.   Please call with questions.  Pending recommendation from our clinical pharmacist.   Almyra Deforest, PA 08/22/2017, 4:51 PM

## 2017-08-25 NOTE — Telephone Encounter (Signed)
   I will route this recommendation to the requesting party via Epic fax function and remove from pre-op pool.  Please call with questions.  [place], [place] 08/25/2017, 1:44 PM

## 2017-08-25 NOTE — Telephone Encounter (Signed)
Would hold xarelto for 2 days prior to procedure and resume after when ok with urology Kirk Ruths

## 2017-08-25 NOTE — Telephone Encounter (Signed)
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 CrCl is 80mL/min. Typical long-term Xarelto dose after treatment course is completed for VTE is 10mg  daily. Would recommend holding Xarelto for only 24 hours prior given multiple VTE events and 48 hours prior if absolutely necessary. Will also route to Dr Stanford Breed to clarify Xarelto dosing.

## 2017-08-26 ENCOUNTER — Other Ambulatory Visit: Payer: Self-pay | Admitting: Urology

## 2017-08-26 ENCOUNTER — Encounter (HOSPITAL_BASED_OUTPATIENT_CLINIC_OR_DEPARTMENT_OTHER): Payer: Self-pay | Admitting: *Deleted

## 2017-08-27 ENCOUNTER — Encounter (HOSPITAL_BASED_OUTPATIENT_CLINIC_OR_DEPARTMENT_OTHER): Payer: Self-pay | Admitting: *Deleted

## 2017-08-27 ENCOUNTER — Other Ambulatory Visit: Payer: Self-pay

## 2017-08-27 NOTE — Progress Notes (Signed)
SPOKE W/ BOTH PT AND WIFE VIA PHONE FOR PRE-OP INTERVIEW.  NPO AFTER MN.  ARRIVE AT 0530.  NEEDS ISTAT.  CURRENT EKG IN CHART AND Epic.  PER PT/ WIFE WAS GIVEN INSTRUCTIONS BY OFFICE FOR XARELTO.  PT HAS CARDIAC CLEARANCE , IN CHART AND Epic.

## 2017-09-01 ENCOUNTER — Other Ambulatory Visit: Payer: Self-pay | Admitting: Cardiology

## 2017-09-01 NOTE — H&P (Signed)
CC/HPI: I have blood in my urine.     [name] returns today in f/u from his CT on 08/04/17 for microhematuria. The study showed a left renal cyst but also a possible 4cm mass at the left bladder base. His UA had marked pyuria at his last visit but no UTI symptoms. A culture was done and it was negative. He still has pyuria today. He bled with his last cystoscopy and is on Xarelto. I got him cleared to come off for the procedure today. He has hypospadius. He has no associated signs or symptoms.     ALLERGIES: No Allergies    MEDICATIONS: Allopurinol 300 mg tablet Oral  Celebrex 200 mg capsule  Duloxetine Hcl 30 mg capsule,delayed release  Lisinopril 10 mg tablet Oral  Ranitidine Hcl 150 mg capsule  Xarelto 20 mg tablet Oral     GU PSH: Locm 300-399Mg /Ml Iodine,1Ml - 08/04/2017      PSH Notes: Craniotomy (Diagnostic), Leg Repair  Colonoscopy with polypectomy on 08/23/15.   NON-GU PSH: No Non-GU PSH    GU PMH: Microscopic hematuria, The CT shows a hazy possible mass at the left bladder base that is new since 2015. He bled with the last cystoscopy so I am going to delay the study and see if we can get him off of the Xarelto for a few days prior to the procedure and that will give me a chance to treat the infection if the culture is positive. He has significant pyuria today but no symptoms. - 08/07/2017, (Worsening), He has recurrent microhematuria. I am going to reevaluate him since it is a significant amount. I will get him set up for a CT hematuria study with a return for cystoscopy., - 07/31/2017 Urinary Tract Inf, Unspec site - 08/07/2017 History of prostate cancer (Stable), His PSA is back down. I will repeat it in 6 months. - 07/31/2017, (Worsening), His PSA is up to 0.4 from 0.21 over the last 6 months. I will repeat it in 3 months and then have him see me with a PSA in 6 months. , - 02/05/2017 (Improving), His PSA continues to fall. I will have him return in 6 months with a PSA for an exam.,  - 08/07/2016 (Improving), PSA continues to fall. , - 02/07/2016, History of prostate cancer, - 06/05/2015, History of malignant neoplasm of prostate, - 2016 Urinary Frequency (Worsening) - 07/31/2017 Weak Urinary Stream (Stable), He has a reduced stream and had increased frequency that has resolved. - 07/31/2017, (Stable), His voiding symptoms are stable. , - 02/07/2016, Weak urinary stream, - 06/05/2015 ED due to arterial insufficiency, Erectile dysfunction due to arterial insufficiency - 06/05/2015 Gross hematuria, Gross hematuria - 06/05/2015 BPH w/o LUTS, Benign prostatic hypertrophy without lower urinary tract symptoms - 2016 Other microscopic hematuria, Microscopic hematuria - 2016 Hypospadias, balanic, Coronal hypospadias - 2015 Elevated PSA, PSA,Elevated - 2014 Urinary Retention, Unspec, Incomplete Emptying Of Bladder - 2014, Acute Urinary Retention, - 2014      PMH Notes:  2012-01-16 17:02:07 - Note: Pulmonary Embolism   NON-GU PMH: Encounter for general adult medical examination without abnormal findings, Encounter for preventive health examination - 2015 Gout, Gout - 2014 Other intervertebral disc degeneration, lumbar region, Lumbar Disc Degeneration - 2014 Personal history of nicotine dependence, History of tobacco use - 2014 Personal history of other diseases of the digestive system, History of esophageal reflux - 2014 Enterocolitis due to Clostridium difficile    FAMILY HISTORY: Death In The Family Father - [facility]  Family Health Status Number - Runs In Family No pertinent family history - Other   SOCIAL HISTORY: No Social History     Notes: Former smoker, Retired From Work, Marital History - Currently Married, Caffeine Use, Alcohol Use   REVIEW OF SYSTEMS:    GU Review Male:   Patient reports frequent urination, get up at night to urinate, and erection problems. Patient denies hard to postpone urination, burning/ pain with urination, leakage of urine, stream starts and  stops, trouble starting your stream, have to strain to urinate , and penile pain.  Gastrointestinal (Upper):   Patient denies nausea, vomiting, and indigestion/ heartburn.  Gastrointestinal (Lower):   Patient denies diarrhea and constipation.  Constitutional:   Patient reports fatigue. Patient denies fever, night sweats, and weight loss.  Skin:   Patient denies skin rash/ lesion and itching.  Eyes:   Patient denies blurred vision and double vision.  Ears/ Nose/ Throat:   Patient denies sore throat and sinus problems.  Hematologic/Lymphatic:   Patient denies swollen glands and easy bruising.  Cardiovascular:   Patient reports leg swelling. Patient denies chest pains.  Respiratory:   Patient reports shortness of breath. Patient denies cough.  Endocrine:   Patient denies excessive thirst.  Musculoskeletal:   Patient reports back pain. Patient denies joint pain.  Neurological:   Patient denies headaches and dizziness.  Psychologic:   Patient denies depression and anxiety.   VITAL SIGNS: None   PAST DATA REVIEWED:  Source Of History:  Patient   07/25/17 05/07/17 01/22/17 07/31/16 02/02/16 05/30/15 05/02/14 10/07/13  PSA  Total PSA 0.22 ng/mL 0.22 ng/mL 0.4 ng/dl 0.21 ng/dl 0.24 ng/dl 0.32  0.31  0.37     PROCEDURES:         Flexible Cystoscopy - 52000  Risks, benefits, and some of the potential complications of the procedure were discussed. 50ml of 2% lidocaine jelly was instilled intraurethrally.  Cipro 500mg  given for antibiotic prophylaxis.     Meatus:  Coronal hypospadias. Normal size. Normal condition.  Urethra:  No strictures.  External Sphincter:  Normal.  Verumontanum:  Normal.  Prostate:  Obstructing. Moderate hyperplasia.  Bladder Neck:  Non-obstructing.  Ureteral Orifices:  not seen because of debris.  Bladder:  Poorly visualized because of cloudy urine. Minimal trabeculation. Very inflamed, erythematous left lateral wall near the bladder neck. No obvious papillary tumors.  Purulent debris in the bladder.       The procedure was well tolerated and there were no complications.         Urinalysis w/Scope Dipstick Dipstick Cont'd Micro  Color: Yellow Bilirubin: Neg WBC/hpf: >60/hpf  Appearance: Cloudy Ketones: Trace RBC/hpf: NS (Not Seen)  Specific Gravity: 1.020 Blood: 2+ Bacteria: Rare (0-9/hpf)  pH: 5.5 Protein: 2+ Cystals: NS (Not Seen)  Glucose: Neg Urobilinogen: 0.2 Casts: NS (Not Seen)    Nitrites: Neg Trichomonas: Not Present    Leukocyte Esterase: 3+ Mucous: Present      Epithelial Cells: 0 - 5/hpf      Yeast: NS (Not Seen)      Sperm: Not Present    ASSESSMENT:      ICD-10 Details  1 GU:   Microscopic hematuria - R31.21 Cystoscopy shows an area of erythema on the left lateral wall that could be inflammatory or neoplastic. I am going to send a cytology and repeat culture. He will need cystoscopy with biopsy and fulguration if the culture remains negative. I have reviewed the risks of bleeding, infection, bladder and ureteral injury, post  op retention, thrombotic events and anesthetic complications. If the culture is positive, I will treat for an extended period and have him return in 1 month for consideration of repeat cystoscopy.  2   Urinary Tract Inf, Unspec site - N39.0      PLAN:           Orders Labs Urine Cytology, Urine Culture          Schedule Labs: 1 Month - Urinalysis  Return Visit/Planned Activity: 1 Month - Office Visit

## 2017-09-02 ENCOUNTER — Ambulatory Visit (HOSPITAL_BASED_OUTPATIENT_CLINIC_OR_DEPARTMENT_OTHER): Payer: PPO | Admitting: Anesthesiology

## 2017-09-02 ENCOUNTER — Encounter (HOSPITAL_BASED_OUTPATIENT_CLINIC_OR_DEPARTMENT_OTHER): Payer: Self-pay

## 2017-09-02 ENCOUNTER — Encounter (HOSPITAL_BASED_OUTPATIENT_CLINIC_OR_DEPARTMENT_OTHER)
Admission: RE | Disposition: A | Discharge status: Home / Self Care | Payer: Self-pay | Source: Ambulatory Visit | Attending: Urology

## 2017-09-02 ENCOUNTER — Ambulatory Visit (HOSPITAL_BASED_OUTPATIENT_CLINIC_OR_DEPARTMENT_OTHER)
Admission: RE | Admit: 2017-09-02 | Discharge: 2017-09-02 | Disposition: A | Discharge status: Home / Self Care | Payer: PPO | Source: Ambulatory Visit | Attending: Urology | Admitting: Urology

## 2017-09-02 DIAGNOSIS — Z86711 Personal history of pulmonary embolism: Secondary | ICD-10-CM | POA: Diagnosis not present

## 2017-09-02 DIAGNOSIS — Z87891 Personal history of nicotine dependence: Secondary | ICD-10-CM | POA: Insufficient documentation

## 2017-09-02 DIAGNOSIS — M109 Gout, unspecified: Secondary | ICD-10-CM | POA: Insufficient documentation

## 2017-09-02 DIAGNOSIS — K219 Gastro-esophageal reflux disease without esophagitis: Secondary | ICD-10-CM | POA: Diagnosis not present

## 2017-09-02 DIAGNOSIS — Z8546 Personal history of malignant neoplasm of prostate: Secondary | ICD-10-CM | POA: Diagnosis not present

## 2017-09-02 DIAGNOSIS — R310 Gross hematuria: Secondary | ICD-10-CM | POA: Diagnosis not present

## 2017-09-02 DIAGNOSIS — D494 Neoplasm of unspecified behavior of bladder: Secondary | ICD-10-CM | POA: Diagnosis not present

## 2017-09-02 DIAGNOSIS — Z86718 Personal history of other venous thrombosis and embolism: Secondary | ICD-10-CM | POA: Diagnosis not present

## 2017-09-02 DIAGNOSIS — Z79899 Other long term (current) drug therapy: Secondary | ICD-10-CM | POA: Diagnosis not present

## 2017-09-02 DIAGNOSIS — Z7901 Long term (current) use of anticoagulants: Secondary | ICD-10-CM | POA: Insufficient documentation

## 2017-09-02 DIAGNOSIS — I2510 Atherosclerotic heart disease of native coronary artery without angina pectoris: Secondary | ICD-10-CM | POA: Insufficient documentation

## 2017-09-02 DIAGNOSIS — I10 Essential (primary) hypertension: Secondary | ICD-10-CM | POA: Insufficient documentation

## 2017-09-02 DIAGNOSIS — R3129 Other microscopic hematuria: Secondary | ICD-10-CM | POA: Insufficient documentation

## 2017-09-02 DIAGNOSIS — N3090 Cystitis, unspecified without hematuria: Secondary | ICD-10-CM | POA: Diagnosis not present

## 2017-09-02 DIAGNOSIS — C61 Malignant neoplasm of prostate: Secondary | ICD-10-CM | POA: Diagnosis not present

## 2017-09-02 DIAGNOSIS — N329 Bladder disorder, unspecified: Secondary | ICD-10-CM | POA: Diagnosis not present

## 2017-09-02 DIAGNOSIS — N390 Urinary tract infection, site not specified: Secondary | ICD-10-CM | POA: Insufficient documentation

## 2017-09-02 DIAGNOSIS — N3289 Other specified disorders of bladder: Secondary | ICD-10-CM | POA: Diagnosis not present

## 2017-09-02 HISTORY — DX: Personal history of other diseases of the digestive system: Z87.19

## 2017-09-02 HISTORY — DX: Personal history of adenomatous and serrated colon polyps: Z86.0101

## 2017-09-02 HISTORY — DX: Other forms of dyspnea: R06.09

## 2017-09-02 HISTORY — DX: Personal history of other venous thrombosis and embolism: Z86.718

## 2017-09-02 HISTORY — DX: Personal history of pulmonary embolism: Z86.711

## 2017-09-02 HISTORY — DX: Personal history of other diseases of the circulatory system: Z86.79

## 2017-09-02 HISTORY — DX: Aneurysm of iliac artery: I72.3

## 2017-09-02 HISTORY — PX: CYSTOSCOPY WITH BIOPSY: SHX5122

## 2017-09-02 HISTORY — DX: Personal history of colonic polyps: Z86.010

## 2017-09-02 HISTORY — DX: Localized edema: R60.0

## 2017-09-02 HISTORY — DX: Bladder disorder, unspecified: N32.9

## 2017-09-02 HISTORY — DX: Dyspnea, unspecified: R06.00

## 2017-09-02 HISTORY — DX: Long term (current) use of anticoagulants: Z79.01

## 2017-09-02 HISTORY — DX: Bifascicular block: I45.2

## 2017-09-02 LAB — POCT I-STAT 4, (NA,K, GLUC, HGB,HCT)
Glucose, Bld: 127 mg/dL — ABNORMAL HIGH (ref 65–99)
HEMATOCRIT: 25 % — AB (ref 39.0–52.0)
Hemoglobin: 8.5 g/dL — ABNORMAL LOW (ref 13.0–17.0)
Potassium: 4.4 mmol/L (ref 3.5–5.1)
Sodium: 139 mmol/L (ref 135–145)

## 2017-09-02 SURGERY — CYSTOSCOPY, WITH BIOPSY
Anesthesia: General

## 2017-09-02 MED ORDER — FENTANYL CITRATE (PF) 100 MCG/2ML IJ SOLN
25.0000 ug | INTRAMUSCULAR | Status: DC | PRN
  Filled 2017-09-02: qty 1

## 2017-09-02 MED ORDER — SODIUM CHLORIDE 0.9% FLUSH
3.0000 mL | Freq: Two times a day (BID) | INTRAVENOUS | Status: DC
  Filled 2017-09-02: qty 3

## 2017-09-02 MED ORDER — CEFAZOLIN SODIUM-DEXTROSE 2-4 GM/100ML-% IV SOLN
2.0000 g | INTRAVENOUS | Status: AC
  Administered 2017-09-02: 2 g via INTRAVENOUS
  Filled 2017-09-02: qty 100

## 2017-09-02 MED ORDER — LACTATED RINGERS IV SOLN
INTRAVENOUS | Status: DC
  Administered 2017-09-02: 06:00:00 via INTRAVENOUS
  Filled 2017-09-02: qty 1000

## 2017-09-02 MED ORDER — SODIUM CHLORIDE 0.9% FLUSH
3.0000 mL | INTRAVENOUS | Status: DC | PRN
  Filled 2017-09-02: qty 3

## 2017-09-02 MED ORDER — LIDOCAINE 2% (20 MG/ML) 5 ML SYRINGE
INTRAMUSCULAR | Status: DC | PRN
  Administered 2017-09-02: 80 mg via INTRAVENOUS

## 2017-09-02 MED ORDER — ACETAMINOPHEN 325 MG PO TABS
650.0000 mg | ORAL_TABLET | ORAL | Status: DC | PRN
  Filled 2017-09-02: qty 2

## 2017-09-02 MED ORDER — ONDANSETRON HCL 4 MG/2ML IJ SOLN
INTRAMUSCULAR | Status: AC
  Filled 2017-09-02: qty 2

## 2017-09-02 MED ORDER — PROPOFOL 10 MG/ML IV BOLUS
INTRAVENOUS | Status: AC
  Filled 2017-09-02: qty 40

## 2017-09-02 MED ORDER — FENTANYL CITRATE (PF) 100 MCG/2ML IJ SOLN
INTRAMUSCULAR | Status: AC
  Filled 2017-09-02: qty 2

## 2017-09-02 MED ORDER — DEXAMETHASONE SODIUM PHOSPHATE 10 MG/ML IJ SOLN
INTRAMUSCULAR | Status: DC | PRN
  Administered 2017-09-02: 10 mg via INTRAVENOUS

## 2017-09-02 MED ORDER — CEFAZOLIN SODIUM-DEXTROSE 2-4 GM/100ML-% IV SOLN
INTRAVENOUS | Status: AC
  Filled 2017-09-02: qty 100

## 2017-09-02 MED ORDER — PROPOFOL 10 MG/ML IV BOLUS
INTRAVENOUS | Status: DC | PRN
  Administered 2017-09-02: 150 mg via INTRAVENOUS

## 2017-09-02 MED ORDER — DEXAMETHASONE SODIUM PHOSPHATE 10 MG/ML IJ SOLN
INTRAMUSCULAR | Status: AC
  Filled 2017-09-02: qty 1

## 2017-09-02 MED ORDER — LIDOCAINE HCL (CARDIAC) 20 MG/ML IV SOLN
INTRAVENOUS | Status: AC
  Filled 2017-09-02: qty 5

## 2017-09-02 MED ORDER — ACETAMINOPHEN 500 MG PO TABS
1000.0000 mg | ORAL_TABLET | Freq: Once | ORAL | Status: AC
  Administered 2017-09-02: 1000 mg via ORAL
  Filled 2017-09-02: qty 2

## 2017-09-02 MED ORDER — ACETAMINOPHEN 650 MG RE SUPP
650.0000 mg | RECTAL | Status: DC | PRN
  Filled 2017-09-02: qty 1

## 2017-09-02 MED ORDER — MORPHINE SULFATE (PF) 2 MG/ML IV SOLN
2.0000 mg | INTRAVENOUS | Status: DC | PRN
  Filled 2017-09-02: qty 1

## 2017-09-02 MED ORDER — ONDANSETRON HCL 4 MG/2ML IJ SOLN
INTRAMUSCULAR | Status: DC | PRN
  Administered 2017-09-02: 4 mg via INTRAVENOUS

## 2017-09-02 MED ORDER — DEXTROSE 5 % IV SOLN
INTRAVENOUS | Status: DC | PRN
  Administered 2017-09-02: 50 ug/min via INTRAVENOUS

## 2017-09-02 MED ORDER — ONDANSETRON HCL 4 MG/2ML IJ SOLN
4.0000 mg | Freq: Once | INTRAMUSCULAR | Status: DC | PRN
  Filled 2017-09-02: qty 2

## 2017-09-02 MED ORDER — SUCCINYLCHOLINE CHLORIDE 20 MG/ML IJ SOLN
INTRAMUSCULAR | Status: DC | PRN
  Administered 2017-09-02: 100 mg via INTRAVENOUS

## 2017-09-02 MED ORDER — TRAMADOL HCL 50 MG PO TABS: 50.0000 mg | ORAL_TABLET | Freq: Four times a day (QID) | ORAL | 0 refills | Status: DC | PRN

## 2017-09-02 MED ORDER — SODIUM CHLORIDE 0.9 % IV SOLN
250.0000 mL | INTRAVENOUS | Status: DC | PRN
  Filled 2017-09-02: qty 250

## 2017-09-02 MED ORDER — SUCCINYLCHOLINE CHLORIDE 200 MG/10ML IV SOSY
PREFILLED_SYRINGE | INTRAVENOUS | Status: AC
  Filled 2017-09-02: qty 10

## 2017-09-02 MED ORDER — PHENYLEPHRINE HCL 10 MG/ML IJ SOLN
INTRAMUSCULAR | Status: AC
  Filled 2017-09-02: qty 1

## 2017-09-02 MED ORDER — FENTANYL CITRATE (PF) 100 MCG/2ML IJ SOLN
INTRAMUSCULAR | Status: DC | PRN
  Administered 2017-09-02: 25 ug via INTRAVENOUS
  Administered 2017-09-02: 50 ug via INTRAVENOUS
  Administered 2017-09-02: 25 ug via INTRAVENOUS

## 2017-09-02 MED ORDER — OXYCODONE HCL 5 MG PO TABS
5.0000 mg | ORAL_TABLET | ORAL | Status: DC | PRN
  Filled 2017-09-02: qty 2

## 2017-09-02 MED ORDER — ACETAMINOPHEN 500 MG PO TABS
ORAL_TABLET | ORAL | Status: AC
  Filled 2017-09-02: qty 2

## 2017-09-02 SURGICAL SUPPLY — 28 items
BAG DRAIN URO-CYSTO SKYTR STRL (DRAIN) ×3 IMPLANT
BAG DRN UROCATH (DRAIN) ×1
BAG URINE LEG 500ML (DRAIN) ×3 IMPLANT
CATH FOLEY 2WAY SLVR  5CC 16FR (CATHETERS)
CATH FOLEY 2WAY SLVR  5CC 20FR (CATHETERS) ×2
CATH FOLEY 2WAY SLVR 5CC 16FR (CATHETERS) IMPLANT
CATH FOLEY 2WAY SLVR 5CC 20FR (CATHETERS) ×1 IMPLANT
CLOTH BEACON ORANGE TIMEOUT ST (SAFETY) ×3 IMPLANT
ELECT REM PT RETURN 9FT ADLT (ELECTROSURGICAL) ×3
ELECTRODE REM PT RTRN 9FT ADLT (ELECTROSURGICAL) ×1 IMPLANT
GLOVE SURG SS PI 8.0 STRL IVOR (GLOVE) ×3 IMPLANT
GOWN STRL REUS W/ TWL LRG LVL3 (GOWN DISPOSABLE) ×1 IMPLANT
GOWN STRL REUS W/ TWL XL LVL3 (GOWN DISPOSABLE) ×1 IMPLANT
GOWN STRL REUS W/TWL LRG LVL3 (GOWN DISPOSABLE) ×3
GOWN STRL REUS W/TWL XL LVL3 (GOWN DISPOSABLE) ×3
KIT TURNOVER CYSTO (KITS) ×3 IMPLANT
LOOP CUT BIPOLAR 24F LRG (ELECTROSURGICAL) ×2 IMPLANT
MANIFOLD NEPTUNE II (INSTRUMENTS) ×2 IMPLANT
NDL SAFETY ECLIPSE 18X1.5 (NEEDLE) IMPLANT
NEEDLE HYPO 18GX1.5 SHARP (NEEDLE)
NEEDLE HYPO 22GX1.5 SAFETY (NEEDLE) IMPLANT
NS IRRIG 500ML POUR BTL (IV SOLUTION) IMPLANT
PACK CYSTO (CUSTOM PROCEDURE TRAY) ×3 IMPLANT
SYR 20CC LL (SYRINGE) IMPLANT
TUBE CONNECTING 12'X1/4 (SUCTIONS) ×1
TUBE CONNECTING 12X1/4 (SUCTIONS) ×1 IMPLANT
WATER STERILE IRR 3000ML UROMA (IV SOLUTION) ×7 IMPLANT
WATER STERILE IRR 500ML POUR (IV SOLUTION) ×2 IMPLANT

## 2017-09-02 NOTE — Op Note (Addendum)
Procedure: Cystoscopy with transurethral resection and fulguration of large bladder lesion.  Preop diagnosis: Left bladder wall lesion with hematuria and pyuria.  Postop diagnosis: Bladder wall lesion of overlapping sites with hematuria and pyuria, probable radiation cystitis versus carcinoma in situ.  Surgeon: Dr. [name].  Anesthesia: General.  Specimen: Transurethral resection biopsies of left lateral wall and bladder neck.  Drain: 35 [place] Foley catheter.  EBL: Minimal.  Complications: None.  Indications: [name] is a 77 year old white male with a history of prostate cancer treated with radiation therapy in 2014.  He has had some issues with hematuria and persistent pyuria in the recent weeks and CT scanning demonstrated a possible lesion on the left bladder wall.  Cystoscopy demonstrated erythema of the bladder neck but primarily the left lateral wall full inspection was difficult due to poor visualization with cystoscopy.  Despite the pyuria 2 urine cultures were negative and there was no evidence of yeast on the urine microscopic exam or culture.  Urine cytology just showed acute inflammatory cells.  Procedure: [name] was taken to the operating room where he was given Ancef.  A general anesthetic was induced and he was placed in lithotomy position.  He was fitted with PAS hose.  His perineum and genitalia were prepped with Betadine solution and draped in usual sterile fashion.  Cystoscopy was performed using the 23 [place] scope and 30 degree lens.  He had a coronal hypospadiac meatus without stenosis.  The urethra was unremarkable.  The external sphincter was intact.  The prostatic urethra was approximately 3 cm in length with bilobar hyperplasia with some obstruction and increased mucosal vascularity particularly in the proximal prostatic urethra.  The bladder wall had moderate trabeculation with small diverticuli particularly on the posterior wall.  The ureteral orifices  were unremarkable.  There was erythema with velvety mucosa that extended from approximately 1:00 to 7:00 primarily on the bladder neck, trigone and left lateral wall for an area of about 2 x 6cm.  This was suspicious for carcinoma in situ versus radiation cystitis.  Initial cystoscopy was difficult due to bleeding from the area of the anterior left lateral wall.  The cystoscope was switched for the 28 French continuous-flow resectoscope sheath with an Tony handle, bipolar loop and 30 degree lens.  Using continuous flow I was able to inspect the bladder as noted above.  The area of bleeding on the left anterior wall was initially fulgurated to aid visualization.  I then took several resection biopsies from the left anterior lateral wall the left bladder neck and the right trigone with generous fulguration of the surrounding abnormal mucosa and resection beds.  The bladder was evacuated free of the specimen and final hemostasis was achieved in the area of resection and initial fulguration was some additional fulguration in the proximal prostate.  The resectoscope was then removed after final inspection revealed no retained tissue no active bleeding and intact ureteral orifice ease.  A 20 French Foley catheter was inserted with the aid of a catheter guide.  The balloon was filled with 10 cc of sterile fluid and the catheter was irrigated with clear return.  He was taken down from the lithotomy position after the catheter was placed to leg bag.  His anesthetic was reversed, he was moved to recovery room in stable condition and there were no complications

## 2017-09-02 NOTE — Transfer of Care (Signed)
Immediate Anesthesia Transfer of Care Note  Patient: [name]  Procedure(s) Performed: CYSTOSCOPY WITH BIOPSY AND FULGURATION POSSIBLE TRANSURETHRAL RESECTION OF BLADDER TUMOR (N/A )  Patient Location: PACU  Anesthesia Type:General  Level of Consciousness: awake, alert , oriented and patient cooperative  Airway & Oxygen Therapy: Patient Spontanous Breathing and Patient connected to nasal cannula oxygen  Post-op Assessment: Report given to RN and Post -op Vital signs reviewed and stable  Post vital signs: Reviewed and stable  Last Vitals:  Vitals:   09/02/17 0538  BP: 125/61  Pulse: 98  Resp: 16  Temp: 37 C  SpO2: 95%    Last Pain:  Vitals:   09/02/17 0538  TempSrc: Oral      Patients Stated Pain Goal: 5 (56/[id number]/94 [id number])  Complications: No apparent anesthesia complications

## 2017-09-02 NOTE — Discharge Instructions (Addendum)
CYSTOSCOPY HOME CARE INSTRUCTIONS  Activity: Rest for the remainder of the day.  Do not drive or operate equipment today.  No lifting over 10lb or vigorous exercise for at least a week.  You may resume normal activities in one to two days as instructed by your physician.   Meals: Drink plenty of liquids and eat light foods such as gelatin or soup this evening.  You may return to a normal meal plan tomorrow.  Return to Work: You may return to work in one to two days or as instructed by your physician.  Special Instructions / Symptoms: Call your physician if any of these symptoms occur:   -persistent or heavy bleeding  -bleeding which continues after first few urination  -large blood clots that are difficult to pass  -urine stream diminishes or stops completely  -fever equal to or higher than 101 degrees Farenheit.  -cloudy urine with a strong, foul odor  -severe pain  You may feel some burning pain when you urinate.  This should disappear with time.  Applying moist heat to the lower abdomen or a hot tub bath may help relieve the pain. \  You may remove the foley in the morning by cutting the side arm off and letting the fluid drain before sliding the catheter out.  If you don't feel comfortable with removing the catheter, please call the office to have it done in the morning.   You may resume the Xarelto in 3 days if there is no active bleeding and if bleeding occurs after resuming the med, please hold the Xarelto.    Post Anesthesia Home Care Instructions  Activity: Get plenty of rest for the remainder of the day. A responsible individual must stay with you for 24 hours following the procedure.  For the next 24 hours, DO NOT: -Drive a car -Paediatric nurse -Drink alcoholic beverages -Take any medication unless instructed by your physician -Make any legal decisions or sign important papers.  Meals: Start with liquid foods such as gelatin or soup. Progress to regular foods as  tolerated. Avoid greasy, spicy, heavy foods. If nausea and/or vomiting occur, drink only clear liquids until the nausea and/or vomiting subsides. Call your physician if vomiting continues.  Special Instructions/Symptoms: Your throat may feel dry or sore from the anesthesia or the breathing tube placed in your throat during surgery. If this causes discomfort, gargle with warm salt water. The discomfort should disappear within 24 hours.  If you had a scopolamine patch placed behind your ear for the management of post- operative nausea and/or vomiting:  1. The medication in the patch is effective for 72 hours, after which it should be removed.  Wrap patch in a tissue and discard in the trash. Wash hands thoroughly with soap and water. 2. You may remove the patch earlier than 72 hours if you experience unpleasant side effects which may include dry mouth, dizziness or visual disturbances. 3. Avoid touching the patch. Wash your hands with soap and water after contact with the patch.

## 2017-09-02 NOTE — Interval H&P Note (Signed)
History and Physical Interval Note:  Cytology and culture remain negative.   09/02/2017 7:18 AM  [name]  has presented today for surgery, with the diagnosis of BLADDER LESION  The various methods of treatment have been discussed with the patient and family. After consideration of risks, benefits and other options for treatment, the patient has consented to  Procedure(s): CYSTOSCOPY WITH BIOPSY AND FULGURATION POSSIBLE TRANSURETHRAL RESECTION OF BLADDER TUMOR (N/A) as a surgical intervention .  The patient's history has been reviewed, patient examined, no change in status, stable for surgery.  I have reviewed the patient's chart and labs.  Questions were answered to the patient's satisfaction.     [name]

## 2017-09-02 NOTE — Anesthesia Postprocedure Evaluation (Signed)
Anesthesia Post Note  Patient: [name]  Procedure(s) Performed: CYSTOSCOPY WITH BIOPSY AND FULGURATION POSSIBLE TRANSURETHRAL RESECTION OF BLADDER TUMOR (N/A )     Patient location during evaluation: PACU Anesthesia Type: General Level of consciousness: awake and alert Pain management: pain level controlled Vital Signs Assessment: post-procedure vital signs reviewed and stable Respiratory status: spontaneous breathing, nonlabored ventilation and respiratory function stable Cardiovascular status: blood pressure returned to baseline and stable Postop Assessment: no apparent nausea or vomiting Anesthetic complications: no    Last Vitals:  Vitals:   09/02/17 0830 09/02/17 0845  BP: 134/65 (!) 145/67  Pulse: 76 78  Resp: 11 11  Temp:    SpO2: 100% 97%    Last Pain:  Vitals:   09/02/17 0538  TempSrc: Oral                 Catalina Gravel

## 2017-09-02 NOTE — Interval H&P Note (Signed)
History and Physical Interval Note:  09/02/2017 7:17 AM  [name]  has presented today for surgery, with the diagnosis of BLADDER LESION  The various methods of treatment have been discussed with the patient and family. After consideration of risks, benefits and other options for treatment, the patient has consented to  Procedure(s): CYSTOSCOPY WITH BIOPSY AND FULGURATION POSSIBLE TRANSURETHRAL RESECTION OF BLADDER TUMOR (N/A) as a surgical intervention .  The patient's history has been reviewed, patient examined, no change in status, stable for surgery.  I have reviewed the patient's chart and labs.  Questions were answered to the patient's satisfaction.     [name]

## 2017-09-02 NOTE — Anesthesia Preprocedure Evaluation (Addendum)
Anesthesia Evaluation  Patient identified by MRN, date of birth, ID band Patient awake    Reviewed: Allergy & Precautions, NPO status , Patient's Chart, lab work & pertinent test results  Airway Mallampati: II  TM Distance: >3 FB Neck ROM: Full    Dental  (+) Dental Advisory Given, Implants Upper and lower implants--full set of teeth :   Pulmonary shortness of breath and with exertion, former smoker, PE (2005)   Pulmonary exam normal breath sounds clear to auscultation       Cardiovascular hypertension, Pt. on medications + CAD, + Peripheral Vascular Disease (abdominal aortic aneurysm) and + DVT  Normal cardiovascular exam+ dysrhythmias (RBBB)  Rhythm:Regular Rate:Normal  Echo 05/01/17: Study Conclusions  - Left ventricle: The cavity size was normal. There was mild concentric hypertrophy. Systolic function was normal. The estimated ejection fraction was in the range of 55% to 60%. Wall motion was normal; there were no regional wall motion abnormalities. Doppler parameters are consistent with abnormal left ventricular relaxation (grade 1 diastolic dysfunction). - Aortic valve: There was trivial regurgitation. - Mitral valve: Systolic bowing without prolapse. There was mild to moderate regurgitation directed centrally. - Left atrium: The atrium was mildly dilated. - Atrial septum: There was an atrial septal aneurysm. - Pulmonary arteries: Systolic pressure was mildly to moderately increased. PA peak pressure: 46 mm Hg (S).   Neuro/Psych S/p evacuation of SDH 2013 negative psych ROS   GI/Hepatic Neg liver ROS, GERD (h/o Barrett's esophagus)  Medicated and Poorly Controlled,  Endo/Other  negative endocrine ROS  Renal/GU negative Renal ROS   Prostate cancer    Musculoskeletal  (+) Arthritis , Osteoarthritis,    Abdominal   Peds  Hematology  (+) Blood dyscrasia (Xarelto), anemia ,   Anesthesia Other Findings Day of  surgery medications reviewed with the patient.  Reproductive/Obstetrics                           Anesthesia Physical Anesthesia Plan  ASA: III  Anesthesia Plan: General   Post-op Pain Management:    Induction: Intravenous  PONV Risk Score and Plan: 3 and Dexamethasone, Ondansetron and Treatment may vary due to age or medical condition  Airway Management Planned: Oral ETT  Additional Equipment:   Intra-op Plan:   Post-operative Plan: Extubation in OR  Informed Consent: I have reviewed the patients History and Physical, chart, labs and discussed the procedure including the risks, benefits and alternatives for the proposed anesthesia with the patient or authorized representative who has indicated his/her understanding and acceptance.   Dental advisory given  Plan Discussed with: CRNA  Anesthesia Plan Comments:         Anesthesia Quick Evaluation

## 2017-09-02 NOTE — Anesthesia Procedure Notes (Signed)
Procedure Name: Intubation Date/Time: 09/02/2017 7:33 AM Performed by: Wanita Chamberlain, CRNA Pre-anesthesia Checklist: Timeout performed, Patient identified, Emergency Drugs available, Suction available and Patient being monitored Patient Re-evaluated:Patient Re-evaluated prior to induction Oxygen Delivery Method: Circle system utilized Preoxygenation: Pre-oxygenation with 100% oxygen Induction Type: IV induction Ventilation: Mask ventilation without difficulty Laryngoscope Size: Mac and 4 Grade View: Grade I Tube type: Oral Number of attempts: 1 Airway Equipment and Method: Patient positioned with wedge pillow Placement Confirmation: ETT inserted through vocal cords under direct vision,  positive ETCO2,  CO2 detector and breath sounds checked- equal and bilateral Secured at: 24 cm Tube secured with: Tape Dental Injury: Teeth and Oropharynx as per pre-operative assessment

## 2017-09-03 ENCOUNTER — Encounter (HOSPITAL_BASED_OUTPATIENT_CLINIC_OR_DEPARTMENT_OTHER): Payer: Self-pay | Admitting: Urology

## 2017-09-17 DIAGNOSIS — N3040 Irradiation cystitis without hematuria: Secondary | ICD-10-CM | POA: Diagnosis not present

## 2017-10-23 ENCOUNTER — Observation Stay (HOSPITAL_COMMUNITY)
Admission: EM | Admit: 2017-10-23 | Discharge: 2017-10-27 | Disposition: A | Discharge status: Home Health | Payer: PPO | Attending: Internal Medicine | Admitting: Internal Medicine

## 2017-10-23 ENCOUNTER — Emergency Department (HOSPITAL_COMMUNITY): Payer: PPO

## 2017-10-23 ENCOUNTER — Encounter (HOSPITAL_COMMUNITY): Payer: Self-pay

## 2017-10-23 ENCOUNTER — Other Ambulatory Visit: Payer: Self-pay

## 2017-10-23 DIAGNOSIS — K219 Gastro-esophageal reflux disease without esophagitis: Secondary | ICD-10-CM | POA: Diagnosis not present

## 2017-10-23 DIAGNOSIS — D649 Anemia, unspecified: Secondary | ICD-10-CM

## 2017-10-23 DIAGNOSIS — I959 Hypotension, unspecified: Secondary | ICD-10-CM | POA: Diagnosis not present

## 2017-10-23 DIAGNOSIS — I110 Hypertensive heart disease with heart failure: Secondary | ICD-10-CM | POA: Insufficient documentation

## 2017-10-23 DIAGNOSIS — Z7901 Long term (current) use of anticoagulants: Secondary | ICD-10-CM | POA: Diagnosis not present

## 2017-10-23 DIAGNOSIS — Z79899 Other long term (current) drug therapy: Secondary | ICD-10-CM | POA: Insufficient documentation

## 2017-10-23 DIAGNOSIS — Z923 Personal history of irradiation: Secondary | ICD-10-CM | POA: Diagnosis not present

## 2017-10-23 DIAGNOSIS — I2699 Other pulmonary embolism without acute cor pulmonale: Secondary | ICD-10-CM | POA: Diagnosis present

## 2017-10-23 DIAGNOSIS — I714 Abdominal aortic aneurysm, without rupture: Secondary | ICD-10-CM | POA: Insufficient documentation

## 2017-10-23 DIAGNOSIS — E872 Acidosis, unspecified: Secondary | ICD-10-CM

## 2017-10-23 DIAGNOSIS — R55 Syncope and collapse: Secondary | ICD-10-CM | POA: Diagnosis not present

## 2017-10-23 DIAGNOSIS — R110 Nausea: Secondary | ICD-10-CM

## 2017-10-23 DIAGNOSIS — Z87891 Personal history of nicotine dependence: Secondary | ICD-10-CM | POA: Diagnosis not present

## 2017-10-23 DIAGNOSIS — D509 Iron deficiency anemia, unspecified: Secondary | ICD-10-CM | POA: Diagnosis not present

## 2017-10-23 DIAGNOSIS — Z86711 Personal history of pulmonary embolism: Secondary | ICD-10-CM | POA: Diagnosis not present

## 2017-10-23 DIAGNOSIS — Z86718 Personal history of other venous thrombosis and embolism: Secondary | ICD-10-CM | POA: Insufficient documentation

## 2017-10-23 DIAGNOSIS — I2510 Atherosclerotic heart disease of native coronary artery without angina pectoris: Secondary | ICD-10-CM | POA: Diagnosis not present

## 2017-10-23 DIAGNOSIS — Z951 Presence of aortocoronary bypass graft: Secondary | ICD-10-CM | POA: Insufficient documentation

## 2017-10-23 DIAGNOSIS — R531 Weakness: Secondary | ICD-10-CM | POA: Diagnosis not present

## 2017-10-23 DIAGNOSIS — N390 Urinary tract infection, site not specified: Secondary | ICD-10-CM | POA: Insufficient documentation

## 2017-10-23 DIAGNOSIS — Z8546 Personal history of malignant neoplasm of prostate: Secondary | ICD-10-CM | POA: Insufficient documentation

## 2017-10-23 DIAGNOSIS — I10 Essential (primary) hypertension: Secondary | ICD-10-CM | POA: Diagnosis present

## 2017-10-23 DIAGNOSIS — I5032 Chronic diastolic (congestive) heart failure: Secondary | ICD-10-CM | POA: Diagnosis not present

## 2017-10-23 DIAGNOSIS — R42 Dizziness and giddiness: Secondary | ICD-10-CM | POA: Insufficient documentation

## 2017-10-23 DIAGNOSIS — J849 Interstitial pulmonary disease, unspecified: Secondary | ICD-10-CM | POA: Diagnosis not present

## 2017-10-23 DIAGNOSIS — K2270 Barrett's esophagus without dysplasia: Secondary | ICD-10-CM | POA: Diagnosis not present

## 2017-10-23 DIAGNOSIS — M109 Gout, unspecified: Secondary | ICD-10-CM | POA: Diagnosis present

## 2017-10-23 DIAGNOSIS — R031 Nonspecific low blood-pressure reading: Secondary | ICD-10-CM | POA: Diagnosis not present

## 2017-10-23 LAB — COMPREHENSIVE METABOLIC PANEL
ALBUMIN: 3.5 g/dL (ref 3.5–5.0)
ALT: 10 U/L — ABNORMAL LOW (ref 17–63)
AST: 21 U/L (ref 15–41)
Alkaline Phosphatase: 44 U/L (ref 38–126)
Anion gap: 12 (ref 5–15)
BUN: 20 mg/dL (ref 6–20)
CO2: 18 mmol/L — AB (ref 22–32)
Calcium: 8 mg/dL — ABNORMAL LOW (ref 8.9–10.3)
Chloride: 106 mmol/L (ref 101–111)
Creatinine, Ser: 1.03 mg/dL (ref 0.61–1.24)
GFR calc Af Amer: >60 mL/min (ref >60)
GFR calc non Af Amer: >60 mL/min (ref >60)
GLUCOSE: 101 mg/dL — AB (ref 65–99)
POTASSIUM: 4.6 mmol/L (ref 3.5–5.1)
Sodium: 136 mmol/L (ref 135–145)
Total Bilirubin: 0.5 mg/dL (ref 0.3–1.2)
Total Protein: 6.1 g/dL — ABNORMAL LOW (ref 6.5–8.1)

## 2017-10-23 LAB — URINALYSIS, ROUTINE W REFLEX MICROSCOPIC
BILIRUBIN URINE: NEGATIVE
Glucose, UA: NEGATIVE mg/dL
KETONES UR: NEGATIVE mg/dL
Nitrite: NEGATIVE
PROTEIN: NEGATIVE mg/dL
Specific Gravity, Urine: 1.012 (ref 1.005–1.030)
pH: 6 (ref 5.0–8.0)

## 2017-10-23 LAB — CBC WITH DIFFERENTIAL/PLATELET
BASOS ABS: 0 10*3/uL (ref 0.0–0.1)
Basophils Relative: 0 %
Eosinophils Absolute: 0.1 10*3/uL (ref 0.0–0.7)
Eosinophils Relative: 2 %
HCT: 26.2 % — ABNORMAL LOW (ref 39.0–52.0)
HEMOGLOBIN: 7.8 g/dL — AB (ref 13.0–17.0)
LYMPHS ABS: 0.7 10*3/uL (ref 0.7–4.0)
LYMPHS PCT: 9 %
MCH: 22 pg — ABNORMAL LOW (ref 26.0–34.0)
MCHC: 29.8 g/dL — ABNORMAL LOW (ref 30.0–36.0)
MCV: 74 fL — ABNORMAL LOW (ref 78.0–100.0)
Monocytes Absolute: 0.6 10*3/uL (ref 0.1–1.0)
Monocytes Relative: 8 %
Neutro Abs: 6 10*3/uL (ref 1.7–7.7)
Neutrophils Relative %: 81 %
Platelets: 253 10*3/uL (ref 150–400)
RBC: 3.54 MIL/uL — AB (ref 4.22–5.81)
RDW: 17.9 % — AB (ref 11.5–15.5)
WBC: 7.4 10*3/uL (ref 4.0–10.5)

## 2017-10-23 LAB — LIPASE, BLOOD: LIPASE: 25 U/L (ref 11–51)

## 2017-10-23 LAB — D-DIMER, QUANTITATIVE: D-Dimer, Quant: 9.11 ug/mL-FEU — ABNORMAL HIGH (ref 0.00–0.50)

## 2017-10-23 LAB — LACTIC ACID, PLASMA: LACTIC ACID, VENOUS: 3 mmol/L — AB (ref 0.5–1.9)

## 2017-10-23 MED ORDER — ONDANSETRON HCL 4 MG/2ML IJ SOLN: 4.0000 mg | Freq: Four times a day (QID) | INTRAMUSCULAR | Status: DC | PRN

## 2017-10-23 MED ORDER — SODIUM CHLORIDE 0.9% FLUSH
3.0000 mL | Freq: Two times a day (BID) | INTRAVENOUS | Status: DC
  Administered 2017-10-24 – 2017-10-27 (×5): 3 mL via INTRAVENOUS

## 2017-10-23 MED ORDER — ACETAMINOPHEN 325 MG PO TABS: 650.0000 mg | ORAL_TABLET | Freq: Four times a day (QID) | ORAL | Status: DC | PRN

## 2017-10-23 MED ORDER — SODIUM CHLORIDE 0.9 % IV SOLN
INTRAVENOUS | Status: DC
  Administered 2017-10-23 – 2017-10-24 (×2): via INTRAVENOUS
  Administered 2017-10-25: 75 mL/h via INTRAVENOUS
  Administered 2017-10-26: 03:00:00 via INTRAVENOUS

## 2017-10-23 MED ORDER — ACETAMINOPHEN 650 MG RE SUPP: 650.0000 mg | Freq: Four times a day (QID) | RECTAL | Status: DC | PRN

## 2017-10-23 MED ORDER — ONDANSETRON HCL 4 MG PO TABS: 4.0000 mg | ORAL_TABLET | Freq: Four times a day (QID) | ORAL | Status: DC | PRN

## 2017-10-23 NOTE — H&P (Signed)
4        History and Physical    [name] QAS:[id number] DOB: 05-31-1941 DOA: 10/23/2017  PCP: Deland Pretty, MD   Patient coming from: Home.  I have personally briefly reviewed patient's old medical records in [place]  Chief Complaint: Fall.  HPI: [name] is a 77 y.o. male with medical history significant of AAA, CAD/history of CABG in January of this year, RBBB, hypertension, common iliac aneurysm, pulmonary hypertension, history of PEs and DVTs on Xarelto, osteoarthritis, GERD, Barrett's esophagus, history of esophageal stricture, colon polyps, diverticulosis, gout, chronic lower back pain, history of prostate cancer, history of subdural hematoma, S/P evacuation of [facility] who was brought to the ED via EMS after having a fall at home.  Per patient, he was seeding in his stool for a long while.  His leg started to feel tingly.  So he tried to stand up, but states that he did not feel his leg and fell.  He denies dyspnea, chest pain, palpitations, orthopnea, PND, but states that he has been having postural dizziness and has been having lower extremity edema for a while.  He complains he has chronic fatigue and mentions that he was told that once he had a CABG, he is felt that he was going to go away.  However, he still feels fatigued.  He denies abdominal pain, nausea, emesis, diarrhea, melena or hematochezia, but complains of dyspepsia.  No dysuria, frequency or hematuria.  No pruritus.  No heat or cold intolerance.  No polyuria, polydipsia, polyphagia or blurred vision.  ED Course: Initial vital signs temperature 97.9 F, pulse 72, respirations 16, blood pressure 125/79 mmHg and O2 sat 97% on room air.  No medications to my knowledge in the ED.  He is white count was 7.4 with 81% neutrophils, 9% lymphocytes and 8% monocytes.  Hemoglobin 7.8 g/dL.  Hemoglobin had a baseline of 9.5-10+ grams per deciliter prior to CABG.  MCV is 74.0 fL.  Platelets are 253.D-dimer was 9.11 mcg/mL.   Sodium 136, potassium 4.6, chloride 106, CO2 18 and lactic acid 3.0 mmol/L.  Glucose 101, BUN 20, creatinine 1.03, calcium 8.0 mg/dL.  Troponin x1 was negative.  EKG shows known RBBB and LAFB.  There were no ischemic changes.  His chest radiograph shows atelectasis versus early infiltrate.  Please see images and full radiology report for further detail.  Review of Systems: As per HPI otherwise 10 point review of systems negative.    Past Medical History:  Diagnosis Date  . AAA (abdominal aortic aneurysm) ([place]) followed by dr Stanford Breed   last duplex 07-17-2017  3.2 x 3.3  . Anticoagulant long-term use    xarelto for hx PEs and DVT  . Arthritis    back  . Barrett's esophagus 2009  . Bilateral lower extremity edema    chronic pedal  . CAD (coronary artery disease) cardioloigst--  dr Stanford Breed   cardiac cath 04-12-2014  single vessel obstructive cad involving ostrium D2, and other nonobstructive cad , normall lvf  . Chronic lower back pain   . 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[place])    per duplex 07-17-2017  left cia 2 x 2, right cia 2.1 x 2  . Diverticulosis   . Dyspnea on exertion   . FH: colonic polyps   . GERD (gastroesophageal reflux disease)   . Gout    08-27-2017  per pt stable  . History of adenomatous polyp of colon   . History of DVT of lower extremity  10/ 2015  LLE  . History of esophageal stricture    post dilation  . History of gastric polyp    benign  . History of pulmonary embolus (PE)    2005  treated 1 yr w/ coumadin and 10/ 2015  bilateral  . History of radiation therapy 06/22/2012-08/18/2012   78 Gy to prostate (external beam)  . History of subdural hematoma 03/2012   s/p  evacuation  . Hypertension   . Lesion of bladder   . Prostate cancer [facility]) urologist-  dr wrenn/ oncologist- dr Sondra Come   dx 07/ 2013--  Stage T1c, [place] 7, vol 67cc--- completed external beam radiation therapy 08-18-2012  . Pulmonary HTN ([place]) 04/12/2014   last echo 05-01-2017  PASP 51mmHg  . RBBB  (right bundle branch block with left anterior fascicular block)     Past Surgical History:  Procedure Laterality Date  . CATARACT EXTRACTION W/ INTRAOCULAR LENS  IMPLANT, BILATERAL  2018  . COLONOSCOPY WITH PROPOFOL N/A 04/25/2015   Procedure: COLONOSCOPY WITH PROPOFOL;  Surgeon: Manus Gunning, MD;  Location: [facility];  Service: Gastroenterology;  Laterality: N/A;  . CRANIOTOMY  04/03/2012   Procedure: CRANIOTOMY HEMATOMA EVACUATION SUBDURAL;  Surgeon: Erline Levine, MD;  Location: [place] [facility]UR[facility] ORS;  Service: Neurosurgery;  Laterality: Left;  Left Craniotomy for Evacuation of Subdural Hematoma  . CYSTOSCOPY WITH BIOPSY N/A 09/02/2017   Procedure: CYSTOSCOPY WITH BIOPSY AND FULGURATION POSSIBLE TRANSURETHRAL RESECTION OF BLADDER TUMOR;  Surgeon: [name], MD;  Location: [facility];  Service: Urology;  Laterality: N/A;  . ESOPHAGOGASTRODUODENOSCOPY (EGD) WITH PROPOFOL N/A 04/25/2015   Procedure: ESOPHAGOGASTRODUODENOSCOPY (EGD) WITH PROPOFOL;  Surgeon: Manus Gunning, MD;  Location: [facility];  Service: Gastroenterology;  Laterality: N/A;  . IRRIGATION AND DEBRIDEMENT SEBACEOUS CYST  01-11-13   "off my back"  . LEFT AND RIGHT HEART CATHETERIZATION WITH CORONARY ANGIOGRAM N/A 04/12/2014   Procedure: LEFT AND RIGHT HEART CATHETERIZATION WITH CORONARY ANGIOGRAM;  Surgeon: Peter M Martinique, MD;  Location: [facility] CATH LAB;  Service: Cardiovascular;  Laterality: N/A;  . ORIF FEMUR FRACTURE Left 10-02-2010   dr Alvan Dame   subtrochanteric  . TONSILLECTOMY  1940's  . TRANSTHORACIC ECHOCARDIOGRAM  05/01/2017   mild concentric LVH, ef 16-10%, grade 1 diastolic dysfunction/ trivial AR/ mild dilated ascending aorta/ mild to moderate MR (no stenosis)/ mild LAE/ mild TR/ mild to moderate increase PASP 32mmHg     reports that he quit smoking about 45 years ago. His smoking use included cigarettes. He has a 20.00 pack-year smoking history. He has never used smokeless tobacco. He reports  that he drinks about 8.4 oz of alcohol per week. He reports that he does not use drugs.  No Known Allergies  Family History  Problem Relation Age of Onset  . Macular degeneration Mother   . Osteoarthritis Mother   . Aneurysm Father        d/o 6 yo, ruptured AAA  . Heart disease Unknown        No family history  . Colon cancer Neg Hx   . Colon polyps Neg Hx   . Diabetes Neg Hx     Prior to Admission medications   Medication Sig Start Date End Date Taking? Authorizing Provider  allopurinol (ZYLOPRIM) 300 MG tablet Take 300 mg by mouth every morning.    Yes [provider]  celecoxib (CELEBREX) 200 MG capsule Take 1 capsule by mouth 2 (two) times daily as needed for moderate pain.  02/24/15  Yes [provider]  DULoxetine (CYMBALTA) 30 MG capsule Take 30 mg by mouth every morning.  04/09/16  Yes [provider]  lisinopril (PRINIVIL,ZESTRIL) 20 MG tablet Take 1 tablet (20 mg total) by mouth daily. Patient taking differently: Take 20 mg by mouth every morning.  04/23/17  Yes Lelon Perla, MD  ranitidine (ZANTAC) 150 MG tablet Take 150 mg by mouth 2 (two) times daily.    Yes [provider]  XARELTO 20 MG TABS tablet TAKE 1 TABLET BY MOUTH EVERY DAY WITH SUPPER Patient taking differently: TAKE 1 TABLET (20mg ) BY MOUTH EVERY DAY WITH SUPPER 09/02/17  Yes Crenshaw, Denice Bors, MD  traMADol (ULTRAM) 50 MG tablet Take 1 tablet (50 mg total) by mouth every 6 (six) hours as needed for moderate pain. Patient not taking: Reported on 10/23/2017 09/02/17 09/02/18  [name], MD    Physical Exam: Vitals:   10/23/17 2200 10/23/17 2245 10/23/17 2315 10/23/17 2330  BP: (!) 150/83 (!) 151/87 (!) 150/88 (!) 145/84  Pulse: 84 85 85 81  Resp: 16 20 18 12   Temp:      TempSrc:      SpO2: 97% 96% 98% 99%  Weight:      Height:        Constitutional: NAD, calm, comfortable Eyes: PERRL, lids and conjunctivae are pale. ENMT: Mucous membranes are moist. Posterior  pharynx clear of any exudate or lesions. Neck: normal, supple, no masses, no thyromegaly Respiratory: clear to auscultation bilaterally, no wheezing, no crackles. Normal respiratory effort. No accessory muscle use.  Cardiovascular: Regular rate and rhythm, no murmurs / rubs / gallops.  2+ lower extremities pitting edema L>R  (History of hip fracture on the left). 2+ pedal pulses. No carotid bruits.  Abdomen: Soft, no tenderness, no masses palpated. No hepatosplenomegaly. Bowel sounds positive.  Musculoskeletal: no clubbing / cyanosis.  Good ROM, no contractures. Normal muscle tone.  Skin: Areas of ecchymosis on extremities. Neurologic: CN 2-12 grossly intact. Sensation intact, DTR normal. Strength 5/5 in all 4.  Psychiatric: Normal judgment and insight. Alert and oriented x 4. Normal mood.    Labs on Admission: I have personally reviewed following labs and imaging studies  CBC: Recent Labs  Lab 10/23/17 2110  WBC 7.4  NEUTROABS 6.0  HGB 7.8*  HCT 26.2*  MCV 74.0*  PLT 440   Basic Metabolic Panel: Recent Labs  Lab 10/23/17 2110  NA 136  K 4.6  CL 106  CO2 18*  GLUCOSE 101*  BUN 20  CREATININE 1.03  CALCIUM 8.0*   GFR: Estimated Creatinine Clearance: 74.7 mL/min (by C-G formula based on SCr of 1.03 mg/dL). Liver Function Tests: Recent Labs  Lab 10/23/17 2110  AST 21  ALT 10*  ALKPHOS 44  BILITOT 0.5  PROT 6.1*  ALBUMIN 3.5   Recent Labs  Lab 10/23/17 2110  LIPASE 25   No results for input(s): AMMONIA in the last 168 hours. Coagulation Profile: No results for input(s): INR, PROTIME in the last 168 hours. Cardiac Enzymes: No results for input(s): CKTOTAL, CKMB, CKMBINDEX, TROPONINI in the last 168 hours. BNP (last 3 results) Recent Labs    05/06/17 1146  PROBNP 122   HbA1C: No results for input(s): HGBA1C in the last 72 hours. CBG: No results for input(s): GLUCAP in the last 168 hours. Lipid Profile: No results for input(s): CHOL, HDL, LDLCALC,  TRIG, CHOLHDL, LDLDIRECT in the last 72 hours. Thyroid Function Tests: No results for input(s): TSH, T4TOTAL, FREET4, T3FREE, THYROIDAB in  the last 72 hours. Anemia Panel: No results for input(s): VITAMINB12, FOLATE, FERRITIN, TIBC, IRON, RETICCTPCT in the last 72 hours. Urine analysis:    Component Value Date/Time   COLORURINE YELLOW 10/23/2017 [place] 10/23/2017 2221   LABSPEC 1.012 10/23/2017 2221   PHURINE 6.0 10/23/2017 2221   GLUCOSEU NEGATIVE 10/23/2017 2221   HGBUR SMALL (A) 10/23/2017 2221   BILIRUBINUR NEGATIVE 10/23/2017 2221   KETONESUR NEGATIVE 10/23/2017 2221   PROTEINUR NEGATIVE 10/23/2017 2221   UROBILINOGEN 1.0 05/26/2014 1734   NITRITE NEGATIVE 10/23/2017 2221   LEUKOCYTESUR MODERATE (A) 10/23/2017 2221    Radiological Exams on Admission: Dg Chest 2 View  Result Date: 10/23/2017 CLINICAL DATA:  Hypotensive EXAM: CHEST - 2 VIEW COMPARISON:  05/26/2014 FINDINGS: Patient rotation limits the exam. There are bibasilar opacities. No pleural effusion. Heart size within normal limits. Aortic atherosclerosis. Right paratracheal opacity, likely prominent vascular shadow. No pneumothorax. Aortic atherosclerosis. Stable lower thoracic compression. IMPRESSION: 1. Airspace disease at the bilateral lung bases, may reflect atelectasis, pneumonia or aspiration 2. Right paratracheal opacity, suspected to represent prominent vascular shadow, augmented by marked patient rotation. Electronically Signed   By: Donavan Foil M.D.   On: 10/23/2017 20:57    EKG: Independently reviewed.  Vent. rate 72 BPM PR interval * ms QRS duration 168 ms QT/QTc 456/500 ms P-R-T axes 57 -67 37 Sinus rhythm RBBB and LAFB  Assessment/Plan Principal Problem:   Near syncope From the HPI, looks more like an accidental fall. His EKG does not show any acute changes. However, will check troponin level. Check echocardiogram and carotid Doppler. Anemia work-up.  Active Problems:    Anemia I believe that this is the reason for the patient's fatigue and postural lightheadedness. Check anemia panel. Monitor hematocrit and hemoglobin. Most likely will need iron supplementation. There is concern for occult GI bleed. The patient stated that he will follow-up with his gastroenterologist    Gout Continue allopurinol 300 mg every morning.    GERD   BARRETTS ESOPHAGUS Ranitidine or formulary equivalent p.o. twice daily    Hypertension Hold for now lisinopril 20 mg p.o. daily. Monitor blood pressure, renal function and electrolytes. May need to decrease dosage or continue to hold until anemia improves.    CAD (coronary artery disease) On Xarelto on ACE inhibitor.    Bilateral pulmonary embolism (HCC) Continue Xarelto.    Elevated D-dimer Unsure what could be elevating the patient's d-dimer. On Xarelto.    DVT prophylaxis: On Xarelto. Code Status: Full code. Family Communication:  Disposition Plan: Observation for near syncope and symptomatic anemia work-up. Consults called: Admission status: Observation/telemetry.   Reubin Milan MD [facility] Pager [phone].  If 7PM-7AM, please contact night-coverage [email or web address] Password [facility]  10/23/2017, 11:39 PM

## 2017-10-23 NOTE — ED Triage Notes (Signed)
Pt arrives to ED from home because pt was sitting and his left leg started to feel tingly, so he tried to stand up and shake it off at which point pt fell. Did not hit head, no LOC. EMS reports pt was still on the floor when they got there, pt reports 2 weeks of constipation and was hypotensive 63/37 upon EMS arrival. EMS gave 700 NS and pt's BP 139/61 upon arrival after fluid admin. Pt placed in position of comfort with bed locked and lowered, call bell in reach. CBG 116

## 2017-10-23 NOTE — ED Provider Notes (Addendum)
[facility] EMERGENCY DEPARTMENT Provider Note   CSN: [id number] Arrival date & time: 10/23/17  1830     History   Chief Complaint Chief Complaint  Patient presents with  . Fall  . Hypotension    HPI [name] is a 77 y.o. male.   Patient brought by EMS patient noted at home to be significantly hypotension with multiple checks systolic pressures in the 60s.  Patient had been to the dentist earlier in the day had his dentures fitted had no procedures no injections.  Did not feel well after he got home little bit nauseated no encounter fatigue he just thought it was due to his age he said he has some good days and bad days at around 5:00 or little before he stood upset his leg was asleep from sitting on a stool and he fell over.  His wife said he did not look very good and then EMS had the low blood pressures they gave him 700 cc normal saline by time he got here pressures were up to 139/61.  Blood sugar at home was 116.  Patient without chest pain without shortness of breath just some nausea no abdominal pain.  No headache did not hurt anything with the fall leg is now not numb or asleep but so back to normal.  Patient had a history of PEs in the past and is currently on blood thinners.     Past Medical History:  Diagnosis Date  . AAA (abdominal aortic aneurysm) ([place]) followed by dr Stanford Breed   last duplex 07-17-2017  3.2 x 3.3  . Anticoagulant long-term use    xarelto for hx PEs and DVT  . Arthritis    back  . Barrett's esophagus 2009  . Bilateral lower extremity edema    chronic pedal  . CAD (coronary artery disease) cardioloigst--  dr Stanford Breed   cardiac cath 04-12-2014  single vessel obstructive cad involving ostrium D2, and other nonobstructive cad , normall lvf  . Chronic lower back pain   . 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[place])    per duplex 07-17-2017  left cia 2 x 2, right cia 2.1 x 2  . Diverticulosis   . Dyspnea on exertion   . FH: colonic polyps   .  GERD (gastroesophageal reflux disease)   . Gout    08-27-2017  per pt stable  . History of adenomatous polyp of colon   . History of DVT of lower extremity    10/ 2015  LLE  . History of esophageal stricture    post dilation  . History of gastric polyp    benign  . History of pulmonary embolus (PE)    2005  treated 1 yr w/ coumadin and 10/ 2015  bilateral  . History of radiation therapy 06/22/2012-08/18/2012   78 Gy to prostate (external beam)  . History of subdural hematoma 03/2012   s/p  evacuation  . Hypertension   . Lesion of bladder   . Prostate cancer [facility]) urologist-  dr wrenn/ oncologist- dr Sondra Come   dx 07/ 2013--  Stage T1c, [place] 7, vol 67cc--- completed external beam radiation therapy 08-18-2012  . Pulmonary HTN ([place]) 04/12/2014   last echo 05-01-2017  PASP 69mmHg  . RBBB (right bundle branch block with left anterior fascicular block)     Patient Active Problem List   Diagnosis Date Noted  . Near syncope 10/23/2017  . History of colonic polyps   . Diarrhea 07/01/2014  . Rectal bleeding 06/28/2014  .  Positive blood cultures 05/26/2014  . Bacteremia due to Gram-positive bacteria 05/26/2014  . Leukopenia 05/26/2014  . Neutropenia ([place]) 05/26/2014  . DVT, lower extremity, distal, acute ([place]) 04/19/2014  . Bilateral pulmonary embolism ([place]) 04/14/2014  . Acute pulmonary embolism ([place]) 04/14/2014  . Pulmonary HTN ([place]) 04/12/2014  . Infected sebaceous cyst back 7x5x4cm s/p I&D [id number] 01/11/2013  . Abdominal aortic aneurysm ([place]) 05/25/2012  . CAD (coronary artery disease) 05/25/2012  . Dyspnea 03/30/2012  . Hypertension 03/30/2012  . Bruit 03/30/2012  . Prostate cancer ([place]) 02/03/2012  . GERD 11/26/2007  . ADENOCARCINOMA, COLON 11/25/2007  . Gout 11/25/2007  . ESOPHAGEAL STRICTURE 11/25/2007  . BARRETTS ESOPHAGUS 11/25/2007  . DUODENITIS, WITHOUT HEMORRHAGE 11/25/2007  . DIVERTICULOSIS OF COLON 11/25/2007  . ABDOMINAL PAIN, EPIGASTRIC 11/25/2007  .  FOREIGN BODY IN ESOPHAGUS 11/25/2007  . COLONIC POLYPS, ADENOMATOUS, HX OF 11/25/2007    Past Surgical History:  Procedure Laterality Date  . CATARACT EXTRACTION W/ INTRAOCULAR LENS  IMPLANT, BILATERAL  2018  . COLONOSCOPY WITH PROPOFOL N/A 04/25/2015   Procedure: COLONOSCOPY WITH PROPOFOL;  Surgeon: Manus Gunning, MD;  Location: [facility];  Service: Gastroenterology;  Laterality: N/A;  . CRANIOTOMY  04/03/2012   Procedure: CRANIOTOMY HEMATOMA EVACUATION SUBDURAL;  Surgeon: Erline Levine, MD;  Location: [facility]UR[facility];  Service: Neurosurgery;  Laterality: Left;  Left Craniotomy for Evacuation of Subdural Hematoma  . CYSTOSCOPY WITH BIOPSY N/A 09/02/2017   Procedure: CYSTOSCOPY WITH BIOPSY AND FULGURATION POSSIBLE TRANSURETHRAL RESECTION OF BLADDER TUMOR;  Surgeon: [name], MD;  Location: [facility];  Service: Urology;  Laterality: N/A;  . ESOPHAGOGASTRODUODENOSCOPY (EGD) WITH PROPOFOL N/A 04/25/2015   Procedure: ESOPHAGOGASTRODUODENOSCOPY (EGD) WITH PROPOFOL;  Surgeon: Manus Gunning, MD;  Location: [facility];  Service: Gastroenterology;  Laterality: N/A;  . IRRIGATION AND DEBRIDEMENT SEBACEOUS CYST  01-11-13   "off my back"  . LEFT AND RIGHT HEART CATHETERIZATION WITH CORONARY ANGIOGRAM N/A 04/12/2014   Procedure: LEFT AND RIGHT HEART CATHETERIZATION WITH CORONARY ANGIOGRAM;  Surgeon: Peter M Martinique, MD;  Location: [facility] CATH LAB;  Service: Cardiovascular;  Laterality: N/A;  . ORIF FEMUR FRACTURE Left 10-02-2010   dr Alvan Dame   subtrochanteric  . TONSILLECTOMY  1940's  . TRANSTHORACIC ECHOCARDIOGRAM  05/01/2017   mild concentric LVH, ef 47-65%, grade 1 diastolic dysfunction/ trivial AR/ mild dilated ascending aorta/ mild to moderate MR (no stenosis)/ mild LAE/ mild TR/ mild to moderate increase PASP 16mmHg        Home Medications    Prior to Admission medications   Medication Sig Start Date End Date Taking? Authorizing Provider  allopurinol (ZYLOPRIM)  300 MG tablet Take 300 mg by mouth every morning.    Yes [provider]  celecoxib (CELEBREX) 200 MG capsule Take 1 capsule by mouth 2 (two) times daily as needed for moderate pain.  02/24/15  Yes [provider]  DULoxetine (CYMBALTA) 30 MG capsule Take 30 mg by mouth every morning.  04/09/16  Yes [provider]  lisinopril (PRINIVIL,ZESTRIL) 20 MG tablet Take 1 tablet (20 mg total) by mouth daily. Patient taking differently: Take 20 mg by mouth every morning.  04/23/17  Yes Lelon Perla, MD  ranitidine (ZANTAC) 150 MG tablet Take 150 mg by mouth 2 (two) times daily.    Yes [provider]  XARELTO 20 MG TABS tablet TAKE 1 TABLET BY MOUTH EVERY DAY WITH SUPPER Patient taking differently: TAKE 1 TABLET (20mg ) BY MOUTH EVERY DAY WITH SUPPER 09/02/17  Yes Crenshaw,  Denice Bors, MD  traMADol (ULTRAM) 50 MG tablet Take 1 tablet (50 mg total) by mouth every 6 (six) hours as needed for moderate pain. Patient not taking: Reported on 10/23/2017 09/02/17 09/02/18  [name], MD    Family History Family History  Problem Relation Age of Onset  . Macular degeneration Mother   . Osteoarthritis Mother   . Aneurysm Father        d/o 45 yo, ruptured AAA  . Heart disease Unknown        No family history  . Colon cancer Neg Hx   . Colon polyps Neg Hx   . Diabetes Neg Hx     Social History Social History   Tobacco Use  . Smoking status: Former Smoker    Packs/day: 1.00    Years: 20.00    Pack years: 20.00    Types: Cigarettes    Last attempt to quit: 12/23/1971    Years since quitting: 45.8  . Smokeless tobacco: Never Used  Substance Use Topics  . Alcohol use: Yes    Alcohol/week: 8.4 oz    Types: 14 Cans of beer per week    Comment: 2 beer daily  . Drug use: No     Allergies   Patient has no known allergies.   Review of Systems Review of Systems  Constitutional: Negative for fever.  HENT: Negative for congestion.   Eyes: Negative for photophobia  and visual disturbance.  Respiratory: Negative for shortness of breath.   Cardiovascular: Positive for leg swelling. Negative for chest pain.  Gastrointestinal: Positive for nausea. Negative for abdominal pain.  Genitourinary: Negative for dysuria.  Musculoskeletal: Negative for back pain.  Skin: Negative for rash.  Neurological: Positive for numbness. Negative for dizziness, syncope and headaches.  Hematological: Bruises/bleeds easily.  Psychiatric/Behavioral: Negative for confusion.     Physical Exam Updated Vital Signs BP (!) 151/87   Pulse 85   Temp 97.9 F (36.6 C) (Oral)   Resp 20   Ht 1.829 m (6')   Wt 107 kg (236 lb)   SpO2 96%   BMI 32.01 kg/m   Physical Exam  Constitutional: He is oriented to person, place, and time. He appears well-developed and well-nourished. No distress.  HENT:  Head: Normocephalic and atraumatic.  Mouth/Throat: Oropharynx is clear and moist.  Eyes: Pupils are equal, round, and reactive to light. Conjunctivae and EOM are normal.  Neck: Normal range of motion. Neck supple.  Cardiovascular: Normal rate, regular rhythm and normal heart sounds.  Pulmonary/Chest: Effort normal and breath sounds normal.  Abdominal: Soft. Bowel sounds are normal. There is no tenderness.  Musculoskeletal: Normal range of motion. He exhibits edema.  Neurological: He is alert and oriented to person, place, and time. No cranial nerve deficit or sensory deficit. He exhibits normal muscle tone. Coordination normal.  Skin: Skin is warm. No rash noted.  Nursing note and vitals reviewed.    ED Treatments / Results  Labs (all labs ordered are listed, but only abnormal results are displayed) Labs Reviewed  CBC WITH DIFFERENTIAL/PLATELET - Abnormal; Notable for the following components:      Result Value   RBC 3.54 (*)    Hemoglobin 7.8 (*)    HCT 26.2 (*)    MCV 74.0 (*)    MCH 22.0 (*)    MCHC 29.8 (*)    RDW 17.9 (*)    All other components within normal limits    COMPREHENSIVE METABOLIC PANEL - Abnormal; Notable for the following  components:   CO2 18 (*)    Glucose, Bld 101 (*)    Calcium 8.0 (*)    Total Protein 6.1 (*)    ALT 10 (*)    All other components within normal limits  URINALYSIS, ROUTINE W REFLEX MICROSCOPIC - Abnormal; Notable for the following components:   Hgb urine dipstick SMALL (*)    Leukocytes, UA MODERATE (*)    Bacteria, UA RARE (*)    All other components within normal limits  LACTIC ACID, PLASMA - Abnormal; Notable for the following components:   Lactic Acid, Venous 3.0 (*)    All other components within normal limits  URINE CULTURE  CULTURE, BLOOD (ROUTINE X 2)  CULTURE, BLOOD (ROUTINE X 2)  LIPASE, BLOOD  D-DIMER, QUANTITATIVE (NOT AT [facility])  TYPE AND SCREEN    EKG EKG Interpretation  Date/Time:  Thursday Oct 23 2017 18:38:21 EDT Ventricular Rate:  72 PR Interval:    QRS Duration: 168 QT Interval:  456 QTC Calculation: 500 R Axis:   -67 Text Interpretation:  Sinus rhythm RBBB and LAFB No significant change since last tracing Confirmed by Fredia Sorrow [phone]) on 10/23/2017 10:59:15 PM   Radiology Dg Chest 2 View  Result Date: 10/23/2017 CLINICAL DATA:  Hypotensive EXAM: CHEST - 2 VIEW COMPARISON:  05/26/2014 FINDINGS: Patient rotation limits the exam. There are bibasilar opacities. No pleural effusion. Heart size within normal limits. Aortic atherosclerosis. Right paratracheal opacity, likely prominent vascular shadow. No pneumothorax. Aortic atherosclerosis. Stable lower thoracic compression. IMPRESSION: 1. Airspace disease at the bilateral lung bases, may reflect atelectasis, pneumonia or aspiration 2. Right paratracheal opacity, suspected to represent prominent vascular shadow, augmented by marked patient rotation. Electronically Signed   By: Donavan Foil M.D.   On: 10/23/2017 20:57    Procedures Procedures (including critical care time)  Medications Ordered in ED Medications  0.9 %  sodium  chloride infusion ( Intravenous New Bag/Given 10/23/17 2115)     Initial Impression / Assessment and Plan / ED Course  I have reviewed the triage vital signs and the nursing notes.  Pertinent labs & imaging results that were available during my care of the patient were reviewed by me and considered in my medical decision making (see chart for details).     Patient with unusual set of circumstances that led to his admission.  Patient with a marked hypotension at home verified several times with blood pressure by EMS.  Patient also was not looking well.  No true syncope.  Patient leg numbness that he thought was asleep at the time has resolved.  Patient wife said he did not look well and was feeling nauseated throughout most of the day.  Work-up here initial blood work with some findings no leukocytosis however does have an anemia with a hemoglobin of 7.8.  No history of any blood in the stools.  Patient also has an elevated lactic acid at 3.  Basic electrolytes consistent with a little bit of acidosis but no significant abnormalities in electrolytes.  Chest x-ray raise some concern for bilateral atelectasis or may be airspace disease.  Patient without fevers here.  EKG has a bilateral bundle branch block no change from the past.  Patient without any chest pain or shortness of breath.  Patient does have past history of pulmonary embolus her d-dimer is pending.  Blood cultures and urine culture added on urinalysis with some questionable borderline.  Type and screen done for the low hemoglobin.  But blood transfusion not ordered.  Discussed with Dr. Olevia Bowens hospitalist he will admit.   Final Clinical Impressions(s) / ED Diagnoses   Final diagnoses:  Hypotension, unspecified hypotension type  Near syncope  Nausea  Anemia, unspecified type  Lactic acidosis    ED Discharge Orders    None       Fredia Sorrow, MD 10/23/17 1552    Fredia Sorrow, MD 10/23/17 2307

## 2017-10-24 ENCOUNTER — Observation Stay (HOSPITAL_BASED_OUTPATIENT_CLINIC_OR_DEPARTMENT_OTHER): Payer: PPO

## 2017-10-24 ENCOUNTER — Encounter (HOSPITAL_COMMUNITY): Payer: Self-pay | Admitting: *Deleted

## 2017-10-24 ENCOUNTER — Observation Stay (HOSPITAL_COMMUNITY): Payer: PPO

## 2017-10-24 DIAGNOSIS — I10 Essential (primary) hypertension: Secondary | ICD-10-CM | POA: Diagnosis not present

## 2017-10-24 DIAGNOSIS — I2510 Atherosclerotic heart disease of native coronary artery without angina pectoris: Secondary | ICD-10-CM

## 2017-10-24 DIAGNOSIS — I340 Nonrheumatic mitral (valve) insufficiency: Secondary | ICD-10-CM

## 2017-10-24 DIAGNOSIS — E872 Acidosis: Secondary | ICD-10-CM | POA: Diagnosis not present

## 2017-10-24 DIAGNOSIS — D649 Anemia, unspecified: Secondary | ICD-10-CM | POA: Diagnosis not present

## 2017-10-24 DIAGNOSIS — I959 Hypotension, unspecified: Secondary | ICD-10-CM | POA: Diagnosis not present

## 2017-10-24 DIAGNOSIS — I2583 Coronary atherosclerosis due to lipid rich plaque: Secondary | ICD-10-CM | POA: Diagnosis not present

## 2017-10-24 DIAGNOSIS — R55 Syncope and collapse: Secondary | ICD-10-CM | POA: Diagnosis not present

## 2017-10-24 DIAGNOSIS — I2699 Other pulmonary embolism without acute cor pulmonale: Secondary | ICD-10-CM

## 2017-10-24 DIAGNOSIS — N390 Urinary tract infection, site not specified: Secondary | ICD-10-CM | POA: Diagnosis not present

## 2017-10-24 DIAGNOSIS — K2270 Barrett's esophagus without dysplasia: Secondary | ICD-10-CM

## 2017-10-24 DIAGNOSIS — R110 Nausea: Secondary | ICD-10-CM

## 2017-10-24 LAB — RETICULOCYTES
RBC.: 3.64 MIL/uL — ABNORMAL LOW (ref 4.22–5.81)
RETIC COUNT ABSOLUTE: 40 10*3/uL (ref 19.0–186.0)
RETIC CT PCT: 1.1 % (ref 0.4–3.1)

## 2017-10-24 LAB — BASIC METABOLIC PANEL
Anion gap: 8 (ref 5–15)
BUN: 18 mg/dL (ref 6–20)
CHLORIDE: 108 mmol/L (ref 101–111)
CO2: 21 mmol/L — AB (ref 22–32)
Calcium: 8.2 mg/dL — ABNORMAL LOW (ref 8.9–10.3)
Creatinine, Ser: 0.91 mg/dL (ref 0.61–1.24)
GFR calc Af Amer: >60 mL/min (ref >60)
GFR calc non Af Amer: >60 mL/min (ref >60)
GLUCOSE: 117 mg/dL — AB (ref 65–99)
Potassium: 4.5 mmol/L (ref 3.5–5.1)
SODIUM: 137 mmol/L (ref 135–145)

## 2017-10-24 LAB — CBC
HEMATOCRIT: 26.7 % — AB (ref 39.0–52.0)
Hemoglobin: 7.9 g/dL — ABNORMAL LOW (ref 13.0–17.0)
MCH: 21.7 pg — ABNORMAL LOW (ref 26.0–34.0)
MCHC: 29.6 g/dL — ABNORMAL LOW (ref 30.0–36.0)
MCV: 73.4 fL — AB (ref 78.0–100.0)
Platelets: 223 10*3/uL (ref 150–400)
RBC: 3.64 MIL/uL — ABNORMAL LOW (ref 4.22–5.81)
RDW: 17.6 % — AB (ref 11.5–15.5)
WBC: 6.4 10*3/uL (ref 4.0–10.5)

## 2017-10-24 LAB — ECHOCARDIOGRAM COMPLETE
Height: 72 in
Weight: 3693.15 oz

## 2017-10-24 LAB — TYPE AND SCREEN
ABO/RH(D): A NEG
ANTIBODY SCREEN: NEGATIVE

## 2017-10-24 LAB — FERRITIN: FERRITIN: 11 ng/mL — AB (ref 24–336)

## 2017-10-24 LAB — VITAMIN B12: VITAMIN B 12: 494 pg/mL (ref 180–914)

## 2017-10-24 LAB — IRON AND TIBC
Iron: 15 ug/dL — ABNORMAL LOW (ref 45–182)
Saturation Ratios: 4 % — ABNORMAL LOW (ref 17.9–39.5)
TIBC: 410 ug/dL (ref 250–450)
UIBC: 395 ug/dL

## 2017-10-24 LAB — ABO/RH: ABO/RH(D): A NEG

## 2017-10-24 LAB — LACTIC ACID, PLASMA: LACTIC ACID, VENOUS: 1.1 mmol/L (ref 0.5–1.9)

## 2017-10-24 LAB — MRSA PCR SCREENING: MRSA by PCR: NEGATIVE

## 2017-10-24 LAB — TROPONIN I: Troponin I: <0.03 ng/mL (ref <0.03)

## 2017-10-24 LAB — FOLATE: Folate: 21.9 ng/mL (ref >5.9)

## 2017-10-24 MED ORDER — LISINOPRIL 20 MG PO TABS: 20.0000 mg | ORAL_TABLET | Freq: Every morning | ORAL | Status: DC

## 2017-10-24 MED ORDER — ALLOPURINOL 300 MG PO TABS
300.0000 mg | ORAL_TABLET | Freq: Every morning | ORAL | Status: DC
  Administered 2017-10-24 – 2017-10-27 (×4): 300 mg via ORAL
  Filled 2017-10-24 (×4): qty 1

## 2017-10-24 MED ORDER — FAMOTIDINE 20 MG PO TABS
20.0000 mg | ORAL_TABLET | Freq: Two times a day (BID) | ORAL | Status: DC
  Administered 2017-10-24 – 2017-10-27 (×7): 20 mg via ORAL
  Filled 2017-10-24 (×7): qty 1

## 2017-10-24 MED ORDER — TRAZODONE HCL 50 MG PO TABS: 50.0000 mg | ORAL_TABLET | Freq: Every evening | ORAL | Status: DC | PRN

## 2017-10-24 MED ORDER — DULOXETINE HCL 30 MG PO CPEP
30.0000 mg | ORAL_CAPSULE | Freq: Every morning | ORAL | Status: DC
  Administered 2017-10-24 – 2017-10-26 (×3): 30 mg via ORAL
  Filled 2017-10-24 (×4): qty 1

## 2017-10-24 MED ORDER — RIVAROXABAN 20 MG PO TABS
20.0000 mg | ORAL_TABLET | Freq: Every day | ORAL | Status: DC
  Administered 2017-10-24 – 2017-10-27 (×4): 20 mg via ORAL
  Filled 2017-10-24 (×4): qty 1

## 2017-10-24 MED ORDER — HYDROCODONE-ACETAMINOPHEN 5-325 MG PO TABS
1.0000 | ORAL_TABLET | Freq: Four times a day (QID) | ORAL | Status: DC | PRN
  Administered 2017-10-24 – 2017-10-26 (×6): 2 via ORAL
  Administered 2017-10-26: 1 via ORAL
  Administered 2017-10-26 – 2017-10-27 (×4): 2 via ORAL
  Filled 2017-10-24 (×11): qty 2

## 2017-10-24 MED ORDER — FERROUS SULFATE 325 (65 FE) MG PO TABS
325.0000 mg | ORAL_TABLET | Freq: Every day | ORAL | Status: DC
  Administered 2017-10-24 – 2017-10-27 (×4): 325 mg via ORAL
  Filled 2017-10-24 (×4): qty 1

## 2017-10-24 NOTE — Progress Notes (Signed)
  Echocardiogram 2D Echocardiogram has been performed.  Darlina Sicilian M 10/24/2017, 2:18 PM

## 2017-10-24 NOTE — Evaluation (Signed)
Physical Therapy Evaluation Patient Details Name: [name] MRN: [id number] DOB: 08-28-1940 Today's Date: 10/24/2017   History of Present Illness  [name] is a 77 y.o. male with medical history significant of AAA, CAD/history of CABG in January of this year, RBBB, hypertension, common iliac aneurysm, pulmonary hypertension, history of PEs and DVTs on Xarelto, osteoarthritis, GERD, Barrett's esophagus, history of esophageal stricture, colon polyps, diverticulosis, gout, chronic lower back pain, history of prostate cancer, history of subdural hematoma, S/P evacuation of [facility] who was brought to the ED via EMS after having a fall at home.`    Clinical Impression  Pt admitted with above diagnosis. Pt currently with functional limitations due to the deficits listed below (see PT Problem List). PTA, pt living at home with wife, community ambulating with [facility] and reports independence with ADLs. Upon evaluation pt presents with high levels of low back pain, and functional decline from baseline with activity intolerance. Patient limited to 7' of ambulation this evening before requiring seat. Pt de sat on RA and had drop in BP with positional changes (see vitals below). Pt affirms he would like to return home and wait for low back pain to resolve, extensive discussion with pt and wife over concerns for future falls.  Pt will benefit from skilled PT to increase their independence and safety with mobility to allow discharge to the venue listed below.     Vitals: -Start of session at rest. BP 142/66 SpO2 95% RA HR 111 -Once Standing: BP 114/48 -With activity (7' of walking): SpO2 79% RA, rising to 93% within 90 seconds of seated rest.  -End of Session supine: BP 134/80      Follow Up Recommendations SNF;Supervision for mobility/OOB    Equipment Recommendations  Rolling walker with 5" wheels    Recommendations for Other Services OT consult     Precautions / Restrictions  Precautions Precautions: Fall Precaution Comments: watch orthostatic hypotension and O2 with activity  Restrictions Weight Bearing Restrictions: No      Mobility  Bed Mobility Overal bed mobility: Needs Assistance Bed Mobility: Supine to Sit;Sit to Supine     Supine to sit: Supervision Sit to supine: Min assist   General bed mobility comments: increased time and effort for supine to sit, min A to help legs up back over EOB to supine due to low back pain   Transfers Overall transfer level: Needs assistance Equipment used: Rolling walker (2 wheeled) Transfers: Sit to/from [facility] Sit to Stand: Min assist Stand pivot transfers: Min assist       General transfer comment: Min A for transfers to provide assistance powering up into RW and stabilization.   Ambulation/Gait Ambulation/Gait assistance: Min guard Ambulation Distance (Feet): 10 Feet Assistive device: Rolling walker (2 wheeled) Gait Pattern/deviations: Step-to pattern;Trunk flexed;Narrow base of support Gait velocity: decreased   General Gait Details: Patient with unsteady gait, flexed trunk and c/o of back pain. walked to doorway in room and requested to sit down, very fatigued. desat to 79% on RA  Stairs            Wheelchair Mobility    Modified Rankin (Stroke Patients Only)       Balance                                             Pertinent Vitals/Pain Pain Assessment: 0-10 Pain Score: 9  Pain Location: lumbar back Pain Descriptors / Indicators: Sharp;Grimacing Pain Intervention(s): Premedicated before session;Repositioned;Monitored during session    [place] expects to be discharged to:: Private residence Living Arrangements: Spouse/significant other Available Help at Discharge: Family;Available 24 hours/day(Wife) Type of Home: Apartment Home Access: Level entry       Home Equipment: Cane - quad      Prior Function Level of  Independence: Independent with assistive device(s)         Comments: utilizing cane for community ambulation      Hand Dominance        Extremity/Trunk Assessment   Upper Extremity Assessment Upper Extremity Assessment: Overall WFL for tasks assessed    Lower Extremity Assessment Lower Extremity Assessment: Overall WFL for tasks assessed    Cervical / Trunk Assessment Cervical / Trunk Assessment: Kyphotic  Communication   Communication: No difficulties  Cognition Arousal/Alertness: Awake/alert Behavior During Therapy: WFL for tasks assessed/performed Overall Cognitive Status: Within Functional Limits for tasks assessed                                        General Comments General comments (skin integrity, edema, etc.): Extensive discussion with patient and family over fall risk and recomendations for further rehab.     Exercises     Assessment/Plan    PT Assessment Patient needs continued PT services  PT Problem List Decreased strength;Decreased range of motion;Decreased activity tolerance;Decreased balance;Decreased mobility;Decreased safety awareness;Pain;Obesity       PT Treatment Interventions DME instruction;Gait training;Functional mobility training;Therapeutic activities;Therapeutic exercise;Balance training    PT Goals (Current goals can be found in the Care Plan section)  Acute Rehab PT Goals Patient Stated Goal: go home, does not want rehab PT Goal Formulation: With patient Time For Goal Achievement: 11/07/17 Potential to Achieve Goals: Fair    Frequency Min 2X/week   Barriers to discharge        Co-evaluation               AM-PAC PT "6 Clicks" Daily Activity  Outcome Measure Difficulty turning over in bed (including adjusting bedclothes, sheets and blankets)?: A Little Difficulty moving from lying on back to sitting on the side of the bed? : A Little Difficulty sitting down on and standing up from a chair with arms  (e.g., wheelchair, bedside commode, etc,.)?: A Little Help needed moving to and from a bed to chair (including a wheelchair)?: A Little Help needed walking in hospital room?: A Lot Help needed climbing 3-5 steps with a railing? : Total 6 Click Score: 15    End of Session Equipment Utilized During Treatment: Gait belt Activity Tolerance: Patient limited by fatigue Patient left: in bed;with call bell/phone within reach;with nursing/sitter in room Nurse Communication: Mobility status PT Visit Diagnosis: Unsteadiness on feet (R26.81);History of falling (Z91.81);Pain Pain - part of body: (lumbar)    Time: 1840-1930 PT Time Calculation (min) (ACUTE ONLY): 50 min   Charges:   PT Evaluation $PT Eval Moderate Complexity: 1 Mod PT Treatments $Gait Training: 8-22 mins $Self Care/Home Management: 8-22   PT G Codes:       Reinaldo Berber, PT, DPT Acute Rehab Services Pager: [phone]    Reinaldo Berber 10/24/2017, 7:59 PM

## 2017-10-24 NOTE — Progress Notes (Signed)
PROGRESS NOTE  [name] CBJ:[id number] DOB: 03-Mar-1941 DOA: 10/23/2017 PCP: Deland Pretty, MD  HPI/Recap of past 24 hours: [name] is a 77 y.o. male with medical history significant of AAA, CAD/history of CABG in January of this year, RBBB, hypertension, common iliac aneurysm, pulmonary hypertension, history of PEs and DVTs on Xarelto, osteoarthritis, GERD, Barrett's esophagus, history of esophageal stricture, colon polyps, diverticulosis, gout, chronic lower back pain, history of prostate cancer, history of subdural hematoma, S/P evacuation of SDH who was brought to the ED via EMS after having a fall at home.  Per patient, he was seeding in his stool for a long while.  His leg started to feel tingly.  So he tried to stand up, but states that he did not feel his leg and fell.  He denies dyspnea, chest pain, palpitations, orthopnea, PND, but states that he has been having postural dizziness and has been having lower extremity edema for a while.  He complains he has chronic fatigue and mentions that he was told that once he had a CABG, he is felt that he was going to go away.  However, he still feels fatigued.  He denies abdominal pain, nausea, emesis, diarrhea, melena or hematochezia, but complains of dyspepsia.  No dysuria, frequency or hematuria.  No pruritus.  No heat or cold intolerance.  No polyuria, polydipsia, polyphagia or blurred vision.  10/24/17: seen and examined. States his legs gave out. Lab studies significant for iron deficiency anemia. FOBT ordered. Ortho VS ordered. C/w IV fluid. 2D echo w normal EF  And no aortic stenosis.   Assessment/Plan: Principal Problem:   Near syncope Active Problems:   Gout   GERD   BARRETTS ESOPHAGUS   Hypertension   CAD (coronary artery disease)   Bilateral pulmonary embolism (HCC)  Presyncope Suspect 2/2 to symptomatic anemia vs others Obtain ortho vital signs C/w IV fluid Fall precaution-avoid falls-on anticoagulation  Iron  deficiency anemia vs others Started ferrous sulfate Baseline hg 9 Hg 7.9 Repeat CBC in the am Obtain FOBT  CAD s/p CABG No chest pain C/w cardiac meds  Hx of PE C/w xarelto  Elevated D-dimer Unclear etiology C/w close monitoring on telemetry  Elevated lactic acid, resolved    Code Status: Full  Family Communication: None at bedside  Disposition Plan: Home when clinically stable   Consultants:  None  Procedures:  None  Antimicrobials:  None  DVT prophylaxis:  Xarelto   Objective: Vitals:   10/24/17 0400 10/24/17 0825 10/24/17 1124 10/24/17 1532  BP: 133/82 (!) 149/78 (!) 142/73 (!) 145/76  Pulse: 79 92 (!) 101 100  Resp: 17 18 19 17   Temp: 98 F (36.7 C) 98.3 F (36.8 C) 98.6 F (37 C) 98.4 F (36.9 C)  TempSrc: Oral Oral Oral Oral  SpO2: 95% 99% 98% 95%  Weight:      Height:        Intake/Output Summary (Last 24 hours) at 10/24/2017 1625 Last data filed at 10/24/2017 1534 Gross per 24 hour  Intake 2242.25 ml  Output 1250 ml  Net 992.25 ml   Filed Weights   10/23/17 1836 10/24/17 0013  Weight: 107 kg (236 lb) 104.7 kg (230 lb 13.2 oz)    Exam:  . General: 77 y.o. year-old male well developed well nourished in no acute distress.  Alert and oriented x3. . Cardiovascular: Regular rate and rhythm with no rubs or gallops.  No thyromegaly or JVD noted.   Marland Kitchen Respiratory: Clear to auscultation with  no wheezes or rales. Good inspiratory effort. . Abdomen: Soft nontender nondistended with normal bowel sounds x4 quadrants. . Musculoskeletal: No lower extremity edema. 2/4 pulses in all 4 extremities. . Skin: No ulcerative lesions noted or rashes, . Psychiatry: Mood is appropriate for condition and setting   Data Reviewed: CBC: Recent Labs  Lab 10/23/17 2110 10/24/17 0347  WBC 7.4 6.4  NEUTROABS 6.0  --   HGB 7.8* 7.9*  HCT 26.2* 26.7*  MCV 74.0* 73.4*  PLT 253 409   Basic Metabolic Panel: Recent Labs  Lab 10/23/17 2110 10/24/17 0347    NA 136 137  K 4.6 4.5  CL 106 108  CO2 18* 21*  GLUCOSE 101* 117*  BUN 20 18  CREATININE 1.03 0.91  CALCIUM 8.0* 8.2*   GFR: Estimated Creatinine Clearance: 83.7 mL/min (by C-G formula based on SCr of 0.91 mg/dL). Liver Function Tests: Recent Labs  Lab 10/23/17 2110  AST 21  ALT 10*  ALKPHOS 44  BILITOT 0.5  PROT 6.1*  ALBUMIN 3.5   Recent Labs  Lab 10/23/17 2110  LIPASE 25   No results for input(s): AMMONIA in the last 168 hours. Coagulation Profile: No results for input(s): INR, PROTIME in the last 168 hours. Cardiac Enzymes: Recent Labs  Lab 10/23/17 2110  TROPONINI <0.03   BNP (last 3 results) Recent Labs    05/06/17 1146  PROBNP 122   HbA1C: No results for input(s): HGBA1C in the last 72 hours. CBG: No results for input(s): GLUCAP in the last 168 hours. Lipid Profile: No results for input(s): CHOL, HDL, LDLCALC, TRIG, CHOLHDL, LDLDIRECT in the last 72 hours. Thyroid Function Tests: No results for input(s): TSH, T4TOTAL, FREET4, T3FREE, THYROIDAB in the last 72 hours. Anemia Panel: Recent Labs    10/24/17 0347  VITAMINB12 494  FOLATE 21.9  FERRITIN 11*  TIBC 410  IRON 15*  RETICCTPCT 1.1   Urine analysis:    Component Value Date/Time   COLORURINE YELLOW 10/23/2017 [place] 10/23/2017 2221   LABSPEC 1.012 10/23/2017 2221   PHURINE 6.0 10/23/2017 2221   GLUCOSEU NEGATIVE 10/23/2017 2221   HGBUR SMALL (A) 10/23/2017 2221   BILIRUBINUR NEGATIVE 10/23/2017 2221   KETONESUR NEGATIVE 10/23/2017 2221   PROTEINUR NEGATIVE 10/23/2017 2221   UROBILINOGEN 1.0 05/26/2014 1734   NITRITE NEGATIVE 10/23/2017 2221   LEUKOCYTESUR MODERATE (A) 10/23/2017 2221   Sepsis Labs: @LABRCNTIP (procalcitonin:4,lacticidven:4)  ) Recent Results (from the past 240 hour(s))  MRSA PCR Screening     Status: None   Collection Time: 10/24/17  1:14 AM  Result Value Ref Range Status   MRSA by PCR NEGATIVE NEGATIVE Final    Comment:        The  GeneXpert MRSA Assay (FDA approved for NASAL specimens only), is one component of a comprehensive MRSA colonization surveillance program. It is not intended to diagnose MRSA infection nor to guide or monitor treatment for MRSA infections. Performed at [facility], [place] [address] [place], [place] [address]       Studies: X-ray Chest Pa And Lateral  Result Date: 10/24/2017 CLINICAL DATA:  Syncope. EXAM: CHEST - 2 VIEW COMPARISON:  10/23/2017, 05/24/2014, 10/02/2010.  CT 05/25/2014. FINDINGS: Prominence of superior mediastinum noted most likely prominent great vessels. Similar findings noted on prior studies. Stable cardiomegaly. No pulmonary venous congestion. Low lung volumes with bibasilar atelectasis/infiltrates again noted. No significant change. No prominent pleural effusion. No pneumothorax. Stable old lower thoracic vertebral body compression fracture. IMPRESSION: 1.  Stable cardiomegaly.  No pulmonary venous congestion. 2.  Low lung volumes with mild bibasilar atelectasis/infiltrates. Electronically Signed   By: Marcello Moores  Register   On: 10/24/2017 09:32   Dg Chest 2 View  Result Date: 10/23/2017 CLINICAL DATA:  Hypotensive EXAM: CHEST - 2 VIEW COMPARISON:  05/26/2014 FINDINGS: Patient rotation limits the exam. There are bibasilar opacities. No pleural effusion. Heart size within normal limits. Aortic atherosclerosis. Right paratracheal opacity, likely prominent vascular shadow. No pneumothorax. Aortic atherosclerosis. Stable lower thoracic compression. IMPRESSION: 1. Airspace disease at the bilateral lung bases, may reflect atelectasis, pneumonia or aspiration 2. Right paratracheal opacity, suspected to represent prominent vascular shadow, augmented by marked patient rotation. Electronically Signed   By: Donavan Foil M.D.   On: 10/23/2017 20:57    Scheduled Meds: . allopurinol  300 mg Oral q morning - 10a  . DULoxetine  30 mg Oral q morning - 10a  . famotidine  20 mg Oral BID    . ferrous sulfate  325 mg Oral Q breakfast  . rivaroxaban  20 mg Oral Daily  . sodium chloride flush  3 mL Intravenous Q12H    Continuous Infusions: . sodium chloride 75 mL/hr at 10/24/17 1211     LOS: 0 days     Kayleen Memos, MD [facility] Pager [phone]  If 7PM-7AM, please contact night-coverage [email or web address] Password [email or web address] 10/24/2017, 4:25 PM

## 2017-10-25 DIAGNOSIS — I959 Hypotension, unspecified: Secondary | ICD-10-CM | POA: Diagnosis not present

## 2017-10-25 DIAGNOSIS — I2583 Coronary atherosclerosis due to lipid rich plaque: Secondary | ICD-10-CM | POA: Diagnosis not present

## 2017-10-25 DIAGNOSIS — R55 Syncope and collapse: Secondary | ICD-10-CM | POA: Diagnosis not present

## 2017-10-25 DIAGNOSIS — I2699 Other pulmonary embolism without acute cor pulmonale: Secondary | ICD-10-CM | POA: Diagnosis not present

## 2017-10-25 DIAGNOSIS — D649 Anemia, unspecified: Secondary | ICD-10-CM | POA: Diagnosis not present

## 2017-10-25 DIAGNOSIS — E872 Acidosis: Secondary | ICD-10-CM | POA: Diagnosis not present

## 2017-10-25 DIAGNOSIS — N390 Urinary tract infection, site not specified: Secondary | ICD-10-CM | POA: Diagnosis not present

## 2017-10-25 DIAGNOSIS — K2270 Barrett's esophagus without dysplasia: Secondary | ICD-10-CM | POA: Diagnosis not present

## 2017-10-25 DIAGNOSIS — I2510 Atherosclerotic heart disease of native coronary artery without angina pectoris: Secondary | ICD-10-CM | POA: Diagnosis not present

## 2017-10-25 DIAGNOSIS — I10 Essential (primary) hypertension: Secondary | ICD-10-CM | POA: Diagnosis not present

## 2017-10-25 DIAGNOSIS — R110 Nausea: Secondary | ICD-10-CM | POA: Diagnosis not present

## 2017-10-25 LAB — CBC
HCT: 26.5 % — ABNORMAL LOW (ref 39.0–52.0)
HEMOGLOBIN: 7.8 g/dL — AB (ref 13.0–17.0)
MCH: 21.7 pg — ABNORMAL LOW (ref 26.0–34.0)
MCHC: 29.4 g/dL — ABNORMAL LOW (ref 30.0–36.0)
MCV: 73.8 fL — ABNORMAL LOW (ref 78.0–100.0)
PLATELETS: 210 10*3/uL (ref 150–400)
RBC: 3.59 MIL/uL — AB (ref 4.22–5.81)
RDW: 18.1 % — ABNORMAL HIGH (ref 11.5–15.5)
WBC: 6 10*3/uL (ref 4.0–10.5)

## 2017-10-25 LAB — URINE CULTURE: Culture: NO GROWTH

## 2017-10-25 NOTE — Progress Notes (Signed)
PROGRESS NOTE  [name] PPJ:[id number] DOB: 01/24/1941 DOA: 10/23/2017 PCP: Deland Pretty, MD  HPI/Recap of past 24 hours: [name] is a 77 y.o. male with medical history significant of AAA, CAD/history of CABG in January of this year, RBBB, hypertension, common iliac aneurysm, pulmonary hypertension, history of PEs and DVTs on Xarelto, osteoarthritis, GERD, Barrett's esophagus, history of esophageal stricture, colon polyps, diverticulosis, gout, chronic lower back pain, history of prostate cancer, history of subdural hematoma, S/P evacuation of SDH who was brought to the ED via EMS after having a fall at home.  Per patient, he was seeding in his stool for a long while.  His leg started to feel tingly.  So he tried to stand up, but states that he did not feel his leg and fell.  He denies dyspnea, chest pain, palpitations, orthopnea, PND, but states that he has been having postural dizziness and has been having lower extremity edema for a while.  He complains he has chronic fatigue and mentions that he was told that once he had a CABG, he is felt that he was going to go away.  However, he still feels fatigued.  He denies abdominal pain, nausea, emesis, diarrhea, melena or hematochezia, but complains of dyspepsia.  No dysuria, frequency or hematuria.  No pruritus.  No heat or cold intolerance.  No polyuria, polydipsia, polyphagia or blurred vision.  10/24/17: seen and examined. States his legs gave out. Lab studies significant for iron deficiency anemia. FOBT ordered. Ortho VS ordered. C/w IV fluid. 2D echo w normal EF  And no aortic stenosis.  10/25/17: Seen and examined at bedside. Reports persistent fatigue. No new complaints.   Assessment/Plan: Principal Problem:   Near syncope Active Problems:   Gout   GERD   BARRETTS ESOPHAGUS   Hypertension   CAD (coronary artery disease)   Bilateral pulmonary embolism (HCC)  Presyncope, stable Suspect 2/2 to symptomatic anemia vs  others Obtain ortho vital signs C/w IV fluid Fall precaution-avoid falls-on anticoagulation  Iron deficiency anemia, stable Low iron, low sat 4 Started ferrous sulfate Baseline hg 9 Hg 7.9 Repeat CBC in the am Obtain FOBT  CAD s/p CABG, stable No chest pain C/w cardiac meds  Hx of PE, stable C/w xarelto  Elevated D-dimer Unclear etiology C/w close monitoring on telemetry  Elevated lactic acid, resolved  Ambulatory dysfunction PT recommends SNF Fall precaution    Code Status: Full  Family Communication: None at bedside  Disposition Plan: Home when clinically stable   Consultants:  None  Procedures:  None  Antimicrobials:  None  DVT prophylaxis:  Xarelto   Objective: Vitals:   10/25/17 0000 10/25/17 0313 10/25/17 0400 10/25/17 0801  BP:  (!) 146/88 (!) 145/82 (!) 143/79  Pulse: 92 79 76 85  Resp: 16 20 19 14   Temp:  99 F (37.2 C)  98.6 F (37 C)  TempSrc:  Oral  Oral  SpO2: 93% 96% 97% 93%  Weight:      Height:        Intake/Output Summary (Last 24 hours) at 10/25/2017 1211 Last data filed at 10/25/2017 0700 Gross per 24 hour  Intake 2385 ml  Output 875 ml  Net 1510 ml   Filed Weights   10/23/17 1836 10/24/17 0013  Weight: 107 kg (236 lb) 104.7 kg (230 lb 13.2 oz)    Exam:  . General: 77 yo CM WD WN NAD A&O x3 . Cardiovascular: RRR no rubs or gallops. No JVD or thyromegaly . Respiratory:  Clear to auscultation with no wheezes or rales. Good inspiratory effort. . Abdomen: Soft nontender nondistended with normal bowel sounds x4 quadrants. . Musculoskeletal: No lower extremity edema. 2/4 pulses in all 4 extremities. . Skin: No ulcerative lesions noted or rashes, . Psychiatry: Mood is appropriate for condition and setting   Data Reviewed: CBC: Recent Labs  Lab 10/23/17 2110 10/24/17 0347 10/25/17 0307  WBC 7.4 6.4 6.0  NEUTROABS 6.0  --   --   HGB 7.8* 7.9* 7.8*  HCT 26.2* 26.7* 26.5*  MCV 74.0* 73.4* 73.8*  PLT 253 223  956   Basic Metabolic Panel: Recent Labs  Lab 10/23/17 2110 10/24/17 0347  NA 136 137  K 4.6 4.5  CL 106 108  CO2 18* 21*  GLUCOSE 101* 117*  BUN 20 18  CREATININE 1.03 0.91  CALCIUM 8.0* 8.2*   GFR: Estimated Creatinine Clearance: 83.7 mL/min (by C-G formula based on SCr of 0.91 mg/dL). Liver Function Tests: Recent Labs  Lab 10/23/17 2110  AST 21  ALT 10*  ALKPHOS 44  BILITOT 0.5  PROT 6.1*  ALBUMIN 3.5   Recent Labs  Lab 10/23/17 2110  LIPASE 25   No results for input(s): AMMONIA in the last 168 hours. Coagulation Profile: No results for input(s): INR, PROTIME in the last 168 hours. Cardiac Enzymes: Recent Labs  Lab 10/23/17 2110  TROPONINI <0.03   BNP (last 3 results) Recent Labs    05/06/17 1146  PROBNP 122   HbA1C: No results for input(s): HGBA1C in the last 72 hours. CBG: No results for input(s): GLUCAP in the last 168 hours. Lipid Profile: No results for input(s): CHOL, HDL, LDLCALC, TRIG, CHOLHDL, LDLDIRECT in the last 72 hours. Thyroid Function Tests: No results for input(s): TSH, T4TOTAL, FREET4, T3FREE, THYROIDAB in the last 72 hours. Anemia Panel: Recent Labs    10/24/17 0347  VITAMINB12 494  FOLATE 21.9  FERRITIN 11*  TIBC 410  IRON 15*  RETICCTPCT 1.1   Urine analysis:    Component Value Date/Time   COLORURINE YELLOW 10/23/2017 2221   APPEARANCEUR CLEAR 10/23/2017 2221   LABSPEC 1.012 10/23/2017 2221   PHURINE 6.0 10/23/2017 2221   GLUCOSEU NEGATIVE 10/23/2017 2221   HGBUR SMALL (A) 10/23/2017 2221   BILIRUBINUR NEGATIVE 10/23/2017 2221   KETONESUR NEGATIVE 10/23/2017 2221   PROTEINUR NEGATIVE 10/23/2017 2221   UROBILINOGEN 1.0 05/26/2014 1734   NITRITE NEGATIVE 10/23/2017 2221   LEUKOCYTESUR MODERATE (A) 10/23/2017 2221   Sepsis Labs: @LABRCNTIP (procalcitonin:4,lacticidven:4)  ) Recent Results (from the past 240 hour(s))  Urine Culture     Status: None   Collection Time: 10/23/17 10:21 PM  Result Value Ref Range  Status   Specimen Description URINE, RANDOM  Final   Special Requests NONE  Final   Culture   Final    NO GROWTH Performed at [facility], [place] [address] [place], [place] [address]    Report Status 10/25/2017 FINAL  Final  MRSA PCR Screening     Status: None   Collection Time: 10/24/17  1:14 AM  Result Value Ref Range Status   MRSA by PCR NEGATIVE NEGATIVE Final    Comment:        The GeneXpert MRSA Assay (FDA approved for NASAL specimens only), is one component of a comprehensive MRSA colonization surveillance program. It is not intended to diagnose MRSA infection nor to guide or monitor treatment for MRSA infections. Performed at [facility], [place] [address], [place], [place] [address]  Studies: No results found.  Scheduled Meds: . allopurinol  300 mg Oral q morning - 10a  . DULoxetine  30 mg Oral q morning - 10a  . famotidine  20 mg Oral BID  . ferrous sulfate  325 mg Oral Q breakfast  . rivaroxaban  20 mg Oral Daily  . sodium chloride flush  3 mL Intravenous Q12H    Continuous Infusions: . sodium chloride 75 mL/hr at 10/25/17 0600     LOS: 0 days     Kayleen Memos, MD [facility] Pager [phone]  If 7PM-7AM, please contact night-coverage [email or web address] Password [email or web address] 10/25/2017, 12:11 PM

## 2017-10-25 NOTE — NC FL2 (Signed)
[place] MEDICAID FL2 LEVEL OF CARE SCREENING TOOL     IDENTIFICATION  Patient Name: [name] Birthdate: 1941/03/01 Sex: male Admission Date (Current Location): 10/23/2017  [facility] and [place] Number:  Herbalist and Address:  [facility], [place] [address] [place], [place] [address]      Provider Number: [id number]  Attending Physician Name and Address:  Kayleen Memos, DO  Relative Name and Phone Number:       Current Level of Care: Hospital Recommended Level of Care: [place] Prior Approval Number:    Date Approved/Denied:   PASRR Number: [id number]  Discharge Plan: SNF    Current Diagnoses: Patient Active Problem List   Diagnosis Date Noted  . Near syncope 10/23/2017  . History of colonic polyps   . Diarrhea 07/01/2014  . Rectal bleeding 06/28/2014  . Positive blood cultures 05/26/2014  . Bacteremia due to Gram-positive bacteria 05/26/2014  . Leukopenia 05/26/2014  . Neutropenia ([place]) 05/26/2014  . DVT, lower extremity, distal, acute ([place]) 04/19/2014  . Bilateral pulmonary embolism ([place]) 04/14/2014  . Acute pulmonary embolism ([place]) 04/14/2014  . Pulmonary HTN ([place]) 04/12/2014  . Infected sebaceous cyst back 7x5x4cm s/p I&D [id number] 01/11/2013  . Abdominal aortic aneurysm ([place]) 05/25/2012  . CAD (coronary artery disease) 05/25/2012  . Dyspnea 03/30/2012  . Hypertension 03/30/2012  . Bruit 03/30/2012  . Prostate cancer ([place]) 02/03/2012  . GERD 11/26/2007  . ADENOCARCINOMA, COLON 11/25/2007  . Gout 11/25/2007  . ESOPHAGEAL STRICTURE 11/25/2007  . BARRETTS ESOPHAGUS 11/25/2007  . DUODENITIS, WITHOUT HEMORRHAGE 11/25/2007  . DIVERTICULOSIS OF COLON 11/25/2007  . ABDOMINAL PAIN, EPIGASTRIC 11/25/2007  . FOREIGN BODY IN ESOPHAGUS 11/25/2007  . COLONIC POLYPS, ADENOMATOUS, HX OF 11/25/2007    Orientation RESPIRATION BLADDER Height & Weight     Self, Situation, Place, Time  Normal Continent Weight: 230  lb 13.2 oz (104.7 kg) Height:  6' (182.9 cm)  BEHAVIORAL SYMPTOMS/MOOD NEUROLOGICAL BOWEL NUTRITION STATUS      Incontinent Diet(Heart healthy, thin liquids)  AMBULATORY STATUS COMMUNICATION OF NEEDS Skin   Limited Assist Verbally Normal                       Personal Care Assistance Level of Assistance  Bathing, Feeding, Dressing Bathing Assistance: Limited assistance Feeding assistance: Independent Dressing Assistance: Limited assistance     Functional Limitations Info  Sight, Hearing, Speech Sight Info: Adequate Hearing Info: Adequate Speech Info: Adequate    SPECIAL CARE FACTORS FREQUENCY  PT (By licensed PT), OT (By licensed OT)     PT Frequency: 2x OT Frequency: 2x            Contractures Contractures Info: Not present    Additional Factors Info  Code Status, Allergies Code Status Info: Full Code Allergies Info: No known allergies           Current Medications (10/25/2017):  This is the current hospital active medication list Current Facility-Administered Medications  Medication Dose Route Frequency Provider Last Rate Last Dose  . 0.9 %  sodium chloride infusion   Intravenous Continuous Reubin Milan, MD 75 mL/hr at 10/25/17 0600    . acetaminophen (TYLENOL) tablet 650 mg  650 mg Oral Q6H PRN Reubin Milan, MD       Or  . acetaminophen (TYLENOL) suppository 650 mg  650 mg Rectal Q6H PRN Reubin Milan, MD      . allopurinol (ZYLOPRIM) tablet 300 mg  300  mg Oral q morning - 10a Reubin Milan, MD   300 mg at 10/25/17 1018  . DULoxetine (CYMBALTA) DR capsule 30 mg  30 mg Oral q morning - 10a Reubin Milan, MD   30 mg at 10/25/17 1018  . famotidine (PEPCID) tablet 20 mg  20 mg Oral BID Reubin Milan, MD   20 mg at 10/25/17 1019  . ferrous sulfate tablet 325 mg  325 mg Oral Q breakfast Kayleen Memos, DO   325 mg at 10/25/17 2694  . HYDROcodone-acetaminophen (NORCO/VICODIN) 5-325 MG per tablet 1-2 tablet  1-2 tablet Oral Q6H  PRN Vertis Kelch, NP   2 tablet at 10/25/17 0656  . ondansetron (ZOFRAN) tablet 4 mg  4 mg Oral Q6H PRN Reubin Milan, MD       Or  . ondansetron [facility]) injection 4 mg  4 mg Intravenous Q6H PRN Reubin Milan, MD      . rivaroxaban Alveda Reasons) tablet 20 mg  20 mg Oral Daily Reubin Milan, MD   20 mg at 10/25/17 1018  . sodium chloride flush (NS) 0.9 % injection 3 mL  3 mL Intravenous Q12H Reubin Milan, MD   3 mL at 10/25/17 1020  . traZODone (DESYREL) tablet 50 mg  50 mg Oral QHS PRN Reubin Milan, MD         Discharge Medications: Please see discharge summary for a list of discharge medications.  Relevant Imaging Results:  Relevant Lab Results:   Additional Information SSN: [id number]  Eileen Stanford, LCSW

## 2017-10-25 NOTE — Clinical Social Work Note (Signed)
Clinical Social Work Assessment  Patient Details  Name: [name] MRN: [id number] Date of Birth: 1941/04/24  Date of referral:  10/25/17               Reason for consult:  Facility Placement                Permission sought to share information with:  Family Supports Permission granted to share information::  Yes, Verbal Permission Granted  Name::     Building surveyor::  [facility]  Relationship::  Spouse  Contact Information:  [phone]  Housing/Transportation Living arrangements for the past 2 months:  [place] of Information:  Patient Patient Interpreter Needed:  None Criminal Activity/Legal Involvement Pertinent to Current Situation/Hospitalization:  No - Comment as needed Significant Relationships:  Spouse Lives with:  Spouse Do you feel safe going back to the place where you live?  No Need for family participation in patient care:  No (Coment)  Care giving concerns:  Pt is alert and oriented. Pt lived at home independently with spouse prior to admission.   Social Worker assessment / plan:  CSW spoke with pt to complete initial assessment. Pt lives with spouse--pt agreeable to CSW contacting pt's wife if needed. Pt is agreeable to SNF at d/c. Pt has been to [facility] in the past however this time is considering other options. Pt is agreeable to [facility] f/o. CSW to follow up with b/o.  Employment status:  Retired Nurse, adult PT Recommendations:  [place] / Referral to community resources:  [place]  Patient/Family's Response to care:  Pt verbalized understanding of CSW role and expressed appreciation for support. Pt denies any concern regarding pt care at this time.   Patient/Family's Understanding of and Emotional Response to Diagnosis, Current Treatment, and Prognosis:  Pt understanding and realistic regarding physical limitations. Pt understands the need for SNF  placement at d/c. Pt agreeable to SNF placement at d/c, at this time. Pt's responses emotionally appropriate during conversation with CSW. Pt denies any concern regarding treatment plan at this time. CSW will continue to provide support and facilitate d/c needs.   Emotional Assessment Appearance:  Appears stated age Attitude/Demeanor/Rapport:  (Patient was appropriate) Affect (typically observed):  Accepting, Appropriate, Calm Orientation:  Oriented to Situation, Oriented to  Time, Oriented to Place, Oriented to Self Alcohol / Substance use:  Not Applicable Psych involvement (Current and /or in the community):  No (Comment)  Discharge Needs  Concerns to be addressed:  Basic Needs, Care Coordination Readmission within the last 30 days:  No Current discharge risk:  Dependent with Mobility Barriers to Discharge:  Continued Medical Work up   [employer], LCSW 10/25/2017, 12:53 PM

## 2017-10-26 DIAGNOSIS — I2583 Coronary atherosclerosis due to lipid rich plaque: Secondary | ICD-10-CM | POA: Diagnosis not present

## 2017-10-26 DIAGNOSIS — R110 Nausea: Secondary | ICD-10-CM | POA: Diagnosis not present

## 2017-10-26 DIAGNOSIS — N390 Urinary tract infection, site not specified: Secondary | ICD-10-CM

## 2017-10-26 DIAGNOSIS — K2270 Barrett's esophagus without dysplasia: Secondary | ICD-10-CM | POA: Diagnosis not present

## 2017-10-26 DIAGNOSIS — D649 Anemia, unspecified: Secondary | ICD-10-CM | POA: Diagnosis not present

## 2017-10-26 DIAGNOSIS — I2699 Other pulmonary embolism without acute cor pulmonale: Secondary | ICD-10-CM | POA: Diagnosis not present

## 2017-10-26 DIAGNOSIS — I10 Essential (primary) hypertension: Secondary | ICD-10-CM | POA: Diagnosis not present

## 2017-10-26 DIAGNOSIS — R55 Syncope and collapse: Secondary | ICD-10-CM | POA: Diagnosis not present

## 2017-10-26 DIAGNOSIS — I2510 Atherosclerotic heart disease of native coronary artery without angina pectoris: Secondary | ICD-10-CM | POA: Diagnosis not present

## 2017-10-26 DIAGNOSIS — E872 Acidosis: Secondary | ICD-10-CM | POA: Diagnosis not present

## 2017-10-26 DIAGNOSIS — I959 Hypotension, unspecified: Secondary | ICD-10-CM | POA: Diagnosis not present

## 2017-10-26 LAB — CBC
HEMATOCRIT: 25.7 % — AB (ref 39.0–52.0)
HEMOGLOBIN: 7.4 g/dL — AB (ref 13.0–17.0)
MCH: 21.6 pg — ABNORMAL LOW (ref 26.0–34.0)
MCHC: 28.8 g/dL — AB (ref 30.0–36.0)
MCV: 74.9 fL — AB (ref 78.0–100.0)
Platelets: 197 10*3/uL (ref 150–400)
RBC: 3.43 MIL/uL — ABNORMAL LOW (ref 4.22–5.81)
RDW: 18.4 % — AB (ref 11.5–15.5)
WBC: 6 10*3/uL (ref 4.0–10.5)

## 2017-10-26 MED ORDER — SODIUM CHLORIDE 0.9 % IV SOLN
1.0000 g | INTRAVENOUS | Status: DC
  Administered 2017-10-26: 1 g via INTRAVENOUS
  Filled 2017-10-26 (×2): qty 10

## 2017-10-26 MED ORDER — SENNOSIDES-DOCUSATE SODIUM 8.6-50 MG PO TABS
1.0000 | ORAL_TABLET | Freq: Two times a day (BID) | ORAL | Status: DC
  Administered 2017-10-26: 1 via ORAL
  Filled 2017-10-26 (×3): qty 1

## 2017-10-26 MED ORDER — POLYETHYLENE GLYCOL 3350 17 G PO PACK
17.0000 g | PACK | Freq: Every day | ORAL | Status: DC
  Filled 2017-10-26: qty 1

## 2017-10-26 MED ORDER — BISACODYL 5 MG PO TBEC: 5.0000 mg | DELAYED_RELEASE_TABLET | Freq: Every day | ORAL | Status: DC | PRN

## 2017-10-26 NOTE — Progress Notes (Signed)
Pharmacy Antibiotic Note  [name] is a 77 y.o. male admitted on 10/23/2017 with UTI.  Pharmacy has been consulted for ceftriaxone dosing.  UA with pyuria.  Most recent cx negative, patient asymptomatic.  Plan: Ceftriaxone 1g IV q 24 hrs. F/u urine cx. Narrow as able  Height: 6' (182.9 cm) Weight: 245 lb 13 oz (111.5 kg) IBW/kg (Calculated) : 77.6  Temp (24hrs), Avg:98.3 F (36.8 C), Min:98 F (36.7 C), Max:98.7 F (37.1 C)  Recent Labs  Lab 10/23/17 2110 10/24/17 0347 10/25/17 0307 10/26/17 0744  WBC 7.4 6.4 6.0 6.0  CREATININE 1.03 0.91  --   --   LATICACIDVEN 3.0* 1.1  --   --     Estimated Creatinine Clearance: 86.3 mL/min (by C-G formula based on SCr of 0.91 mg/dL).    No Known Allergies  Antimicrobials this admission:  Ceftriaxone 5/5 >  Dose adjustments this admission:   Microbiology results:  5/2 BCx x 2 : ngtd 5/2 UCx: neg 5/3 MRSA PCR: neg  Thank you for allowing pharmacy to be a part of this patient's care.  [place] Crane, Roylene Reason, [facility] Clinical Pharmacist Pager [phone]  10/26/2017 12:18 PM

## 2017-10-26 NOTE — Progress Notes (Signed)
Physical Therapy Treatment Patient Details Name: [name] MRN: [id number] DOB: February 12, 1941 Today's Date: 10/26/2017    History of Present Illness [name] is a 77 y.o. male with medical history significant of AAA, CAD/history of CABG in January of this year, RBBB, hypertension, common iliac aneurysm, pulmonary hypertension, history of PEs and DVTs on Xarelto, osteoarthritis, GERD, Barrett's esophagus, history of esophageal stricture, colon polyps, diverticulosis, gout, chronic lower back pain, history of prostate cancer, history of subdural hematoma, S/P evacuation of SDH who was brought to the ED via EMS after having a fall at home.`    PT Comments    Spoke with RN who reports she was able to ambulate with [name] this morning at a contact guard level of assistance. He currently seems to be most limited by his back pain. He was asymptomatic of dizziness during his session with PT this afternoon (vitals below which look much better than Friday.) Patient eager to return home and both he and his wife agree that he would benefit from [place] for LE strengthening and general function. Updated recommendations. We will continue to follow and track his progress until d/c.   Follow Up Recommendations  Supervision for mobility/OOB;Outpatient PT     Equipment Recommendations  Rolling walker with 5" wheels    Recommendations for Other Services OT consult     Precautions / Restrictions Precautions Precautions: Fall Precaution Comments: watch orthostatic hypotension and O2 with activity  Restrictions Weight Bearing Restrictions: No    Mobility  Bed Mobility                  Transfers Overall transfer level: Needs assistance Equipment used: Rolling walker (2 wheeled) Transfers: Sit to/from Stand Sit to Stand: Supervision         General transfer comment: Supervision for safety. Very slow to rise from reclining chair. VC for hand  placement.  Ambulation/Gait Ambulation/Gait assistance: Min assist Ambulation Distance (Feet): 50 Feet Assistive device: Rolling walker (2 wheeled) Gait Pattern/deviations: Step-to pattern;Trunk flexed Gait velocity: decreased   General Gait Details: Heavy forward flexion and weight bearing through UEs, likely pain limited.  Min assist for initial turn, assisting with walker placement and balance due to posterior lean initially. However once pt started ambulating he was steady with use of RW and min guard assist.   Stairs             Wheelchair Mobility    Modified Rankin (Stroke Patients Only)       Balance                                            Cognition Arousal/Alertness: Awake/alert Behavior During Therapy: WFL for tasks assessed/performed Overall Cognitive Status: Within Functional Limits for tasks assessed                                        Exercises      General Comments General comments (skin integrity, edema, etc.): Orthostatics: Pt asymptomatic throughout entire session today. Seated BP 155/76 HR 94, Standing BP 148/80 HR 101, Standing after 3 minutes BP 124/67 HR 90.      Pertinent Vitals/Pain Pain Assessment: Faces Faces Pain Scale: Hurts whole lot Pain Location: lumbar back Pain Descriptors / Indicators: Sharp;Grimacing Pain Intervention(s): RN gave pain meds  during session;Monitored during session;Limited activity within patient's tolerance    Home Living                      Prior Function            PT Goals (current goals can now be found in the care plan section) Acute Rehab PT Goals Patient Stated Goal: go home, does not want rehab PT Goal Formulation: With patient Time For Goal Achievement: 11/07/17 Potential to Achieve Goals: Fair Progress towards PT goals: Progressing toward goals    Frequency    Min 3X/week      PT Plan Discharge plan needs to be updated;Frequency needs  to be updated    Co-evaluation              AM-PAC PT "6 Clicks" Daily Activity  Outcome Measure  Difficulty turning over in bed (including adjusting bedclothes, sheets and blankets)?: A Little Difficulty moving from lying on back to sitting on the side of the bed? : A Little Difficulty sitting down on and standing up from a chair with arms (e.g., wheelchair, bedside commode, etc,.)?: A Little Help needed moving to and from a bed to chair (including a wheelchair)?: A Little Help needed walking in hospital room?: A Little Help needed climbing 3-5 steps with a railing? : A Lot 6 Click Score: 17    End of Session Equipment Utilized During Treatment: Gait belt Activity Tolerance: Patient limited by pain Patient left: with call bell/phone within reach;with nursing/sitter in room;in chair;with family/visitor present Nurse Communication: Mobility status PT Visit Diagnosis: Unsteadiness on feet (R26.81);History of falling (Z91.81);Pain Pain - part of body: (lumbar)     Time: 1607-3710 PT Time Calculation (min) (ACUTE ONLY): 16 min  Charges:  $Gait Training: 8-22 mins                    G Codes:  Functional Assessment Tool Used: AM-PAC 6 Clicks Basic Mobility;Clinical judgement Functional Limitation: Mobility: Walking and moving around Mobility: Walking and Moving Around Current Status (G2694): At least 40 percent but less than 60 percent impaired, limited or restricted Mobility: Walking and Moving Around Goal Status [id number]): At least 20 percent but less than 40 percent impaired, limited or restricted    Elayne Snare, PT, DPT   Ellouise Newer 10/26/2017, 2:02 PM

## 2017-10-26 NOTE — Evaluation (Signed)
Occupational Therapy Evaluation Patient Details Name: [name] MRN: [id number] DOB: 07-03-1940 Today's Date: 10/26/2017    History of Present Illness [name] is a 77 y.o. male with medical history significant of AAA, CAD/history of CABG in January of this year, RBBB, hypertension, common iliac aneurysm, pulmonary hypertension, history of PEs and DVTs on Xarelto, osteoarthritis, GERD, Barrett's esophagus, history of esophageal stricture, colon polyps, diverticulosis, gout, chronic lower back pain, history of prostate cancer, history of subdural hematoma, S/P evacuation of SDH who was brought to the ED via EMS after having a fall at home.`   Clinical Impression   PTA Pt mod I for ADL and mobility with SPC. Pt is currently max A for LB ADL and min A for transfers. Pt's biggest complaint is the pain in his lumbar spine. Pt educated on AE for LB ADL to decrease the amount of bending required, and reduce pain. Pt will require continued skilled OT in the acute setting and afterwards at the [facility] level. Both the patient, and his wife are in agreement that OT in the home environment to problem solve ADL and safety. This is a key part to his independence and recovery.     Follow Up Recommendations  Home health OT;Supervision/Assistance - 24 hour    Equipment Recommendations  None recommended by OT(Pt has appropriate DME)    Recommendations for Other Services       Precautions / Restrictions Precautions Precautions: Fall Precaution Comments: watch orthostatic hypotension and O2 with activity  Restrictions Weight Bearing Restrictions: No      Mobility Bed Mobility               General bed mobility comments: Pt sitting in recliner when OT entered  Transfers Overall transfer level: Needs assistance Equipment used: Rolling walker (2 wheeled) Transfers: Sit to/from Stand Sit to Stand: Supervision         General transfer comment: Supervision for safety. Very slow to rise  from reclining chair. VC for hand placement.    Balance Overall balance assessment: Mild deficits observed, not formally tested                                         ADL either performed or assessed with clinical judgement   ADL Overall ADL's : Needs assistance/impaired Eating/Feeding: Independent   Grooming: Set up;Sitting   Upper Body Bathing: Minimal assistance;With caregiver independent assisting;Sitting Upper Body Bathing Details (indicate cue type and reason): educated on long handle sponge Lower Body Bathing: Maximal assistance;Sitting/lateral leans Lower Body Bathing Details (indicate cue type and reason): educated on long handle sponge Upper Body Dressing : Minimal assistance;Sitting   Lower Body Dressing: Maximal assistance;Sit to/from stand Lower Body Dressing Details (indicate cue type and reason): unable to don/doff socks at this time Toilet Transfer: Minimal assistance;Ambulation;RW;Grab bars   Toileting- Clothing Manipulation and Hygiene: Minimal assistance;Sit to/from stand       Functional mobility during ADLs: Minimal assistance;Min guard;Rolling walker(increased time required) General ADL Comments: Pt limited by back pain. Educated on use of AE for LB ADL - demonstrated for Pt with demo kit.     Vision Baseline Vision/History: Wears glasses Wears Glasses: At all times Patient Visual Report: No change from baseline Vision Assessment?: No apparent visual deficits     Perception     Praxis      Pertinent Vitals/Pain Pain Assessment: 0-10 Pain Score: 7  Pain Location: lumbar back Pain Descriptors / Indicators: Sharp;Grimacing Pain Intervention(s): Limited activity within patient's tolerance;Monitored during session;Premedicated before session     Hand Dominance Right   Extremity/Trunk Assessment Upper Extremity Assessment Upper Extremity Assessment: Overall WFL for tasks assessed   Lower Extremity Assessment Lower Extremity  Assessment: Defer to PT evaluation   Cervical / Trunk Assessment Cervical / Trunk Assessment: Kyphotic   Communication Communication Communication: No difficulties   Cognition Arousal/Alertness: Awake/alert Behavior During Therapy: WFL for tasks assessed/performed Overall Cognitive Status: Within Functional Limits for tasks assessed                                     General Comments  Pt's wife present throughout session    Exercises     Shoulder Instructions      Home Living Family/patient expects to be discharged to:: Private residence Living Arrangements: Spouse/significant other Available Help at Discharge: Family;Available 24 hours/day(wife) Type of Home: Apartment Home Access: Level entry     Home Layout: Able to live on main level with bedroom/bathroom     Bathroom Shower/Tub: Occupational psychologist: Handicapped height Bathroom Accessibility: Yes How Accessible: Accessible via wheelchair Home Equipment: Harris - quad;Shower seat - built in;Grab bars - tub/shower;Adaptive equipment Adaptive Equipment: Reacher;Sock aid        Prior Functioning/Environment Level of Independence: Independent with assistive device(s)        Comments: utilizing cane for community ambulation         OT Problem List: Decreased activity tolerance;Decreased range of motion;Impaired balance (sitting and/or standing);Decreased knowledge of use of DME or AE;Decreased knowledge of precautions;Pain      OT Treatment/Interventions: Self-care/ADL training;DME and/or AE instruction;Energy conservation;Therapeutic activities;Patient/family education;Balance training    OT Goals(Current goals can be found in the care plan section) Acute Rehab OT Goals Patient Stated Goal: go home, does not want rehab OT Goal Formulation: With patient/family Time For Goal Achievement: 11/09/17 Potential to Achieve Goals: Good ADL Goals Pt Will Perform Lower Body Bathing: with  modified independence;with adaptive equipment;sit to/from stand Pt Will Perform Lower Body Dressing: with modified independence;sit to/from stand;with adaptive equipment Pt Will Transfer to Toilet: with modified independence;ambulating Pt Will Perform Toileting - Clothing Manipulation and hygiene: with modified independence;sit to/from stand Additional ADL Goal #1: Pt will perform bed mobility at supervision level prior to engaging in ADL activity  OT Frequency: Min 2X/week   Barriers to D/C:            Co-evaluation              AM-PAC PT "6 Clicks" Daily Activity     Outcome Measure Help from another person eating meals?: None Help from another person taking care of personal grooming?: None(seated) Help from another person toileting, which includes using toliet, bedpan, or urinal?: A Little Help from another person bathing (including washing, rinsing, drying)?: A Lot Help from another person to put on and taking off regular upper body clothing?: A Lot Help from another person to put on and taking off regular lower body clothing?: A Lot 6 Click Score: 17   End of Session Equipment Utilized During Treatment: Gait belt;Rolling walker Nurse Communication: Mobility status  Activity Tolerance: Patient tolerated treatment well Patient left: in chair;with call bell/phone within reach;with family/visitor present  OT Visit Diagnosis: Unsteadiness on feet (R26.81);Pain Pain - Right/Left: (central) Pain - part of body: (lumbar spine)  Time: 6606-3016 OT Time Calculation (min): 32 min Charges:  OT General Charges $OT Visit: 1 Visit OT Evaluation $OT Eval Moderate Complexity: 1 Mod OT Treatments $Self Care/Home Management : 8-22 mins G-Codes:     Hulda Humphrey OTR/L [place] 10/26/2017, 5:20 PM

## 2017-10-26 NOTE — Progress Notes (Signed)
Patient ambulated approx 182ft w/rolling walker and just contact guard assist.  Patient ambulates with a kyphotic stance, which he and wife both state is his baseline due to back pain.  No concerns for safety noted by this Probation officer.  Patient ambulated on RA and maintained O2 sats in the 90s.  BP upon returning to sit in bedside recliner was 168/85 (110) w/HR 102.  Patient states he felt slightly SOB but returned to baseline within 2-3 minutes of rest in recliner.  Patient states he does NOT want to go to skilled facility but wants to return home w/spouse.  CSW to be advised. This Probation officer informs patient that PT may re-evaluate tomorrow re: discharge recommendations.

## 2017-10-26 NOTE — Clinical Social Work Note (Signed)
PT has now changed their recommendations to outpatient PT due to pt's updated level of assistance. Pt has significantly improved only needing contact guard assistant. Pt is eager to d/c home. Clinical Social Worker will sign off for now as social work intervention is no longer needed. Please consult [place] again if new need arises.   [name] [name] 10/26/2017

## 2017-10-26 NOTE — Progress Notes (Addendum)
PROGRESS NOTE  [name] IRW:[id number] DOB: October 19, 1940 DOA: 10/23/2017 PCP: Deland Pretty, MD  HPI/Recap of past 24 hours: [name] is a 77 y.o. male with medical history significant of AAA, CAD/history of CABG in January of this year, RBBB, hypertension, common iliac aneurysm, pulmonary hypertension, history of PEs and DVTs on Xarelto, osteoarthritis, GERD, Barrett's esophagus, history of esophageal stricture, colon polyps, diverticulosis, gout, chronic lower back pain, history of prostate cancer, history of subdural hematoma, S/P evacuation of SDH who was brought to the ED via EMS after having a fall at home.  Per patient, he was seeding in his stool for a long while.  His leg started to feel tingly.  So he tried to stand up, but states that he did not feel his leg and fell.  He denies dyspnea, chest pain, palpitations, orthopnea, PND, but states that he has been having postural dizziness and has been having lower extremity edema for a while.  He complains he has chronic fatigue and mentions that he was told that once he had a CABG, he is felt that he was going to go away.  However, he still feels fatigued.  He denies abdominal pain, nausea, emesis, diarrhea, melena or hematochezia, but complains of dyspepsia.  No dysuria, frequency or hematuria.  No pruritus.  No heat or cold intolerance.  No polyuria, polydipsia, polyphagia or blurred vision.  10/24/17: seen and examined. States his legs gave out. Lab studies significant for iron deficiency anemia. FOBT ordered. Ortho VS ordered. C/w IV fluid. 2D echo w normal EF  And no aortic stenosis.  10/25/17: Seen and examined at bedside. Reports persistent fatigue. No new complaints.  10/26/17: Patient seen and examined at his bedside.  Reports persistent fatigue.  Denies dysuria or lower abdominal pain.  UA done on presentation revealed pyuria with UA WBC 11-20, rare bacteria and moderate leukocytes.  No growth to date on urine culture.  Will treat  empirically.   Assessment/Plan: Principal Problem:   Near syncope Active Problems:   Gout   GERD   BARRETTS ESOPHAGUS   Hypertension   CAD (coronary artery disease)   Bilateral pulmonary embolism (HCC)  Presyncope, no recurrence in the hospital Suspect 2/2 to symptomatic anemia vs orthostatic Positive orthostatic vital signs done today 10/26/2017 Fall precaution-on oral anticoagulation Encourage increasing oral fluid due to positive orthostatics  Persistent generalized weakness UA on admission positive for pyuria Urine culture negative to date We will treat empirically with IV ceftriaxone Encouraged to increase p.o. calorie intake PT to evaluate  Acute lower UTI Denies dysuria but will treat due to persistent generalized weakness Afebrile with no leukocytosis Urine culture no growth to date Pyuria on presentation Start IV ceftriaxone empirically x3 days  Iron deficiency anemia, stable Continue iron supplement Hemoglobin 7.4 from 7.8 yesterday No sign of overt bleeding Obtain FOBT if patient has a bowel movement Self-reported last colonoscopy was 2 to 3 years ago with polyps removed  Coronary artery disease status post CABG, stable No chest pain C/w home cardiac meds  Hx of PE, stable No hypoxia O2 supplementation if needed to maintain O2 saturation 92% or greater Continue Xarelto  Elevated D-dimer Unclear etiology C/w close monitoring on telemetry  Elevated lactic acid, resolved  Ambulatory dysfunction PT recommends SNF Social worker consulted Patient declines SNF at this time Fall precautions on oral anticoagulation  Chronic diastolic CHF Appears stable Not in exacerbation Appears euvolemic Caution with IV fluid Chest x-ray done on admission 10/23/17 personally reviewed revealed mild  increase in pulmonary vascularity suggestive of mild pulmonary edema Last 2D echo done on 10/24/2017 revealed preserved LVEF 55%    Code Status: Full  Family  Communication: None at bedside  Disposition Plan: Home when clinically stable   Consultants:  None  Procedures:  None  Antimicrobials:  None  DVT prophylaxis:  Xarelto   Objective: Vitals:   10/26/17 0400 10/26/17 0500 10/26/17 0730 10/26/17 1031  BP:   (!) 149/81 135/72  Pulse: 81  81 88  Resp: 17  15 18   Temp:   98 F (36.7 C) 98.7 F (37.1 C)  TempSrc:   Oral Oral  SpO2: 98%  99% 97%  Weight:  111.5 kg (245 lb 13 oz)    Height:        Intake/Output Summary (Last 24 hours) at 10/26/2017 1201 Last data filed at 10/26/2017 1100 Gross per 24 hour  Intake 2720 ml  Output 650 ml  Net 2070 ml   Filed Weights   10/23/17 1836 10/24/17 0013 10/26/17 0500  Weight: 107 kg (236 lb) 104.7 kg (230 lb 13.2 oz) 111.5 kg (245 lb 13 oz)    Exam:  . General: 77 year old Caucasian male well-developed well-nourished in no acute distress.  Alert and oriented x3.  .  Cardiovascular: Regular rate and rhythm with no rubs or gallops.  No JVD or thyromegaly noted.   Marland Kitchen Respiratory: Clear to auscultation with no wheezes or rales.  Good inspiratory effort.   . Abdomen: Soft nontender nondistended with normal bowel sounds x4 quadrants. . Musculoskeletal: No lower extremity edema. 2/4 pulses in all 4 extremities. . Skin: No ulcerative lesions noted or rashes, . Psychiatry: Mood is appropriate for condition and setting   Data Reviewed: CBC: Recent Labs  Lab 10/23/17 2110 10/24/17 0347 10/25/17 0307 10/26/17 0744  WBC 7.4 6.4 6.0 6.0  NEUTROABS 6.0  --   --   --   HGB 7.8* 7.9* 7.8* 7.4*  HCT 26.2* 26.7* 26.5* 25.7*  MCV 74.0* 73.4* 73.8* 74.9*  PLT 253 223 210 809   Basic Metabolic Panel: Recent Labs  Lab 10/23/17 2110 10/24/17 0347  NA 136 137  K 4.6 4.5  CL 106 108  CO2 18* 21*  GLUCOSE 101* 117*  BUN 20 18  CREATININE 1.03 0.91  CALCIUM 8.0* 8.2*   GFR: Estimated Creatinine Clearance: 86.3 mL/min (by C-G formula based on SCr of 0.91 mg/dL). Liver Function  Tests: Recent Labs  Lab 10/23/17 2110  AST 21  ALT 10*  ALKPHOS 44  BILITOT 0.5  PROT 6.1*  ALBUMIN 3.5   Recent Labs  Lab 10/23/17 2110  LIPASE 25   No results for input(s): AMMONIA in the last 168 hours. Coagulation Profile: No results for input(s): INR, PROTIME in the last 168 hours. Cardiac Enzymes: Recent Labs  Lab 10/23/17 2110  TROPONINI <0.03   BNP (last 3 results) Recent Labs    05/06/17 1146  PROBNP 122   HbA1C: No results for input(s): HGBA1C in the last 72 hours. CBG: No results for input(s): GLUCAP in the last 168 hours. Lipid Profile: No results for input(s): CHOL, HDL, LDLCALC, TRIG, CHOLHDL, LDLDIRECT in the last 72 hours. Thyroid Function Tests: No results for input(s): TSH, T4TOTAL, FREET4, T3FREE, THYROIDAB in the last 72 hours. Anemia Panel: Recent Labs    10/24/17 0347  VITAMINB12 494  FOLATE 21.9  FERRITIN 11*  TIBC 410  IRON 15*  RETICCTPCT 1.1   Urine analysis:    Component Value Date/Time  COLORURINE YELLOW 10/23/2017 [place] 10/23/2017 2221   LABSPEC 1.012 10/23/2017 2221   PHURINE 6.0 10/23/2017 2221   GLUCOSEU NEGATIVE 10/23/2017 2221   HGBUR SMALL (A) 10/23/2017 2221   BILIRUBINUR NEGATIVE 10/23/2017 2221   KETONESUR NEGATIVE 10/23/2017 2221   PROTEINUR NEGATIVE 10/23/2017 2221   UROBILINOGEN 1.0 05/26/2014 1734   NITRITE NEGATIVE 10/23/2017 2221   LEUKOCYTESUR MODERATE (A) 10/23/2017 2221   Sepsis Labs: @LABRCNTIP (procalcitonin:4,lacticidven:4)  ) Recent Results (from the past 240 hour(s))  Urine Culture     Status: None   Collection Time: 10/23/17 10:21 PM  Result Value Ref Range Status   Specimen Description URINE, RANDOM  Final   Special Requests NONE  Final   Culture   Final    NO GROWTH Performed at [facility], [place] [address] [place], [place] [address]    Report Status 10/25/2017 FINAL  Final  Culture, blood (Routine X 2) w Reflex to ID Panel     Status: None (Preliminary  result)   Collection Time: 10/23/17 11:45 PM  Result Value Ref Range Status   Specimen Description BLOOD RIGHT HAND  Final   Special Requests   Final    BOTTLES DRAWN AEROBIC AND ANAEROBIC Blood Culture adequate volume   Culture   Final    NO GROWTH 2 DAYS Performed at [facility], [place] [address] [place], [place] [address]    Report Status PENDING  Incomplete  Culture, blood (Routine X 2) w Reflex to ID Panel     Status: None (Preliminary result)   Collection Time: 10/23/17 11:50 PM  Result Value Ref Range Status   Specimen Description BLOOD LEFT HAND  Final   Special Requests   Final    BOTTLES DRAWN AEROBIC AND ANAEROBIC Blood Culture adequate volume   Culture   Final    NO GROWTH 2 DAYS Performed at [facility], [place] [address], [place], [place] [address]    Report Status PENDING  Incomplete  MRSA PCR Screening     Status: None   Collection Time: 10/24/17  1:14 AM  Result Value Ref Range Status   MRSA by PCR NEGATIVE NEGATIVE Final    Comment:        The GeneXpert MRSA Assay (FDA approved for NASAL specimens only), is one component of a comprehensive MRSA colonization surveillance program. It is not intended to diagnose MRSA infection nor to guide or monitor treatment for MRSA infections. Performed at [facility], [place] [address], [place], [place] [address]       Studies: No results found.  Scheduled Meds: . allopurinol  300 mg Oral q morning - 10a  . DULoxetine  30 mg Oral q morning - 10a  . famotidine  20 mg Oral BID  . ferrous sulfate  325 mg Oral Q breakfast  . polyethylene glycol  17 g Oral Daily  . rivaroxaban  20 mg Oral Daily  . senna-docusate  1 tablet Oral BID  . sodium chloride flush  3 mL Intravenous Q12H    Continuous Infusions:    LOS: 0 days     Kayleen Memos, MD [facility] Pager [phone]  If 7PM-7AM, please contact night-coverage [email or web address] Password [email or web address] 10/26/2017, 12:01 PM

## 2017-10-27 DIAGNOSIS — D649 Anemia, unspecified: Secondary | ICD-10-CM | POA: Diagnosis not present

## 2017-10-27 DIAGNOSIS — K2270 Barrett's esophagus without dysplasia: Secondary | ICD-10-CM | POA: Diagnosis not present

## 2017-10-27 DIAGNOSIS — R110 Nausea: Secondary | ICD-10-CM | POA: Diagnosis not present

## 2017-10-27 DIAGNOSIS — R55 Syncope and collapse: Secondary | ICD-10-CM | POA: Diagnosis not present

## 2017-10-27 DIAGNOSIS — I2510 Atherosclerotic heart disease of native coronary artery without angina pectoris: Secondary | ICD-10-CM | POA: Diagnosis not present

## 2017-10-27 DIAGNOSIS — I10 Essential (primary) hypertension: Secondary | ICD-10-CM | POA: Diagnosis not present

## 2017-10-27 DIAGNOSIS — I959 Hypotension, unspecified: Secondary | ICD-10-CM | POA: Diagnosis not present

## 2017-10-27 DIAGNOSIS — E872 Acidosis: Secondary | ICD-10-CM | POA: Diagnosis not present

## 2017-10-27 DIAGNOSIS — N390 Urinary tract infection, site not specified: Secondary | ICD-10-CM | POA: Diagnosis not present

## 2017-10-27 DIAGNOSIS — I2583 Coronary atherosclerosis due to lipid rich plaque: Secondary | ICD-10-CM | POA: Diagnosis not present

## 2017-10-27 DIAGNOSIS — I2699 Other pulmonary embolism without acute cor pulmonale: Secondary | ICD-10-CM | POA: Diagnosis not present

## 2017-10-27 LAB — BASIC METABOLIC PANEL
ANION GAP: 8 (ref 5–15)
BUN: 18 mg/dL (ref 6–20)
CALCIUM: 7.9 mg/dL — AB (ref 8.9–10.3)
CO2: 24 mmol/L (ref 22–32)
Chloride: 105 mmol/L (ref 101–111)
Creatinine, Ser: 0.85 mg/dL (ref 0.61–1.24)
Glucose, Bld: 103 mg/dL — ABNORMAL HIGH (ref 65–99)
Potassium: 4 mmol/L (ref 3.5–5.1)
Sodium: 137 mmol/L (ref 135–145)

## 2017-10-27 LAB — CBC
HEMATOCRIT: 24.2 % — AB (ref 39.0–52.0)
Hemoglobin: 7.1 g/dL — ABNORMAL LOW (ref 13.0–17.0)
MCH: 21.9 pg — ABNORMAL LOW (ref 26.0–34.0)
MCHC: 29.3 g/dL — ABNORMAL LOW (ref 30.0–36.0)
MCV: 74.7 fL — ABNORMAL LOW (ref 78.0–100.0)
PLATELETS: 210 10*3/uL (ref 150–400)
RBC: 3.24 MIL/uL — ABNORMAL LOW (ref 4.22–5.81)
RDW: 18.5 % — AB (ref 11.5–15.5)
WBC: 6.1 10*3/uL (ref 4.0–10.5)

## 2017-10-27 LAB — PREPARE RBC (CROSSMATCH)

## 2017-10-27 MED ORDER — FERROUS SULFATE 325 (65 FE) MG PO TABS: 325.0000 mg | ORAL_TABLET | Freq: Every day | ORAL | 0 refills | Status: DC

## 2017-10-27 MED ORDER — POLYETHYLENE GLYCOL 3350 17 G PO PACK: 17.0000 g | PACK | Freq: Every day | ORAL | 0 refills | Status: DC

## 2017-10-27 MED ORDER — SODIUM CHLORIDE 0.9 % IV SOLN: Freq: Once | INTRAVENOUS | Status: DC

## 2017-10-27 NOTE — Discharge Instructions (Signed)
Blood Transfusion A blood transfusion is a procedure in which you are given blood through an IV tube. You may need this procedure because of:  Illness.  Surgery.  Injury.  The blood may come from someone else (a donor). You may also be able to donate blood for yourself (autologous blood donation). The blood given in a transfusion is made up of different types of cells. You may get:  Red blood cells. These carry oxygen to the cells in the body.  White blood cells. These help you fight infections.  Platelets. These help your blood to clot.  Plasma. This is the liquid part of your blood. It helps with fluid imbalances.  If you have a clotting disorder, you may also get other types of blood products. What happens before the procedure?  You will have a blood test to find out your blood type. The test also finds out what type of blood your body will accept and matches it to the donor type.  If you are going to have a planned surgery, you may be able to donate your own blood. This may be done in case you need a transfusion.  If you have had an allergic reaction to a transfusion in the past, you may be given medicine to help prevent a reaction. This medicine may be given to you by mouth or through an IV.  You will have your temperature, blood pressure, and pulse checked.  Follow instructions from your doctor about what you cannot eat or drink.  Ask your doctor about: ? Changing or stopping your regular medicines. This is important if you take diabetes medicines or blood thinners. ? Taking medicines such as aspirin and ibuprofen. These medicines can thin your blood. Do not take these medicines before your procedure if your doctor tells you not to. What happens during the procedure?  An IV tube will be put into one of your veins.  The bag of donated blood will be attached to your IV tube. Then, the blood will enter through your vein.  Your temperature, blood pressure, and pulse will be  checked regularly during the procedure. This is done to find early signs of a transfusion reaction.  If you have any signs or symptoms of a reaction, your transfusion will be stopped. You may also be given medicine.  When the transfusion is done, your IV tube will be taken out.  Pressure may be applied to the IV site for a few minutes.  A bandage (dressing) will be put on the IV site. The procedure may vary among doctors and hospitals. What happens after the procedure?  Your temperature, blood pressure, heart rate, breathing rate, and blood oxygen level will be checked often.  Your blood may be tested to see how you are responding to the transfusion.  You may be warmed with fluids or blankets. This is done to keep the temperature of your body normal. Summary  A blood transfusion is a procedure in which you are given blood through an IV tube.  The blood may come from someone else (a donor). You may also be able to donate blood for yourself.  If you have had an allergic reaction to a transfusion in the past, you may be given medicine to help prevent a reaction. This medicine may be given to you by mouth or through an IV tube.  Your temperature, blood pressure, heart rate, breathing rate, and blood oxygen level will be checked often.  Your blood may be tested to   see how you are responding to the transfusion. This information is not intended to replace advice given to you by your health care provider. Make sure you discuss any questions you have with your health care provider. Document Released: 09/06/2008 Document Revised: 02/02/2016 Document Reviewed: 02/02/2016 Elsevier Interactive Patient Education  2017 Elsevier Inc.  

## 2017-10-27 NOTE — Discharge Summary (Addendum)
Discharge Summary  [name] ZOX:[id number] DOB: [birth year]  PCP: Deland Pretty, MD  Admit date: 10/23/2017 Discharge date: 10/27/2017  Time spent: 25 minutes  Recommendations for Outpatient Follow-up:  1. Follow-up with PCP 2. Follow-up with urology 3. Follow-up with cardiology 4. Take your medications as prescribed 5. Continue PT 6. Fall precautions 7. Repeat CBC on Wednesday, 10/29/2017  Discharge Diagnoses:  Active Hospital Problems   Diagnosis Date Noted  . Near syncope 10/23/2017  . Bilateral pulmonary embolism ([place]) 04/14/2014  . CAD (coronary artery disease) 05/25/2012  . Hypertension 03/30/2012  . GERD 11/26/2007  . Gout 11/25/2007  . BARRETTS ESOPHAGUS 11/25/2007    Resolved Hospital Problems  No resolved problems to display.    Discharge Condition: Stable  Diet recommendation: Resume previous diet  Vitals:   10/27/17 0159 10/27/17 0400  BP:    Pulse: 91 83  Resp: 15 14  Temp:    SpO2: (!) 89% 93%    History of present illness:  [name] a 77 y.o.malewith medical history significant ofAAA, CAD/history of CABG in January of this year, RBBB, hypertension, common iliac aneurysm, pulmonary hypertension, history of PEs and DVTs on Xarelto, osteoarthritis, GERD, Barrett's esophagus, history of esophageal stricture, colon polyps, diverticulosis, gout, chronic lower back pain, history of prostate cancer, history of subdural hematoma,S/Pevacuation of [facility] who was brought to the ED via EMS after having a fall at home.  Per patient, he was seeding in his stool for a long while. His leg started to feel tingly. So he tried to stand up, but states that he did not feel his leg and fell. He denies dyspnea, chest pain, palpitations, orthopnea, PND, but states that he has been having postural dizziness and has been having lower extremity edema for a while. He complains he has chronic fatigue and mentions that he was told that once he had a CABG, he is felt  that he was going to go away. However, he still feels fatigued. He denies abdominal pain, nausea, emesis, diarrhea, melena or hematochezia, but complains of dyspepsia. No dysuria, frequency or hematuria. No pruritus. No heat or cold intolerance. No polyuria, polydipsia, polyphagia or blurred vision.  10/24/17: seen and examined. States his legs gave out. Lab studies significant for iron deficiency anemia. FOBT ordered. Ortho VS ordered. C/w IV fluid. 2D echo w normal EF  And no aortic stenosis.  10/25/17: Seen and examined at bedside. Reports persistent fatigue. No new complaints.  10/26/17: Patient seen and examined at his bedside.  Reports persistent fatigue.  Denies dysuria or lower abdominal pain.  UA done on presentation revealed pyuria with UA WBC 11-20, rare bacteria and moderate leukocytes.  No growth to date on urine culture.  Will treat empirically.  10/27/2017: Seen and examined at his bedside.  Has no new complaints.  Denies dysuria, abdominal pain, or nausea.  States he had a urologic procedure done 1-2 weeks prior to presentation.  Will discontinue ceftriaxone.  Patient inquires about going home.  Hemoglobin 7.1 in the setting of presyncope and coronary artery disease.  Patient will be transfused 1 unit PRBC prior to discharge and repeat CBC outpatient on Wednesday, 10/29/2017.  On the day of discharge patient was hemodynamically stable.  He will need to follow-up with his PCP, urology, and cardiology post hospitalization.    Hospital Course:  Principal Problem:   Near syncope Active Problems:   Gout   GERD   BARRETTS ESOPHAGUS   Hypertension   CAD (coronary artery disease)   Bilateral  pulmonary embolism (HCC)  Presyncope, no recurrence in the hospital Suspect 2/2 to symptomatic anemia vs orthostatic Positive orthostatic vital signs done today 10/26/2017 Fall precaution-on oral anticoagulation Encourage increasing oral fluid due to positive orthostatics  Persistent  generalized weakness UA on admission positive for pyuria Urine culture negative to date We will treat empirically with IV ceftriaxone Encouraged to increase p.o. calorie intake PT recommended revision for mobility/outpatient PT Patient has declined SNF  Pyuria possibly secondary to recent urological procedure Denies dysuria, nausea, or lower abdominal pain Afebrile with no leukocytosis Urine culture no growth to date Received 1 dose of IV ceftriaxone on 10/26/2017.  Iron deficiency anemia, stable Continue iron supplement Hemoglobin 7.1 from 7.4 from 7.8 yesterday No sign of overt bleeding Obtain FOBT if patient has a bowel movement Self-reported last colonoscopy was 2 to 3 years ago with polyps removed Transfuse 1 unit of PRBC if patient is agreeable  Coronary artery disease status post CABG, stable No chest pain C/w home cardiac meds  Hx of PE, stable No hypoxia O2 supplementation if needed to maintain O2 saturation 92% or greater Continue Xarelto  Elevated D-dimer Unclear etiology On Xarelto for previous PE  Elevated lactic acid, resolved  Ambulatory dysfunction PT recommends SNF Social worker consulted Patient declines SNF at this time Fall precautions on oral anticoagulation  Chronic diastolic CHF Appears stable Not in exacerbation Follow-up with cardiology outpatient    Procedures:  None  Consultations:  None  Discharge Exam: BP 124/63 (BP Location: Left Arm)   Pulse 83   Temp 98.6 F (37 C) (Oral)   Resp 14   Ht 6' (1.829 m)   Wt 112.6 kg (248 lb 3.8 oz)   SpO2 93%   BMI 33.67 kg/m    . General: 77 y.o. year-old male well developed well nourished in no acute distress.  Alert and oriented x3. . Cardiovascular: Regular rate and rhythm with no rubs or gallops.  No thyromegaly or JVD noted.   Marland Kitchen Respiratory: Clear to auscultation with no wheezes or rales. Good inspiratory effort. . Abdomen: Soft nontender nondistended with normal bowel  sounds x4 quadrants. . Musculoskeletal: Mild bilateral lower extremity edema. 2/4 pulses in all 4 extremities. . Skin: No ulcerative lesions noted or rashes . Psychiatry: Mood is irritable  Discharge Instructions You were cared for by a hospitalist during your hospital stay. If you have any questions about your discharge medications or the care you received while you were in the hospital after you are discharged, you can call the unit and asked to speak with the hospitalist on call if the hospitalist that took care of you is not available. Once you are discharged, your primary care physician will handle any further medical issues. Please note that NO REFILLS for any discharge medications will be authorized once you are discharged, as it is imperative that you return to your primary care physician (or establish a relationship with a primary care physician if you do not have one) for your aftercare needs so that they can reassess your need for medications and monitor your lab values.   Allergies as of 10/27/2017   No Known Allergies     Medication List    STOP taking these medications   traMADol 50 MG tablet Commonly known as:  ULTRAM     TAKE these medications   allopurinol 300 MG tablet Commonly known as:  ZYLOPRIM Take 300 mg by mouth every morning.   celecoxib 200 MG capsule Commonly known as:  CELEBREX  Take 1 capsule by mouth 2 (two) times daily as needed for moderate pain.   DULoxetine 30 MG capsule Commonly known as:  CYMBALTA Take 30 mg by mouth every morning.   ferrous sulfate 325 (65 FE) MG tablet Take 1 tablet (325 mg total) by mouth daily with breakfast. Start taking on:  10/28/2017   lisinopril 20 MG tablet Commonly known as:  PRINIVIL,ZESTRIL Take 1 tablet (20 mg total) by mouth daily. What changed:  when to take this   polyethylene glycol packet Commonly known as:  MIRALAX / GLYCOLAX Take 17 g by mouth daily.   ranitidine 150 MG tablet Commonly known as:   ZANTAC Take 150 mg by mouth 2 (two) times daily.   XARELTO 20 MG Tabs tablet Generic drug:  rivaroxaban TAKE 1 TABLET BY MOUTH EVERY DAY WITH SUPPER What changed:  See the new instructions.      No Known Allergies Follow-up Information    Deland Pretty, MD Follow up in 3 day(s).   Specialty:  Internal Medicine Why:  please call office for an appointment. Contact information: [address] [place] [address] [phone]        Lelon Perla, MD .   Specialty:  Cardiology Contact information: [address] [place] [address] [phone]        [name], MD. Call.   Specialty:  Urology Why:  call for an appointment. Contact information: [place] [address] [phone]            The results of significant diagnostics from this hospitalization (including imaging, microbiology, ancillary and laboratory) are listed below for reference.    Significant Diagnostic Studies: X-ray Chest Pa And Lateral  Result Date: 10/24/2017 CLINICAL DATA:  Syncope. EXAM: CHEST - 2 VIEW COMPARISON:  10/23/2017, 05/24/2014, 10/02/2010.  CT 05/25/2014. FINDINGS: Prominence of superior mediastinum noted most likely prominent great vessels. Similar findings noted on prior studies. Stable cardiomegaly. No pulmonary venous congestion. Low lung volumes with bibasilar atelectasis/infiltrates again noted. No significant change. No prominent pleural effusion. No pneumothorax. Stable old lower thoracic vertebral body compression fracture. IMPRESSION: 1.  Stable cardiomegaly.  No pulmonary venous congestion. 2.  Low lung volumes with mild bibasilar atelectasis/infiltrates. Electronically Signed   By: Marcello Moores  Register   On: 10/24/2017 09:32   Dg Chest 2 View  Result Date: 10/23/2017 CLINICAL DATA:  Hypotensive EXAM: CHEST - 2 VIEW COMPARISON:  05/26/2014 FINDINGS: Patient rotation limits the exam. There are bibasilar opacities. No pleural  effusion. Heart size within normal limits. Aortic atherosclerosis. Right paratracheal opacity, likely prominent vascular shadow. No pneumothorax. Aortic atherosclerosis. Stable lower thoracic compression. IMPRESSION: 1. Airspace disease at the bilateral lung bases, may reflect atelectasis, pneumonia or aspiration 2. Right paratracheal opacity, suspected to represent prominent vascular shadow, augmented by marked patient rotation. Electronically Signed   By: Donavan Foil M.D.   On: 10/23/2017 20:57    Microbiology: Recent Results (from the past 240 hour(s))  Urine Culture     Status: None   Collection Time: 10/23/17 10:21 PM  Result Value Ref Range Status   Specimen Description URINE, RANDOM  Final   Special Requests NONE  Final   Culture   Final    NO GROWTH Performed at [facility], [address], [place], [place] [address]    Report Status 10/25/2017 FINAL  Final  Culture, blood (Routine X 2) w Reflex to ID Panel     Status: None (Preliminary result)  Collection Time: 10/23/17 11:45 PM  Result Value Ref Range Status   Specimen Description BLOOD RIGHT HAND  Final   Special Requests   Final    BOTTLES DRAWN AEROBIC AND ANAEROBIC Blood Culture adequate volume   Culture   Final    NO GROWTH 2 DAYS Performed at [facility], [address] [place], [place] [address]    Report Status PENDING  Incomplete  Culture, blood (Routine X 2) w Reflex to ID Panel     Status: None (Preliminary result)   Collection Time: 10/23/17 11:50 PM  Result Value Ref Range Status   Specimen Description BLOOD LEFT HAND  Final   Special Requests   Final    BOTTLES DRAWN AEROBIC AND ANAEROBIC Blood Culture adequate volume   Culture   Final    NO GROWTH 2 DAYS Performed at [facility], [place] [address] [place], [place] [address]    Report Status PENDING  Incomplete  MRSA PCR Screening     Status: None   Collection Time: 10/24/17  1:14 AM  Result Value Ref Range Status   MRSA by PCR  NEGATIVE NEGATIVE Final    Comment:        The GeneXpert MRSA Assay (FDA approved for NASAL specimens only), is one component of a comprehensive MRSA colonization surveillance program. It is not intended to diagnose MRSA infection nor to guide or monitor treatment for MRSA infections. Performed at [facility], [place] [address], [place], [place] [address]      Labs: Basic Metabolic Panel: Recent Labs  Lab 10/23/17 2110 10/24/17 0347 10/27/17 0225  NA 136 137 137  K 4.6 4.5 4.0  CL 106 108 105  CO2 18* 21* 24  GLUCOSE 101* 117* 103*  BUN 20 18 18   CREATININE 1.03 0.91 0.85  CALCIUM 8.0* 8.2* 7.9*   Liver Function Tests: Recent Labs  Lab 10/23/17 2110  AST 21  ALT 10*  ALKPHOS 44  BILITOT 0.5  PROT 6.1*  ALBUMIN 3.5   Recent Labs  Lab 10/23/17 2110  LIPASE 25   No results for input(s): AMMONIA in the last 168 hours. CBC: Recent Labs  Lab 10/23/17 2110 10/24/17 0347 10/25/17 0307 10/26/17 0744 10/27/17 0225  WBC 7.4 6.4 6.0 6.0 6.1  NEUTROABS 6.0  --   --   --   --   HGB 7.8* 7.9* 7.8* 7.4* 7.1*  HCT 26.2* 26.7* 26.5* 25.7* 24.2*  MCV 74.0* 73.4* 73.8* 74.9* 74.7*  PLT 253 223 210 197 210   Cardiac Enzymes: Recent Labs  Lab 10/23/17 2110  TROPONINI <0.03   BNP: BNP (last 3 results) No results for input(s): BNP in the last 8760 hours.  ProBNP (last 3 results) Recent Labs    05/06/17 1146  PROBNP 122    CBG: No results for input(s): GLUCAP in the last 168 hours.     Signed:  Kayleen Memos, MD [facility] 10/27/2017, 8:16 AM

## 2017-10-27 NOTE — Progress Notes (Signed)
Pt received one PRBC 1 unit of blood transfusion. Pt c/o abd pain, but he was not sure back pain or abd pain. Administered pain medications. Pt wanted to go to home instead rehab. Pt will be received PT/ OT at home. Removed PIV access. Pt's wife took his belongings. Provided discharge instructions and make sure going to lab work. [name]

## 2017-10-27 NOTE — Progress Notes (Signed)
PT Cancellation Note  Patient Details Name: Eljay Lave MRN: [id number] DOB: [birth year]   Cancelled Treatment:    Reason Eval/Treat Not Completed: Other (comment). Was called into room by case management to discuss d/c recs. Pt very upset with logistics with insurance companies to get home health OT. Pt adamantly refusing HHPT or outpt PT despite education however desires HHOT. Pt reports "i'm to worked up to work with you now." PT to return as able.  Kittie Plater, PT, DPT Pager #: [phone] Office #: [phone]    [place] 10/27/2017, 1:05 PM

## 2017-10-27 NOTE — Care Management Note (Addendum)
Case Management Note  Patient Details  Name: [name] MRN: [id number] Date of Birth: [birth year]  Subjective/Objective:  Pt admitted s/p fall                   Action/Plan:   PTA independent from home with wife.  Pt adamantly refused outpt PT but is interested in [facility] with [place].  Pt also refused rolling walker and rollator (wife has rolling walker in the home).  Pt informed of requirement per insurance that [facility] must be accompanied by another [facility] - pt in agreement with HHPT also with [facility].  Pt has PCP and denied barriers to obtaining and paying for medications as prescribed.  [facility] give referral and referral accepted.  No other CM needs identified at the time this note was written   Expected Discharge Date:  10/27/17               Expected Discharge Plan:  [place]  In-House Referral:     Discharge planning Services  CM Consult  Post Acute Care Choice:    Choice offered to:  Patient, Spouse  DME Arranged:    DME Agency:     HH Arranged:  OT, PT [place] Agency:  [place]  Status of Service:  Completed, signed off  If discussed at [place] of Stay Meetings, dates discussed:    Additional Comments:  Maryclare Labrador, RN 10/27/2017, 3:32 PM

## 2017-10-27 NOTE — Care Management Obs Status (Signed)
[place] NOTIFICATION   Patient Details  Name: [name] MRN: [id number] Date of Birth: 03-15-1941   Medicare Observation Status Notification Given:  Yes    Maryclare Labrador, RN 10/27/2017, 3:24 PM

## 2017-10-28 LAB — BPAM RBC
Blood Product Expiration Date: 201905132359
ISSUE DATE / TIME: 201905061120
Unit Type and Rh: 600

## 2017-10-28 LAB — TYPE AND SCREEN
ABO/RH(D): A NEG
ANTIBODY SCREEN: NEGATIVE
Unit division: 0

## 2017-10-29 LAB — CULTURE, BLOOD (ROUTINE X 2)
Culture: NO GROWTH
Culture: NO GROWTH
SPECIAL REQUESTS: ADEQUATE
Special Requests: ADEQUATE

## 2017-10-29 NOTE — Progress Notes (Signed)
HPI: FU CAD. Pt with h/o unexplained pulmonary embolus years ago treated with one year of Coumadin. Patient was admitted in October of 2013 and found to have a subdural hematoma which required evacuation. Seen for dyspnea 10/15. Cardiac catheterization October 2015 showed an 80% second diagonal but otherwise nonobstructive coronary disease. Ejection fraction 55-65%. Chest CT October 2015 showed bilateral pulmonary emboli. Abdominal ultrasound January 2019 showed 4 cm abdominal aortic aneurysm; follow-up recommended 6 months.  Echocardiogram April 2019 showed normal LV function and mild mitral regurgitation.  Patient admitted with orthostatic hypotension and fall May 2019.  Also noted to be anemic.  Received 1 unit of packed red blood cells.  Since I last saw him, he has dyspnea on exertion unchanged.  No orthopnea, PND, chest pain or syncope.  He has chronic mild pedal edema.  Current Outpatient Medications  Medication Sig Dispense Refill  . allopurinol (ZYLOPRIM) 300 MG tablet Take 300 mg by mouth every morning.     . celecoxib (CELEBREX) 200 MG capsule Take 1 capsule by mouth 2 (two) times daily as needed for moderate pain.     . DULoxetine (CYMBALTA) 30 MG capsule Take 30 mg by mouth every morning.     . ferrous sulfate 325 (65 FE) MG tablet Take 1 tablet (325 mg total) by mouth daily with breakfast. 30 tablet 0  . lisinopril (PRINIVIL,ZESTRIL) 20 MG tablet Take 1 tablet (20 mg total) by mouth daily. (Patient taking differently: Take 20 mg by mouth every morning. ) 90 tablet 3  . polyethylene glycol (MIRALAX / GLYCOLAX) packet Take 17 g by mouth daily. 14 each 0  . ranitidine (ZANTAC) 150 MG tablet Take 150 mg by mouth 2 (two) times daily.     Alveda Reasons 20 MG TABS tablet TAKE 1 TABLET BY MOUTH EVERY DAY WITH SUPPER (Patient taking differently: TAKE 1 TABLET (20mg ) BY MOUTH EVERY DAY WITH SUPPER) 90 tablet 1   No current facility-administered medications for this visit.      Past Medical  History:  Diagnosis Date  . AAA (abdominal aortic aneurysm) ([place]) followed by dr Stanford Breed   last duplex 07-17-2017  3.2 x 3.3  . Anticoagulant long-term use    xarelto for hx PEs and DVT  . Arthritis    back  . Barrett's esophagus 2009  . Bilateral lower extremity edema    chronic pedal  . CAD (coronary artery disease) cardioloigst--  dr Stanford Breed   cardiac cath 04-12-2014  single vessel obstructive cad involving ostrium D2, and other nonobstructive cad , normall lvf  . Chronic lower back pain   . 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[place])    per duplex 07-17-2017  left cia 2 x 2, right cia 2.1 x 2  . Diverticulosis   . Dyspnea on exertion   . FH: colonic polyps   . GERD (gastroesophageal reflux disease)   . Gout    08-27-2017  per pt stable  . History of adenomatous polyp of colon   . History of DVT of lower extremity    10/ 2015  LLE  . History of esophageal stricture    post dilation  . History of gastric polyp    benign  . History of pulmonary embolus (PE)    2005  treated 1 yr w/ coumadin and 10/ 2015  bilateral  . History of radiation therapy 06/22/2012-08/18/2012   78 Gy to prostate (external beam)  . History of subdural hematoma 03/2012   s/p  evacuation  .  Hypertension   . Lesion of bladder   . Prostate cancer [facility]) urologist-  dr wrenn/ oncologist- dr Sondra Come   dx 07/ 2013--  Stage T1c, Gleson 7, vol 67cc--- completed external beam radiation therapy 08-18-2012  . Pulmonary HTN ([place]) 04/12/2014   last echo 05-01-2017  PASP 28mmHg  . RBBB (right bundle branch block with left anterior fascicular block)     Past Surgical History:  Procedure Laterality Date  . CATARACT EXTRACTION W/ INTRAOCULAR LENS  IMPLANT, BILATERAL  2018  . COLONOSCOPY WITH PROPOFOL N/A 04/25/2015   Procedure: COLONOSCOPY WITH PROPOFOL;  Surgeon: Manus Gunning, MD;  Location: [facility];  Service: Gastroenterology;  Laterality: N/A;  . CRANIOTOMY  04/03/2012   Procedure: CRANIOTOMY HEMATOMA  EVACUATION SUBDURAL;  Surgeon: Erline Levine, MD;  Location: [facility];  Service: Neurosurgery;  Laterality: Left;  Left Craniotomy for Evacuation of Subdural Hematoma  . CYSTOSCOPY WITH BIOPSY N/A 09/02/2017   Procedure: CYSTOSCOPY WITH BIOPSY AND FULGURATION POSSIBLE TRANSURETHRAL RESECTION OF BLADDER TUMOR;  Surgeon: [name], MD;  Location: [facility];  Service: Urology;  Laterality: N/A;  . ESOPHAGOGASTRODUODENOSCOPY (EGD) WITH PROPOFOL N/A 04/25/2015   Procedure: ESOPHAGOGASTRODUODENOSCOPY (EGD) WITH PROPOFOL;  Surgeon: Manus Gunning, MD;  Location: [facility];  Service: Gastroenterology;  Laterality: N/A;  . IRRIGATION AND DEBRIDEMENT SEBACEOUS CYST  01-11-13   "off my back"  . LEFT AND RIGHT HEART CATHETERIZATION WITH CORONARY ANGIOGRAM N/A 04/12/2014   Procedure: LEFT AND RIGHT HEART CATHETERIZATION WITH CORONARY ANGIOGRAM;  Surgeon: Peter M Martinique, MD;  Location: [facility] CATH LAB;  Service: Cardiovascular;  Laterality: N/A;  . ORIF FEMUR FRACTURE Left 10-02-2010   dr Alvan Dame   subtrochanteric  . TONSILLECTOMY  1940's  . TRANSTHORACIC ECHOCARDIOGRAM  05/01/2017   mild concentric LVH, ef 66-59%, grade 1 diastolic dysfunction/ trivial AR/ mild dilated ascending aorta/ mild to moderate MR (no stenosis)/ mild LAE/ mild TR/ mild to moderate increase PASP 44mmHg    Social History   Socioeconomic History  . Marital status: Married    Spouse name: Not on file  . Number of children: 3  . Years of education: Not on file  . Highest education level: Not on file  Occupational History  . 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 Financial resource strain: Not on file  . Food insecurity:    Worry: Not on file    Inability: Not on file  . Transportation needs:    Medical: Not on file    Non-medical: Not on file  Tobacco Use  . Smoking status: Former Smoker    Packs/day: 1.00    Years: 20.00    Pack years: 20.00     Types: Cigarettes    Last attempt to quit: 12/23/1971    Years since quitting: 45.9  . Smokeless tobacco: Never Used  Substance and Sexual Activity  . Alcohol use: Yes    Alcohol/week: 8.4 oz    Types: 14 Cans of beer per week    Comment: 2 beer daily  . Drug use: No  . Sexual activity: Yes  Lifestyle  . Physical activity:    Days per week: Not on file    Minutes per session: Not on file  . Stress: Not on file  Relationships  . Social connections:    Talks on phone: Not on file    Gets together: Not on file    Attends religious service: Not on file  Active member of club or organization: Not on file    Attends meetings of clubs or organizations: Not on file    Relationship status: Not on file  . Intimate partner violence:    Fear of current or ex partner: Not on file    Emotionally abused: Not on file    Physically abused: Not on file    Forced sexual activity: Not on file  Other Topics Concern  . Not on file  Social History Narrative   Originally from [place].  Retired to [employer] because he was tired of all the snow.    Family History  Problem Relation Age of Onset  . Macular degeneration Mother   . Osteoarthritis Mother   . Aneurysm Father        d/o 19 yo, ruptured AAA  . Heart disease Unknown        No family history  . Colon cancer Neg Hx   . Colon polyps Neg Hx   . Diabetes Neg Hx     ROS: Back pain but no fevers or chills, productive cough, hemoptysis, dysphasia, odynophagia, melena, hematochezia, dysuria, hematuria, rash, seizure activity, orthopnea, PND, pedal edema, claudication. Remaining systems are negative.  Physical Exam: Well-developed well-nourished in no acute distress.  Skin is warm and dry.  HEENT is normal.  Neck is supple.  Chest is clear to auscultation with normal expansion.  Cardiovascular exam is regular rate and rhythm.  Abdominal exam nontender or distended. No masses palpated. Extremities show trace edema. neuro grossly  intact  ECG-normal sinus rhythm with occasional PVC.  Right bundle branch block.  Inferior lateral T wave inversion.  Personally reviewed  A/P  1 coronary artery disease-patient continues to decline statins.  He is not on aspirin given need for chronic anticoagulation.  2 history of recurrent pulmonary embolus-continue Xarelto.  He will need lifelong anticoagulation.  3 abdominal aortic aneurysm-patient will need follow-up abdominal ultrasound July 2019.  4 hypertension-patient recently seen for orthostatic hypotension.  Blood pressure has improved and they follow this closely at home.  Continue present medications and adjust as needed.  5 dyspnea-patient has chronic dyspnea which is felt likely secondary to residual effects from prior pulmonary emboli.  Kirk Ruths, MD

## 2017-10-30 DIAGNOSIS — Z8546 Personal history of malignant neoplasm of prostate: Secondary | ICD-10-CM | POA: Diagnosis not present

## 2017-10-30 DIAGNOSIS — N3040 Irradiation cystitis without hematuria: Secondary | ICD-10-CM | POA: Diagnosis not present

## 2017-10-30 DIAGNOSIS — R3912 Poor urinary stream: Secondary | ICD-10-CM | POA: Diagnosis not present

## 2017-10-31 DIAGNOSIS — K2270 Barrett's esophagus without dysplasia: Secondary | ICD-10-CM | POA: Diagnosis not present

## 2017-10-31 DIAGNOSIS — I2510 Atherosclerotic heart disease of native coronary artery without angina pectoris: Secondary | ICD-10-CM | POA: Diagnosis not present

## 2017-10-31 DIAGNOSIS — K219 Gastro-esophageal reflux disease without esophagitis: Secondary | ICD-10-CM | POA: Diagnosis not present

## 2017-10-31 DIAGNOSIS — I719 Aortic aneurysm of unspecified site, without rupture: Secondary | ICD-10-CM | POA: Diagnosis not present

## 2017-10-31 DIAGNOSIS — Z7901 Long term (current) use of anticoagulants: Secondary | ICD-10-CM | POA: Diagnosis not present

## 2017-10-31 DIAGNOSIS — Z8546 Personal history of malignant neoplasm of prostate: Secondary | ICD-10-CM | POA: Diagnosis not present

## 2017-10-31 DIAGNOSIS — Z5181 Encounter for therapeutic drug level monitoring: Secondary | ICD-10-CM | POA: Diagnosis not present

## 2017-10-31 DIAGNOSIS — M109 Gout, unspecified: Secondary | ICD-10-CM | POA: Diagnosis not present

## 2017-10-31 DIAGNOSIS — M549 Dorsalgia, unspecified: Secondary | ICD-10-CM | POA: Diagnosis not present

## 2017-10-31 DIAGNOSIS — I2699 Other pulmonary embolism without acute cor pulmonale: Secondary | ICD-10-CM | POA: Diagnosis not present

## 2017-10-31 DIAGNOSIS — I272 Pulmonary hypertension, unspecified: Secondary | ICD-10-CM | POA: Diagnosis not present

## 2017-10-31 DIAGNOSIS — G8929 Other chronic pain: Secondary | ICD-10-CM | POA: Diagnosis not present

## 2017-10-31 DIAGNOSIS — Z86718 Personal history of other venous thrombosis and embolism: Secondary | ICD-10-CM | POA: Diagnosis not present

## 2017-10-31 DIAGNOSIS — M1991 Primary osteoarthritis, unspecified site: Secondary | ICD-10-CM | POA: Diagnosis not present

## 2017-10-31 DIAGNOSIS — Z791 Long term (current) use of non-steroidal anti-inflammatories (NSAID): Secondary | ICD-10-CM | POA: Diagnosis not present

## 2017-10-31 DIAGNOSIS — D509 Iron deficiency anemia, unspecified: Secondary | ICD-10-CM | POA: Diagnosis not present

## 2017-10-31 DIAGNOSIS — I10 Essential (primary) hypertension: Secondary | ICD-10-CM | POA: Diagnosis not present

## 2017-11-04 ENCOUNTER — Other Ambulatory Visit (HOSPITAL_COMMUNITY)
Admission: AD | Admit: 2017-11-04 | Discharge: 2017-11-04 | Disposition: A | Discharge status: Home / Self Care | Payer: PPO | Source: Skilled Nursing Facility | Attending: Internal Medicine | Admitting: Internal Medicine

## 2017-11-04 DIAGNOSIS — D649 Anemia, unspecified: Secondary | ICD-10-CM | POA: Diagnosis not present

## 2017-11-04 LAB — CBC WITH DIFFERENTIAL/PLATELET
BASOS PCT: 1 %
Basophils Absolute: 0 10*3/uL (ref 0.0–0.1)
EOS ABS: 0.2 10*3/uL (ref 0.0–0.7)
Eosinophils Relative: 4 %
HCT: 27.8 % — ABNORMAL LOW (ref 39.0–52.0)
Hemoglobin: 8.4 g/dL — ABNORMAL LOW (ref 13.0–17.0)
LYMPHS ABS: 0.8 10*3/uL (ref 0.7–4.0)
LYMPHS PCT: 18 %
MCH: 22.8 pg — ABNORMAL LOW (ref 26.0–34.0)
MCHC: 30.2 g/dL (ref 30.0–36.0)
MCV: 75.3 fL — ABNORMAL LOW (ref 78.0–100.0)
Monocytes Absolute: 0.4 10*3/uL (ref 0.1–1.0)
Monocytes Relative: 9 %
NEUTROS ABS: 3.1 10*3/uL (ref 1.7–7.7)
NEUTROS PCT: 68 %
Platelets: 339 10*3/uL (ref 150–400)
RBC: 3.69 MIL/uL — AB (ref 4.22–5.81)
RDW: 19.6 % — ABNORMAL HIGH (ref 11.5–15.5)
WBC: 4.6 10*3/uL (ref 4.0–10.5)

## 2017-11-04 LAB — PROTIME-INR
INR: 1.71
PROTHROMBIN TIME: 19.9 s — AB (ref 11.4–15.2)

## 2017-11-04 LAB — APTT: aPTT: 36 seconds (ref 24–36)

## 2017-11-10 ENCOUNTER — Encounter: Payer: Self-pay | Admitting: Cardiology

## 2017-11-10 ENCOUNTER — Ambulatory Visit: Payer: PPO | Admitting: Cardiology

## 2017-11-10 VITALS — BP 136/84 | HR 82 | Ht 72.0 in | Wt 222.0 lb

## 2017-11-10 DIAGNOSIS — I714 Abdominal aortic aneurysm, without rupture, unspecified: Secondary | ICD-10-CM

## 2017-11-10 DIAGNOSIS — R0600 Dyspnea, unspecified: Secondary | ICD-10-CM

## 2017-11-10 DIAGNOSIS — I2510 Atherosclerotic heart disease of native coronary artery without angina pectoris: Secondary | ICD-10-CM | POA: Diagnosis not present

## 2017-11-10 DIAGNOSIS — I10 Essential (primary) hypertension: Secondary | ICD-10-CM

## 2017-11-10 NOTE — Patient Instructions (Signed)
Medication Instructions:  Your physician recommends that you continue on your current medications as directed. Please refer to the Current Medication list given to you today.  Labwork: None  Testing/Procedures: Please schedule abdominal ultrasound   Follow-Up: Your physician wants you to follow-up in: 12 months with Dr Stanford Breed. You will receive a reminder letter in the mail two months in advance. If you don't receive a letter, please call our office to schedule the follow-up appointment.  Any Other Special Instructions Will Be Listed Below (If Applicable).  If you need a refill on your cardiac medications before your next appointment, please call your pharmacy.

## 2017-11-13 ENCOUNTER — Encounter: Payer: Self-pay | Admitting: Cardiology

## 2017-11-13 DIAGNOSIS — Z7901 Long term (current) use of anticoagulants: Secondary | ICD-10-CM | POA: Diagnosis not present

## 2017-11-13 DIAGNOSIS — Z8546 Personal history of malignant neoplasm of prostate: Secondary | ICD-10-CM | POA: Diagnosis not present

## 2017-11-13 DIAGNOSIS — I2510 Atherosclerotic heart disease of native coronary artery without angina pectoris: Secondary | ICD-10-CM | POA: Diagnosis not present

## 2017-11-13 DIAGNOSIS — I719 Aortic aneurysm of unspecified site, without rupture: Secondary | ICD-10-CM | POA: Diagnosis not present

## 2017-11-13 DIAGNOSIS — I272 Pulmonary hypertension, unspecified: Secondary | ICD-10-CM | POA: Diagnosis not present

## 2017-11-13 DIAGNOSIS — M549 Dorsalgia, unspecified: Secondary | ICD-10-CM | POA: Diagnosis not present

## 2017-11-13 DIAGNOSIS — Z5181 Encounter for therapeutic drug level monitoring: Secondary | ICD-10-CM | POA: Diagnosis not present

## 2017-11-13 DIAGNOSIS — M109 Gout, unspecified: Secondary | ICD-10-CM | POA: Diagnosis not present

## 2017-11-13 DIAGNOSIS — Z86718 Personal history of other venous thrombosis and embolism: Secondary | ICD-10-CM | POA: Diagnosis not present

## 2017-11-13 DIAGNOSIS — Z791 Long term (current) use of non-steroidal anti-inflammatories (NSAID): Secondary | ICD-10-CM | POA: Diagnosis not present

## 2017-11-13 DIAGNOSIS — K219 Gastro-esophageal reflux disease without esophagitis: Secondary | ICD-10-CM | POA: Diagnosis not present

## 2017-11-13 DIAGNOSIS — K2270 Barrett's esophagus without dysplasia: Secondary | ICD-10-CM | POA: Diagnosis not present

## 2017-11-13 DIAGNOSIS — I10 Essential (primary) hypertension: Secondary | ICD-10-CM | POA: Diagnosis not present

## 2017-11-13 DIAGNOSIS — M1991 Primary osteoarthritis, unspecified site: Secondary | ICD-10-CM | POA: Diagnosis not present

## 2017-11-13 DIAGNOSIS — I2699 Other pulmonary embolism without acute cor pulmonale: Secondary | ICD-10-CM | POA: Diagnosis not present

## 2017-11-13 DIAGNOSIS — D509 Iron deficiency anemia, unspecified: Secondary | ICD-10-CM | POA: Diagnosis not present

## 2017-11-13 DIAGNOSIS — G8929 Other chronic pain: Secondary | ICD-10-CM | POA: Diagnosis not present

## 2017-11-25 ENCOUNTER — Other Ambulatory Visit (HOSPITAL_COMMUNITY): Payer: PPO

## 2017-12-10 DIAGNOSIS — M545 Low back pain: Secondary | ICD-10-CM | POA: Diagnosis not present

## 2017-12-10 DIAGNOSIS — S32010A Wedge compression fracture of first lumbar vertebra, initial encounter for closed fracture: Secondary | ICD-10-CM | POA: Diagnosis not present

## 2017-12-11 ENCOUNTER — Other Ambulatory Visit: Payer: Self-pay | Admitting: Anesthesiology

## 2017-12-11 DIAGNOSIS — S32010A Wedge compression fracture of first lumbar vertebra, initial encounter for closed fracture: Secondary | ICD-10-CM

## 2017-12-12 ENCOUNTER — Other Ambulatory Visit (HOSPITAL_COMMUNITY): Payer: Self-pay | Admitting: Anesthesiology

## 2017-12-12 DIAGNOSIS — S32010A Wedge compression fracture of first lumbar vertebra, initial encounter for closed fracture: Secondary | ICD-10-CM

## 2017-12-16 ENCOUNTER — Ambulatory Visit (HOSPITAL_COMMUNITY)
Admission: RE | Admit: 2017-12-16 | Discharge: 2017-12-16 | Disposition: A | Discharge status: Home / Self Care | Payer: PPO | Source: Ambulatory Visit | Attending: Anesthesiology | Admitting: Anesthesiology

## 2017-12-16 DIAGNOSIS — M5136 Other intervertebral disc degeneration, lumbar region: Secondary | ICD-10-CM | POA: Insufficient documentation

## 2017-12-16 DIAGNOSIS — M4854XS Collapsed vertebra, not elsewhere classified, thoracic region, sequela of fracture: Secondary | ICD-10-CM | POA: Insufficient documentation

## 2017-12-16 DIAGNOSIS — X58XXXA Exposure to other specified factors, initial encounter: Secondary | ICD-10-CM | POA: Diagnosis not present

## 2017-12-16 DIAGNOSIS — S32010A Wedge compression fracture of first lumbar vertebra, initial encounter for closed fracture: Secondary | ICD-10-CM | POA: Diagnosis not present

## 2017-12-18 DIAGNOSIS — S32010G Wedge compression fracture of first lumbar vertebra, subsequent encounter for fracture with delayed healing: Secondary | ICD-10-CM | POA: Diagnosis not present

## 2017-12-18 DIAGNOSIS — M545 Low back pain: Secondary | ICD-10-CM | POA: Diagnosis not present

## 2017-12-18 DIAGNOSIS — Z6832 Body mass index (BMI) 32.0-32.9, adult: Secondary | ICD-10-CM | POA: Diagnosis not present

## 2017-12-18 DIAGNOSIS — M4015 Other secondary kyphosis, thoracolumbar region: Secondary | ICD-10-CM | POA: Diagnosis not present

## 2017-12-19 ENCOUNTER — Other Ambulatory Visit: Payer: Self-pay | Admitting: Neurosurgery

## 2017-12-19 ENCOUNTER — Telehealth: Payer: Self-pay

## 2017-12-19 NOTE — Telephone Encounter (Signed)
Patient takes lifelong Xarelto for recurrent PE and DVT, most recently in 2015. Renal function is normal. Typically hold Xarelto for 3 days prior to spinal procedure, however pt would require Lovenox bridging if he were taking warfarin due to recurrent VTE events. Will route to Dr Stanford Breed regarding periprocedural anticoagulation management.

## 2017-12-19 NOTE — Telephone Encounter (Signed)
   [facility] Pre-operative Risk Assessment    Request for surgical clearance:  1. What type of surgery is being performed?   Lumbar 1 KYPHOPLASTY  2. When is this surgery scheduled?   12/23/2017  3. What type of clearance is required (medical clearance vs. Pharmacy clearance to hold med vs. Both)? BOTH  4. Are there any medications that need to be held prior to surgery and how long? Anton   5. Practice name and name of physician performing surgery?   [facility] Er[facility] Levine, MD   6. What is your office phone number [phone]    7.   What is your office fax number [phone]  8.   Anesthesia type (None, local, MAC, general) ?  General   Waylan Rocher 12/19/2017, 11:30 AM  _________________________________________________________________   (provider comments below)

## 2017-12-19 NOTE — Telephone Encounter (Signed)
Hold xarelto 3 days prior to procedure and resume after when ok with neurosurgery Kirk Ruths

## 2017-12-19 NOTE — Telephone Encounter (Signed)
   Primary Cardiologist: Kirk Ruths, MD  Chart reviewed as part of pre-operative protocol coverage. Patient was contacted 12/19/2017 in reference to pre-operative risk assessment for pending surgery as outlined below.  [name] was last seen on 11/10/2017 by Dr. Stanford Breed.  Since that day, Kage Willmann has done well.  I contacted the patient by phone, his respiratory status is at baseline and he is having no chest pain.  Therefore, based on ACC/AHA guidelines, the patient would be at acceptable risk for the planned procedure without further cardiovascular testing.   **I will route this to the pharmacy pool to address holding the Eliquis.**  Once I have their recommendations, I will route this recommendation to the requesting party via Epic fax function and remove from pre-op pool.  Please call with questions.  Rosaria Ferries, PA-C 12/19/2017, 11:40 AM

## 2017-12-19 NOTE — Telephone Encounter (Signed)
    Dr. Stanford Breed has authorized holding Xarelto for 3 days prior to the procedure and resuming afterwards when okay with neurosurgery.  The patient was contacted by phone and is stable from a cardiac standpoint.  He is at acceptable risk for the planned procedure without further cardiac work-up.  Rosaria Ferries, PA-C 12/19/2017 1:06 PM Beeper [phone]  [facility] Erline Levine, MD   office phone number [phone]    office fax number [phone]

## 2017-12-22 ENCOUNTER — Encounter (HOSPITAL_COMMUNITY): Payer: Self-pay | Admitting: *Deleted

## 2017-12-22 ENCOUNTER — Other Ambulatory Visit: Payer: Self-pay

## 2017-12-22 NOTE — H&P (View-Only) (Signed)
   12/22/17 1357  OBSTRUCTIVE SLEEP APNEA  Have you ever been diagnosed with sleep apnea through a sleep study? No  Do you snore loudly (loud enough to be heard through closed doors)?  1  Do you often feel tired, fatigued, or sleepy during the daytime (such as falling asleep during driving or talking to someone)? 0  Has anyone observed you stop breathing during your sleep? 0  Do you have, or are you being treated for high blood pressure? 1  BMI more than 35 kg/m2? 1  Age > 50 (1-yes) 1  Neck circumference greater than:Male 16 inches or larger, Male 17inches or larger? 1  Male Gender (Yes=1) 1  Obstructive Sleep Apnea Score 6

## 2017-12-22 NOTE — Progress Notes (Signed)
   12/22/17 1357  OBSTRUCTIVE SLEEP APNEA  Have you ever been diagnosed with sleep apnea through a sleep study? No  Do you snore loudly (loud enough to be heard through closed doors)?  1  Do you often feel tired, fatigued, or sleepy during the daytime (such as falling asleep during driving or talking to someone)? 0  Has anyone observed you stop breathing during your sleep? 0  Do you have, or are you being treated for high blood pressure? 1  BMI more than 35 kg/m2? 1  Age > 50 (1-yes) 1  Neck circumference greater than:Male 16 inches or larger, Male 17inches or larger? 1  Male Gender (Yes=1) 1  Obstructive Sleep Apnea Score 6

## 2017-12-23 ENCOUNTER — Ambulatory Visit (HOSPITAL_COMMUNITY): Payer: PPO

## 2017-12-23 ENCOUNTER — Encounter (HOSPITAL_COMMUNITY): Payer: Self-pay | Admitting: Surgery

## 2017-12-23 ENCOUNTER — Ambulatory Visit (HOSPITAL_COMMUNITY): Payer: PPO | Admitting: Anesthesiology

## 2017-12-23 ENCOUNTER — Encounter (HOSPITAL_COMMUNITY)
Admission: RE | Disposition: A | Discharge status: Home / Self Care | Payer: Self-pay | Source: Ambulatory Visit | Attending: Neurosurgery

## 2017-12-23 ENCOUNTER — Observation Stay (HOSPITAL_COMMUNITY)
Admission: RE | Admit: 2017-12-23 | Discharge: 2017-12-24 | Disposition: A | Discharge status: Home / Self Care | Payer: PPO | Source: Ambulatory Visit | Attending: Neurosurgery | Admitting: Neurosurgery

## 2017-12-23 DIAGNOSIS — S32010A Wedge compression fracture of first lumbar vertebra, initial encounter for closed fracture: Secondary | ICD-10-CM | POA: Diagnosis not present

## 2017-12-23 DIAGNOSIS — K219 Gastro-esophageal reflux disease without esophagitis: Secondary | ICD-10-CM | POA: Diagnosis not present

## 2017-12-23 DIAGNOSIS — I10 Essential (primary) hypertension: Secondary | ICD-10-CM | POA: Diagnosis not present

## 2017-12-23 DIAGNOSIS — Y929 Unspecified place or not applicable: Secondary | ICD-10-CM | POA: Diagnosis not present

## 2017-12-23 DIAGNOSIS — R262 Difficulty in walking, not elsewhere classified: Secondary | ICD-10-CM | POA: Insufficient documentation

## 2017-12-23 DIAGNOSIS — Z87891 Personal history of nicotine dependence: Secondary | ICD-10-CM | POA: Insufficient documentation

## 2017-12-23 DIAGNOSIS — I2510 Atherosclerotic heart disease of native coronary artery without angina pectoris: Secondary | ICD-10-CM | POA: Diagnosis not present

## 2017-12-23 DIAGNOSIS — G4733 Obstructive sleep apnea (adult) (pediatric): Secondary | ICD-10-CM | POA: Diagnosis not present

## 2017-12-23 DIAGNOSIS — Z79899 Other long term (current) drug therapy: Secondary | ICD-10-CM | POA: Diagnosis not present

## 2017-12-23 DIAGNOSIS — S32000A Wedge compression fracture of unspecified lumbar vertebra, initial encounter for closed fracture: Secondary | ICD-10-CM | POA: Diagnosis present

## 2017-12-23 DIAGNOSIS — W19XXXA Unspecified fall, initial encounter: Secondary | ICD-10-CM | POA: Diagnosis not present

## 2017-12-23 DIAGNOSIS — Z981 Arthrodesis status: Secondary | ICD-10-CM | POA: Diagnosis not present

## 2017-12-23 DIAGNOSIS — Z419 Encounter for procedure for purposes other than remedying health state, unspecified: Secondary | ICD-10-CM

## 2017-12-23 HISTORY — DX: Dyspnea, unspecified: R06.00

## 2017-12-23 HISTORY — PX: KYPHOPLASTY: SHX5884

## 2017-12-23 LAB — CBC
HCT: 42.4 % (ref 39.0–52.0)
Hemoglobin: 12.9 g/dL — ABNORMAL LOW (ref 13.0–17.0)
MCH: 26.4 pg (ref 26.0–34.0)
MCHC: 30.4 g/dL (ref 30.0–36.0)
MCV: 86.9 fL (ref 78.0–100.0)
PLATELETS: 207 10*3/uL (ref 150–400)
RBC: 4.88 MIL/uL (ref 4.22–5.81)
RDW: 27 % — AB (ref 11.5–15.5)
WBC: 4.2 10*3/uL (ref 4.0–10.5)

## 2017-12-23 LAB — BASIC METABOLIC PANEL
Anion gap: 9 (ref 5–15)
BUN: 18 mg/dL (ref 8–23)
CALCIUM: 8.7 mg/dL — AB (ref 8.9–10.3)
CO2: 25 mmol/L (ref 22–32)
CREATININE: 0.95 mg/dL (ref 0.61–1.24)
Chloride: 106 mmol/L (ref 98–111)
GFR calc Af Amer: >60 mL/min (ref >60)
GFR calc non Af Amer: >60 mL/min (ref >60)
Glucose, Bld: 105 mg/dL — ABNORMAL HIGH (ref 70–99)
Potassium: 4.5 mmol/L (ref 3.5–5.1)
SODIUM: 140 mmol/L (ref 135–145)

## 2017-12-23 LAB — PROTIME-INR
INR: 0.99
PROTHROMBIN TIME: 13 s (ref 11.4–15.2)

## 2017-12-23 SURGERY — KYPHOPLASTY
Anesthesia: General

## 2017-12-23 MED ORDER — LIDOCAINE 2% (20 MG/ML) 5 ML SYRINGE
INTRAMUSCULAR | Status: DC | PRN
  Administered 2017-12-23: 30 mg via INTRAVENOUS

## 2017-12-23 MED ORDER — BUPIVACAINE HCL (PF) 0.5 % IJ SOLN
INTRAMUSCULAR | Status: AC
  Filled 2017-12-23: qty 30

## 2017-12-23 MED ORDER — LACTATED RINGERS IV SOLN
INTRAVENOUS | Status: DC
  Administered 2017-12-23: 13:00:00 via INTRAVENOUS

## 2017-12-23 MED ORDER — CHLORHEXIDINE GLUCONATE CLOTH 2 % EX PADS: 6.0000 | MEDICATED_PAD | Freq: Once | CUTANEOUS | Status: DC

## 2017-12-23 MED ORDER — DEXTROSE 5 % IV SOLN
INTRAVENOUS | Status: DC | PRN
  Administered 2017-12-23: 50 ug/min via INTRAVENOUS

## 2017-12-23 MED ORDER — METHOCARBAMOL 500 MG PO TABS
500.0000 mg | ORAL_TABLET | Freq: Four times a day (QID) | ORAL | Status: DC | PRN
  Administered 2017-12-23: 500 mg via ORAL

## 2017-12-23 MED ORDER — FENTANYL CITRATE (PF) 100 MCG/2ML IJ SOLN
INTRAMUSCULAR | Status: DC | PRN
  Administered 2017-12-23: 50 ug via INTRAVENOUS

## 2017-12-23 MED ORDER — DOCUSATE SODIUM 100 MG PO CAPS
100.0000 mg | ORAL_CAPSULE | Freq: Two times a day (BID) | ORAL | Status: DC
  Administered 2017-12-23: 100 mg via ORAL
  Filled 2017-12-23: qty 1

## 2017-12-23 MED ORDER — SODIUM CHLORIDE 0.9% FLUSH: 3.0000 mL | INTRAVENOUS | Status: DC | PRN

## 2017-12-23 MED ORDER — BISACODYL 10 MG RE SUPP: 10.0000 mg | Freq: Every day | RECTAL | Status: DC | PRN

## 2017-12-23 MED ORDER — FAMOTIDINE 20 MG PO TABS
20.0000 mg | ORAL_TABLET | Freq: Two times a day (BID) | ORAL | Status: DC
  Administered 2017-12-23: 20 mg via ORAL
  Filled 2017-12-23: qty 1

## 2017-12-23 MED ORDER — PHENYLEPHRINE 40 MCG/ML (10ML) SYRINGE FOR IV PUSH (FOR BLOOD PRESSURE SUPPORT)
PREFILLED_SYRINGE | INTRAVENOUS | Status: DC | PRN
  Administered 2017-12-23 (×3): 80 ug via INTRAVENOUS

## 2017-12-23 MED ORDER — OXYCODONE HCL 5 MG PO TABS: 5.0000 mg | ORAL_TABLET | ORAL | Status: DC | PRN

## 2017-12-23 MED ORDER — SUGAMMADEX SODIUM 200 MG/2ML IV SOLN
INTRAVENOUS | Status: DC | PRN
  Administered 2017-12-23: 200 mg via INTRAVENOUS

## 2017-12-23 MED ORDER — LABETALOL HCL 5 MG/ML IV SOLN: 5.0000 mg | INTRAVENOUS | Status: DC | PRN

## 2017-12-23 MED ORDER — POLYETHYLENE GLYCOL 3350 17 G PO PACK: 17.0000 g | PACK | Freq: Every day | ORAL | Status: DC

## 2017-12-23 MED ORDER — FLEET ENEMA 7-19 GM/118ML RE ENEM: 1.0000 | ENEMA | Freq: Once | RECTAL | Status: DC | PRN

## 2017-12-23 MED ORDER — LIDOCAINE-EPINEPHRINE 1 %-1:100000 IJ SOLN
INTRAMUSCULAR | Status: DC | PRN
  Administered 2017-12-23: 3 mL

## 2017-12-23 MED ORDER — ONDANSETRON HCL 4 MG/2ML IJ SOLN: 4.0000 mg | Freq: Once | INTRAMUSCULAR | Status: DC | PRN

## 2017-12-23 MED ORDER — 0.9 % SODIUM CHLORIDE (POUR BTL) OPTIME
TOPICAL | Status: DC | PRN
  Administered 2017-12-23: 3000 mL

## 2017-12-23 MED ORDER — CEFAZOLIN SODIUM-DEXTROSE 2-4 GM/100ML-% IV SOLN
2.0000 g | Freq: Three times a day (TID) | INTRAVENOUS | Status: DC
  Administered 2017-12-23: 2 g via INTRAVENOUS
  Filled 2017-12-23: qty 100

## 2017-12-23 MED ORDER — IOPAMIDOL (ISOVUE-300) INJECTION 61%
INTRAVENOUS | Status: AC
  Filled 2017-12-23: qty 50

## 2017-12-23 MED ORDER — HYDROCODONE-ACETAMINOPHEN 7.5-325 MG PO TABS
1.0000 | ORAL_TABLET | Freq: Four times a day (QID) | ORAL | Status: DC | PRN
Start: 2017-12-23 — End: 2017-12-24
  Administered 2017-12-23 – 2017-12-24 (×2): 1 via ORAL
  Filled 2017-12-23 (×2): qty 1

## 2017-12-23 MED ORDER — OXYCODONE HCL 5 MG PO TABS
ORAL_TABLET | ORAL | Status: AC
  Filled 2017-12-23: qty 1

## 2017-12-23 MED ORDER — CELECOXIB 200 MG PO CAPS: 200.0000 mg | ORAL_CAPSULE | Freq: Two times a day (BID) | ORAL | Status: DC | PRN

## 2017-12-23 MED ORDER — OXYCODONE HCL 5 MG PO TABS
5.0000 mg | ORAL_TABLET | Freq: Once | ORAL | Status: AC | PRN
  Administered 2017-12-23: 5 mg via ORAL

## 2017-12-23 MED ORDER — BUPIVACAINE HCL (PF) 0.5 % IJ SOLN
INTRAMUSCULAR | Status: DC | PRN
  Administered 2017-12-23: 3 mL

## 2017-12-23 MED ORDER — PROPOFOL 10 MG/ML IV BOLUS
INTRAVENOUS | Status: DC | PRN
  Administered 2017-12-23: 100 mg via INTRAVENOUS

## 2017-12-23 MED ORDER — ALUM & MAG HYDROXIDE-SIMETH 200-200-20 MG/5ML PO SUSP: 30.0000 mL | Freq: Four times a day (QID) | ORAL | Status: DC | PRN

## 2017-12-23 MED ORDER — ONDANSETRON HCL 4 MG/2ML IJ SOLN: 4.0000 mg | Freq: Four times a day (QID) | INTRAMUSCULAR | Status: DC | PRN

## 2017-12-23 MED ORDER — METHOCARBAMOL 1000 MG/10ML IJ SOLN
500.0000 mg | Freq: Four times a day (QID) | INTRAVENOUS | Status: DC | PRN
  Filled 2017-12-23: qty 5

## 2017-12-23 MED ORDER — ACETAMINOPHEN 650 MG RE SUPP: 650.0000 mg | RECTAL | Status: DC | PRN

## 2017-12-23 MED ORDER — CEFAZOLIN SODIUM-DEXTROSE 2-4 GM/100ML-% IV SOLN
2.0000 g | INTRAVENOUS | Status: AC
  Administered 2017-12-23: 2 g via INTRAVENOUS

## 2017-12-23 MED ORDER — SODIUM CHLORIDE 0.9% FLUSH: 3.0000 mL | Freq: Two times a day (BID) | INTRAVENOUS | Status: DC

## 2017-12-23 MED ORDER — MENTHOL 3 MG MT LOZG: 1.0000 | LOZENGE | OROMUCOSAL | Status: DC | PRN

## 2017-12-23 MED ORDER — CEFAZOLIN SODIUM-DEXTROSE 2-4 GM/100ML-% IV SOLN
INTRAVENOUS | Status: AC
  Filled 2017-12-23: qty 100

## 2017-12-23 MED ORDER — DEXAMETHASONE SODIUM PHOSPHATE 10 MG/ML IJ SOLN
INTRAMUSCULAR | Status: DC | PRN
  Administered 2017-12-23: 10 mg via INTRAVENOUS

## 2017-12-23 MED ORDER — DULOXETINE HCL 30 MG PO CPEP: 30.0000 mg | ORAL_CAPSULE | Freq: Every morning | ORAL | Status: DC

## 2017-12-23 MED ORDER — PROPOFOL 10 MG/ML IV BOLUS
INTRAVENOUS | Status: AC
  Filled 2017-12-23: qty 20

## 2017-12-23 MED ORDER — IOPAMIDOL (ISOVUE-300) INJECTION 61%
INTRAVENOUS | Status: DC | PRN
  Administered 2017-12-23: 50 mL

## 2017-12-23 MED ORDER — OXYCODONE HCL 5 MG PO TABS: 10.0000 mg | ORAL_TABLET | ORAL | Status: DC | PRN

## 2017-12-23 MED ORDER — LIDOCAINE-EPINEPHRINE 1 %-1:100000 IJ SOLN
INTRAMUSCULAR | Status: AC
  Filled 2017-12-23: qty 1

## 2017-12-23 MED ORDER — OXYCODONE HCL 5 MG/5ML PO SOLN: 5.0000 mg | Freq: Once | ORAL | Status: AC | PRN

## 2017-12-23 MED ORDER — SODIUM CHLORIDE 0.9 % IV SOLN: 250.0000 mL | INTRAVENOUS | Status: DC

## 2017-12-23 MED ORDER — ONDANSETRON HCL 4 MG PO TABS: 4.0000 mg | ORAL_TABLET | Freq: Four times a day (QID) | ORAL | Status: DC | PRN

## 2017-12-23 MED ORDER — ZOLPIDEM TARTRATE 5 MG PO TABS: 5.0000 mg | ORAL_TABLET | Freq: Every evening | ORAL | Status: DC | PRN

## 2017-12-23 MED ORDER — LISINOPRIL 20 MG PO TABS
20.0000 mg | ORAL_TABLET | Freq: Every day | ORAL | Status: DC
  Administered 2017-12-23: 20 mg via ORAL
  Filled 2017-12-23: qty 1

## 2017-12-23 MED ORDER — MORPHINE SULFATE (PF) 2 MG/ML IV SOLN: 2.0000 mg | INTRAVENOUS | Status: DC | PRN

## 2017-12-23 MED ORDER — POLYETHYLENE GLYCOL 3350 17 G PO PACK
17.0000 g | PACK | Freq: Every day | ORAL | Status: DC | PRN
Start: 2017-12-23 — End: 2017-12-24

## 2017-12-23 MED ORDER — FERROUS SULFATE 325 (65 FE) MG PO TABS: 325.0000 mg | ORAL_TABLET | Freq: Every day | ORAL | Status: DC

## 2017-12-23 MED ORDER — FENTANYL CITRATE (PF) 250 MCG/5ML IJ SOLN
INTRAMUSCULAR | Status: AC
  Filled 2017-12-23: qty 5

## 2017-12-23 MED ORDER — KCL IN DEXTROSE-NACL 20-5-0.45 MEQ/L-%-% IV SOLN: INTRAVENOUS | Status: DC

## 2017-12-23 MED ORDER — FENTANYL CITRATE (PF) 100 MCG/2ML IJ SOLN: 25.0000 ug | INTRAMUSCULAR | Status: DC | PRN

## 2017-12-23 MED ORDER — PHENOL 1.4 % MT LIQD: 1.0000 | OROMUCOSAL | Status: DC | PRN

## 2017-12-23 MED ORDER — ACETAMINOPHEN 325 MG PO TABS: 650.0000 mg | ORAL_TABLET | ORAL | Status: DC | PRN

## 2017-12-23 MED ORDER — ALLOPURINOL 300 MG PO TABS
300.0000 mg | ORAL_TABLET | Freq: Every morning | ORAL | Status: DC
  Filled 2017-12-23: qty 1

## 2017-12-23 MED ORDER — METHOCARBAMOL 500 MG PO TABS
ORAL_TABLET | ORAL | Status: AC
  Filled 2017-12-23: qty 1

## 2017-12-23 MED ORDER — ROCURONIUM BROMIDE 50 MG/5ML IV SOSY
PREFILLED_SYRINGE | INTRAVENOUS | Status: DC | PRN
  Administered 2017-12-23: 50 mg via INTRAVENOUS

## 2017-12-23 SURGICAL SUPPLY — 42 items
ADH SKN CLS APL DERMABOND .7 (GAUZE/BANDAGES/DRESSINGS) ×1
BLADE CLIPPER SURG (BLADE) IMPLANT
BLADE SURG 15 STRL LF DISP TIS (BLADE) ×1 IMPLANT
BLADE SURG 15 STRL SS (BLADE) ×2
CARTRIDGE OIL MAESTRO DRILL (MISCELLANEOUS) ×1 IMPLANT
CEMENT BONE KYPHX HV R (Orthopedic Implant) ×1 IMPLANT
CEMENT KYPHON C01A KIT/MIXER (Cement) ×1 IMPLANT
DECANTER SPIKE VIAL GLASS SM (MISCELLANEOUS) ×2 IMPLANT
DERMABOND ADVANCED (GAUZE/BANDAGES/DRESSINGS) ×1
DERMABOND ADVANCED .7 DNX12 (GAUZE/BANDAGES/DRESSINGS) ×1 IMPLANT
DIFFUSER DRILL AIR PNEUMATIC (MISCELLANEOUS) ×1 IMPLANT
DRAPE C-ARM 42X72 X-RAY (DRAPES) ×3 IMPLANT
DRAPE HALF SHEET 40X57 (DRAPES) ×2 IMPLANT
DRAPE LAPAROTOMY 100X72X124 (DRAPES) ×2 IMPLANT
DRAPE SURG 17X23 STRL (DRAPES) ×2 IMPLANT
DRAPE WARM FLUID 44X44 (DRAPE) ×2 IMPLANT
DURAPREP 26ML APPLICATOR (WOUND CARE) ×2 IMPLANT
GAUZE SPONGE 4X4 16PLY XRAY LF (GAUZE/BANDAGES/DRESSINGS) ×2 IMPLANT
GLOVE BIO SURGEON STRL SZ8 (GLOVE) ×3 IMPLANT
GLOVE BIOGEL PI IND STRL 8 (GLOVE) IMPLANT
GLOVE BIOGEL PI IND STRL 8.5 (GLOVE) ×1 IMPLANT
GLOVE BIOGEL PI INDICATOR 8 (GLOVE) ×1
GLOVE BIOGEL PI INDICATOR 8.5 (GLOVE) ×1
GOWN STRL REUS W/ TWL LRG LVL3 (GOWN DISPOSABLE) IMPLANT
GOWN STRL REUS W/ TWL XL LVL3 (GOWN DISPOSABLE) IMPLANT
GOWN STRL REUS W/TWL 2XL LVL3 (GOWN DISPOSABLE) IMPLANT
GOWN STRL REUS W/TWL LRG LVL3 (GOWN DISPOSABLE) ×4
GOWN STRL REUS W/TWL XL LVL3 (GOWN DISPOSABLE) ×4
KIT BASIN OR (CUSTOM PROCEDURE TRAY) ×2 IMPLANT
KIT TURNOVER KIT B (KITS) ×2 IMPLANT
NDL HYPO 25X1 1.5 SAFETY (NEEDLE) ×1 IMPLANT
NEEDLE HYPO 25X1 1.5 SAFETY (NEEDLE) ×2 IMPLANT
NS IRRIG 1000ML POUR BTL (IV SOLUTION) ×2 IMPLANT
OIL CARTRIDGE MAESTRO DRILL (MISCELLANEOUS) ×2
PACK SURGICAL SETUP 50X90 (CUSTOM PROCEDURE TRAY) ×2 IMPLANT
PAD ARMBOARD 7.5X6 YLW CONV (MISCELLANEOUS) ×6 IMPLANT
STAPLER SKIN PROX WIDE 3.9 (STAPLE) ×2 IMPLANT
SUT VIC AB 3-0 SH 8-18 (SUTURE) ×2 IMPLANT
SYR CONTROL 10ML LL (SYRINGE) ×4 IMPLANT
TOWEL GREEN STERILE (TOWEL DISPOSABLE) ×2 IMPLANT
TOWEL GREEN STERILE FF (TOWEL DISPOSABLE) ×2 IMPLANT
TRAY KYPHOPAK 20/3 ONESTEP 1ST (MISCELLANEOUS) ×1 IMPLANT

## 2017-12-23 NOTE — Brief Op Note (Signed)
12/23/2017  5:36 PM  PATIENT:  [name]  77 y.o. male  PRE-OPERATIVE DIAGNOSIS:  Compression fracture of Lumbar One vertebra with severe kyphotic spinal deformity  POST-OPERATIVE DIAGNOSIS:  Compression fracture of Lumbar One vertebra with severe kyphotic spinal deformity  PROCEDURE:  Procedure(s) with comments: Lumbar One KYPHOPLASTY (N/A) - Lumbar One KYPHOPLASTY  SURGEON:  Surgeon(s) and Role:    Erline Levine, MD - Primary  PHYSICIAN ASSISTANT:   ASSISTANTS: none   ANESTHESIA:   general  EBL: Minimal  BLOOD ADMINISTERED:none  DRAINS: none   LOCAL MEDICATIONS USED:  MARCAINE    and LIDOCAINE   SPECIMEN:  No Specimen  DISPOSITION OF SPECIMEN:  N/A  COUNTS:  YES  TOURNIQUET:  * No tourniquets in log *  DICTATION: DICTATION: Patient is 77 year old man with severe kyphotic spinal deformity.  He  has now developed a painful L 1 compression fracture after a fall, which is proving debilitatingly painful to him.  It was elected to take him to surgery for kyphoplasty procedure.  PROCEDURE:  Following the smooth and uncomplicated induction of general endotracheal anesthesia, the patient was placed in a prone position on chest rolls.  Padding was extremely difficult because of the patient's spinal deformity.  C-arm fluoroscopy was positioned in both the AP and lateral planes, centered on the L 1 vertebra.  His back was prepped and draped in the usual sterile fashion with Duraprep.  Using a left uni-pedicular approach, the left L1 pedicle  and vertebral body were entered with the trochar using standard landmarks.  The drill was used, followed by a 20 cc Kyphon balloon, which was used to re-expand the broken vertebra.  Subsequently, 9 cc of bone cement was placed into the void created by the balloon and was seen to fill the fracture cleft and fill the vertebra in both the AP and lateral direction with good interdigitation and without apparent extravasation. The bone void filler  was then removed.  Final X-ray demonstrated good filling within the fractured vertebra.  The incision was closed with a single 3-0 vicryl stitch and dressed with Dermabond. The patient was returned to the [facility] and extubated in the OR and taken to Recovery in stable and satisfactory condition, having tolerated the procedure well.  Counts were correct at the end of the case.   PLAN OF CARE: Admit for overnight observation  PATIENT DISPOSITION:  PACU - hemodynamically stable.   Delay start of Pharmacological VTE agent (>24hrs) due to surgical blood loss or risk of bleeding: yes

## 2017-12-23 NOTE — Anesthesia Postprocedure Evaluation (Signed)
Anesthesia Post Note  Patient: [name]  Procedure(s) Performed: Lumbar One KYPHOPLASTY (N/A )     Patient location during evaluation: PACU Anesthesia Type: General Level of consciousness: awake and alert, oriented and patient cooperative Pain management: pain level controlled Vital Signs Assessment: post-procedure vital signs reviewed and stable Respiratory status: spontaneous breathing, nonlabored ventilation and respiratory function stable Cardiovascular status: blood pressure returned to baseline and stable Postop Assessment: no apparent nausea or vomiting Anesthetic complications: no    Last Vitals:  Vitals:   12/23/17 1800 12/23/17 1811  BP: (!) 164/81 (!) 160/74  Pulse: 75 71  Resp: 13 15  Temp:    SpO2: 98% 98%    Last Pain:  Vitals:   12/23/17 1811  TempSrc:   PainSc: 1                  Arelyn Gauer,E. Julion Gatt

## 2017-12-23 NOTE — Anesthesia Preprocedure Evaluation (Addendum)
Anesthesia Evaluation  Patient identified by MRN, date of birth, ID band Patient awake    Reviewed: Allergy & Precautions, NPO status , Patient's Chart, lab work & pertinent test results  Airway Mallampati: II  TM Distance: >3 FB Neck ROM: Full    Dental  (+) Teeth Intact, Dental Advisory Given   Pulmonary former smoker,    breath sounds clear to auscultation       Cardiovascular hypertension,  Rhythm:Regular Rate:Normal     Neuro/Psych    GI/Hepatic   Endo/Other    Renal/GU      Musculoskeletal   Abdominal   Peds  Hematology   Anesthesia Other Findings   Reproductive/Obstetrics                             Anesthesia Physical Anesthesia Plan  ASA: III  Anesthesia Plan: General   Post-op Pain Management:    Induction: Intravenous  PONV Risk Score and Plan: Ondansetron  Airway Management Planned: Oral ETT  Additional Equipment:   Intra-op Plan:   Post-operative Plan: Extubation in OR  Informed Consent: I have reviewed the patients History and Physical, chart, labs and discussed the procedure including the risks, benefits and alternatives for the proposed anesthesia with the patient or authorized representative who has indicated his/her understanding and acceptance.     Dental advisory given  Plan Discussed with: CRNA and Anesthesiologist  Anesthesia Plan Comments:         Anesthesia Quick Evaluation  

## 2017-12-23 NOTE — Interval H&P Note (Signed)
History and Physical Interval Note:  12/23/2017 4:22 PM  [name]  has presented today for surgery, with the diagnosis of Compression fracture of Lumbar One vertebra  The various methods of treatment have been discussed with the patient and family. After consideration of risks, benefits and other options for treatment, the patient has consented to  Procedure(s) with comments: Lumbar One KYPHOPLASTY (N/A) - Lumbar One KYPHOPLASTY as a surgical intervention .  The patient's history has been reviewed, patient examined, no change in status, stable for surgery.  I have reviewed the patient's chart and labs.  Questions were answered to the patient's satisfaction.     Samad Thon D

## 2017-12-23 NOTE — Transfer of Care (Signed)
Immediate Anesthesia Transfer of Care Note  Patient: [name]  Procedure(s) Performed: Lumbar One KYPHOPLASTY (N/A )  Patient Location: PACU  Anesthesia Type:General  Level of Consciousness: awake, alert , oriented and patient cooperative  Airway & Oxygen Therapy: Patient Spontanous Breathing and Patient connected to face mask oxygen  Post-op Assessment: Report given to RN and Post -op Vital signs reviewed and stable  Post vital signs: Reviewed and stable  Last Vitals:  Vitals Value Taken Time  BP 158/91 12/23/2017  5:40 PM  Temp    Pulse 77 12/23/2017  5:46 PM  Resp 21 12/23/2017  5:46 PM  SpO2 97 % 12/23/2017  5:46 PM  Vitals shown include unvalidated device data.  Last Pain:  Vitals:   12/23/17 1238  TempSrc:   PainSc: 0-No pain         Complications: No apparent anesthesia complications

## 2017-12-23 NOTE — Anesthesia Procedure Notes (Signed)
Procedure Name: Intubation Date/Time: 12/23/2017 4:21 PM Performed by: Genelle Bal, CRNA Pre-anesthesia Checklist: Patient identified, Emergency Drugs available, Suction available and Patient being monitored Patient Re-evaluated:Patient Re-evaluated prior to induction Oxygen Delivery Method: Circle system utilized Preoxygenation: Pre-oxygenation with 100% oxygen Induction Type: IV induction Ventilation: Mask ventilation without difficulty Laryngoscope Size: Mac and 4 Grade View: Grade I Tube type: Oral Tube size: 7.5 mm Number of attempts: 1 Airway Equipment and Method: Stylet and Oral airway Placement Confirmation: ETT inserted through vocal cords under direct vision,  positive ETCO2 and breath sounds checked- equal and bilateral Secured at: 25 cm Tube secured with: Tape Dental Injury: Teeth and Oropharynx as per pre-operative assessment

## 2017-12-23 NOTE — H&P (Addendum)
Patient is 77 year old man with severe spinal kyphosis and he fell over a month ago and sustained an L 1 fracture, which is debilitatingly painful for him.    PMHX: DVT with PE x 2 Taking xarelto, Htn, Barret's esophagus, prostate cancer, reflux, gout.  Prior craniotomy for SDH 2013.  On exam, patient has full strength.  He is very kyphotic.  He is only able to stand with severe pain.  CT scan shows new L 1 compression fracture.  It was elected to take patient to surgery for L 1 kyphoplasty, as bracing is not an option and his pain is severe.

## 2017-12-23 NOTE — Progress Notes (Signed)
Awake, alert, conversant.  Minimal pain.  Full strength both legs.  Patient is doing well.

## 2017-12-23 NOTE — Op Note (Signed)
12/23/2017  5:36 PM  PATIENT:  [name]  77 y.o. male  PRE-OPERATIVE DIAGNOSIS:  Compression fracture of Lumbar One vertebra with severe kyphotic spinal deformity  POST-OPERATIVE DIAGNOSIS:  Compression fracture of Lumbar One vertebra with severe kyphotic spinal deformity  PROCEDURE:  Procedure(s) with comments: Lumbar One KYPHOPLASTY (N/A) - Lumbar One KYPHOPLASTY  SURGEON:  Surgeon(s) and Role:    Erline Levine, MD - Primary  PHYSICIAN ASSISTANT:   ASSISTANTS: none   ANESTHESIA:   general  EBL: Minimal  BLOOD ADMINISTERED:none  DRAINS: none   LOCAL MEDICATIONS USED:  MARCAINE    and LIDOCAINE   SPECIMEN:  No Specimen  DISPOSITION OF SPECIMEN:  N/A  COUNTS:  YES  TOURNIQUET:  * No tourniquets in log *  DICTATION: DICTATION: Patient is 77 year old man with severe kyphotic spinal deformity.  He  has now developed a painful L 1 compression fracture after a fall, which is proving debilitatingly painful to him.  It was elected to take him to surgery for kyphoplasty procedure.  PROCEDURE:  Following the smooth and uncomplicated induction of general endotracheal anesthesia, the patient was placed in a prone position on chest rolls.  Padding was extremely difficult because of the patient's spinal deformity.  C-arm fluoroscopy was positioned in both the AP and lateral planes, centered on the L 1 vertebra.  His back was prepped and draped in the usual sterile fashion with Duraprep.  Using a left uni-pedicular approach, the left L1 pedicle  and vertebral body were entered with the trochar using standard landmarks.  The drill was used, followed by a 20 cc Kyphon balloon, which was used to re-expand the broken vertebra.  Subsequently, 9 cc of bone cement was placed into the void created by the balloon and was seen to fill the fracture cleft and fill the vertebra in both the AP and lateral direction with good interdigitation and without apparent extravasation. The bone void filler  was then removed.  Final X-ray demonstrated good filling within the fractured vertebra.  The incision was closed with a single 3-0 vicryl stitch and dressed with Dermabond. The patient was returned to the [facility] and extubated in the OR and taken to Recovery in stable and satisfactory condition, having tolerated the procedure well.  Counts were correct at the end of the case.   PLAN OF CARE: Admit for overnight observation  PATIENT DISPOSITION:  PACU - hemodynamically stable.   Delay start of Pharmacological VTE agent (>24hrs) due to surgical blood loss or risk of bleeding: yes

## 2017-12-24 ENCOUNTER — Encounter (HOSPITAL_COMMUNITY): Payer: Self-pay | Admitting: Neurosurgery

## 2017-12-24 DIAGNOSIS — S32010A Wedge compression fracture of first lumbar vertebra, initial encounter for closed fracture: Secondary | ICD-10-CM | POA: Diagnosis not present

## 2017-12-24 MED ORDER — HYDROCODONE-ACETAMINOPHEN 7.5-325 MG PO TABS: 1.0000 | ORAL_TABLET | Freq: Four times a day (QID) | ORAL | 0 refills | Status: DC | PRN

## 2017-12-24 NOTE — Evaluation (Signed)
Physical Therapy Evaluation Patient Details Name: [name] MRN: [id number] DOB: 08/22/1940 Today's Date: 12/24/2017   History of Present Illness  [name] is a 77 y.o. male with medical history significant of AAA, CAD/history of CABG, RBBB, hypertension, common iliac aneurysm, pulmonary hypertension, history of PEs and DVTs on Xarelto, osteoarthritis, GERD, Barrett's esophagus, history of esophageal stricture, colon polyps, diverticulosis, gout, chronic lower back pain, history of prostate cancer, history of subdural hematoma, S/P evacuation of SDH who presents s/p L1 kyphoplasty after sustaining a fall one month ago.  Clinical Impression  Patient is s/p above surgery resulting in the deficits listed below (see PT Problem List). At baseline, patient is independent with ADL's and has been using a walker since his fall a month ago for pain control. Currently presents with functional limitations in mobility secondary to abnormal posture and decreased gait speed. Ambulated 160 feet with no assistive device and with walker. Noted increased gait speed and foot clearance with walker. Recommended cane or walker for household ambulation and walker for community ambulation. Patient will benefit from continued PT to increase their independence and safety with mobility to allow discharge home.     Follow Up Recommendations No PT follow up;Supervision for mobility/OOB    Equipment Recommendations  None recommended by PT    Recommendations for Other Services       Precautions / Restrictions Precautions Precautions: Fall;Back Precaution Booklet Issued: No Precaution Comments: (back for comfort) Restrictions Weight Bearing Restrictions: No      Mobility  Bed Mobility               General bed mobility comments: OOB in recliner  Transfers Overall transfer level: Modified independent Equipment used: None                Ambulation/Gait Ambulation/Gait assistance:  Supervision Gait Distance (Feet): 160 Feet Assistive device: None;Rolling walker (2 wheeled) Gait Pattern/deviations: Step-through pattern;Shuffle;Decreased stride length;Trunk flexed Gait velocity: decreased   General Gait Details: patient initially ambulated first 80 feet with no assistive device and remaining 80 feet with a walker. No overt LOB noted without the walker, but with the walker, patient with increased gait speed and improved foot clearance. Cueing for walker proximity.   Stairs            Wheelchair Mobility    Modified Rankin (Stroke Patients Only)       Balance Overall balance assessment: Mild deficits observed, not formally tested                                           Pertinent Vitals/Pain Pain Assessment: No/denies pain    Home Living Family/patient expects to be discharged to:: Private residence Living Arrangements: Spouse/significant other Available Help at Discharge: Family;Available 24 hours/day(wife) Type of Home: Apartment Home Access: Level entry     Home Layout: One level Home Equipment: Cane - quad;Shower seat - built in;Grab bars - tub/shower;Adaptive equipment      Prior Function Level of Independence: Independent with assistive device(s)         Comments: independent with ADL's and uses walker.      Hand Dominance   Dominant Hand: Right    Extremity/Trunk Assessment   Upper Extremity Assessment Upper Extremity Assessment: Defer to OT evaluation    Lower Extremity Assessment Lower Extremity Assessment: Overall WFL for tasks assessed    Cervical / Trunk Assessment  Cervical / Trunk Assessment: Kyphotic;Other exceptions Cervical / Trunk Exceptions: s/p L1 kyphoplasty  Communication   Communication: No difficulties  Cognition Arousal/Alertness: Awake/alert Behavior During Therapy: WFL for tasks assessed/performed Overall Cognitive Status: Within Functional Limits for tasks assessed                                         General Comments      Exercises Other Exercises Other Exercises: Scapular retractions x 10 (3 second hold) Other Exercises: Backward shoulder rolls x 10    Assessment/Plan    PT Assessment Patient needs continued PT services  PT Problem List Decreased balance;Decreased mobility       PT Treatment Interventions DME instruction;Gait training;Stair training;Functional mobility training;Therapeutic activities;Therapeutic exercise;Balance training;Patient/family education    PT Goals (Current goals can be found in the Care Plan section)  Acute Rehab PT Goals Patient Stated Goal: walk without walker PT Goal Formulation: With patient Time For Goal Achievement: 01/07/18 Potential to Achieve Goals: Good    Frequency Min 5X/week   Barriers to discharge        Co-evaluation               AM-PAC PT "6 Clicks" Daily Activity  Outcome Measure Difficulty turning over in bed (including adjusting bedclothes, sheets and blankets)?: A Little Difficulty moving from lying on back to sitting on the side of the bed? : A Little Difficulty sitting down on and standing up from a chair with arms (e.g., wheelchair, bedside commode, etc,.)?: None Help needed moving to and from a bed to chair (including a wheelchair)?: A Little Help needed walking in hospital room?: A Little Help needed climbing 3-5 steps with a railing? : A Little 6 Click Score: 19    End of Session Equipment Utilized During Treatment: Gait belt Activity Tolerance: Patient tolerated treatment well Patient left: in chair;with call bell/phone within reach Nurse Communication: Mobility status PT Visit Diagnosis: Difficulty in walking, not elsewhere classified (R26.2);History of falling (Z91.81)    Time: 0321-2248 PT Time Calculation (min) (ACUTE ONLY): 16 min   Charges:   PT Evaluation $PT Eval Low Complexity: 1 Low     PT G Codes:        Ellamae Sia, PT, DPT Acute  Rehabilitation Services  Pager: [phone]   Willy Eddy 12/24/2017, 8:27 AM

## 2017-12-24 NOTE — Progress Notes (Signed)
Patient is discharged from room 3C07 at this time. Alert and in stable condition. IV site d/c'd and instructions read to patient and spouse with understanding verbalized. Left unit via wheelchair with all belongings at side. 

## 2017-12-24 NOTE — Progress Notes (Signed)
OT Note - Addendum    12/24/17 1500  OT Visit Information  Last OT Received On 12/24/17  OT Time Calculation  OT Start Time (ACUTE ONLY) 0837  OT Stop Time (ACUTE ONLY) 0848  OT Time Calculation (min) 11 min  OT General Charges  $OT Visit 1 Visit  OT Evaluation  $OT Eval Low Complexity 1 Low  Maurie Boettcher, OT/L  OT Clinical Specialist [phone]

## 2017-12-24 NOTE — Discharge Summary (Signed)
Physician Discharge Summary  Patient ID: [name] MRN: [id number] DOB/AGE: 27-Jan-1941 77 y.o.  Admit date: 12/23/2017 Discharge date: 12/24/2017  Admission Diagnoses:Kyphotic spinal deformity with L 1 compression fracture  Discharge Diagnoses: Same Active Problems:   Lumbar compression fracture [facility])   Discharged Condition: good  Hospital Course: Patient underwent L 1 kyphoplasty for painful compression fracture.  He did well with the procedure and has had significant improvement in his pain and mobility.  Consults: None  Significant Diagnostic Studies: None  Treatments: surgery:  Patient underwent L 1 kyphoplasty for painful compression fracture  Discharge Exam: Blood pressure 140/85, pulse (!) 102, temperature 97.7 F (36.5 C), temperature source Oral, resp. rate 18, height 6' (1.829 m), weight 97.5 kg (215 lb), SpO2 95 %. Neurologic: Alert and oriented X 3, normal strength and tone. Normal symmetric reflexes. Normal coordination and gait Wound:CDI  Disposition: Home   Allergies as of 12/24/2017   No Known Allergies     Medication List    TAKE these medications   allopurinol 300 MG tablet Commonly known as:  ZYLOPRIM Take 300 mg by mouth every morning.   celecoxib 200 MG capsule Commonly known as:  CELEBREX Take 200 mg by mouth 2 (two) times daily as needed for moderate pain.   DULoxetine 30 MG capsule Commonly known as:  CYMBALTA Take 30 mg by mouth every morning.   ferrous sulfate 325 (65 FE) MG tablet Take 1 tablet (325 mg total) by mouth daily with breakfast.   HYDROcodone-acetaminophen 7.5-325 MG tablet Commonly known as:  NORCO Take 1 tablet by mouth every 6 (six) hours as needed for moderate pain. What changed:  reasons to take this   lisinopril 20 MG tablet Commonly known as:  PRINIVIL,ZESTRIL Take 1 tablet (20 mg total) by mouth daily.   polyethylene glycol packet Commonly known as:  MIRALAX / GLYCOLAX Take 17 g by mouth daily.    ranitidine 150 MG tablet Commonly known as:  ZANTAC Take 150 mg by mouth 2 (two) times daily.   XARELTO 20 MG Tabs tablet Generic drug:  rivaroxaban TAKE 1 TABLET BY MOUTH EVERY DAY WITH SUPPER        Signed: Peggyann Shoals, MD 12/24/2017, 7:03 AM

## 2017-12-24 NOTE — Progress Notes (Signed)
Occupational Therapy Evaluation Patient Details Name: [name] MRN: [id number] DOB: 1941-06-14 Today's Date: 12/24/2017    History of Present Illness Maurion Walkowiak is a 77 y.o. male with medical history significant of AAA, CAD/history of CABG, RBBB, hypertension, common iliac aneurysm, pulmonary hypertension, history of PEs and DVTs on Xarelto, osteoarthritis, GERD, Barrett's esophagus, history of esophageal stricture, colon polyps, diverticulosis, gout, chronic lower back pain, history of prostate cancer, history of subdural hematoma, S/P evacuation of SDH who presents s/p L1 kyphoplasty after sustaining a fall one month ago.   Clinical Impression   PTA, pt was living at home with his spouse, and was independent with ADLs and used walker for functional mobility for pain management after his fall one month ago. Pt currently requires min guard during ADLs to adhere to his back precautions and minguard with functional mobility. All education has been completed and the patient has no further questions. See below for any follow-up Occupational Therapy or equipment needs. OT to sign off. Thank you for referral.      Follow Up Recommendations  No OT follow up;Supervision - Intermittent    Equipment Recommendations  None recommended by OT    Recommendations for Other Services       Precautions / Restrictions Precautions Precautions: Fall;Back Precaution Booklet Issued: No Precaution Comments: (back for comfort) Restrictions Weight Bearing Restrictions: No      Mobility Bed Mobility               General bed mobility comments: OOB in recliner  Transfers Overall transfer level: Modified independent Equipment used: None             General transfer comment: pt ambulated from recliner to toilet by furniture walking    Balance Overall balance assessment: Mild deficits observed, not formally tested                                         ADL either  performed or assessed with clinical judgement   ADL Overall ADL's : Needs assistance/impaired Eating/Feeding: Set up;Sitting   Grooming: Min guard;Standing   Upper Body Bathing: Min guard;Sitting   Lower Body Bathing: Min guard;Sitting/lateral leans   Upper Body Dressing : Min guard;Sitting   Lower Body Dressing: Minimal assistance;Sit to/from stand;Cueing for back precautions   Toilet Transfer: Min guard;Grab bars(cueing for back precautions)   Toileting- Clothing Manipulation and Hygiene: Min guard;Cueing for back precautions   Tub/ Shower Transfer: Min guard;Shower seat;Grab bars   Functional mobility during ADLs: Min guard;Cueing for safety General ADL Comments: pt required consistent VC for adherence to back precautions;pt stated he prefered to get dressed without staff in the room;pt impulsive with bending to dress LB, required multimodal cues to adhere to back precautions during LB dressing;educated pt/spouse on importance of adherence to back precautions during ADLs     Vision         Perception     Praxis      Pertinent Vitals/Pain Pain Assessment: No/denies pain     Hand Dominance Right   Extremity/Trunk Assessment Upper Extremity Assessment Upper Extremity Assessment: Generalized weakness   Lower Extremity Assessment Lower Extremity Assessment: Defer to PT evaluation   Cervical / Trunk Assessment Cervical / Trunk Assessment: Kyphotic;Other exceptions Cervical / Trunk Exceptions: s/p L1 kyphoplasty   Communication Communication Communication: No difficulties   Cognition Arousal/Alertness: Awake/alert Behavior During Therapy: Agitated Overall Cognitive Status: Within  Functional Limits for tasks assessed                                 General Comments: pt ready to d.c. agitated by repetion of questions, wanted to get dressed without staff in room;agreed to toilet transfer during session   General Comments  educated pt/spouse on  environmental modifications to prevent falls;    Exercises    Shoulder Instructions      Home Living Family/patient expects to be discharged to:: Private residence Living Arrangements: Spouse/significant other Available Help at Discharge: Family;Available 24 hours/day(wife) Type of Home: Apartment Home Access: Level entry     Home Layout: One level     Bathroom Shower/Tub: Occupational psychologist: Handicapped height Bathroom Accessibility: Yes   Home Equipment: [place] - quad;Shower seat - built in;Grab bars - tub/shower;Adaptive equipment Adaptive Equipment: Reacher;Sock aid        Prior Functioning/Environment Level of Independence: Independent with assistive device(s)        Comments: independent with ADL's and uses walker.         OT Problem List: Decreased strength;Decreased range of motion;Decreased activity tolerance;Impaired balance (sitting and/or standing);Decreased safety awareness;Decreased knowledge of use of DME or AE;Decreased knowledge of precautions      OT Treatment/Interventions:      OT Goals(Current goals can be found in the care plan section) Acute Rehab OT Goals Patient Stated Goal: to go home OT Goal Formulation: With patient Time For Goal Achievement: 01/07/18 Potential to Achieve Goals: Good  OT Frequency:     Barriers to D/C:            Co-evaluation              AM-PAC PT "6 Clicks" Daily Activity     Outcome Measure Help from another person eating meals?: None Help from another person taking care of personal grooming?: A Little Help from another person toileting, which includes using toliet, bedpan, or urinal?: A Little Help from another person bathing (including washing, rinsing, drying)?: A Little Help from another person to put on and taking off regular upper body clothing?: A Little Help from another person to put on and taking off regular lower body clothing?: A Little 6 Click Score: 19   End of Session  Equipment Utilized During Treatment: Gait belt Nurse Communication: Mobility status  Activity Tolerance: Patient tolerated treatment well Patient left: in chair;with family/visitor present  OT Visit Diagnosis: Unsteadiness on feet (R26.81);Other abnormalities of gait and mobility (R26.89);Muscle weakness (generalized) (M62.81)                Time: 1962-2297 OT Time Calculation (min): 11 min Charges:    G-Codes:     Dorinda Hill OTS    Dorinda Hill 12/24/2017, 9:30 AM

## 2017-12-24 NOTE — Discharge Instructions (Signed)
Wound Care Leave incision open to air. You may shower. Do not scrub directly on incision.  Do not put any creams, lotions, or ointments on incision. Activity Walk each and every day, increasing distance each day. No lifting greater than 5 lbs.  Avoid bending, arching, and twisting. No driving for 2 weeks; may ride as a passenger locally.  IDiet Resume your normal diet.  Return to Work Will be discussed at you follow up appointment. Call Your Doctor If Any of These Occur Redness, drainage, or swelling at the wound.  Temperature greater than 101 degrees. Severe pain not relieved by pain medication. Incision starts to come apart. Follow Up Appt Call today for appointment in 2-3 weeks ([phone]) or for problems.  If you have any hardware placed in your spine, you will need an x-ray before your appointment.

## 2017-12-24 NOTE — Progress Notes (Signed)
Subjective: Patient reports "much better."  Objective: Vital signs in last 24 hours: Temp:  [97.2 F (36.2 C)-98.8 F (37.1 C)] 97.7 F (36.5 C) (07/03 0441) Pulse Rate:  [71-118] 102 (07/03 0441) Resp:  [13-18] 18 (07/03 0441) BP: (140-186)/(74-103) 140/85 (07/03 0441) SpO2:  [94 %-99 %] 95 % (07/03 0441) Weight:  [97.5 kg (215 lb)] 97.5 kg (215 lb) (07/02 1217)  Intake/Output from previous day: 07/02 0701 - 07/03 0700 In: 1175 [P.O.:75; I.V.:1100] Out: 1 [Blood:1] Intake/Output this shift: No intake/output data recorded.  Physical Exam: Dressing CDI.  Full strength.  Pain is much improved.  Lab Results: Recent Labs    12/23/17 1238  WBC 4.2  HGB 12.9*  HCT 42.4  PLT 207   BMET Recent Labs    12/23/17 1238  NA 140  K 4.5  CL 106  CO2 25  GLUCOSE 105*  BUN 18  CREATININE 0.95  CALCIUM 8.7*    Studies/Results: Dg Lumbar Spine 2-3 Views  Result Date: 12/23/2017 CLINICAL DATA:  77 year old male with L1 kyphoplasty. Initial encounter. EXAM: DG C-ARM 61-120 MIN; LUMBAR SPINE - 2-3 VIEW Fluoroscopic time: 2 minutes and 23 seconds. COMPARISON:  12/16/2017 CT. FINDINGS: Two intraoperative C-arm views submitted for review after surgery. L1 compression fracture treated with cement augmentation. Level assignment based on the presence of fracture on recent CT. Contrast most likely extends long pedicle projecting over the canal on lateral view. This can be assessed on follow-up. Otherwise negative. IMPRESSION: Post L1 kyphoplasty. Electronically Signed   By: Genia Del M.D.   On: 12/23/2017 18:20   Dg C-arm 1-60 Min  Result Date: 12/23/2017 CLINICAL DATA:  77 year old male with L1 kyphoplasty. Initial encounter. EXAM: DG C-ARM 61-120 MIN; LUMBAR SPINE - 2-3 VIEW Fluoroscopic time: 2 minutes and 23 seconds. COMPARISON:  12/16/2017 CT. FINDINGS: Two intraoperative C-arm views submitted for review after surgery. L1 compression fracture treated with cement augmentation. Level  assignment based on the presence of fracture on recent CT. Contrast most likely extends long pedicle projecting over the canal on lateral view. This can be assessed on follow-up. Otherwise negative. IMPRESSION: Post L1 kyphoplasty. Electronically Signed   By: Genia Del M.D.   On: 12/23/2017 18:20    Assessment/Plan: Patient is doing well.  Discharge home.  BP better controlled.    LOS: 0 days    Peggyann Shoals, MD 12/24/2017, 7:01 AM

## 2017-12-29 ENCOUNTER — Ambulatory Visit (HOSPITAL_COMMUNITY)
Admission: RE | Admit: 2017-12-29 | Discharge: 2017-12-29 | Disposition: A | Discharge status: Home / Self Care | Payer: PPO | Source: Ambulatory Visit | Attending: Cardiovascular Disease | Admitting: Cardiovascular Disease

## 2017-12-29 DIAGNOSIS — I714 Abdominal aortic aneurysm, without rupture, unspecified: Secondary | ICD-10-CM

## 2017-12-31 ENCOUNTER — Other Ambulatory Visit: Payer: Self-pay | Admitting: *Deleted

## 2017-12-31 DIAGNOSIS — I714 Abdominal aortic aneurysm, without rupture, unspecified: Secondary | ICD-10-CM

## 2018-01-15 DIAGNOSIS — Z8546 Personal history of malignant neoplasm of prostate: Secondary | ICD-10-CM | POA: Diagnosis not present

## 2018-01-19 DIAGNOSIS — S32010A Wedge compression fracture of first lumbar vertebra, initial encounter for closed fracture: Secondary | ICD-10-CM | POA: Diagnosis not present

## 2018-01-19 DIAGNOSIS — S32010G Wedge compression fracture of first lumbar vertebra, subsequent encounter for fracture with delayed healing: Secondary | ICD-10-CM | POA: Diagnosis not present

## 2018-01-19 DIAGNOSIS — M545 Low back pain: Secondary | ICD-10-CM | POA: Diagnosis not present

## 2018-01-19 DIAGNOSIS — M4015 Other secondary kyphosis, thoracolumbar region: Secondary | ICD-10-CM | POA: Diagnosis not present

## 2018-01-28 DIAGNOSIS — N3040 Irradiation cystitis without hematuria: Secondary | ICD-10-CM | POA: Diagnosis not present

## 2018-01-28 DIAGNOSIS — Z8546 Personal history of malignant neoplasm of prostate: Secondary | ICD-10-CM | POA: Diagnosis not present

## 2018-01-28 DIAGNOSIS — I10 Essential (primary) hypertension: Secondary | ICD-10-CM | POA: Diagnosis not present

## 2018-01-28 DIAGNOSIS — R350 Frequency of micturition: Secondary | ICD-10-CM | POA: Diagnosis not present

## 2018-01-28 DIAGNOSIS — R3121 Asymptomatic microscopic hematuria: Secondary | ICD-10-CM | POA: Diagnosis not present

## 2018-02-02 DIAGNOSIS — H524 Presbyopia: Secondary | ICD-10-CM | POA: Diagnosis not present

## 2018-02-02 DIAGNOSIS — I714 Abdominal aortic aneurysm, without rupture: Secondary | ICD-10-CM | POA: Diagnosis not present

## 2018-02-02 DIAGNOSIS — Z87891 Personal history of nicotine dependence: Secondary | ICD-10-CM | POA: Diagnosis not present

## 2018-02-02 DIAGNOSIS — J439 Emphysema, unspecified: Secondary | ICD-10-CM | POA: Diagnosis not present

## 2018-02-02 DIAGNOSIS — Z0000 Encounter for general adult medical examination without abnormal findings: Secondary | ICD-10-CM | POA: Diagnosis not present

## 2018-02-02 DIAGNOSIS — I2510 Atherosclerotic heart disease of native coronary artery without angina pectoris: Secondary | ICD-10-CM | POA: Diagnosis not present

## 2018-02-02 DIAGNOSIS — Z961 Presence of intraocular lens: Secondary | ICD-10-CM | POA: Diagnosis not present

## 2018-02-02 DIAGNOSIS — K219 Gastro-esophageal reflux disease without esophagitis: Secondary | ICD-10-CM | POA: Diagnosis not present

## 2018-02-02 DIAGNOSIS — M545 Low back pain: Secondary | ICD-10-CM | POA: Diagnosis not present

## 2018-02-02 DIAGNOSIS — M1A9XX0 Chronic gout, unspecified, without tophus (tophi): Secondary | ICD-10-CM | POA: Diagnosis not present

## 2018-02-02 DIAGNOSIS — E875 Hyperkalemia: Secondary | ICD-10-CM | POA: Diagnosis not present

## 2018-02-02 DIAGNOSIS — K2270 Barrett's esophagus without dysplasia: Secondary | ICD-10-CM | POA: Diagnosis not present

## 2018-02-02 DIAGNOSIS — Z86711 Personal history of pulmonary embolism: Secondary | ICD-10-CM | POA: Diagnosis not present

## 2018-02-02 DIAGNOSIS — I10 Essential (primary) hypertension: Secondary | ICD-10-CM | POA: Diagnosis not present

## 2018-02-27 DIAGNOSIS — Z23 Encounter for immunization: Secondary | ICD-10-CM | POA: Diagnosis not present

## 2018-03-09 DIAGNOSIS — S32010A Wedge compression fracture of first lumbar vertebra, initial encounter for closed fracture: Secondary | ICD-10-CM | POA: Diagnosis not present

## 2018-03-09 DIAGNOSIS — M545 Low back pain: Secondary | ICD-10-CM | POA: Diagnosis not present

## 2018-03-09 DIAGNOSIS — M4015 Other secondary kyphosis, thoracolumbar region: Secondary | ICD-10-CM | POA: Diagnosis not present

## 2018-03-09 DIAGNOSIS — I10 Essential (primary) hypertension: Secondary | ICD-10-CM | POA: Diagnosis not present

## 2018-03-11 ENCOUNTER — Other Ambulatory Visit: Payer: Self-pay | Admitting: Cardiology

## 2018-03-11 DIAGNOSIS — D649 Anemia, unspecified: Secondary | ICD-10-CM | POA: Diagnosis not present

## 2018-03-11 DIAGNOSIS — E875 Hyperkalemia: Secondary | ICD-10-CM | POA: Diagnosis not present

## 2018-04-07 DIAGNOSIS — M4015 Other secondary kyphosis, thoracolumbar region: Secondary | ICD-10-CM | POA: Diagnosis not present

## 2018-04-07 DIAGNOSIS — M545 Low back pain: Secondary | ICD-10-CM | POA: Diagnosis not present

## 2018-04-08 DIAGNOSIS — D6489 Other specified anemias: Secondary | ICD-10-CM | POA: Diagnosis not present

## 2018-04-08 DIAGNOSIS — E875 Hyperkalemia: Secondary | ICD-10-CM | POA: Diagnosis not present

## 2018-06-13 ENCOUNTER — Encounter: Payer: Self-pay | Admitting: Gastroenterology

## 2018-07-03 ENCOUNTER — Telehealth: Payer: Self-pay | Admitting: Cardiology

## 2018-07-03 NOTE — Telephone Encounter (Signed)
Spoke with pt wife, she reports the patient is bent double due to a fall and hurting his back. She reports his breathing is severe when walking from his chair to the bathroom. He was walking to the mailbox but has quit because his breathing is too bad. He has swelling in his feet, ankles and lower legs. He is unable to lie flat and does not seem to having any breathing problems once he gets settled. He always has his legs elevated when sitting. He does not watch his salt as good as he should and does not drink a lot of water but she reports he drinks a lot of alcohol. He is taking hydrocodone all the timew because his back hurts so bad and he feels bad all the time. He has a follow up appt Wednesday next week. Will forward for dr Stanford Breed review

## 2018-07-03 NOTE — Telephone Encounter (Signed)
Pt c/o swelling: STAT is pt has developed SOB within 24 hours  1) How much weight have you gained and in what time span?  She did not know  2) If swelling, where is the swelling located? Feet and legs  3) Are you currently taking a fluid pill? no  4) Are you currently SOB? If he walks from living room to the bathroom gasp for breath  5) Do you have a log of your daily weights (if so, list)? no  6) Have you gained 3 pounds in a day or 5 pounds in a week? Do not know  7) Have you traveled recently? No- pt wanted to be seen-07-08-18 with Merlinda Frederick call to evaluate

## 2018-07-06 MED ORDER — FUROSEMIDE 20 MG PO TABS: 20.0000 mg | ORAL_TABLET | Freq: Every day | ORAL | 3 refills | Status: DC

## 2018-07-06 NOTE — Telephone Encounter (Signed)
Lasix 20 mg daily; fu primary care for back pain Kirk Ruths

## 2018-07-06 NOTE — Telephone Encounter (Signed)
Spoke with pt wife, Aware of dr Jacalyn Lefevre recommendations. He has a follow up appointment Wednesday 07-08-18.

## 2018-07-08 ENCOUNTER — Ambulatory Visit: Payer: PPO | Admitting: Adult Health

## 2018-07-08 ENCOUNTER — Telehealth: Payer: Self-pay | Admitting: Cardiology

## 2018-07-08 ENCOUNTER — Encounter: Payer: Self-pay | Admitting: Adult Health

## 2018-07-08 VITALS — BP 170/84 | HR 97 | Ht 72.0 in | Wt 237.0 lb

## 2018-07-08 DIAGNOSIS — Z79899 Other long term (current) drug therapy: Secondary | ICD-10-CM

## 2018-07-08 DIAGNOSIS — M545 Low back pain: Secondary | ICD-10-CM | POA: Diagnosis not present

## 2018-07-08 DIAGNOSIS — R0609 Other forms of dyspnea: Secondary | ICD-10-CM

## 2018-07-08 DIAGNOSIS — I10 Essential (primary) hypertension: Secondary | ICD-10-CM | POA: Diagnosis not present

## 2018-07-08 DIAGNOSIS — D649 Anemia, unspecified: Secondary | ICD-10-CM

## 2018-07-08 DIAGNOSIS — R600 Localized edema: Secondary | ICD-10-CM

## 2018-07-08 MED ORDER — OLMESARTAN MEDOXOMIL 20 MG PO TABS: 10.0000 mg | ORAL_TABLET | Freq: Every day | ORAL | 3 refills | Status: DC

## 2018-07-08 NOTE — Telephone Encounter (Signed)
Thank you. This is a difficult situation as he is reluctant to follow recommendations. He only wishes to see Dr. Stanford Breed.

## 2018-07-08 NOTE — Telephone Encounter (Signed)
New Message    Pt c/o medication issue:  1. Name of Medication: Benicar  2. How are you currently taking this medication (dosage and times per day)? n/a  3. Are you having a reaction (difficulty breathing--STAT)? No   4. What is your medication issue? Patient's wife states some of the side effects will cause harm to patient and she doesn't want to give it to him until she speaks to the nurse

## 2018-07-08 NOTE — Patient Instructions (Signed)
Medication Instructions:  START BENICAR 10MG  (1/2TAB)DAILY If you need a refill on your cardiac medications before your next appointment, please call your pharmacy.  Labwork: CBC, BMET AND BNP TODAY HERE IN OUR OFFICE AT [facility] Take the provided lab slips with you to the lab for your blood draw.   When you have your labs (blood work) drawn today and your tests are completely normal, you will receive your results only by MyChart Message (if you have MyChart) -OR-  A paper copy in the mail.  If you have any lab test that is abnormal or we need to change your treatment, we will call you to review these results.  Follow-Up: You will need a follow up appointment in 6 weeks.  You may see Kirk Ruths, MD  Jory Sims, DNP, AACC or one of the following Advanced Practice Providers on your designated Care Team:  Kerin Ransom, PA-C   Roby Lofts, PA-C   Sande Rives, [place]   At [facility], you and your health needs are our priority.  As part of our continuing mission to provide you with exceptional heart care, we have created designated Provider Care Teams.  These Care Teams include your primary Cardiologist (physician) and Advanced Practice Providers (APPs -  Physician Assistants and Nurse Practitioners) who all work together to provide you with the care you need, when you need it.  Thank you for choosing [facility] at [facility]!!

## 2018-07-08 NOTE — Telephone Encounter (Signed)
See by provider today. Dyspnea currently under assessment. May be sequela from PE or fluid retention.   Recommend to take medication as prescribed. Will defer additional recommendation to provider that completed assessment today.

## 2018-07-08 NOTE — Telephone Encounter (Signed)
With verbal permission from pt, spoke to pt's wife who states pt was prescribed Benicar today and after picking up the medication she was concerned about all the side effects. She states the one in particular is that it could cause SOB. Wife states pt was seen in the office today for those symptoms and doesn't want to give a medication that has the potential to cause it. Will route to Pharm D and DNP for recommendations.

## 2018-07-08 NOTE — Progress Notes (Signed)
Cardiology Office Note   Date:  07/08/2018   ID:  [name], DOB 12/12/1940, MRN [id number]  PCP:  Jeffery Pretty, MD  Cardiologist: [name]  No chief complaint on file.    History of Present Illness: [name] is a 78 y.o. male who presents for ongoing assessment and management of CAD, hx of PE on Xarelto, AAA, HL refuses statins, and HTN.  Was last seen in the office by [name] on 11/10/2017   He was hospitalized 12/24/2017 for back pain and underwent L1 kyphoplasty for painful compression fracture by [name].  His wife called our office on 07/03/2018 for patient complaints of dyspnea, LEE, and PND. He was non-complaint with salt restriction, wife reported that he drinks a lot of ETOH. He is bent over due to back pain. He was started on lasix 20 mg daily, was to see PCP for back pain management.   Comes today very disgruntled and irritated by the dyspnea and also fluid retention. He has only taken on dose of the lasix so far and due to appointments today has not had any more doses He does not feel any better. He was taken off of lisinopril for unknown reason by PCP. We do not have access to records at the time this office visit.    He states he has had dyspnea for 2 years which has worsened for the last two months. LEE has worsened over the last month. He denies chest pain. He cannot lie flat due to chronic back pain and severe kyphoses.   Past Medical History:  Diagnosis Date  . AAA (abdominal aortic aneurysm) ([place]) followed by dr [name]   last duplex 07-17-2017  3.2 x 3.3  . Anticoagulant long-term use    xarelto for hx PEs and DVT  . Arthritis    back  . Barrett's esophagus 2009  . Bilateral lower extremity edema    chronic pedal  . CAD (coronary artery disease) cardioloigst--  dr [name]   cardiac cath 04-12-2014  single vessel obstructive cad involving ostrium D2, and other nonobstructive cad , normall lvf  . Chronic lower back pain   . Common iliac  aneurysm ([place])    per duplex 07-17-2017  left cia 2 x 2, right cia 2.1 x 2  . Diverticulosis   . Dyspnea    with exertion   . Dyspnea on exertion   . FH: colonic polyps   . GERD (gastroesophageal reflux disease)   . Gout    08-27-2017  per pt stable  . History of adenomatous polyp of colon   . History of DVT of lower extremity    10/ 2015  LLE  . History of esophageal stricture    post dilation  . History of gastric polyp    benign  . History of pulmonary embolus (PE)    2005  treated 1 yr w/ coumadin and 10/ 2015  bilateral  . History of radiation therapy 06/22/2012-08/18/2012   78 Gy to prostate (external beam)  . History of subdural hematoma 03/2012   s/p  evacuation  . Hypertension   . Lesion of bladder    non canerous  . Prostate cancer [facility]) urologist-  dr [name]/ oncologist- dr [name]   dx 07/ 2013--  Stage T1c, [place] 7, vol 67cc--- completed external beam radiation therapy 08-18-2012  . Pulmonary HTN ([place]) 04/12/2014   last echo 05-01-2017  PASP 11mmHg  . RBBB (right bundle branch block with left anterior fascicular block)  ocassional    Past Surgical History:  Procedure Laterality Date  . CATARACT EXTRACTION W/ INTRAOCULAR LENS  IMPLANT, BILATERAL  2018  . COLONOSCOPY WITH PROPOFOL N/A 04/25/2015   Procedure: COLONOSCOPY WITH PROPOFOL;  Surgeon: Jeffery Gunning, MD;  Location: [facility];  Service: Gastroenterology;  Laterality: N/A;  . CRANIOTOMY  04/03/2012   Procedure: CRANIOTOMY HEMATOMA EVACUATION SUBDURAL;  Surgeon: Jeffery Levine, MD;  Location: [place] [facility] ORS;  Service: Neurosurgery;  Laterality: Left;  Left Craniotomy for Evacuation of Subdural Hematoma  . CYSTOSCOPY WITH BIOPSY N/A 09/02/2017   Procedure: CYSTOSCOPY WITH BIOPSY AND FULGURATION POSSIBLE TRANSURETHRAL RESECTION OF BLADDER TUMOR;  Surgeon: Jeffery Seal, MD;  Location: [facility];  Service: Urology;  Laterality: N/A;  . ESOPHAGOGASTRODUODENOSCOPY (EGD) WITH PROPOFOL N/A  04/25/2015   Procedure: ESOPHAGOGASTRODUODENOSCOPY (EGD) WITH PROPOFOL;  Surgeon: Jeffery Gunning, MD;  Location: [facility];  Service: Gastroenterology;  Laterality: N/A;  . IRRIGATION AND DEBRIDEMENT SEBACEOUS CYST  01-11-13   "off my back"  . KYPHOPLASTY N/A 12/23/2017   Procedure: Lumbar One KYPHOPLASTY;  Surgeon: Jeffery Levine, MD;  Location: [place];  Service: Neurosurgery;  Laterality: N/A;  Lumbar One KYPHOPLASTY  . LEFT AND RIGHT HEART CATHETERIZATION WITH CORONARY ANGIOGRAM N/A 04/12/2014   Procedure: LEFT AND RIGHT HEART CATHETERIZATION WITH CORONARY ANGIOGRAM;  Surgeon: Jeffery M Martinique, MD;  Location: [facility] CATH LAB;  Service: Cardiovascular;  Laterality: N/A;  . ORIF FEMUR FRACTURE Left 10-02-2010   dr [name]   subtrochanteric  . TONSILLECTOMY  1940's  . TRANSTHORACIC ECHOCARDIOGRAM  05/01/2017   mild concentric LVH, ef 19-37%, grade 1 diastolic dysfunction/ trivial AR/ mild dilated ascending aorta/ mild to moderate MR (no stenosis)/ mild LAE/ mild TR/ mild to moderate increase PASP 53mmHg     Current Outpatient Medications  Medication Sig Dispense Refill  . allopurinol (ZYLOPRIM) 300 MG tablet Take 300 mg by mouth every morning.     . celecoxib (CELEBREX) 200 MG capsule Take 200 mg by mouth 2 (two) times daily as needed for moderate pain.     . furosemide (LASIX) 20 MG tablet Take 1 tablet (20 mg total) by mouth daily. 90 tablet 3  . HYDROcodone-acetaminophen (NORCO) 7.5-325 MG tablet Take 1 tablet by mouth every 6 (six) hours as needed for moderate pain. 30 tablet 0  . XARELTO 20 MG TABS tablet TAKE 1 TABLET BY MOUTH EVERY DAY WITH SUPPER 90 tablet 1  . olmesartan (BENICAR) 20 MG tablet Take 0.5 tablets (10 mg total) by mouth daily. 15 tablet 3   No current facility-administered medications for this visit.     Allergies:   Patient has no known allergies.    Social History:  The patient  reports that he quit smoking about 46 years ago. His smoking use included cigarettes. He  has a 20.00 pack-year smoking history. He has never used smokeless tobacco. He reports current alcohol use of about 14.0 standard drinks of alcohol per week. He reports that he does not use drugs.   Family History:  The patient's family history includes Aneurysm in his father; Heart disease in his unknown relative; Macular degeneration in his mother; Osteoarthritis in his mother.    ROS: All other systems are reviewed and negative. Unless otherwise mentioned in H&P    PHYSICAL EXAM: VS:  BP (!) 170/84   Pulse 97   Ht 6' (1.829 m)   Wt 237 lb (107.5 kg)   BMI 32.14 kg/m  , BMI Body mass index is 32.14 kg/m.  GEN: Well nourished, well developed, in no acute distress HEENT: normal Neck: no JVD, carotid bruits, or masses Cardiac:RRR; no murmurs, rubs, or gallops, 2+-3+ pretibial edema  Respiratory:  Clear to auscultation bilaterally, normal work of breathing GI: soft, nontender, nondistended, + BS. Obese  MS: no deformity or atrophy, kyphoses is noted. Skin: warm and dry, no rash Neuro:  Strength and sensation are intact Psych: Angry and irritable.    EKG:  Not competed this office visit.   Recent Labs: 10/23/2017: ALT 10 12/23/2017: BUN 18; Creatinine, Ser 0.95; Hemoglobin 12.9; Platelets 207; Potassium 4.5; Sodium 140    Lipid Panel No results found for: CHOL, TRIG, HDL, CHOLHDL, VLDL, LDLCALC, LDLDIRECT    Wt Readings from Last 3 Encounters:  07/08/18 237 lb (107.5 kg)  12/23/17 215 lb (97.5 kg)  11/10/17 222 lb (100.7 kg)    Other studies Reviewed: Echocardiogram 11/06/17 Left ventricle: The cavity size was normal. Wall thickness was   increased in a pattern of moderate LVH. Systolic function was   normal. The estimated ejection fraction was 55%. Left ventricular   diastolic function parameters were normal. - Mitral valve: There was mild regurgitation. - Pulmonary arteries: PA peak pressure: 31 mm Hg (S).  ASSESSMENT AND PLAN:  1. Chronic LEE: Worsening over the  last two weeks, L>R. He denies pain in the legs. They feel heavy when he walks. He has only taken one dose of the lasix 20 mg. I have advised him to take a daily dose of lasix from now on until he is seen again. He is to avoid salt and to keep his legs elevated as much as possible . He is not inclined to use support hose. I will check CBC for anemia. I noticed that he was on iron in the past but is no longer taking this.   2. Hypertension: He was taken off of amlodipine and lisinopril for unknown reasons. He does not know why either. Three is no documented intolerance. I will start him on olmesartan 10 mg daily for better BP control as he is elevated today on initial assessment and on recheck by myself in the exam room. I will check BMET for kidney function on lasix.    3. Dyspnea: I am checking CBC. This may be related to elevated BP as well. Echo in May of 2019 determined normal EF. Can consider ischemic evaluation if symptoms persist or sleep study testing.  4. Hx of PE: The dyspnea may be a sequela of PE. He remains on Xarelto.   5. ETOH Abuse: This is not confirmed. Wife reported to nurse that he drinks a lot of alcohol.  Current medicines are reviewed at length with the patient today.  He only wants to see [name] on follow up.   Labs/ tests ordered today include: CBC. BMET, BNP.  Phill Myron. [place] Pugh, ANP, AACC   07/08/2018 12:23 PM    [place] [address] Office [phone] Fax [phone]

## 2018-07-09 ENCOUNTER — Inpatient Hospital Stay (HOSPITAL_COMMUNITY)
Admission: EM | Admit: 2018-07-09 | Discharge: 2018-07-11 | DRG: 378 | Disposition: A | Discharge status: Home / Self Care | Payer: PPO | Attending: Family Medicine | Admitting: Family Medicine

## 2018-07-09 ENCOUNTER — Encounter (HOSPITAL_COMMUNITY): Payer: Self-pay

## 2018-07-09 ENCOUNTER — Emergency Department (HOSPITAL_COMMUNITY): Payer: PPO

## 2018-07-09 ENCOUNTER — Other Ambulatory Visit: Payer: Self-pay

## 2018-07-09 DIAGNOSIS — I452 Bifascicular block: Secondary | ICD-10-CM | POA: Diagnosis present

## 2018-07-09 DIAGNOSIS — K2270 Barrett's esophagus without dysplasia: Secondary | ICD-10-CM | POA: Diagnosis present

## 2018-07-09 DIAGNOSIS — Z8371 Family history of colonic polyps: Secondary | ICD-10-CM | POA: Diagnosis not present

## 2018-07-09 DIAGNOSIS — Z87891 Personal history of nicotine dependence: Secondary | ICD-10-CM | POA: Diagnosis not present

## 2018-07-09 DIAGNOSIS — Z7901 Long term (current) use of anticoagulants: Secondary | ICD-10-CM | POA: Diagnosis not present

## 2018-07-09 DIAGNOSIS — K922 Gastrointestinal hemorrhage, unspecified: Secondary | ICD-10-CM

## 2018-07-09 DIAGNOSIS — I2510 Atherosclerotic heart disease of native coronary artery without angina pectoris: Secondary | ICD-10-CM | POA: Diagnosis present

## 2018-07-09 DIAGNOSIS — Z8042 Family history of malignant neoplasm of prostate: Secondary | ICD-10-CM | POA: Diagnosis not present

## 2018-07-09 DIAGNOSIS — K627 Radiation proctitis: Secondary | ICD-10-CM | POA: Diagnosis present

## 2018-07-09 DIAGNOSIS — D509 Iron deficiency anemia, unspecified: Secondary | ICD-10-CM | POA: Diagnosis not present

## 2018-07-09 DIAGNOSIS — Z860100 Personal history of colon polyps, unspecified: Secondary | ICD-10-CM

## 2018-07-09 DIAGNOSIS — Z961 Presence of intraocular lens: Secondary | ICD-10-CM | POA: Diagnosis present

## 2018-07-09 DIAGNOSIS — D649 Anemia, unspecified: Secondary | ICD-10-CM

## 2018-07-09 DIAGNOSIS — R0609 Other forms of dyspnea: Secondary | ICD-10-CM

## 2018-07-09 DIAGNOSIS — M109 Gout, unspecified: Secondary | ICD-10-CM | POA: Diagnosis present

## 2018-07-09 DIAGNOSIS — I272 Pulmonary hypertension, unspecified: Secondary | ICD-10-CM | POA: Diagnosis not present

## 2018-07-09 DIAGNOSIS — K219 Gastro-esophageal reflux disease without esophagitis: Secondary | ICD-10-CM | POA: Diagnosis present

## 2018-07-09 DIAGNOSIS — K5521 Angiodysplasia of colon with hemorrhage: Secondary | ICD-10-CM | POA: Diagnosis not present

## 2018-07-09 DIAGNOSIS — K5730 Diverticulosis of large intestine without perforation or abscess without bleeding: Secondary | ICD-10-CM | POA: Diagnosis not present

## 2018-07-09 DIAGNOSIS — Z9842 Cataract extraction status, left eye: Secondary | ICD-10-CM | POA: Diagnosis not present

## 2018-07-09 DIAGNOSIS — Z86711 Personal history of pulmonary embolism: Secondary | ICD-10-CM

## 2018-07-09 DIAGNOSIS — R0602 Shortness of breath: Secondary | ICD-10-CM | POA: Diagnosis not present

## 2018-07-09 DIAGNOSIS — K449 Diaphragmatic hernia without obstruction or gangrene: Secondary | ICD-10-CM | POA: Diagnosis present

## 2018-07-09 DIAGNOSIS — Z85038 Personal history of other malignant neoplasm of large intestine: Secondary | ICD-10-CM | POA: Diagnosis not present

## 2018-07-09 DIAGNOSIS — D122 Benign neoplasm of ascending colon: Secondary | ICD-10-CM | POA: Diagnosis not present

## 2018-07-09 DIAGNOSIS — I714 Abdominal aortic aneurysm, without rupture: Secondary | ICD-10-CM | POA: Diagnosis not present

## 2018-07-09 DIAGNOSIS — M479 Spondylosis, unspecified: Secondary | ICD-10-CM | POA: Diagnosis present

## 2018-07-09 DIAGNOSIS — K648 Other hemorrhoids: Secondary | ICD-10-CM | POA: Diagnosis not present

## 2018-07-09 DIAGNOSIS — Z9841 Cataract extraction status, right eye: Secondary | ICD-10-CM | POA: Diagnosis not present

## 2018-07-09 DIAGNOSIS — R195 Other fecal abnormalities: Secondary | ICD-10-CM | POA: Diagnosis not present

## 2018-07-09 DIAGNOSIS — I10 Essential (primary) hypertension: Secondary | ICD-10-CM | POA: Diagnosis not present

## 2018-07-09 DIAGNOSIS — Z8719 Personal history of other diseases of the digestive system: Secondary | ICD-10-CM | POA: Insufficient documentation

## 2018-07-09 DIAGNOSIS — K635 Polyp of colon: Secondary | ICD-10-CM | POA: Diagnosis present

## 2018-07-09 DIAGNOSIS — R600 Localized edema: Secondary | ICD-10-CM | POA: Insufficient documentation

## 2018-07-09 DIAGNOSIS — Z86718 Personal history of other venous thrombosis and embolism: Secondary | ICD-10-CM | POA: Diagnosis not present

## 2018-07-09 DIAGNOSIS — R0600 Dyspnea, unspecified: Secondary | ICD-10-CM | POA: Diagnosis present

## 2018-07-09 DIAGNOSIS — I739 Peripheral vascular disease, unspecified: Secondary | ICD-10-CM | POA: Diagnosis present

## 2018-07-09 DIAGNOSIS — Z923 Personal history of irradiation: Secondary | ICD-10-CM

## 2018-07-09 DIAGNOSIS — D500 Iron deficiency anemia secondary to blood loss (chronic): Secondary | ICD-10-CM | POA: Diagnosis present

## 2018-07-09 DIAGNOSIS — K921 Melena: Secondary | ICD-10-CM | POA: Diagnosis not present

## 2018-07-09 LAB — CBC WITH DIFFERENTIAL/PLATELET
Abs Immature Granulocytes: 0.02 10*3/uL (ref 0.00–0.07)
Basophils Absolute: 0 10*3/uL (ref 0.0–0.1)
Basophils Relative: 1 %
Eosinophils Absolute: 0.1 10*3/uL (ref 0.0–0.5)
Eosinophils Relative: 2 %
HCT: 22.1 % — ABNORMAL LOW (ref 39.0–52.0)
Hemoglobin: 5.9 g/dL — CL (ref 13.0–17.0)
Immature Granulocytes: 1 %
LYMPHS PCT: 13 %
Lymphs Abs: 0.6 10*3/uL — ABNORMAL LOW (ref 0.7–4.0)
MCH: 22 pg — AB (ref 26.0–34.0)
MCHC: 26.7 g/dL — ABNORMAL LOW (ref 30.0–36.0)
MCV: 82.5 fL (ref 80.0–100.0)
Monocytes Absolute: 0.6 10*3/uL (ref 0.1–1.0)
Monocytes Relative: 13 %
NEUTROS PCT: 70 %
Neutro Abs: 3 10*3/uL (ref 1.7–7.7)
Platelets: 249 10*3/uL (ref 150–400)
RBC: 2.68 MIL/uL — ABNORMAL LOW (ref 4.22–5.81)
RDW: 16.4 % — AB (ref 11.5–15.5)
WBC: 4.2 10*3/uL (ref 4.0–10.5)
nRBC: 0 % (ref 0.0–0.2)

## 2018-07-09 LAB — COMPREHENSIVE METABOLIC PANEL
ALBUMIN: 4 g/dL (ref 3.5–5.0)
ALT: 10 U/L (ref 0–44)
AST: 25 U/L (ref 15–41)
Alkaline Phosphatase: 51 U/L (ref 38–126)
Anion gap: 11 (ref 5–15)
BUN: 27 mg/dL — ABNORMAL HIGH (ref 8–23)
CHLORIDE: 106 mmol/L (ref 98–111)
CO2: 22 mmol/L (ref 22–32)
Calcium: 8.3 mg/dL — ABNORMAL LOW (ref 8.9–10.3)
Creatinine, Ser: 0.94 mg/dL (ref 0.61–1.24)
GFR calc Af Amer: >60 mL/min (ref >60)
GFR calc non Af Amer: >60 mL/min (ref >60)
GLUCOSE: 91 mg/dL (ref 70–99)
Potassium: 4.7 mmol/L (ref 3.5–5.1)
SODIUM: 139 mmol/L (ref 135–145)
Total Bilirubin: 0.5 mg/dL (ref 0.3–1.2)
Total Protein: 6.6 g/dL (ref 6.5–8.1)

## 2018-07-09 LAB — BASIC METABOLIC PANEL
BUN/Creatinine Ratio: 28 — ABNORMAL HIGH (ref 10–24)
BUN: 25 mg/dL (ref 8–27)
CALCIUM: 8.1 mg/dL — AB (ref 8.6–10.2)
CHLORIDE: 108 mmol/L — AB (ref 96–106)
CO2: 20 mmol/L (ref 20–29)
Creatinine, Ser: 0.88 mg/dL (ref 0.76–1.27)
GFR calc Af Amer: 95 mL/min/{1.73_m2} (ref >59)
GFR calc non Af Amer: 82 mL/min/{1.73_m2} (ref >59)
Glucose: 95 mg/dL (ref 65–99)
POTASSIUM: 4.5 mmol/L (ref 3.5–5.2)
Sodium: 143 mmol/L (ref 134–144)

## 2018-07-09 LAB — CBC
Hematocrit: 20.2 % — ABNORMAL LOW (ref 37.5–51.0)
Hemoglobin: 5.6 g/dL — CL (ref 13.0–17.7)
MCH: 21.7 pg — ABNORMAL LOW (ref 26.6–33.0)
MCHC: 27.7 g/dL — ABNORMAL LOW (ref 31.5–35.7)
MCV: 78 fL — ABNORMAL LOW (ref 79–97)
PLATELETS: 288 10*3/uL (ref 150–450)
RBC: 2.58 x10E6/uL — AB (ref 4.14–5.80)
RDW: 14.8 % (ref 11.6–15.4)
WBC: 2.7 10*3/uL — AB (ref 3.4–10.8)

## 2018-07-09 LAB — POC OCCULT BLOOD, ED: Fecal Occult Bld: POSITIVE — AB

## 2018-07-09 LAB — PROTIME-INR
INR: 2.35
Prothrombin Time: 25.4 seconds — ABNORMAL HIGH (ref 11.4–15.2)

## 2018-07-09 LAB — PREPARE RBC (CROSSMATCH)

## 2018-07-09 LAB — BRAIN NATRIURETIC PEPTIDE: BNP: 100 pg/mL (ref 0.0–100.0)

## 2018-07-09 MED ORDER — SODIUM CHLORIDE 0.9 % IV SOLN
10.0000 mL/h | Freq: Once | INTRAVENOUS | Status: AC
  Administered 2018-07-09: 10 mL/h via INTRAVENOUS

## 2018-07-09 MED ORDER — SODIUM CHLORIDE 0.9% FLUSH: 3.0000 mL | INTRAVENOUS | Status: DC | PRN

## 2018-07-09 MED ORDER — PANTOPRAZOLE SODIUM 40 MG IV SOLR
40.0000 mg | Freq: Two times a day (BID) | INTRAVENOUS | Status: DC
  Administered 2018-07-09 – 2018-07-11 (×4): 40 mg via INTRAVENOUS
  Filled 2018-07-09 (×4): qty 40

## 2018-07-09 MED ORDER — HYDROCODONE-ACETAMINOPHEN 7.5-325 MG PO TABS: 1.0000 | ORAL_TABLET | Freq: Four times a day (QID) | ORAL | Status: DC | PRN

## 2018-07-09 MED ORDER — FUROSEMIDE 10 MG/ML IJ SOLN
40.0000 mg | Freq: Every day | INTRAMUSCULAR | Status: DC
  Administered 2018-07-10: 40 mg via INTRAVENOUS
  Filled 2018-07-09: qty 4

## 2018-07-09 MED ORDER — ACETAMINOPHEN 650 MG RE SUPP: 650.0000 mg | Freq: Four times a day (QID) | RECTAL | Status: DC | PRN

## 2018-07-09 MED ORDER — SODIUM CHLORIDE 0.9 % IV SOLN: 250.0000 mL | INTRAVENOUS | Status: DC | PRN

## 2018-07-09 MED ORDER — ALLOPURINOL 300 MG PO TABS
300.0000 mg | ORAL_TABLET | Freq: Every morning | ORAL | Status: DC
  Administered 2018-07-10 – 2018-07-11 (×2): 300 mg via ORAL
  Filled 2018-07-09 (×2): qty 1

## 2018-07-09 MED ORDER — ONDANSETRON HCL 4 MG PO TABS: 4.0000 mg | ORAL_TABLET | Freq: Four times a day (QID) | ORAL | Status: DC | PRN

## 2018-07-09 MED ORDER — SODIUM CHLORIDE 0.9% FLUSH
3.0000 mL | Freq: Two times a day (BID) | INTRAVENOUS | Status: DC
  Administered 2018-07-09 – 2018-07-10 (×2): 3 mL via INTRAVENOUS

## 2018-07-09 MED ORDER — ONDANSETRON HCL 4 MG/2ML IJ SOLN: 4.0000 mg | Freq: Four times a day (QID) | INTRAMUSCULAR | Status: DC | PRN

## 2018-07-09 MED ORDER — ACETAMINOPHEN 325 MG PO TABS: 650.0000 mg | ORAL_TABLET | Freq: Four times a day (QID) | ORAL | Status: DC | PRN

## 2018-07-09 NOTE — ED Triage Notes (Addendum)
Patient was called this AM and was told his Hgb was 5.6. patient takes xarelto.

## 2018-07-09 NOTE — ED Notes (Signed)
Date and time results received: 07/09/18 1244   Test:hgb Critical Value: 5.9 Name of Provider Notified: Melina Copa Orders Received? Or Actions Taken?: no orders given

## 2018-07-09 NOTE — H&P (View-Only) (Signed)
Referring Provider:  Dr. Melina Copa, [facility] Primary Care Physician:  Deland Pretty, MD Primary Gastroenterologist:  Dr. Havery Moros  Reason for Consultation:  Anemia, heme positive stools  HPI: [name] is a 78 y.o. male with past medical history of coronary artery disease, history of pulmonary embolism and DVT on Xarelto amongst other history as listed below.  He reports that he has been short of breath for a couple of years but at least worsening over the last 6 weeks or so.  He ultimately went to his cardiologist who drew lab work and then called him today telling him he needed to come to the emergency department because his Hgb was 5.6 grams.  He was "trace" heme positive per the EDP.  He is getting his first unit of PRBC's.  Of note, he was hospitalized in May of 2019 and was anemic at that time with Hgb in the 7 gram range.  Iron studies were low, but was not placed on iron supplements, etc. then appears that hemoglobin bump back up to about 13 g from a reading in July 2019.  He had undergone EGD and colonoscopy in November 2016 per Dr. Havery Moros. He had Barrett's without dysplasia and possible GAVE. At colonoscopy had severe left colon diverticulosis and several colon polyps. He had a large 4 cm spreading polyp in the ascending colon which was tattooed but not removed. And also had APC done to an area of radiation proctitis.  He was subsequently referred to Dr. Arsenio Loader at [facility] for endoscopic mucosal resection of this lesion in the colon. This was done in March 2017-pathology showed fragments of tubular adenoma.  Last dose of Xarelto was this AM, 1/16.  INR also elevated at 2.35.    He denies any evidence of rectal bleeding.  Reports no black or tarry stools.  Denies abdominal pain, no nausea or vomiting.  Says that his appetite is good.  He reports sounds like a approximately 40 pound weight loss over the past year, but thinks it has stabilized recently.  Moves his bowels  regularly about every other day without any changes.  In regards to his GERD and his Barrett's esophagus it sounds like he was just on ranitidine and then since that recall a few months ago it was discontinued but he has not yet restarted any other type of acid suppression.  Does use Celebrex daily for back pain, but denies any other NSAID use.  Past Medical History:  Diagnosis Date  . AAA (abdominal aortic aneurysm) ([place]) followed by dr Stanford Breed   last duplex 07-17-2017  3.2 x 3.3  . Anticoagulant long-term use    xarelto for hx PEs and DVT  . Arthritis    back  . Barrett's esophagus 2009  . Bilateral lower extremity edema    chronic pedal  . CAD (coronary artery disease) cardioloigst--  dr Stanford Breed   cardiac cath 04-12-2014  single vessel obstructive cad involving ostrium D2, and other nonobstructive cad , normall lvf  . Chronic lower back pain   . 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[place])    per duplex 07-17-2017  left cia 2 x 2, right cia 2.1 x 2  . Diverticulosis   . Dyspnea    with exertion   . Dyspnea on exertion   . FH: colonic polyps   . GERD (gastroesophageal reflux disease)   . Gout    08-27-2017  per pt stable  . History of adenomatous polyp of colon   . History of DVT of  lower extremity    10/ 2015  LLE  . History of esophageal stricture    post dilation  . History of gastric polyp    benign  . History of pulmonary embolus (PE)    2005  treated 1 yr w/ coumadin and 10/ 2015  bilateral  . History of radiation therapy 06/22/2012-08/18/2012   78 Gy to prostate (external beam)  . History of subdural hematoma 03/2012   s/p  evacuation  . Hypertension   . Lesion of bladder    non canerous  . Prostate cancer [facility]) urologist-  dr wrenn/ oncologist- dr Sondra Come   dx 07/ 2013--  Stage T1c, [place] 7, vol 67cc--- completed external beam radiation therapy 08-18-2012  . Pulmonary HTN ([place]) 04/12/2014   last echo 05-01-2017  PASP 68mmHg  . RBBB (right bundle branch block with left  anterior fascicular block)    ocassional    Past Surgical History:  Procedure Laterality Date  . CATARACT EXTRACTION W/ INTRAOCULAR LENS  IMPLANT, BILATERAL  2018  . COLONOSCOPY WITH PROPOFOL N/A 04/25/2015   Procedure: COLONOSCOPY WITH PROPOFOL;  Surgeon: Manus Gunning, MD;  Location: [facility];  Service: Gastroenterology;  Laterality: N/A;  . CRANIOTOMY  04/03/2012   Procedure: CRANIOTOMY HEMATOMA EVACUATION SUBDURAL;  Surgeon: Erline Levine, MD;  Location: [facility];  Service: Neurosurgery;  Laterality: Left;  Left Craniotomy for Evacuation of Subdural Hematoma  . CYSTOSCOPY WITH BIOPSY N/A 09/02/2017   Procedure: CYSTOSCOPY WITH BIOPSY AND FULGURATION POSSIBLE TRANSURETHRAL RESECTION OF BLADDER TUMOR;  Surgeon: [name], MD;  Location: [facility];  Service: Urology;  Laterality: N/A;  . ESOPHAGOGASTRODUODENOSCOPY (EGD) WITH PROPOFOL N/A 04/25/2015   Procedure: ESOPHAGOGASTRODUODENOSCOPY (EGD) WITH PROPOFOL;  Surgeon: Manus Gunning, MD;  Location: [facility];  Service: Gastroenterology;  Laterality: N/A;  . IRRIGATION AND DEBRIDEMENT SEBACEOUS CYST  01-11-13   "off my back"  . KYPHOPLASTY N/A 12/23/2017   Procedure: Lumbar One KYPHOPLASTY;  Surgeon: Erline Levine, MD;  Location: [facility];  Service: Neurosurgery;  Laterality: N/A;  Lumbar One KYPHOPLASTY  . LEFT AND RIGHT HEART CATHETERIZATION WITH CORONARY ANGIOGRAM N/A 04/12/2014   Procedure: LEFT AND RIGHT HEART CATHETERIZATION WITH CORONARY ANGIOGRAM;  Surgeon: Peter M Martinique, MD;  Location: [facility] CATH LAB;  Service: Cardiovascular;  Laterality: N/A;  . ORIF FEMUR FRACTURE Left 10-02-2010   dr Alvan Dame   subtrochanteric  . TONSILLECTOMY  1940's  . TRANSTHORACIC ECHOCARDIOGRAM  05/01/2017   mild concentric LVH, ef 55-37%, grade 1 diastolic dysfunction/ trivial AR/ mild dilated ascending aorta/ mild to moderate MR (no stenosis)/ mild LAE/ mild TR/ mild to moderate increase PASP 74mmHg    Prior to Admission  medications   Medication Sig Start Date End Date Taking? Authorizing Provider  allopurinol (ZYLOPRIM) 300 MG tablet Take 300 mg by mouth every morning.    Yes [provider]  celecoxib (CELEBREX) 200 MG capsule Take 200 mg by mouth daily.  02/24/15  Yes [provider]  furosemide (LASIX) 20 MG tablet Take 1 tablet (20 mg total) by mouth daily. 07/06/18 10/04/18 Yes Lelon Perla, MD  HYDROcodone-acetaminophen (NORCO/VICODIN) 5-325 MG tablet Take 1 tablet by mouth 2 (two) times daily. 07/08/18  Yes [provider]  XARELTO 20 MG TABS tablet TAKE 1 TABLET BY MOUTH EVERY DAY WITH SUPPER Patient taking differently: Take 20 mg by mouth daily with supper.  03/12/18  Yes Lelon Perla, MD  HYDROcodone-acetaminophen (NORCO) 7.5-325 MG tablet Take 1 tablet by mouth every 6 (  six) hours as needed for moderate pain. Patient not taking: Reported on 07/09/2018 12/24/17   Erline Levine, MD  olmesartan (BENICAR) 20 MG tablet Take 0.5 tablets (10 mg total) by mouth daily. Patient not taking: Reported on 07/09/2018 07/08/18   Lendon Colonel, NP    Current Facility-Administered Medications  Medication Dose Route Frequency Provider Last Rate Last Dose  . 0.9 %  sodium chloride infusion  10 mL/hr Intravenous Once Hayden Rasmussen, MD       Current Outpatient Medications  Medication Sig Dispense Refill  . allopurinol (ZYLOPRIM) 300 MG tablet Take 300 mg by mouth every morning.     . celecoxib (CELEBREX) 200 MG capsule Take 200 mg by mouth daily.     . furosemide (LASIX) 20 MG tablet Take 1 tablet (20 mg total) by mouth daily. 90 tablet 3  . HYDROcodone-acetaminophen (NORCO/VICODIN) 5-325 MG tablet Take 1 tablet by mouth 2 (two) times daily.    Alveda Reasons 20 MG TABS tablet TAKE 1 TABLET BY MOUTH EVERY DAY WITH SUPPER (Patient taking differently: Take 20 mg by mouth daily with supper. ) 90 tablet 1  . HYDROcodone-acetaminophen (NORCO) 7.5-325 MG tablet Take 1 tablet by mouth every 6  (six) hours as needed for moderate pain. (Patient not taking: Reported on 07/09/2018) 30 tablet 0  . olmesartan (BENICAR) 20 MG tablet Take 0.5 tablets (10 mg total) by mouth daily. (Patient not taking: Reported on 07/09/2018) 15 tablet 3    Allergies as of 07/09/2018  . (No Known Allergies)    Family History  Problem Relation Age of Onset  . Macular degeneration Mother   . Osteoarthritis Mother   . Aneurysm Father        d/o 72 yo, ruptured AAA  . Heart disease Other        No family history  . Colon cancer Neg Hx   . Colon polyps Neg Hx   . Diabetes Neg Hx     Social History   Socioeconomic History  . Marital status: Married    Spouse name: Not on file  . Number of children: 3  . Years of education: Not on file  . Highest education level: Not on file  Occupational History  . 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 Financial resource strain: Not on file  . Food insecurity:    Worry: Not on file    Inability: Not on file  . Transportation needs:    Medical: Not on file    Non-medical: Not on file  Tobacco Use  . Smoking status: Former Smoker    Packs/day: 1.00    Years: 20.00    Pack years: 20.00    Types: Cigarettes    Last attempt to quit: 12/23/1971    Years since quitting: 46.5  . Smokeless tobacco: Never Used  Substance and Sexual Activity  . Alcohol use: Yes    Alcohol/week: 14.0 standard drinks    Types: 14 Cans of beer per week    Comment: 2 beer daily  . Drug use: No  . Sexual activity: Yes  Lifestyle  . Physical activity:    Days per week: Not on file    Minutes per session: Not on file  . Stress: Not on file  Relationships  . Social connections:    Talks on phone: Not on file    Gets together: Not on file    Attends religious service: Not  on file    Active member of club or organization: Not on file    Attends meetings of clubs or organizations: Not on file    Relationship status: Not on file   . Intimate partner violence:    Fear of current or ex partner: Not on file    Emotionally abused: Not on file    Physically abused: Not on file    Forced sexual activity: Not on file  Other Topics Concern  . Not on file  Social History Narrative   Originally from [place].  Retired to [employer] because he was tired of all the snow.    Review of Systems: ROS is O/W negative except as mentioned in HPI.  Physical Exam: Vital signs in last 24 hours: Temp:  [98 F (36.7 C)-98.3 F (36.8 C)] 98.1 F (36.7 C) (01/16 1444) Pulse Rate:  [63-84] 71 (01/16 1444) Resp:  [12-18] 15 (01/16 1444) BP: (144-161)/(69-78) 156/78 (01/16 1444) SpO2:  [96 %-100 %] 97 % (01/16 1444) Weight:  [104.3 kg] 104.3 kg (01/16 1001)   General:  Alert, Well-developed, well-nourished, pleasant and cooperative in NAD Head:  Normocephalic and atraumatic. Eyes:  Sclera clear, no icterus.  Conjunctiva pink. Ears:  Normal auditory acuity. Mouth:  No deformity or lesions.   Lungs:  Clear throughout to auscultation.  No wheezes, crackles, or rhonchi.  Heart:  Regular rate and rhythm; no murmurs, clicks, rubs, or gallops. Abdomen:  Soft, non-distended.  BS present.  Non-tender.  Rectal:  Deferred.  Was "trace" heme positive per the ED MD.  Msk:  Symmetrical without gross deformities. Pulses:  Normal pulses noted. Extremities:  Moderate B/L LE edema noted, slightly more on the left. Neurologic:  Alert and oriented x 4;  grossly normal neurologically. Skin:  Intact without significant lesions or rashes. Psych:  Alert and cooperative. Normal mood and affect.  Lab Results: Recent Labs    07/08/18 1203 07/09/18 1153  WBC 2.7* 4.2  HGB 5.6* 5.9*  HCT 20.2* 22.1*  PLT 288 249   BMET Recent Labs    07/08/18 1203 07/09/18 1153  NA 143 139  K 4.5 4.7  CL 108* 106  CO2 20 22  GLUCOSE 95 91  BUN 25 27*  CREATININE 0.88 0.94  CALCIUM 8.1* 8.3*   LFT Recent Labs    07/09/18 1153  PROT 6.6    ALBUMIN 4.0  AST 25  ALT 10  ALKPHOS 51  BILITOT 0.5   PT/INR Recent Labs    07/09/18 1321  LABPROT 25.4*  INR 2.35   Studies/Results: Dg Chest 2 View  Result Date: 07/09/2018 CLINICAL DATA:  Shortness of breath worsening over last 2 years. EXAM: CHEST - 2 VIEW COMPARISON:  None. FINDINGS: The heart size and mediastinal contours are stable. The aorta is tortuous. Heart size is enlarged. Mild scar is identified in the right lung base. There is no focal infiltrate, pulmonary edema, or pleural effusion. The visualized skeletal structures are stable. IMPRESSION: No active cardiopulmonary disease. Electronically Signed   By: Abelardo Diesel M.D.   On: 07/09/2018 11:33   IMPRESSION:  #1  Symptomatic anemia:  Hgb 5.6/5.9 grams with "trace" heme positive stools.  No overt bleeding noted.  Receiving first unit PRBC's now.  Had anemia during hospitalization back in May with hemoglobin in the 7 g range, but then hemoglobin seem to trend back up and was at 13 g in July 2019. #2  History of multiple adenomatous colon polyps and 4 cm ascending  colon mass removed by EMR per Dr. Arsenio Loader 08/2015-was supposed to have follow-up colonoscopy in one year. #3  coronary artery disease #4  history of DVT/PE on chronic Xarelto #5 Coagulopathy:  INR 2.35 on this admission, guessing related to the Xarelto. #6 Barretts esophagus no dysplasia- last EGD 11/ 2016.  Was due for recall 04/2018.  Not on any acid reducing medication recently and only on ranitidine for a while prior to the recall on that.  No recent PPI use. #7  NSAID use with celebrex daily  PLAN: -Will plan for EGD and colonoscopy as inpatient once Xarelto washes out for 2 days and INR normalizes. -Patient would like for his cardiologist, Dr. Stanford Breed, to be aware about holding Xarelto, etc if the hospitalist would kindly reach out to him. -Will give a heart healthy diet for now as we do not anticipate procedures any earlier than Saturday.  [name].  [place]  07/09/2018, 2:45 PM    ________________________________________________________________________  Velora Heckler GI MD note:  I personally examined the patient, reviewed the data and agree with the assessment and plan described above.  His Hb has bumped from 5.6 to 7 after 2 units.  He's had zero overt bleeding and no specific GI symptoms.  We discussed colonoscopy +/- EGD tomorrow which would be 48 hours after his last xarelto.  He understands and agrees.    I will order one more unit of prbc, prep, diet clears for now.   Owens Loffler, MD [facility] Pager [phone]

## 2018-07-09 NOTE — ED Provider Notes (Signed)
[facility] DEPT Provider Note   CSN: [id number] Arrival date & time: 07/09/18  4098     History   Chief Complaint Chief Complaint  Patient presents with  . Abnormal Lab    HPI [name] is a 78 y.o. male.  He is on Xarelto for her former history of PEs DVTs.  He said he has been short of breath for a couple years but at least worsening over the last 6 weeks or so.  He ultimately went to his PCP who drew lab work and then called him today telling him he needed to come to the emergency department.  His shortness of breath is worse with exertion.  He denies any chest pain fevers chills nausea vomiting.  He is seen no blood in his stools or urine.  He said he needed a blood transfusion about 6 months ago.  His chronic swelling of his lower extremities but he says this is worse.  The history is provided by the patient.  Abnormal Lab  Time since result:  24 hours Patient referred by:  PCP Result type: hematology   Hematology:    Hematocrit:  Low   Hemoglobin:  Low Shortness of Breath  Severity:  Moderate Onset quality:  Gradual Timing:  Constant Progression:  Worsening Chronicity:  New Context: activity   Relieved by:  Rest Worsened by:  Activity Ineffective treatments:  None tried Associated symptoms: no abdominal pain, no chest pain, no cough, no fever, no headaches, no neck pain, no rash, no sore throat, no sputum production and no vomiting   Risk factors: hx of PE/DVT     Past Medical History:  Diagnosis Date  . AAA (abdominal aortic aneurysm) ([place]) followed by dr Stanford Breed   last duplex 07-17-2017  3.2 x 3.3  . Anticoagulant long-term use    xarelto for hx PEs and DVT  . Arthritis    back  . Barrett's esophagus 2009  . Bilateral lower extremity edema    chronic pedal  . CAD (coronary artery disease) cardioloigst--  dr Stanford Breed   cardiac cath 04-12-2014  single vessel obstructive cad involving ostrium D2, and other nonobstructive  cad , normall lvf  . Chronic lower back pain   . 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[place])    per duplex 07-17-2017  left cia 2 x 2, right cia 2.1 x 2  . Diverticulosis   . Dyspnea    with exertion   . Dyspnea on exertion   . FH: colonic polyps   . GERD (gastroesophageal reflux disease)   . Gout    08-27-2017  per pt stable  . History of adenomatous polyp of colon   . History of DVT of lower extremity    10/ 2015  LLE  . History of esophageal stricture    post dilation  . History of gastric polyp    benign  . History of pulmonary embolus (PE)    2005  treated 1 yr w/ coumadin and 10/ 2015  bilateral  . History of radiation therapy 06/22/2012-08/18/2012   78 Gy to prostate (external beam)  . History of subdural hematoma 03/2012   s/p  evacuation  . Hypertension   . Lesion of bladder    non canerous  . Prostate cancer [facility]) urologist-  dr wrenn/ oncologist- dr Sondra Come   dx 07/ 2013--  Stage T1c, Gleson 7, vol 67cc--- completed external beam radiation therapy 08-18-2012  . Pulmonary HTN ([place]) 04/12/2014   last echo 05-01-2017  PASP  39mmHg  . RBBB (right bundle branch block with left anterior fascicular block)    ocassional    Patient Active Problem List   Diagnosis Date Noted  . Lumbar compression fracture ([place]) 12/23/2017  . Near syncope 10/23/2017  . History of colonic polyps   . Diarrhea 07/01/2014  . Rectal bleeding 06/28/2014  . Positive blood cultures 05/26/2014  . Bacteremia due to Gram-positive bacteria 05/26/2014  . Leukopenia 05/26/2014  . Neutropenia ([place]) 05/26/2014  . DVT, lower extremity, distal, acute ([place]) 04/19/2014  . Bilateral pulmonary embolism ([place]) 04/14/2014  . Acute pulmonary embolism ([place]) 04/14/2014  . Pulmonary HTN ([place]) 04/12/2014  . Infected sebaceous cyst back 7x5x4cm s/p I&D [id number] 01/11/2013  . Abdominal aortic aneurysm ([place]) 05/25/2012  . CAD (coronary artery disease) 05/25/2012  . Dyspnea 03/30/2012  . Hypertension 03/30/2012  . Bruit  03/30/2012  . Prostate cancer ([place]) 02/03/2012  . GERD 11/26/2007  . ADENOCARCINOMA, COLON 11/25/2007  . Gout 11/25/2007  . ESOPHAGEAL STRICTURE 11/25/2007  . BARRETTS ESOPHAGUS 11/25/2007  . DUODENITIS, WITHOUT HEMORRHAGE 11/25/2007  . DIVERTICULOSIS OF COLON 11/25/2007  . ABDOMINAL PAIN, EPIGASTRIC 11/25/2007  . FOREIGN BODY IN ESOPHAGUS 11/25/2007  . COLONIC POLYPS, ADENOMATOUS, HX OF 11/25/2007    Past Surgical History:  Procedure Laterality Date  . CATARACT EXTRACTION W/ INTRAOCULAR LENS  IMPLANT, BILATERAL  2018  . COLONOSCOPY WITH PROPOFOL N/A 04/25/2015   Procedure: COLONOSCOPY WITH PROPOFOL;  Surgeon: Manus Gunning, MD;  Location: [facility];  Service: Gastroenterology;  Laterality: N/A;  . CRANIOTOMY  04/03/2012   Procedure: CRANIOTOMY HEMATOMA EVACUATION SUBDURAL;  Surgeon: Erline Levine, MD;  Location: [facility];  Service: Neurosurgery;  Laterality: Left;  Left Craniotomy for Evacuation of Subdural Hematoma  . CYSTOSCOPY WITH BIOPSY N/A 09/02/2017   Procedure: CYSTOSCOPY WITH BIOPSY AND FULGURATION POSSIBLE TRANSURETHRAL RESECTION OF BLADDER TUMOR;  Surgeon: [name], MD;  Location: [facility];  Service: Urology;  Laterality: N/A;  . ESOPHAGOGASTRODUODENOSCOPY (EGD) WITH PROPOFOL N/A 04/25/2015   Procedure: ESOPHAGOGASTRODUODENOSCOPY (EGD) WITH PROPOFOL;  Surgeon: Manus Gunning, MD;  Location: [facility];  Service: Gastroenterology;  Laterality: N/A;  . IRRIGATION AND DEBRIDEMENT SEBACEOUS CYST  01-11-13   "off my back"  . KYPHOPLASTY N/A 12/23/2017   Procedure: Lumbar One KYPHOPLASTY;  Surgeon: Erline Levine, MD;  Location: [facility];  Service: Neurosurgery;  Laterality: N/A;  Lumbar One KYPHOPLASTY  . LEFT AND RIGHT HEART CATHETERIZATION WITH CORONARY ANGIOGRAM N/A 04/12/2014   Procedure: LEFT AND RIGHT HEART CATHETERIZATION WITH CORONARY ANGIOGRAM;  Surgeon: Peter M Martinique, MD;  Location: [facility] CATH LAB;  Service: Cardiovascular;  Laterality:  N/A;  . ORIF FEMUR FRACTURE Left 10-02-2010   dr Alvan Dame   subtrochanteric  . TONSILLECTOMY  1940's  . TRANSTHORACIC ECHOCARDIOGRAM  05/01/2017   mild concentric LVH, ef 67-59%, grade 1 diastolic dysfunction/ trivial AR/ mild dilated ascending aorta/ mild to moderate MR (no stenosis)/ mild LAE/ mild TR/ mild to moderate increase PASP 4mmHg        Home Medications    Prior to Admission medications   Medication Sig Start Date End Date Taking? Authorizing Provider  allopurinol (ZYLOPRIM) 300 MG tablet Take 300 mg by mouth every morning.    Yes [provider]  celecoxib (CELEBREX) 200 MG capsule Take 200 mg by mouth daily.  02/24/15  Yes [provider]  furosemide (LASIX) 20 MG tablet Take 1 tablet (20 mg total) by mouth daily. 07/06/18 10/04/18 Yes Lelon Perla, MD  HYDROcodone-acetaminophen (NORCO/VICODIN) [phone]  MG tablet Take 1 tablet by mouth 2 (two) times daily. 07/08/18  Yes [provider]  XARELTO 20 MG TABS tablet TAKE 1 TABLET BY MOUTH EVERY DAY WITH SUPPER Patient taking differently: Take 20 mg by mouth daily with supper.  03/12/18  Yes Lelon Perla, MD  HYDROcodone-acetaminophen (NORCO) 7.5-325 MG tablet Take 1 tablet by mouth every 6 (six) hours as needed for moderate pain. Patient not taking: Reported on 07/09/2018 12/24/17   Erline Levine, MD  olmesartan (BENICAR) 20 MG tablet Take 0.5 tablets (10 mg total) by mouth daily. Patient not taking: Reported on 07/09/2018 07/08/18   Lendon Colonel, NP    Family History Family History  Problem Relation Age of Onset  . Macular degeneration Mother   . Osteoarthritis Mother   . Aneurysm Father        d/o 5 yo, ruptured AAA  . Heart disease Other        No family history  . Colon cancer Neg Hx   . Colon polyps Neg Hx   . Diabetes Neg Hx     Social History Social History   Tobacco Use  . Smoking status: Former Smoker    Packs/day: 1.00    Years: 20.00    Pack years: 20.00    Types:  Cigarettes    Last attempt to quit: 12/23/1971    Years since quitting: 46.5  . Smokeless tobacco: Never Used  Substance Use Topics  . Alcohol use: Yes    Alcohol/week: 14.0 standard drinks    Types: 14 Cans of beer per week    Comment: 2 beer daily  . Drug use: No     Allergies   Patient has no known allergies.   Review of Systems Review of Systems  Constitutional: Negative for fever.  HENT: Negative for sore throat.   Eyes: Negative for visual disturbance.  Respiratory: Positive for shortness of breath. Negative for cough and sputum production.   Cardiovascular: Positive for leg swelling. Negative for chest pain.  Gastrointestinal: Negative for abdominal pain, blood in stool, nausea and vomiting.  Genitourinary: Negative for dysuria and hematuria.  Musculoskeletal: Negative for neck pain.  Skin: Negative for rash.  Neurological: Negative for headaches.     Physical Exam Updated Vital Signs BP (!) 144/71 (BP Location: Right Arm)   Pulse 77   Temp 98.3 F (36.8 C) (Oral)   Resp 18   Ht 6' (1.829 m)   Wt 104.3 kg   SpO2 100%   BMI 31.19 kg/m   Physical Exam Vitals signs and nursing note reviewed.  Constitutional:      Appearance: He is well-developed.  HENT:     Head: Normocephalic and atraumatic.  Eyes:     Conjunctiva/sclera: Conjunctivae normal.  Neck:     Musculoskeletal: Neck supple.  Cardiovascular:     Rate and Rhythm: Normal rate and regular rhythm.     Heart sounds: No murmur.  Pulmonary:     Effort: Pulmonary effort is normal. No respiratory distress.     Breath sounds: Normal breath sounds.  Abdominal:     Palpations: Abdomen is soft.     Tenderness: There is no abdominal tenderness.  Musculoskeletal: Normal range of motion.        General: No tenderness.     Right lower leg: Edema present.     Left lower leg: Edema present.  Skin:    General: Skin is warm and dry.     Capillary Refill: Capillary refill  takes less than 2 seconds.    Neurological:     General: No focal deficit present.     Mental Status: He is alert and oriented to person, place, and time.      ED Treatments / Results  Labs (all labs ordered are listed, but only abnormal results are displayed) Labs Reviewed  COMPREHENSIVE METABOLIC PANEL - Abnormal; Notable for the following components:      Result Value   BUN 27 (*)    Calcium 8.3 (*)    All other components within normal limits  CBC WITH DIFFERENTIAL/PLATELET - Abnormal; Notable for the following components:   RBC 2.68 (*)    Hemoglobin 5.9 (*)    HCT 22.1 (*)    MCH 22.0 (*)    MCHC 26.7 (*)    RDW 16.4 (*)    Lymphs Abs 0.6 (*)    All other components within normal limits  PROTIME-INR - Abnormal; Notable for the following components:   Prothrombin Time 25.4 (*)    All other components within normal limits  POC OCCULT BLOOD, ED - Abnormal; Notable for the following components:   Fecal Occult Bld POSITIVE (*)    All other components within normal limits  TYPE AND SCREEN  PREPARE RBC (CROSSMATCH)    EKG EKG Interpretation  Date/Time:  Thursday July 09 2018 11:11:43 EST Ventricular Rate:  66 PR Interval:    QRS Duration: 162 QT Interval:  466 QTC Calculation: 489 R Axis:   -61 Text Interpretation:  Sinus rhythm RBBB and LAFB similar to prior 5/19 Confirmed by Aletta Edouard [phone]) on 07/09/2018 11:17:35 AM Also confirmed by Aletta Edouard [phone]), editor Shon Hale [phone])  on 07/09/2018 12:47:19 PM   Radiology Dg Chest 2 View  Result Date: 07/09/2018 CLINICAL DATA:  Shortness of breath worsening over last 2 years. EXAM: CHEST - 2 VIEW COMPARISON:  None. FINDINGS: The heart size and mediastinal contours are stable. The aorta is tortuous. Heart size is enlarged. Mild scar is identified in the right lung base. There is no focal infiltrate, pulmonary edema, or pleural effusion. The visualized skeletal structures are stable. IMPRESSION: No active cardiopulmonary disease.  Electronically Signed   By: Abelardo Diesel M.D.   On: 07/09/2018 11:33    Procedures .Critical Care Performed by: Hayden Rasmussen, MD Authorized by: Hayden Rasmussen, MD   Critical care provider statement:    Critical care time (minutes):  45   Critical care time was exclusive of:  Separately billable procedures and treating other patients   Critical care was necessary to treat or prevent imminent or life-threatening deterioration of the following conditions:  Circulatory failure   Critical care was time spent personally by me on the following activities:  Discussions with consultants, evaluation of patient's response to treatment, examination of patient, ordering and performing treatments and interventions, ordering and review of laboratory studies, ordering and review of radiographic studies, pulse oximetry, re-evaluation of patient's condition, obtaining history from patient or surrogate, review of old charts and development of treatment plan with patient or surrogate   I assumed direction of critical care for this patient from another provider in my specialty: no     (including critical care time)  Medications Ordered in ED Medications - No data to display   Initial Impression / Assessment and Plan / ED Course  I have reviewed the triage vital signs and the nursing notes.  Pertinent labs & imaging results that were available during my care of the patient  were reviewed by me and considered in my medical decision making (see chart for details).  Clinical Course as of Jul 09 1510  Thu Jul 09, 2018  1325 Rectal done normal sphincter tone no gross blood guaiac sample sent to lab.   [MB]  1674 Discussed with [facility] hospitalist Dr. Louanne Belton.  He asked if I would make the GI service aware of the patient.   [MB]  2552 Discussed with [facility] who will evaluate the patient in consult.   [MB]    Clinical Course User Index [MB] Hayden Rasmussen, MD     Final Clinical Impressions(s)  / ED Diagnoses   Final diagnoses:  SOB (shortness of breath)  Symptomatic anemia  Heme positive stool    ED Discharge Orders    None       Hayden Rasmussen, MD 07/09/18 1514

## 2018-07-09 NOTE — Telephone Encounter (Signed)
Called by [employer] for a critical value: Hemoglobin 5.6 on CBC done yesterday @ 12:00. In reviewing chart, patient hemodynamically stable, but there was some concern for anemia contributing to worsening dyspnea. Hgb 6 months ago was > 12.   Called patient to inform him of these results and recommended that he come to the ER to have his CBC rechecked and to receive a blood transfusion and additional work up if needed. Patient stated he planned to present to [facility] ER this morning.   Will alert Dr. Jacalyn Lefevre office.   Ezmeralda Stefanick K. Marletta Lor, MD

## 2018-07-09 NOTE — Telephone Encounter (Signed)
Agree with plan for ER evaluation.  Hold Xarelto until seen in the emergency room.  Will need evaluation for anemia and transfusion. Kirk Ruths, MD

## 2018-07-09 NOTE — ED Notes (Signed)
RN Crystal attempted to stick pt without success

## 2018-07-09 NOTE — ED Notes (Signed)
RN notified of re-collect for PT/PTT/INR

## 2018-07-09 NOTE — ED Notes (Signed)
Patient transported to X-ray 

## 2018-07-09 NOTE — ED Notes (Signed)
Attempted to obtain IV. Unsuccessful times 2

## 2018-07-09 NOTE — ED Notes (Signed)
ED TO INPATIENT HANDOFF REPORT  Name/Age/Gender [name] 78 y.o. male  Code Status Code Status History    Date Active Date Inactive Code Status Order ID Comments User Context   12/23/2017 1902 12/24/2017 1315 Full Code [id number]  Erline Levine, MD Inpatient   10/23/2017 2356 10/27/2017 1913 Full Code [id number]  Reubin Milan, MD ED   10/23/2017 2356 10/23/2017 2356 Full Code [id number]  Reubin Milan, MD ED   05/26/2014 1716 05/27/2014 2123 Full Code [id number]  Lacy Duverney, PA-C Inpatient   04/12/2014 1058 04/26/2014 1510 Full Code [id number]  Martinique, Peter M, MD Inpatient      Home/SNF/Other Home  Chief Complaint Abnormal Labs  Level of Care/Admitting Diagnosis ED Disposition    ED Disposition Condition [facility] Area: [facility] [[id number]]  Level of Care: Telemetry [5]  Admit to tele based on following criteria: Complex arrhythmia (Bradycardia/Tachycardia)  Diagnosis: GI bleed [[id number]]  Admitting Physician: Flora Lipps [[id number]]  Attending Physician: Flora Lipps [[id number]]  Estimated length of stay: 3 - 4 days  Certification:: I certify this patient will need inpatient services for at least 2 midnights  PT Class (Do Not Modify): Inpatient [101]  PT Acc Code (Do Not Modify): Private [1]       Medical History Past Medical History:  Diagnosis Date  . AAA (abdominal aortic aneurysm) ([place]) followed by dr Stanford Breed   last duplex 07-17-2017  3.2 x 3.3  . Anticoagulant long-term use    xarelto for hx PEs and DVT  . Arthritis    back  . Barrett's esophagus 2009  . Bilateral lower extremity edema    chronic pedal  . CAD (coronary artery disease) cardioloigst--  dr Stanford Breed   cardiac cath 04-12-2014  single vessel obstructive cad involving ostrium D2, and other nonobstructive cad , normall lvf  . Chronic lower back pain   . 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[place])    per duplex 07-17-2017  left cia 2 x 2, right cia 2.1 x 2  .  Diverticulosis   . Dyspnea    with exertion   . Dyspnea on exertion   . FH: colonic polyps   . GERD (gastroesophageal reflux disease)   . Gout    08-27-2017  per pt stable  . History of adenomatous polyp of colon   . History of DVT of lower extremity    10/ 2015  LLE  . History of esophageal stricture    post dilation  . History of gastric polyp    benign  . History of pulmonary embolus (PE)    2005  treated 1 yr w/ coumadin and 10/ 2015  bilateral  . History of radiation therapy 06/22/2012-08/18/2012   78 Gy to prostate (external beam)  . History of subdural hematoma 03/2012   s/p  evacuation  . Hypertension   . Lesion of bladder    non canerous  . Prostate cancer [facility]) urologist-  dr wrenn/ oncologist- dr Sondra Come   dx 07/ 2013--  Stage T1c, Gleson 7, vol 67cc--- completed external beam radiation therapy 08-18-2012  . Pulmonary HTN ([place]) 04/12/2014   last echo 05-01-2017  PASP 73mmHg  . RBBB (right bundle branch block with left anterior fascicular block)    ocassional    Allergies No Known Allergies  IV Location/Drains/Wounds Patient Lines/Drains/Airways Status   Active Line/Drains/Airways    Name:   Placement date:   Placement time:   Site:   Days:   Peripheral IV 07/09/18  Anterior;Right Forearm   07/09/18    1305    Forearm   less than 1   Urethral Catheter Wrenn Latex;Straight-tip 20 Fr.   09/02/17    0759    Latex;Straight-tip   310   Incision (Closed) 09/02/17 Penis Other (Comment)   09/02/17    0718     310   Incision (Closed) 12/23/17 Back Other (Comment)   12/23/17    1732     198          Labs/Imaging Results for orders placed or performed during the hospital encounter of 07/09/18 (from the past 48 hour(s))  Comprehensive metabolic panel     Status: Abnormal   Collection Time: 07/09/18 11:53 AM  Result Value Ref Range   Sodium 139 135 - 145 mmol/L   Potassium 4.7 3.5 - 5.1 mmol/L   Chloride 106 98 - 111 mmol/L   CO2 22 22 - 32 mmol/L   Glucose, Bld 91  70 - 99 mg/dL   BUN 27 (H) 8 - 23 mg/dL   Creatinine, Ser 0.94 0.61 - 1.24 mg/dL   Calcium 8.3 (L) 8.9 - 10.3 mg/dL   Total Protein 6.6 6.5 - 8.1 g/dL   Albumin 4.0 3.5 - 5.0 g/dL   AST 25 15 - 41 U/L   ALT 10 0 - 44 U/L   Alkaline Phosphatase 51 38 - 126 U/L   Total Bilirubin 0.5 0.3 - 1.2 mg/dL   GFR calc non Af Amer >60 >60 mL/min   GFR calc Af Amer >60 >60 mL/min   Anion gap 11 5 - 15    Comment: Performed at [facility], [place] [address], [place], [place] [address]  CBC WITH DIFFERENTIAL     Status: Abnormal   Collection Time: 07/09/18 11:53 AM  Result Value Ref Range   WBC 4.2 4.0 - 10.5 K/uL   RBC 2.68 (L) 4.22 - 5.81 MIL/uL   Hemoglobin 5.9 (LL) 13.0 - 17.0 g/dL    Comment: This critical result has verified and been called to REED, C RN by POTEAT,SHANNON on 01 16 2020 at 1242, and has been read back. CRITICAL RESULT VERIFIED   HCT 22.1 (L) 39.0 - 52.0 %   MCV 82.5 80.0 - 100.0 fL   MCH 22.0 (L) 26.0 - 34.0 pg   MCHC 26.7 (L) 30.0 - 36.0 g/dL   RDW 16.4 (H) 11.5 - 15.5 %   Platelets 249 150 - 400 K/uL   nRBC 0.0 0.0 - 0.2 %   Neutrophils Relative % 70 %   Neutro Abs 3.0 1.7 - 7.7 K/uL   Lymphocytes Relative 13 %   Lymphs Abs 0.6 (L) 0.7 - 4.0 K/uL   Monocytes Relative 13 %   Monocytes Absolute 0.6 0.1 - 1.0 K/uL   Eosinophils Relative 2 %   Eosinophils Absolute 0.1 0.0 - 0.5 K/uL   Basophils Relative 1 %   Basophils Absolute 0.0 0.0 - 0.1 K/uL   Immature Granulocytes 1 %   Abs Immature Granulocytes 0.02 0.00 - 0.07 K/uL    Comment: Performed at [facility], [place] [address], [place],  [address]  Type and screen [place]     Status: None (Preliminary result)   Collection Time: 07/09/18 11:53 AM  Result Value Ref Range   ABO/RH(D) A NEG    Antibody Screen NEG    Sample Expiration 07/12/2018    Unit Number [id number]    Blood Component Type  RED CELLS,LR    Unit division 00    Status of Unit  ISSUED    Transfusion Status OK TO TRANSFUSE    Crossmatch Result      Compatible Performed at [facility] [address], [place], [place] [address]    Unit Number [id number]    Blood Component Type RBC LR PHER1    Unit division 00    Status of Unit ALLOCATED    Transfusion Status OK TO TRANSFUSE    Crossmatch Result Compatible   Protime-INR     Status: Abnormal   Collection Time: 07/09/18  1:21 PM  Result Value Ref Range   Prothrombin Time 25.4 (H) 11.4 - 15.2 seconds   INR 2.35     Comment: Performed at [facility], [place] [address] [place], [place] [address]  Prepare RBC     Status: None   Collection Time: 07/09/18  1:26 PM  Result Value Ref Range   Order Confirmation      ORDER PROCESSED BY BLOOD BANK Performed at [facility] [address] [place], [place] [address]   POC occult blood, ED Provider will collect     Status: Abnormal   Collection Time: 07/09/18  1:42 PM  Result Value Ref Range   Fecal Occult Bld POSITIVE (A) NEGATIVE   Dg Chest 2 View  Result Date: 07/09/2018 CLINICAL DATA:  Shortness of breath worsening over last 2 years. EXAM: CHEST - 2 VIEW COMPARISON:  None. FINDINGS: The heart size and mediastinal contours are stable. The aorta is tortuous. Heart size is enlarged. Mild scar is identified in the right lung base. There is no focal infiltrate, pulmonary edema, or pleural effusion. The visualized skeletal structures are stable. IMPRESSION: No active cardiopulmonary disease. Electronically Signed   By: Abelardo Diesel M.D.   On: 07/09/2018 11:33   EKG Interpretation  Date/Time:  Thursday July 09 2018 11:11:43 EST Ventricular Rate:  66 PR Interval:    QRS Duration: 162 QT Interval:  466 QTC Calculation: 489 R Axis:   -61 Text Interpretation:  Sinus rhythm RBBB and LAFB similar to prior 5/19 Confirmed by Aletta Edouard [phone]) on 07/09/2018 11:17:35 AM Also confirmed by Aletta Edouard  [phone]), editor Shon Hale [phone])  on 07/09/2018 12:47:19 PM   Pending Labs Unresulted Labs (From admission, onward)    Start     Ordered   Signed and Held  Basic metabolic panel  Tomorrow morning,   R     Signed and Held   Signed and Held  CBC  Tomorrow morning,   R     Signed and Held          Vitals/Pain Today's Vitals   07/09/18 1427 07/09/18 1428 07/09/18 1444 07/09/18 1554  BP: (!) 159/69 (!) 159/69 (!) 156/78 (!) 164/83  Pulse: 67 67 71 75  Resp: 15 15 15 14   Temp: 98 F (36.7 C) 98 F (36.7 C) 98.1 F (36.7 C) 98.1 F (36.7 C)  TempSrc: Oral  Oral Oral  SpO2:  98% 97% 98%  Weight:      Height:      PainSc:        Isolation Precautions No active isolations  Medications Medications  0.9 %  sodium chloride infusion (has no administration in time range)    Mobility walks with person assist

## 2018-07-09 NOTE — H&P (Signed)
H&P        [facility] History and Physical  [name] CHE:[id number] DOB: 1941-01-10 DOA: 07/09/2018  Referring physician: ED  PCP: Jeffery Pretty, MD   Chief Complaint: Dyspnea, low hemoglobin  HPI: [name] is a 78 y.o. male with past medical history of coronary artery disease, history of pulmonary volume, DVT on Xarelto, history of GI bleed in the past with diverticulosis esophageal stricture, duodenitis and adenomatous polyps in the past presented to the hospital with complaints of worsening shortness of breath for the last month or so.  Patient did have a dyspnea for couple of years but recently got worse.  Ultimately went to his primary care physician and had a blood work done which showed significant anemia with a hemoglobin of 5.9.  Patient was then considered for admission to the hospital.  Patient denied any chest pain, dizziness, lightheadedness or syncope.  He denied any nausea, vomiting, hematemesis or melena.  Patient denies any changes in his bowel habits.  Patient denies urinary urgency frequency or hematuria.  He is on Xarelto for his prior history of DVT/ PE.  Patient denies fever, runny nose sore throat or chills.  There is no sick contacts or recent travel.Patient states that he is not taking his Lasix because of the problem of frequent urination.  He however admits to increasing swelling of his bilateral lower extremity.  Patient states that he cannot lie flat because of his lower back pain.  Of dyspnea on exertion and lives by himself at home with his wife.  He states that he drinks beer every day does not get any withdrawal if he does not drink.  ED Course: In the ED, patient was noted to have a hemoglobin of 5.9.  He was started on 2 units of packed RBC and GI was consulted.  Hospitalist team was then consulted for admission the hospital.  Review of Systems:  All systems were reviewed and were negative unless otherwise mentioned in the HPI  Past Medical  History:  Diagnosis Date  . AAA (abdominal aortic aneurysm) ([place]) followed by dr [name]   last duplex 07-17-2017  3.2 x 3.3  . Anticoagulant long-term use    xarelto for hx PEs and DVT  . Arthritis    back  . Barrett's esophagus 2009  . Bilateral lower extremity edema    chronic pedal  . CAD (coronary artery disease) cardioloigst--  dr [name]   cardiac cath 04-12-2014  single vessel obstructive cad involving ostrium D2, and other nonobstructive cad , normall lvf  . Chronic lower back pain   . 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[place])    per duplex 07-17-2017  left cia 2 x 2, right cia 2.1 x 2  . Diverticulosis   . Dyspnea    with exertion   . Dyspnea on exertion   . FH: colonic polyps   . GERD (gastroesophageal reflux disease)   . Gout    08-27-2017  per pt stable  . History of adenomatous polyp of colon   . History of DVT of lower extremity    10/ 2015  LLE  . History of esophageal stricture    post dilation  . History of gastric polyp    benign  . History of pulmonary embolus (PE)    2005  treated 1 yr w/ coumadin and 10/ 2015  bilateral  . History of radiation therapy 06/22/2012-08/18/2012   78 Gy to prostate (external beam)  . History of subdural hematoma 03/2012  s/p  evacuation  . Hypertension   . Lesion of bladder    non canerous  . Prostate cancer [facility]) urologist-  dr [name]/ oncologist- dr [name]   dx 07/ 2013--  Stage T1c, [place] 7, vol 67cc--- completed external beam radiation therapy 08-18-2012  . Pulmonary HTN ([place]) 04/12/2014   last echo 05-01-2017  PASP 68mmHg  . RBBB (right bundle branch block with left anterior fascicular block)    ocassional   Past Surgical History:  Procedure Laterality Date  . CATARACT EXTRACTION W/ INTRAOCULAR LENS  IMPLANT, BILATERAL  2018  . COLONOSCOPY WITH PROPOFOL N/A 04/25/2015   Procedure: COLONOSCOPY WITH PROPOFOL;  Surgeon: Jeffery Gunning, MD;  Location: [facility] ENDOSCOPY;  Service: Gastroenterology;  Laterality: N/A;  .  CRANIOTOMY  04/03/2012   Procedure: CRANIOTOMY HEMATOMA EVACUATION SUBDURAL;  Surgeon: Erline Levine, MD;  Location: [place] NEURO ORS;  Service: Neurosurgery;  Laterality: Left;  Left Craniotomy for Evacuation of Subdural Hematoma  . CYSTOSCOPY WITH BIOPSY N/A 09/02/2017   Procedure: CYSTOSCOPY WITH BIOPSY AND FULGURATION POSSIBLE TRANSURETHRAL RESECTION OF BLADDER TUMOR;  Surgeon: Jeffery Seal, MD;  Location[facility];  Service: Urology;  Laterality: N/A;  . ESOPHAGOGASTRODUODENOSCOPY (EGD) WITH PROPOFOL N/A 04/25/2015   Procedure: ESOPHAGOGASTRODUODENOSCOPY (EGD) WITH PROPOFOL;  Surgeon: Jeffery Gunning, MD;  Location: [facility];  Service: Gastroenterology;  Laterality: N/A;  . IRRIGATION AND DEBRIDEMENT SEBACEOUS CYST  01-11-13   "off my back"  . KYPHOPLASTY N/A 12/23/2017   Procedure: Lumbar One KYPHOPLASTY;  Surgeon: Erline Levine, MD;  Location: [place];  Service: Neurosurgery;  Laterality: N/A;  Lumbar One KYPHOPLASTY  . LEFT AND RIGHT HEART CATHETERIZATION WITH CORONARY ANGIOGRAM N/A 04/12/2014   Procedure: LEFT AND RIGHT HEART CATHETERIZATION WITH CORONARY ANGIOGRAM;  Surgeon: Jeffery M Martinique, MD;  Location: [facility] CATH LAB;  Service: Cardiovascular;  Laterality: N/A;  . ORIF FEMUR FRACTURE Left 10-02-2010   dr [name]   subtrochanteric  . TONSILLECTOMY  1940's  . TRANSTHORACIC ECHOCARDIOGRAM  05/01/2017   mild concentric LVH, ef 10-17%, grade 1 diastolic dysfunction/ trivial AR/ mild dilated ascending aorta/ mild to moderate MR (no stenosis)/ mild LAE/ mild TR/ mild to moderate increase PASP 22mmHg    Social History:  reports that he quit smoking about 46 years ago. His smoking use included cigarettes. He has a 20.00 pack-year smoking history. He has never used smokeless tobacco. He reports current alcohol use of about 14.0 standard drinks of alcohol per week. He reports that he does not use drugs.  No Known Allergies  Family History  Problem Relation Age of Onset  . Macular  degeneration Mother   . Osteoarthritis Mother   . Aneurysm Father        d/o 69 yo, ruptured AAA  . Heart disease Other        No family history  . Colon cancer Neg Hx   . Colon polyps Neg Hx   . Diabetes Neg Hx      Prior to Admission medications   Medication Sig Start Date End Date Taking? Authorizing Provider  allopurinol (ZYLOPRIM) 300 MG tablet Take 300 mg by mouth every morning.    Yes [provider]  celecoxib (CELEBREX) 200 MG capsule Take 200 mg by mouth daily.  02/24/15  Yes [provider]  furosemide (LASIX) 20 MG tablet Take 1 tablet (20 mg total) by mouth daily. 07/06/18 10/04/18 Yes Lelon Perla, MD  HYDROcodone-acetaminophen (NORCO/VICODIN) 5-325 MG tablet Take 1 tablet by mouth 2 (two) times  daily. 07/08/18  Yes [provider]  XARELTO 20 MG TABS tablet TAKE 1 TABLET BY MOUTH EVERY DAY WITH SUPPER Patient taking differently: Take 20 mg by mouth daily with supper.  03/12/18  Yes Lelon Perla, MD  HYDROcodone-acetaminophen (NORCO) 7.5-325 MG tablet Take 1 tablet by mouth every 6 (six) hours as needed for moderate pain. Patient not taking: Reported on 07/09/2018 12/24/17   Erline Levine, MD  olmesartan (BENICAR) 20 MG tablet Take 0.5 tablets (10 mg total) by mouth daily. Patient not taking: Reported on 07/09/2018 07/08/18   Lendon Colonel, NP    Physical Exam: Vitals:   07/09/18 1147 07/09/18 1427 07/09/18 1428 07/09/18 1444  BP: (!) 161/76 (!) 159/69 (!) 159/69 (!) 156/78  Pulse: 84 67 67 71  Resp: 14 15 15 15   Temp:  98 F (36.7 C) 98 F (36.7 C) 98.1 F (36.7 C)  TempSrc:  Oral  Oral  SpO2: 100%  98% 97%  Weight:      Height:       Wt Readings from Last 3 Encounters:  07/09/18 104.3 kg  07/08/18 107.5 kg  12/23/17 97.5 kg   Body mass index is 31.19 kg/m.  General:  Average built, not in obvious distress HENT: Normocephalic, pupils equally reacting to light and accommodation.  No scleral pallor or icterus noted. Oral  mucosa is moist.  Chest:  Clear breath sounds.  Diminished breath sounds bilaterally. No crackles or wheezes.  CVS: S1 &S2 heard. No murmur.  Regular rate and rhythm. Abdomen: Soft, nontender, nondistended.  Bowel sounds are heard.  Liver is not palpable, no abdominal mass palpated.  Trace heme positive in the ED Extremities: No cyanosis, clubbing bilateral lower extremity pedal edema noted.  Peripheral pulses are palpable. Psych: Alert, awake and oriented, normal mood CNS:  No cranial nerve deficits.  Power equal in all extremities.   No cerebellar signs.   Skin: Warm and dry.  No rashes noted.  Labs on Admission:  Basic Metabolic Panel: Recent Labs  Lab 07/08/18 1203 07/09/18 1153  NA 143 139  K 4.5 4.7  CL 108* 106  CO2 20 22  GLUCOSE 95 91  BUN 25 27*  CREATININE 0.88 0.94  CALCIUM 8.1* 8.3*   Liver Function Tests: Recent Labs  Lab 07/09/18 1153  AST 25  ALT 10  ALKPHOS 51  BILITOT 0.5  PROT 6.6  ALBUMIN 4.0   No results for input(s): LIPASE, AMYLASE in the last 168 hours. No results for input(s): AMMONIA in the last 168 hours. CBC: Recent Labs  Lab 07/08/18 1203 07/09/18 1153  WBC 2.7* 4.2  NEUTROABS  --  3.0  HGB 5.6* 5.9*  HCT 20.2* 22.1*  MCV 78* 82.5  PLT 288 249   Cardiac Enzymes: No results for input(s): CKTOTAL, CKMB, CKMBINDEX, TROPONINI in the last 168 hours.  BNP (last 3 results) Recent Labs    07/08/18 1203  BNP 100.0    ProBNP (last 3 results) No results for input(s): PROBNP in the last 8760 hours.  CBG: No results for input(s): GLUCAP in the last 168 hours.   Radiological Exams on Admission: Dg Chest 2 View  Result Date: 07/09/2018 CLINICAL DATA:  Shortness of breath worsening over last 2 years. EXAM: CHEST - 2 VIEW COMPARISON:  None. FINDINGS: The heart size and mediastinal contours are stable. The aorta is tortuous. Heart size is enlarged. Mild scar is identified in the right lung base. There is no focal infiltrate, pulmonary  edema,  or pleural effusion. The visualized skeletal structures are stable. IMPRESSION: No active cardiopulmonary disease. Electronically Signed   By: Abelardo Diesel M.D.   On: 07/09/2018 11:33    EKG: Personally reviewed by me which shows normal sinus rhythm, right bundle branch block  Assessment/Plan  Principal Problem:   GI bleed Active Problems:   GERD   COLONIC POLYPS, ADENOMATOUS, HX OF   Dyspnea   Hypertension   History of pulmonary embolus (PE)   Anticoagulant long-term use   History of DVT of lower extremity  GI bleed.  Patient does have history of duodenitis, Barrett's esophagus history of upper GI bleed, diverticulosis, history of colon cancer in the past, GERD and is currently on Xarelto.  Patient was Hemoccult positive but denies gross bleeding.  Patient had significant anemia and tube present with so will benefit from GI evaluation as inpatient.  GI has been consulted from the ED.   IV fluids Protonix and transfuse 2 units of packed RBC which has been initiated in the hospital.  Hold Xarelto for now.  Hold NSAIDs.  Patient admits to drinking beer every day.  He was counseled about it.  Symptomatic anemia from GI bleed with trace heme positive stools.  Transfuse 2 units of packed RBC  Progressive shortness of breath could be exacerbated by anemia.  Patient does have history of coronary artery disease and has had dyspnea for prolonged duration of time.  He follows up with cardiology as outpatient.  He supposed to take Lasix every day at home but is not using it.  Will consider daily Lasix will change to IV at this time.  History of DVT, pulmonary embolism.  On Xarelto as outpatient.  Will hold until further evaluation/GI work-up.  Patient will need closer monitoring of his hemoglobin if he were to be continued on anticoagulation on discharge   Consultant: [facility] has been consulted  Code Status: Full code  DVT Prophylaxis: SCD  Antibiotics: None  Family Communication:   Patients' condition and plan of care including tests being ordered have been discussed with the patient and wife at bedside who indicate understanding and agree with the plan.  Disposition Plan: Home  Severity of Illness: The appropriate patient status for this patient is INPATIENT. Inpatient status is judged to be reasonable and necessary in order to provide the required intensity of service to ensure the patient's safety. The patient's presenting symptoms, physical exam findings, and initial radiographic and laboratory data in the context of their chronic comorbidities is felt to place them at high risk for further clinical deterioration. Furthermore, it is not anticipated that the patient will be medically stable for discharge from the hospital within 2 midnights of admission. I certify that at the point of admission it is my clinical judgment that the patient will require inpatient hospital care spanning beyond 2 midnights from the point of admission due to high intensity of service, high risk for further deterioration and high frequency of surveillance required.   Signed, Flora Lipps, MD [facility] 07/09/2018

## 2018-07-09 NOTE — Consult Note (Addendum)
Referring Provider:  Dr. Melina Copa, [place] Primary Care Physician:  Deland Pretty, MD Primary Gastroenterologist:  Dr. Havery Moros  Reason for Consultation:  Anemia, heme positive stools  HPI: [name] is a 78 y.o. male with past medical history of coronary artery disease, history of pulmonary embolism and DVT on Xarelto amongst other history as listed below.  He reports that he has been short of breath for a couple of years but at least worsening over the last 6 weeks or so.  He ultimately went to his cardiologist who drew lab work and then called him today telling him he needed to come to the emergency department because his Hgb was 5.6 grams.  He was "trace" heme positive per the EDP.  He is getting his first unit of PRBC's.  Of note, he was hospitalized in May of 2019 and was anemic at that time with Hgb in the 7 gram range.  Iron studies were low, but was not placed on iron supplements, etc. then appears that hemoglobin bump back up to about 13 g from a reading in July 2019.  He had undergone EGD and colonoscopy in November 2016 per Dr. Havery Moros. He had Barrett's without dysplasia and possible GAVE. At colonoscopy had severe left colon diverticulosis and several colon polyps. He had a large 4 cm spreading polyp in the ascending colon which was tattooed but not removed. And also had APC done to an area of radiation proctitis.  He was subsequently referred to Dr. Arsenio Loader at [facility] for endoscopic mucosal resection of this lesion in the colon. This was done in March 2017-pathology showed fragments of tubular adenoma.  Last dose of Xarelto was this AM, 1/16.  INR also elevated at 2.35.    He denies any evidence of rectal bleeding.  Reports no black or tarry stools.  Denies abdominal pain, no nausea or vomiting.  Says that his appetite is good.  He reports sounds like a approximately 40 pound weight loss over the past year, but thinks it has stabilized recently.  Moves his bowels  regularly about every other day without any changes.  In regards to his GERD and his Barrett's esophagus it sounds like he was just on ranitidine and then since that recall a few months ago it was discontinued but he has not yet restarted any other type of acid suppression.  Does use Celebrex daily for back pain, but denies any other NSAID use.  Past Medical History:  Diagnosis Date  . AAA (abdominal aortic aneurysm) ([place]) followed by dr Stanford Breed   last duplex 07-17-2017  3.2 x 3.3  . Anticoagulant long-term use    xarelto for hx PEs and DVT  . Arthritis    back  . Barrett's esophagus 2009  . Bilateral lower extremity edema    chronic pedal  . CAD (coronary artery disease) cardioloigst--  dr Stanford Breed   cardiac cath 04-12-2014  single vessel obstructive cad involving ostrium D2, and other nonobstructive cad , normall lvf  . Chronic lower back pain   . 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[place])    per duplex 07-17-2017  left cia 2 x 2, right cia 2.1 x 2  . Diverticulosis   . Dyspnea    with exertion   . Dyspnea on exertion   . FH: colonic polyps   . GERD (gastroesophageal reflux disease)   . Gout    08-27-2017  per pt stable  . History of adenomatous polyp of colon   . History of DVT of  lower extremity    10/ 2015  LLE  . History of esophageal stricture    post dilation  . History of gastric polyp    benign  . History of pulmonary embolus (PE)    2005  treated 1 yr w/ coumadin and 10/ 2015  bilateral  . History of radiation therapy 06/22/2012-08/18/2012   78 Gy to prostate (external beam)  . History of subdural hematoma 03/2012   s/p  evacuation  . Hypertension   . Lesion of bladder    non canerous  . Prostate cancer [facility]) urologist-  dr wrenn/ oncologist- dr Sondra Come   dx 07/ 2013--  Stage T1c, Gles[place] 7, vol 67cc--- completed external beam radiation therapy 08-18-2012  . Pulmonary HTN ([place]) 04/12/2014   last echo 05-01-2017  PASP 33mmHg  . RBBB (right bundle branch block with left  anterior fascicular block)    ocassional    Past Surgical History:  Procedure Laterality Date  . CATARACT EXTRACTION W/ INTRAOCULAR LENS  IMPLANT, BILATERAL  2018  . COLONOSCOPY WITH PROPOFOL N/A 04/25/2015   Procedure: COLONOSCOPY WITH PROPOFOL;  Surgeon: Manus Gunning, MD;  Location: [facility];  Service: Gastroenterology;  Laterality: N/A;  . CRANIOTOMY  04/03/2012   Procedure: CRANIOTOMY HEMATOMA EVACUATION SUBDURAL;  Surgeon: Erline Levine, MD;  Location: [facility];  Service: Neurosurgery;  Laterality: Left;  Left Craniotomy for Evacuation of Subdural Hematoma  . CYSTOSCOPY WITH BIOPSY N/A 09/02/2017   Procedure: CYSTOSCOPY WITH BIOPSY AND FULGURATION POSSIBLE TRANSURETHRAL RESECTION OF BLADDER TUMOR;  Surgeon: [name], MD;  Location: [facility];  Service: Urology;  Laterality: N/A;  . ESOPHAGOGASTRODUODENOSCOPY (EGD) WITH PROPOFOL N/A 04/25/2015   Procedure: ESOPHAGOGASTRODUODENOSCOPY (EGD) WITH PROPOFOL;  Surgeon: Manus Gunning, MD;  Location: [facility];  Service: Gastroenterology;  Laterality: N/A;  . IRRIGATION AND DEBRIDEMENT SEBACEOUS CYST  01-11-13   "off my back"  . KYPHOPLASTY N/A 12/23/2017   Procedure: Lumbar One KYPHOPLASTY;  Surgeon: Erline Levine, MD;  Location: [facility];  Service: Neurosurgery;  Laterality: N/A;  Lumbar One KYPHOPLASTY  . LEFT AND RIGHT HEART CATHETERIZATION WITH CORONARY ANGIOGRAM N/A 04/12/2014   Procedure: LEFT AND RIGHT HEART CATHETERIZATION WITH CORONARY ANGIOGRAM;  Surgeon: Peter M Martinique, MD;  Location: [facility];  Service: Cardiovascular;  Laterality: N/A;  . ORIF FEMUR FRACTURE Left 10-02-2010   dr Alvan Dame   subtrochanteric  . TONSILLECTOMY  1940's  . TRANSTHORACIC ECHOCARDIOGRAM  05/01/2017   mild concentric LVH, ef 70-35%, grade 1 diastolic dysfunction/ trivial AR/ mild dilated ascending aorta/ mild to moderate MR (no stenosis)/ mild LAE/ mild TR/ mild to moderate increase PASP 67mmHg    Prior to Admission  medications   Medication Sig Start Date End Date Taking? Authorizing Provider  allopurinol (ZYLOPRIM) 300 MG tablet Take 300 mg by mouth every morning.    Yes [provider]  celecoxib (CELEBREX) 200 MG capsule Take 200 mg by mouth daily.  02/24/15  Yes [provider]  furosemide (LASIX) 20 MG tablet Take 1 tablet (20 mg total) by mouth daily. 07/06/18 10/04/18 Yes Lelon Perla, MD  HYDROcodone-acetaminophen (NORCO/VICODIN) 5-325 MG tablet Take 1 tablet by mouth 2 (two) times daily. 07/08/18  Yes [provider]  XARELTO 20 MG TABS tablet TAKE 1 TABLET BY MOUTH EVERY DAY WITH SUPPER Patient taking differently: Take 20 mg by mouth daily with supper.  03/12/18  Yes Lelon Perla, MD  HYDROcodone-acetaminophen (NORCO) 7.5-325 MG tablet Take 1 tablet by mouth every 6 (  six) hours as needed for moderate pain. Patient not taking: Reported on 07/09/2018 12/24/17   Erline Levine, MD  olmesartan (BENICAR) 20 MG tablet Take 0.5 tablets (10 mg total) by mouth daily. Patient not taking: Reported on 07/09/2018 07/08/18   Lendon Colonel, NP    Current Facility-Administered Medications  Medication Dose Route Frequency Provider Last Rate Last Dose  . 0.9 %  sodium chloride infusion  10 mL/hr Intravenous Once Hayden Rasmussen, MD       Current Outpatient Medications  Medication Sig Dispense Refill  . allopurinol (ZYLOPRIM) 300 MG tablet Take 300 mg by mouth every morning.     . celecoxib (CELEBREX) 200 MG capsule Take 200 mg by mouth daily.     . furosemide (LASIX) 20 MG tablet Take 1 tablet (20 mg total) by mouth daily. 90 tablet 3  . HYDROcodone-acetaminophen (NORCO/VICODIN) 5-325 MG tablet Take 1 tablet by mouth 2 (two) times daily.    Alveda Reasons 20 MG TABS tablet TAKE 1 TABLET BY MOUTH EVERY DAY WITH SUPPER (Patient taking differently: Take 20 mg by mouth daily with supper. ) 90 tablet 1  . HYDROcodone-acetaminophen (NORCO) 7.5-325 MG tablet Take 1 tablet by mouth every 6  (six) hours as needed for moderate pain. (Patient not taking: Reported on 07/09/2018) 30 tablet 0  . olmesartan (BENICAR) 20 MG tablet Take 0.5 tablets (10 mg total) by mouth daily. (Patient not taking: Reported on 07/09/2018) 15 tablet 3    Allergies as of 07/09/2018  . (No Known Allergies)    Family History  Problem Relation Age of Onset  . Macular degeneration Mother   . Osteoarthritis Mother   . Aneurysm Father        d/o 68 yo, ruptured AAA  . Heart disease Other        No family history  . Colon cancer Neg Hx   . Colon polyps Neg Hx   . Diabetes Neg Hx     Social History   Socioeconomic History  . Marital status: Married    Spouse name: Not on file  . Number of children: 3  . Years of education: Not on file  . Highest education level: Not on file  Occupational History  . 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 Financial resource strain: Not on file  . Food insecurity:    Worry: Not on file    Inability: Not on file  . Transportation needs:    Medical: Not on file    Non-medical: Not on file  Tobacco Use  . Smoking status: Former Smoker    Packs/day: 1.00    Years: 20.00    Pack years: 20.00    Types: Cigarettes    Last attempt to quit: 12/23/1971    Years since quitting: 46.5  . Smokeless tobacco: Never Used  Substance and Sexual Activity  . Alcohol use: Yes    Alcohol/week: 14.0 standard drinks    Types: 14 Cans of beer per week    Comment: 2 beer daily  . Drug use: No  . Sexual activity: Yes  Lifestyle  . Physical activity:    Days per week: Not on file    Minutes per session: Not on file  . Stress: Not on file  Relationships  . Social connections:    Talks on phone: Not on file    Gets together: Not on file    Attends religious service: Not  on file    Active member of club or organization: Not on file    Attends meetings of clubs or organizations: Not on file    Relationship status: Not on file   . Intimate partner violence:    Fear of current or ex partner: Not on file    Emotionally abused: Not on file    Physically abused: Not on file    Forced sexual activity: Not on file  Other Topics Concern  . Not on file  Social History Narrative   Originally from [place].  Retired to [employer] because he was tired of all the snow.    Review of Systems: ROS is O/W negative except as mentioned in HPI.  Physical Exam: Vital signs in last 24 hours: Temp:  [98 F (36.7 C)-98.3 F (36.8 C)] 98.1 F (36.7 C) (01/16 1444) Pulse Rate:  [63-84] 71 (01/16 1444) Resp:  [12-18] 15 (01/16 1444) BP: (144-161)/(69-78) 156/78 (01/16 1444) SpO2:  [96 %-100 %] 97 % (01/16 1444) Weight:  [104.3 kg] 104.3 kg (01/16 1001)   General:  Alert, Well-developed, well-nourished, pleasant and cooperative in NAD Head:  Normocephalic and atraumatic. Eyes:  Sclera clear, no icterus.  Conjunctiva pink. Ears:  Normal auditory acuity. Mouth:  No deformity or lesions.   Lungs:  Clear throughout to auscultation.  No wheezes, crackles, or rhonchi.  Heart:  Regular rate and rhythm; no murmurs, clicks, rubs, or gallops. Abdomen:  Soft, non-distended.  BS present.  Non-tender.  Rectal:  Deferred.  Was "trace" heme positive per the ED MD.  Msk:  Symmetrical without gross deformities. Pulses:  Normal pulses noted. Extremities:  Moderate B/L LE edema noted, slightly more on the left. Neurologic:  Alert and oriented x 4;  grossly normal neurologically. Skin:  Intact without significant lesions or rashes. Psych:  Alert and cooperative. Normal mood and affect.  Lab Results: Recent Labs    07/08/18 1203 07/09/18 1153  WBC 2.7* 4.2  HGB 5.6* 5.9*  HCT 20.2* 22.1*  PLT 288 249   BMET Recent Labs    07/08/18 1203 07/09/18 1153  NA 143 139  K 4.5 4.7  CL 108* 106  CO2 20 22  GLUCOSE 95 91  BUN 25 27*  CREATININE 0.88 0.94  CALCIUM 8.1* 8.3*   LFT Recent Labs    07/09/18 1153  PROT 6.6    ALBUMIN 4.0  AST 25  ALT 10  ALKPHOS 51  BILITOT 0.5   PT/INR Recent Labs    07/09/18 1321  LABPROT 25.4*  INR 2.35   Studies/Results: Dg Chest 2 View  Result Date: 07/09/2018 CLINICAL DATA:  Shortness of breath worsening over last 2 years. EXAM: CHEST - 2 VIEW COMPARISON:  None. FINDINGS: The heart size and mediastinal contours are stable. The aorta is tortuous. Heart size is enlarged. Mild scar is identified in the right lung base. There is no focal infiltrate, pulmonary edema, or pleural effusion. The visualized skeletal structures are stable. IMPRESSION: No active cardiopulmonary disease. Electronically Signed   By: Abelardo Diesel M.D.   On: 07/09/2018 11:33   IMPRESSION:  #1  Symptomatic anemia:  Hgb 5.6/5.9 grams with "trace" heme positive stools.  No overt bleeding noted.  Receiving first unit PRBC's now.  Had anemia during hospitalization back in May with hemoglobin in the 7 g range, but then hemoglobin seem to trend back up and was at 13 g in July 2019. #2  History of multiple adenomatous colon polyps and 4 cm ascending  colon mass removed by EMR per Dr. Arsenio Loader 08/2015-was supposed to have follow-up colonoscopy in one year. #3  coronary artery disease #4  history of DVT/PE on chronic Xarelto #5 Coagulopathy:  INR 2.35 on this admission, guessing related to the Xarelto. #6 Barretts esophagus no dysplasia- last EGD 11/ 2016.  Was due for recall 04/2018.  Not on any acid reducing medication recently and only on ranitidine for a while prior to the recall on that.  No recent PPI use. #7  NSAID use with celebrex daily  PLAN: -Will plan for EGD and colonoscopy as inpatient once Xarelto washes out for 2 days and INR normalizes. -Patient would like for his cardiologist, Dr. Stanford Breed, to be aware about holding Xarelto, etc if the hospitalist would kindly reach out to him. -Will give a heart healthy diet for now as we do not anticipate procedures any earlier than Saturday.  [name].  Zehr  07/09/2018, 2:45 PM    ________________________________________________________________________  Velora Heckler GI MD note:  I personally examined the patient, reviewed the data and agree with the assessment and plan described above.  His Hb has bumped from 5.6 to 7 after 2 units.  He's had zero overt bleeding and no specific GI symptoms.  We discussed colonoscopy +/- EGD tomorrow which would be 48 hours after his last xarelto.  He understands and agrees.    I will order one more unit of prbc, prep, diet clears for now.   Owens Loffler, MD [facility] Gastroenterology Pager [phone]

## 2018-07-10 LAB — BASIC METABOLIC PANEL
Anion gap: 9 (ref 5–15)
BUN: 17 mg/dL (ref 8–23)
CO2: 23 mmol/L (ref 22–32)
CREATININE: 0.83 mg/dL (ref 0.61–1.24)
Calcium: 7.7 mg/dL — ABNORMAL LOW (ref 8.9–10.3)
Chloride: 105 mmol/L (ref 98–111)
GFR calc Af Amer: >60 mL/min (ref >60)
GFR calc non Af Amer: >60 mL/min (ref >60)
Glucose, Bld: 91 mg/dL (ref 70–99)
Potassium: 3.9 mmol/L (ref 3.5–5.1)
Sodium: 137 mmol/L (ref 135–145)

## 2018-07-10 LAB — CBC
HCT: 23.9 % — ABNORMAL LOW (ref 39.0–52.0)
Hemoglobin: 7 g/dL — ABNORMAL LOW (ref 13.0–17.0)
MCH: 23.6 pg — ABNORMAL LOW (ref 26.0–34.0)
MCHC: 29.3 g/dL — AB (ref 30.0–36.0)
MCV: 80.5 fL (ref 80.0–100.0)
Platelets: 225 10*3/uL (ref 150–400)
RBC: 2.97 MIL/uL — ABNORMAL LOW (ref 4.22–5.81)
RDW: 16.7 % — ABNORMAL HIGH (ref 11.5–15.5)
WBC: 3.8 10*3/uL — ABNORMAL LOW (ref 4.0–10.5)
nRBC: 0 % (ref 0.0–0.2)

## 2018-07-10 LAB — HEMOGLOBIN AND HEMATOCRIT, BLOOD
HCT: 24.1 % — ABNORMAL LOW (ref 39.0–52.0)
HCT: 29.5 % — ABNORMAL LOW (ref 39.0–52.0)
Hemoglobin: 7.1 g/dL — ABNORMAL LOW (ref 13.0–17.0)
Hemoglobin: 8.8 g/dL — ABNORMAL LOW (ref 13.0–17.0)

## 2018-07-10 LAB — PROTIME-INR
INR: 1.19
Prothrombin Time: 15 seconds (ref 11.4–15.2)

## 2018-07-10 LAB — PREPARE RBC (CROSSMATCH)

## 2018-07-10 MED ORDER — PEG-KCL-NACL-NASULF-NA ASC-C 100 G PO SOLR
0.5000 | Freq: Once | ORAL | Status: AC
  Administered 2018-07-10: 100 g via ORAL
  Filled 2018-07-10: qty 1

## 2018-07-10 MED ORDER — PEG-KCL-NACL-NASULF-NA ASC-C 100 G PO SOLR
0.5000 | Freq: Once | ORAL | Status: AC
  Administered 2018-07-11: 100 g via ORAL
  Filled 2018-07-10: qty 1

## 2018-07-10 MED ORDER — PEG-KCL-NACL-NASULF-NA ASC-C 100 G PO SOLR: 1.0000 | Freq: Two times a day (BID) | ORAL | Status: DC

## 2018-07-10 MED ORDER — ALBUMIN HUMAN 25 % IV SOLN: 12.5000 g | Freq: Once | INTRAVENOUS | Status: DC

## 2018-07-10 MED ORDER — FUROSEMIDE 10 MG/ML IJ SOLN
40.0000 mg | Freq: Every day | INTRAMUSCULAR | Status: DC
  Administered 2018-07-11: 40 mg via INTRAVENOUS
  Filled 2018-07-10: qty 4

## 2018-07-10 MED ORDER — SODIUM CHLORIDE 0.9% IV SOLUTION: Freq: Once | INTRAVENOUS | Status: DC

## 2018-07-10 NOTE — Progress Notes (Signed)
PROGRESS NOTE    Berry Jeffery  MRN:[id number] DOB: 04/23/1941 DOA: 07/09/2018 PCP: Pharr, Walter, MD   Brief Narrative:  [name] is a 78 y.o. male with past medical history of coronary artery disease, history of PE, DVT on Xarelto, history of GI bleed in the past with diverticulosis esophageal stricture, duodenitis and adenomatous polyps in the past presented to the hospital with complaints of worsening shortness of breath for the last month or so.  Patient did have a dyspnea for couple of years but recently got worse.  Ultimately went to his primary care physician and had a blood work done which showed significant anemia with a hemoglobin of 5.9.  Patient was then considered for admission to the hospital.  Patient denied any chest pain, dizziness, lightheadedness or syncope.  He denied any nausea, vomiting, hematemesis or melena.  Patient denies any changes in his bowel habits.  Patient denies urinary urgency frequency or hematuria.  He is on Xarelto for his prior history of DVT/ PE.  Patient denies fever, runny nose sore throat or chills.  There is no sick contacts or recent travel.Patient states that he is not taking his Lasix because of the problem of frequent urination.  He however admits to increasing swelling of his bilateral lower extremity.  Patient states that he cannot lie flat because of his lower back pain.  Of dyspnea on exertion and lives by himself at home with his wife.  He states that he drinks beer every day does not get any withdrawal if he does not drink.  Assessment & Plan:   Principal Problem:   GI bleed Active Problems:   GERD   COLONIC POLYPS, ADENOMATOUS, HX OF   Dyspnea   Hypertension   History of pulmonary embolus (PE)   Anticoagulant long-term use   History of DVT of lower extremity   GI bleed.  Patient with hx diverticulosis, hx barrets esophagus.  Reported hx GI bleed.   No gross bleeding on exam, but positive hemoccult and anemic Holding xarelto BID  PPI Appreciate GI recs - planning for colonoscopy 1/18 Daily drinker, counseled by admitting physician  Symptomatic anemia   2/2 above S/p 3 units pRBC Follow serial H/H  Shortness of Breath: likely 2/2 above.  Improved with blood.  He's got LE edema, but CXR without edema.  Will continue IV lasix daily here, but suspect this was due to his anemia.    History of DVT, pulmonary embolism.  On Xarelto as outpatient.  Will hold until further evaluation/GI work-up.  Patient will need closer monitoring of his hemoglobin if he were to be continued on anticoagulation on discharge.  Send Epic message to Dr. Crenshaw to notify him of admission per pt request.  DVT prophylaxis: SCD Code Status: full  Family Communication: wife at bedside Disposition Plan: pending GI workup, colonscopy   Consultants:   Gastroenterology  Procedures:   none  Antimicrobials:   none   Subjective: Feels better after transfusion.  Wasn't taking lasix outpatient.  Objective: Vitals:   07/10/18 0935 07/10/18 1015 07/10/18 1042 07/10/18 1355  BP: (!) 151/73 (!) 145/77 (!) 154/71 (!) 160/76  Pulse: 71 70 71 71  Resp: 18 18 18 18  Temp:  98.2 F (36.8 C) (!) 97.5 F (36.4 C) 98 F (36.7 C)  TempSrc:  Oral Axillary Oral  SpO2: 96% 97% 96% 97%  Weight:      Height:        Intake/Output Summary (Last 24 hours) at 07/10/2018 1544   Last data filed at 07/10/2018 1330 Gross per 24 hour  Intake 1282.09 ml  Output 300 ml  Net 982.09 ml   Filed Weights   07/09/18 1001  Weight: 104.3 kg    Examination:  General exam: Appears calm and comfortable  Respiratory system: Clear to auscultation. Respiratory effort normal. Cardiovascular system: S1 & S2 heard, RRR.  Gastrointestinal system: Abdomen is nondistended, soft and nontender.  Central nervous system: Alert and oriented. No focal neurological deficits. Extremities: 1+ LEE Skin: No rashes, lesions or ulcers Psychiatry: Judgement and insight appear  normal. Mood & affect appropriate.     Data Reviewed: I have personally reviewed following labs and imaging studies  CBC: Recent Labs  Lab 07/08/18 1203 07/09/18 1153 07/10/18 0542 07/10/18 0754  WBC 2.7* 4.2 3.8*  --   NEUTROABS  --  3.0  --   --   HGB 5.6* 5.9* 7.0* 7.1*  HCT 20.2* 22.1* 23.9* 24.1*  MCV 78* 82.5 80.5  --   PLT 288 249 225  --    Basic Metabolic Panel: Recent Labs  Lab 07/08/18 1203 07/09/18 1153 07/10/18 0542  NA 143 139 137  K 4.5 4.7 3.9  CL 108* 106 105  CO2 20 22 23  GLUCOSE 95 91 91  BUN 25 27* 17  CREATININE 0.88 0.94 0.83  CALCIUM 8.1* 8.3* 7.7*   GFR: Estimated Creatinine Clearance: 91.6 mL/min (by C-G formula based on SCr of 0.83 mg/dL). Liver Function Tests: Recent Labs  Lab 07/09/18 1153  AST 25  ALT 10  ALKPHOS 51  BILITOT 0.5  PROT 6.6  ALBUMIN 4.0   No results for input(s): LIPASE, AMYLASE in the last 168 hours. No results for input(s): AMMONIA in the last 168 hours. Coagulation Profile: Recent Labs  Lab 07/09/18 1321 07/10/18 0934  INR 2.35 1.19   Cardiac Enzymes: No results for input(s): CKTOTAL, CKMB, CKMBINDEX, TROPONINI in the last 168 hours. BNP (last 3 results) No results for input(s): PROBNP in the last 8760 hours. HbA1C: No results for input(s): HGBA1C in the last 72 hours. CBG: No results for input(s): GLUCAP in the last 168 hours. Lipid Profile: No results for input(s): CHOL, HDL, LDLCALC, TRIG, CHOLHDL, LDLDIRECT in the last 72 hours. Thyroid Function Tests: No results for input(s): TSH, T4TOTAL, FREET4, T3FREE, THYROIDAB in the last 72 hours. Anemia Panel: No results for input(s): VITAMINB12, FOLATE, FERRITIN, TIBC, IRON, RETICCTPCT in the last 72 hours. Sepsis Labs: No results for input(s): PROCALCITON, LATICACIDVEN in the last 168 hours.  No results found for this or any previous visit (from the past 240 hour(s)).       Radiology Studies: Dg Chest 2 View  Result Date:  07/09/2018 CLINICAL DATA:  Shortness of breath worsening over last 2 years. EXAM: CHEST - 2 VIEW COMPARISON:  None. FINDINGS: The heart size and mediastinal contours are stable. The aorta is tortuous. Heart size is enlarged. Mild scar is identified in the right lung base. There is no focal infiltrate, pulmonary edema, or pleural effusion. The visualized skeletal structures are stable. IMPRESSION: No active cardiopulmonary disease. Electronically Signed   By: Wei-Chen  Lin M.D.   On: 07/09/2018 11:33        Scheduled Meds: . sodium chloride   Intravenous Once  . allopurinol  300 mg Oral q morning - 10a  . furosemide  40 mg Intravenous Daily  . pantoprazole (PROTONIX) IV  40 mg Intravenous Q12H  . peg 3350 powder  0.5 kit Oral Once     And  . [START ON 07/11/2018] peg 3350 powder  0.5 kit Oral Once  . sodium chloride flush  3 mL Intravenous Q12H   Continuous Infusions: . sodium chloride       LOS: 1 day    Time spent: over 30 min    Caldwell Powell, MD [facility] Pager AMION  If 7PM-7AM, please contact night-coverage [email or web address] Password [email or web address] 07/10/2018, 3:44 PM   

## 2018-07-11 ENCOUNTER — Encounter (HOSPITAL_COMMUNITY)
Admission: EM | Disposition: A | Discharge status: Home / Self Care | Payer: Self-pay | Source: Home / Self Care | Attending: Family Medicine

## 2018-07-11 ENCOUNTER — Inpatient Hospital Stay (HOSPITAL_COMMUNITY): Payer: PPO | Admitting: Certified Registered Nurse Anesthetist

## 2018-07-11 ENCOUNTER — Encounter (HOSPITAL_COMMUNITY): Payer: Self-pay | Admitting: Certified Registered Nurse Anesthetist

## 2018-07-11 DIAGNOSIS — R195 Other fecal abnormalities: Secondary | ICD-10-CM

## 2018-07-11 DIAGNOSIS — K2270 Barrett's esophagus without dysplasia: Secondary | ICD-10-CM

## 2018-07-11 DIAGNOSIS — D122 Benign neoplasm of ascending colon: Secondary | ICD-10-CM

## 2018-07-11 DIAGNOSIS — K635 Polyp of colon: Secondary | ICD-10-CM

## 2018-07-11 HISTORY — PX: ESOPHAGOGASTRODUODENOSCOPY (EGD) WITH PROPOFOL: SHX5813

## 2018-07-11 HISTORY — PX: POLYPECTOMY: SHX5525

## 2018-07-11 HISTORY — PX: BIOPSY: SHX5522

## 2018-07-11 HISTORY — PX: COLONOSCOPY WITH PROPOFOL: SHX5780

## 2018-07-11 LAB — CBC
HCT: 30.9 % — ABNORMAL LOW (ref 39.0–52.0)
Hemoglobin: 9.1 g/dL — ABNORMAL LOW (ref 13.0–17.0)
MCH: 24.1 pg — ABNORMAL LOW (ref 26.0–34.0)
MCHC: 29.4 g/dL — ABNORMAL LOW (ref 30.0–36.0)
MCV: 82 fL (ref 80.0–100.0)
Platelets: 249 10*3/uL (ref 150–400)
RBC: 3.77 MIL/uL — ABNORMAL LOW (ref 4.22–5.81)
RDW: 17.2 % — ABNORMAL HIGH (ref 11.5–15.5)
WBC: 4.5 10*3/uL (ref 4.0–10.5)
nRBC: 0 % (ref 0.0–0.2)

## 2018-07-11 LAB — COMPREHENSIVE METABOLIC PANEL
ALBUMIN: 3.8 g/dL (ref 3.5–5.0)
ALT: 10 U/L (ref 0–44)
AST: 13 U/L — ABNORMAL LOW (ref 15–41)
Alkaline Phosphatase: 53 U/L (ref 38–126)
Anion gap: 13 (ref 5–15)
BILIRUBIN TOTAL: 1.1 mg/dL (ref 0.3–1.2)
BUN: 13 mg/dL (ref 8–23)
CALCIUM: 8.2 mg/dL — AB (ref 8.9–10.3)
CO2: 23 mmol/L (ref 22–32)
Chloride: 106 mmol/L (ref 98–111)
Creatinine, Ser: 0.97 mg/dL (ref 0.61–1.24)
GFR calc Af Amer: >60 mL/min (ref >60)
GFR calc non Af Amer: >60 mL/min (ref >60)
Glucose, Bld: 111 mg/dL — ABNORMAL HIGH (ref 70–99)
Potassium: 3.4 mmol/L — ABNORMAL LOW (ref 3.5–5.1)
Sodium: 142 mmol/L (ref 135–145)
Total Protein: 6.4 g/dL — ABNORMAL LOW (ref 6.5–8.1)

## 2018-07-11 LAB — BPAM RBC
Blood Product Expiration Date: 202002132359
Blood Product Expiration Date: 202002142359
Blood Product Expiration Date: 202002152359
ISSUE DATE / TIME: 202001161800
ISSUE DATE / TIME: 202001170959
ISSUE DATE / TIME: 202001161415
Unit Type and Rh: 600
Unit Type and Rh: 600
Unit Type and Rh: 600

## 2018-07-11 LAB — TYPE AND SCREEN
ABO/RH(D): A NEG
Antibody Screen: NEGATIVE
Unit division: 0
Unit division: 0
Unit division: 0

## 2018-07-11 LAB — MAGNESIUM: Magnesium: 2.2 mg/dL (ref 1.7–2.4)

## 2018-07-11 SURGERY — COLONOSCOPY WITH PROPOFOL
Anesthesia: Monitor Anesthesia Care

## 2018-07-11 MED ORDER — PROPOFOL 10 MG/ML IV BOLUS
INTRAVENOUS | Status: AC
  Filled 2018-07-11: qty 40

## 2018-07-11 MED ORDER — PHENYLEPHRINE HCL 10 MG/ML IJ SOLN
INTRAMUSCULAR | Status: DC | PRN
  Administered 2018-07-11: 120 ug via INTRAVENOUS
  Administered 2018-07-11 (×2): 80 ug via INTRAVENOUS
  Administered 2018-07-11: 120 ug via INTRAVENOUS

## 2018-07-11 MED ORDER — LACTATED RINGERS IV SOLN
INTRAVENOUS | Status: DC
  Administered 2018-07-11: 08:00:00 via INTRAVENOUS
  Administered 2018-07-11: 1000 mL via INTRAVENOUS

## 2018-07-11 MED ORDER — FERROUS SULFATE 325 (65 FE) MG PO TABS: 325.0000 mg | ORAL_TABLET | Freq: Every day | ORAL | 0 refills | Status: DC

## 2018-07-11 MED ORDER — SODIUM CHLORIDE 0.9 % IV SOLN: INTRAVENOUS | Status: DC

## 2018-07-11 MED ORDER — PROPOFOL 500 MG/50ML IV EMUL
INTRAVENOUS | Status: DC | PRN
  Administered 2018-07-11: 125 ug/kg/min via INTRAVENOUS

## 2018-07-11 MED ORDER — FUROSEMIDE 20 MG PO TABS: 20.0000 mg | ORAL_TABLET | Freq: Every day | ORAL | 0 refills | Status: DC

## 2018-07-11 MED ORDER — PROPOFOL 500 MG/50ML IV EMUL
INTRAVENOUS | Status: DC | PRN
  Administered 2018-07-11: 30 mg via INTRAVENOUS

## 2018-07-11 MED ORDER — POTASSIUM CHLORIDE 10 MEQ/100ML IV SOLN: 10.0000 meq | INTRAVENOUS | Status: AC

## 2018-07-11 SURGICAL SUPPLY — 25 items

## 2018-07-11 NOTE — Anesthesia Postprocedure Evaluation (Signed)
Anesthesia Post Note  Patient: [name]  Procedure(s) Performed: COLONOSCOPY WITH PROPOFOL (N/A ) ESOPHAGOGASTRODUODENOSCOPY (EGD) WITH PROPOFOL (N/A ) POLYPECTOMY BIOPSY SUBMUCOSAL LIFTING INJECTION     Patient location during evaluation: Endoscopy Anesthesia Type: MAC Level of consciousness: awake and alert Pain management: pain level controlled Vital Signs Assessment: post-procedure vital signs reviewed and stable Respiratory status: spontaneous breathing, nonlabored ventilation, respiratory function stable and patient connected to nasal cannula oxygen Cardiovascular status: stable and blood pressure returned to baseline Postop Assessment: no apparent nausea or vomiting Anesthetic complications: no    Last Vitals:  Vitals:   07/11/18 0910 07/11/18 0920  BP: 126/66 (!) 142/72  Pulse: 79 75  Resp: 16 (!) 22  Temp:    SpO2: 99% 94%    Last Pain:  Vitals:   07/11/18 0920  TempSrc:   PainSc: 0-No pain                 [name]

## 2018-07-11 NOTE — Transfer of Care (Signed)
Immediate Anesthesia Transfer of Care Note  Patient: [name]  Procedure(s) Performed: COLONOSCOPY WITH PROPOFOL (N/A ) ESOPHAGOGASTRODUODENOSCOPY (EGD) WITH PROPOFOL (N/A ) POLYPECTOMY  Patient Location: PACU  Anesthesia Type:MAC  Level of Consciousness: sedated, patient cooperative and responds to stimulation  Airway & Oxygen Therapy: Patient Spontanous Breathing and Patient connected to nasal cannula oxygen  Post-op Assessment: Report given to RN and Post -op Vital signs reviewed and stable  Post vital signs: Reviewed and stable  Last Vitals:  Vitals Value Taken Time  BP    Temp    Pulse 69 07/11/2018  8:59 AM  Resp 12 07/11/2018  8:59 AM  SpO2 96 % 07/11/2018  8:59 AM  Vitals shown include unvalidated device data.  Last Pain:  Vitals:   07/11/18 0746  TempSrc: Oral  PainSc: 0-No pain         Complications: No apparent anesthesia complications

## 2018-07-11 NOTE — Op Note (Signed)
[facility] Patient Name: [name] Procedure Date: 07/11/2018 MRN: [id number] Attending MD: Milus Banister , MD Date of Birth: 09-26-1940 CSN: [id number] Age: 78 Admit Type: Inpatient Procedure:                Colonoscopy Indications:              Heme positive stool, microcytic anemia; 2016 large                            ascending colon polyp noted by Dr. Havery Moros, then                            removed at [facility] Providers:                Milus Banister, MD, Cleda Daub, RN, Laverda Sorenson, Technician, Herbie Drape, CRNA Referring MD:              Medicines:                Monitored Anesthesia Care Complications:            No immediate complications. Estimated blood loss:                            None. Estimated Blood Loss:     Estimated blood loss: none. Procedure:                Pre-Anesthesia Assessment:                           - Prior to the procedure, a History and Physical                            was performed, and patient medications and                            allergies were reviewed. The patient's tolerance of                            previous anesthesia was also reviewed. The risks                            and benefits of the procedure and the sedation                            options and risks were discussed with the patient.                            All questions were answered, and informed consent                            was obtained. Prior Anticoagulants: The patient has  taken Xarelto (rivaroxaban), last dose was 2 days                            prior to procedure. ASA Grade Assessment: IV - A                            patient with severe systemic disease that is a                            constant threat to life. After reviewing the risks                            and benefits, the patient was deemed in                            satisfactory condition to  undergo the procedure.                           After obtaining informed consent, the colonoscope                            was passed under direct vision. Throughout the                            procedure, the patient's blood pressure, pulse, and                            oxygen saturations were monitored continuously. The                            CF-HQ190L ([id number] Olympus colonoscope was                            introduced through the anus and advanced to the the                            cecum, identified by appendiceal orifice and                            ileocecal valve. The colonoscopy was performed                            without difficulty. The patient tolerated the                            procedure well. The quality of the bowel                            preparation was good. The ileocecal valve,                            appendiceal orifice, and rectum were photographed. Scope In: 8:13:57 AM Scope Out: 8:37:56 AM Scope Withdrawal Time: 0 hours 19 minutes 46 seconds  Total Procedure Duration: 0 hours 23 minutes 59 seconds  Findings:      No blood in the colon.      The site of 2016 ascending colon polypectomy was easily located, aided       by previous submucosal tattoo. There was 1.5cm residual/recurrent       polypoid tissue at the site. The area was successfully injected with       Eleview for lesion assessment, and this injection appeared to lift the       lesion adequately. The polyp was then (apparently completely) removed       with a piecemeal technique using a hot snare. Resection and retrieval       were complete.      Multiple small-mouthed diverticula were found in the entire colon.      Internal hemorrhoids were found. The hemorrhoids were medium-sized.      Severa non-bleeding distal rectum AVMs, typical appearing for radiation       proctitis.      The exam was otherwise without abnormality on direct and retroflexion        views. Impression:               - The site of 2016 ascending colon polypectomy was                            easily located, aided by previous submucosal                            tattoo. There was 1.5cm residual/recurrent polypoid                            tissue at the site. The area was successfully                            injected with Eleview for lesion assessment, and                            this injection appeared to lift the lesion                            adequately. The polyp was then (apparently                            completely) removed with a piecemeal technique                            using a hot snare. Resection and retrieval were                            complete.                           - Diverticulosis in the entire examined colon.                           - Internal hemorrhoids.                           -  Radiation proctitis.                           - The examination was otherwise normal on direct                            and retroflexion views. Moderate Sedation:      Not Applicable - Patient had care per Anesthesia. Recommendation:           - EGD now to continue workup of his Heme +                            microcystic anemia.                           - I elected NOT to treat his radiation proctitis                            becuase it was not oozing and he never sees overt                            rectal bleeding and so I doubt the radiation damage                            or the hemorrhoids account for his anemia. Procedure Code(s):        --- Professional ---                           [phone], Colonoscopy, flexible; with removal of                            tumor(s), polyp(s), or other lesion(s) by snare                            technique                           45381, Colonoscopy, flexible; with directed                            submucosal injection(s), any substance Diagnosis Code(s):        --- Professional ---                            D12.2, Benign neoplasm of ascending colon                           K64.8, Other hemorrhoids                           R19.5, Other fecal abnormalities                           D50.9, Iron deficiency anemia, unspecified  K57.30, Diverticulosis of large intestine without                            perforation or abscess without bleeding CPT copyright 2018 American Medical Association. All rights reserved. The codes documented in this report are preliminary and upon coder review may  be revised to meet current compliance requirements. Milus Banister, MD 07/11/2018 8:58:54 AM This report has been signed electronically. Number of Addenda: 0

## 2018-07-11 NOTE — Discharge Summary (Signed)
Physician Discharge Summary  [name] VEH:[id number] DOB: Mar 14, 1941 DOA: 07/09/2018  PCP: Deland Pretty, MD  Admit date: 07/09/2018 Discharge date: 07/11/2018  Time spent: 40 minutes  Recommendations for Outpatient Follow-up:  1. Follow up outpatient CBC/CMP 2. Ensure follow up with GI.  Follow final biopsy results. 3. Started on iron.   4. Hold restarting xarelto until 1/20.   5. Follow volume status, instructed to resume lasix, follow volume status/kidney function  Discharge Diagnoses:  Principal Problem:   GI bleed Active Problems:   GERD   COLONIC POLYPS, ADENOMATOUS, HX OF   Dyspnea   Hypertension   History of pulmonary embolus (PE)   Anticoagulant long-term use   History of DVT of lower extremity   Heme positive stool   Polyp of ascending colon   Discharge Condition: stable  Diet recommendation: heart healthy  Filed Weights   07/09/18 1001  Weight: 104.3 kg    History of present illness:  [name] Arieon Corcoran 78 y.o.malewith past medical history of coronary artery disease, history of PE, DVT on Xarelto, history of GI bleed in the past with diverticulosis esophageal stricture,duodenitis and adenomatous polyps in the past presented to the hospitalwith complaints of worsening shortness of breath for the last month or so. Patient did have [place] dyspnea for couple of years but recently got worse. Ultimately went to his primary care physician and had Dezarai Prew blood work done which showed significant anemia with Neoma Uhrich hemoglobin of 5.9. Patient was then considered for admission to the hospital. Patient denied any chest pain, dizziness, lightheadedness or syncope. He denied any nausea, vomiting, hematemesis or melena. Patient denies any changes in his bowel habits. Patient denies urinary urgency frequency or hematuria. He is on Xarelto for his prior history of DVT/PE. Patient denies fever, runny nose sore throat or chills. There is no sick contacts or recent travel.Patient  states that he is not taking his Lasix because of the problem of frequent urination. He however admits to increasing swelling of his bilateral lower extremity.Patient states that he cannot lie flat because of his lower back pain. Of dyspnea on exertion and lives by himself at home with his wife. He states that he drinks beer every day does not get any withdrawal if he does not drink.  He was admitted for symptomatic anemia.  He received 3 units pRBC.  His symptoms improved.  He had upper and lower endoscopy without source of bleeding seen.  GI recommended hold xarelto until Monday.  Start iron.  Pending biopsies, may need further workup.   Hospital Course:  GI bleed. Patient with hx diverticulosis, hx barrets esophagus.  Reported hx GI bleed.   No gross bleeding on exam, but positive hemoccult and anemic Holding xarelto - per GI ok to resume Monday S/p EGD and colonoscopy -> barrett's esophagus, hiatal hernia, residual/recurrent polypoid tissue, diverticulosis, internal hemorrhoids, and radiation proctitis.  Biopsies taken as noted in formal report. Appreciate GI recs - planning for colonoscopy 1/18 Daily drinker, counseled by admitting physician  Symptomatic anemia   Improved Hb stable at 9.1 today Start iron on discharge  Shortness of Breath: likely 2/2 above.  Improved with blood.  He's got LE edema, but CXR without edema. Instructed to take his lasix as prescribed after discharge  History of DVT, pulmonary embolism. On Xarelto as outpatient. Can resume on Monday.  Continue to follow outpatient.  Procedures: EGD - Non-nodular Barrett's esophagus, biopsied. - Large hiatal hernia without evident Cameron's erosions. - Normal appearing duodenum was biopsied to  check for Celiac Sprue Impression: Not Applicable - Patient had care per Anesthesia. Moderate Sedation: - Return patient to hospital ward for ongoing care. - He's had NO overt GI bleeding and Jasier Calabretta very appropriate bump in  Hb since admission from 79 to 9.1 after 3 units PRBC. I think it is safe that he be discharged home later today. He should begin on once daily OTC iron supplement (probably indefinitely). Pending the biopsy results from todays colonsocopy and EGD he MAY need further testing as an outpatient (such as small bowel capsule endoscopy). It is safe for him to restart his blood thinner on Monday (two days from now). - Please call, page with any further questions or concerns.  Colonoscopy - The site of 2016 ascending colon polypectomy was easily located, aided by previous submucosal tattoo. There was 1.5cm residual/recurrent polypoid tissue at the site. The area was successfully injected with Eleview for lesion assessment, and this injection appeared to lift the lesion adequately. The polyp was then (apparently completely) removed with Niv Darley piecemeal technique using Aeryn Medici hot snare. Resection and retrieval were complete. - Diverticulosis in the entire examined colon. - Internal hemorrhoids. - Radiation proctitis. - The examination was otherwise normal on direct and retroflexion views. Impression: Not Applicable - Patient had care per Anesthesia. Moderate Sedation: - EGD now to continue workup of his Heme + microcystic anemia. - I elected NOT to treat his radiation proctitis becuase it was not oozing and he never sees overt rectal bleeding and so I doubt the radiation damage or the hemorrhoids account for his anemia.  Consultations:  GI  Discharge Exam: Vitals:   07/11/18 0920 07/11/18 1232  BP: (!) 142/72 (!) 161/80  Pulse: 75 64  Resp: (!) 22   Temp:  97.7 F (36.5 C)  SpO2: 94% 97%   Feels ready to go. Wife at bedside  General: No acute distress. Cardiovascular: Heart sounds show Gwendolen Hewlett regular rate, and rhythm Lungs: Clear to auscultation bilaterally  Abdomen: Soft, nontender, nondistended  Neurological: Alert and oriented 3. Moves all extremities 4. Cranial nerves II through XII  grossly intact. Skin: Warm and dry. No rashes or lesions. Extremities: No clubbing or cyanosis. Mild 1+ edema Psychiatric: Mood and affect are normal. Insight and judgment are appropriate.  Discharge Instructions   Discharge Instructions    Call MD for:  difficulty breathing, headache or visual disturbances   Complete by:  As directed    Call MD for:  extreme fatigue   Complete by:  As directed    Call MD for:  hives   Complete by:  As directed    Call MD for:  persistant dizziness or light-headedness   Complete by:  As directed    Call MD for:  persistant nausea and vomiting   Complete by:  As directed    Call MD for:  redness, tenderness, or signs of infection (pain, swelling, redness, odor or green/yellow discharge around incision site)   Complete by:  As directed    Call MD for:  severe uncontrolled pain   Complete by:  As directed    Call MD for:  temperature >100.4   Complete by:  As directed    Diet - low sodium heart healthy   Complete by:  As directed    Discharge instructions   Complete by:  As directed    You were seen with anemia.  Your fatigue and shortness of breath have improved after transfusion.  Stop your xarelto until Monday.  Your colonoscopy and EGD did not show Lajuanda Penick clear source of bleeding.  The gastroenterologist took biopsies and removed Rubby Barbary polyp.  They should follow up with you the results some time next week.  You may need Jackie Littlejohn further workup pending the biopsy results.    Start iron and take this daily.   Take your lasix daily, I've sent in Rilya Longo new prescription for you.  Please follow up with your PCP early next week to recheck labs and follow up this hospitalization.  Return for new, recurrent, or worsening symptoms.  Please ask your PCP to request records from this hospitalization so they know what was done and what the next steps will be.   Increase activity slowly   Complete by:  As directed      Allergies as of 07/11/2018   No Known  Allergies     Medication List    STOP taking these medications   celecoxib 200 MG capsule Commonly known as:  CELEBREX     TAKE these medications   allopurinol 300 MG tablet Commonly known as:  ZYLOPRIM Take 300 mg by mouth every morning.   ferrous sulfate 325 (65 FE) MG tablet Take 1 tablet (325 mg total) by mouth daily for 30 days.   furosemide 20 MG tablet Commonly known as:  LASIX Take 1 tablet (20 mg total) by mouth daily for 30 days.   HYDROcodone-acetaminophen 5-325 MG tablet Commonly known as:  NORCO/VICODIN Take 1 tablet by mouth 2 (two) times daily. What changed:  Another medication with the same name was removed. Continue taking this medication, and follow the directions you see here.   olmesartan 20 MG tablet Commonly known as:  BENICAR Take 0.5 tablets (10 mg total) by mouth daily.   XARELTO 20 MG Tabs tablet Generic drug:  rivaroxaban TAKE 1 TABLET BY MOUTH EVERY DAY WITH SUPPER What changed:  See the new instructions.      No Known Allergies Follow-up Information    Deland Pretty, MD Follow up.   Specialty:  Internal Medicine Contact information: [address] [place] [address] [phone]        Lelon Perla, MD .   Specialty:  Cardiology Contact information: [address] [place] [address] [phone]        Milus Banister, MD Follow up.   Specialty:  Gastroenterology Contact information: [address] [place] [address] [phone]            The results of significant diagnostics from this hospitalization (including imaging, microbiology, ancillary and laboratory) are listed below for reference.    Significant Diagnostic Studies: Dg Chest 2 View  Result Date: 07/09/2018 CLINICAL DATA:  Shortness of breath worsening over last 2 years. EXAM: CHEST - 2 VIEW COMPARISON:  None. FINDINGS: The heart size and mediastinal contours are stable. The aorta is tortuous. Heart size  is enlarged. Mild scar is identified in the right lung base. There is no focal infiltrate, pulmonary edema, or pleural effusion. The visualized skeletal structures are stable. IMPRESSION: No active cardiopulmonary disease. Electronically Signed   By: Abelardo Diesel M.D.   On: 07/09/2018 11:33    Microbiology: No results found for this or any previous visit (from the past 240 hour(s)).   Labs: Basic Metabolic Panel: Recent Labs  Lab 07/08/18 1203 07/09/18 1153 07/10/18 0542 07/11/18 0541  NA 143 139 137 142  K 4.5 4.7 3.9 3.4*  CL 108* 106 105 106  CO2 20  22 23 23   GLUCOSE 95 91 91 111*  BUN 25 27* 17 13  CREATININE 0.88 0.94 0.83 0.97  CALCIUM 8.1* 8.3* 7.7* 8.2*  MG  --   --   --  2.2   Liver Function Tests: Recent Labs  Lab 07/09/18 1153 07/11/18 0541  AST 25 13*  ALT 10 10  ALKPHOS 51 53  BILITOT 0.5 1.1  PROT 6.6 6.4*  ALBUMIN 4.0 3.8   No results for input(s): LIPASE, AMYLASE in the last 168 hours. No results for input(s): AMMONIA in the last 168 hours. CBC: Recent Labs  Lab 07/08/18 1203 07/09/18 1153 07/10/18 0542 07/10/18 0754 07/10/18 1540 07/11/18 0541  WBC 2.7* 4.2 3.8*  --   --  4.5  NEUTROABS  --  3.0  --   --   --   --   HGB 5.6* 5.9* 7.0* 7.1* 8.8* 9.1*  HCT 20.2* 22.1* 23.9* 24.1* 29.5* 30.9*  MCV 78* 82.5 80.5  --   --  82.0  PLT 288 249 225  --   --  249   Cardiac Enzymes: No results for input(s): CKTOTAL, CKMB, CKMBINDEX, TROPONINI in the last 168 hours. BNP: BNP (last 3 results) Recent Labs    07/08/18 1203  BNP 100.0    ProBNP (last 3 results) No results for input(s): PROBNP in the last 8760 hours.  CBG: No results for input(s): GLUCAP in the last 168 hours.     Signed:  Fayrene Helper MD.  [facility] 07/11/2018, 2:30 PM

## 2018-07-11 NOTE — Interval H&P Note (Signed)
History and Physical Interval Note:  07/11/2018 7:47 AM  [name]  has presented today for surgery, with the diagnosis of heme + anemia  The various methods of treatment have been discussed with the patient and family. After consideration of risks, benefits and other options for treatment, the patient has consented to  Procedure(s): COLONOSCOPY WITH PROPOFOL (N/A) ESOPHAGOGASTRODUODENOSCOPY (EGD) WITH PROPOFOL (N/A) as a surgical intervention .  The patient's history has been reviewed, patient examined, no change in status, stable for surgery.  I have reviewed the patient's chart and labs.  Questions were answered to the patient's satisfaction.     Milus Banister

## 2018-07-11 NOTE — Op Note (Signed)
[facility] Patient Name: [name] Procedure Date: 07/11/2018 MRN: [id number] Attending MD: Milus Banister , MD Date of Birth: [birth year] CSN: [id number] Age: 78 Admit Type: Inpatient Procedure:                Upper GI endoscopy Indications:              microcytic anemia, heme + stools, 2016 EGD Dr.                            Havery Moros found Barrett's esophagus (no dysplasia                            on path) Providers:                Milus Banister, MD, Cleda Daub, RN, Laverda Sorenson, Technician, Herbie Drape, CRNA Referring MD:              Medicines:                Monitored Anesthesia Care Complications:            No immediate complications. Estimated blood loss:                            None. Estimated Blood Loss:     Estimated blood loss: none. Procedure:                Pre-Anesthesia Assessment:                           - Prior to the procedure, a History and Physical                            was performed, and patient medications and                            allergies were reviewed. The patient's tolerance of                            previous anesthesia was also reviewed. The risks                            and benefits of the procedure and the sedation                            options and risks were discussed with the patient.                            All questions were answered, and informed consent                            was obtained. Prior Anticoagulants: The patient has  taken Xarelto (rivaroxaban), last dose was 2 days                            prior to procedure. ASA Grade Assessment: IV - A                            patient with severe systemic disease that is a                            constant threat to life. After reviewing the risks                            and benefits, the patient was deemed in                            satisfactory condition to undergo  the procedure.                           After obtaining informed consent, the endoscope was                            passed under direct vision. Throughout the                            procedure, the patient's blood pressure, pulse, and                            oxygen saturations were monitored continuously. The                            [id number] ([id number] Olympus gastroscope was                            introduced through the mouth, and advanced to the                            second part of duodenum. The upper GI endoscopy was                            accomplished without difficulty. The patient                            tolerated the procedure well. Scope In: Scope Out: Findings:      Non-nodular, Prague classification C4M4 Barrett's esophagus was noted in       the distal esophagus. The EG landmarks were not clear given the large       hiatal hernia however it looks like the EG junction is at 32cm from the       incisors and the proximal edge of the Barrett's mucosa is at 36cm from       the incisors. Mucosa was biopsied with a cold forceps for histology in 4       quadrants at intervals of 2 cm. A total of 3 specimen bottles were sent  to pathology (32cm, 34cm, 36cm).      Large hiatal hernia without evident Cameron's erosions.      The exam was otherwise normal.      The normal appearing duodenum was biopsied to check for Celiac Sprue Impression:               - Non-nodular Barrett's esophagus, biopsied.                           - Large hiatal hernia without evident Cameron's                            erosions.                           - Normal appearing duodenum was biopsied to check                            for Celiac Sprue Moderate Sedation:      Not Applicable - Patient had care per Anesthesia. Recommendation:           - Return patient to hospital ward for ongoing care.                           - He's had NO overt GI bleeding and a very                             appropriate bump in Hb since admission from 5.6 to                            9.1 after 3 units PRBC. I think it is safe that he                            be discharged home later today. He should begin on                            once daily OTC iron supplement (probably                            indefinitely). Pending the biopsy results from                            todays colonsocopy and EGD he MAY need further                            testing as an outpatient (such as small bowel                            capsule endoscopy). It is safe for him to restart                            his blood thinner on Monday (two days from now).                           -  Please call, page with any further questions or                            concerns. Procedure Code(s):        --- Professional ---                           [phone], Esophagogastroduodenoscopy, flexible,                            transoral; with biopsy, single or multiple Diagnosis Code(s):        --- Professional ---                           K22.70, Barrett's esophagus without dysplasia                           D50.9, Iron deficiency anemia, unspecified CPT copyright 2018 American Medical Association. All rights reserved. The codes documented in this report are preliminary and upon coder review may  be revised to meet current compliance requirements. Milus Banister, MD 07/11/2018 9:09:18 AM This report has been signed electronically. Number of Addenda: 0

## 2018-07-11 NOTE — Anesthesia Preprocedure Evaluation (Signed)
Anesthesia Evaluation  Patient identified by MRN, date of birth, ID band Patient awake    Reviewed: Allergy & Precautions, NPO status , Patient's Chart, lab work & pertinent test results  Airway Mallampati: II  TM Distance: >3 FB Neck ROM: Full    Dental  (+) Dental Advisory Given, Implants Upper and lower implants--full set of teeth :   Pulmonary shortness of breath and with exertion, former smoker, PE (2005)   Pulmonary exam normal breath sounds clear to auscultation       Cardiovascular hypertension, Pt. on medications + CAD, + Peripheral Vascular Disease (abdominal aortic aneurysm) and + DVT  Normal cardiovascular exam+ dysrhythmias (RBBB)  Rhythm:Regular Rate:Normal  Echo 05/01/17: Study Conclusions  - Left ventricle: The cavity size was normal. There was mild concentric hypertrophy. Systolic function was normal. The estimated ejection fraction was in the range of 55% to 60%. Wall motion was normal; there were no regional wall motion abnormalities. Doppler parameters are consistent with abnormal left ventricular relaxation (grade 1 diastolic dysfunction). - Aortic valve: There was trivial regurgitation. - Mitral valve: Systolic bowing without prolapse. There was mild to moderate regurgitation directed centrally. - Left atrium: The atrium was mildly dilated. - Atrial septum: There was an atrial septal aneurysm. - Pulmonary arteries: Systolic pressure was mildly to moderately increased. PA peak pressure: 46 mm Hg (S).   Neuro/Psych S/p evacuation of SDH 2013 negative psych ROS   GI/Hepatic Neg liver ROS, GERD (h/o Barrett's esophagus)  Medicated and Poorly Controlled,  Endo/Other  negative endocrine ROS  Renal/GU negative Renal ROS   Prostate cancer    Musculoskeletal  (+) Arthritis , Osteoarthritis,    Abdominal   Peds  Hematology  (+) Blood dyscrasia (Xarelto), anemia ,   Anesthesia Other Findings   Reproductive/Obstetrics                             Anesthesia Physical  Anesthesia Plan  ASA: III  Anesthesia Plan: MAC   Post-op Pain Management:    Induction: Intravenous  PONV Risk Score and Plan: 3 and Treatment may vary due to age or medical condition  Airway Management Planned: Nasal Cannula  Additional Equipment:   Intra-op Plan:   Post-operative Plan:   Informed Consent: I have reviewed the patients History and Physical, chart, labs and discussed the procedure including the risks, benefits and alternatives for the proposed anesthesia with the patient or authorized representative who has indicated his/her understanding and acceptance.     Dental advisory given  Plan Discussed with: CRNA  Anesthesia Plan Comments:         Anesthesia Quick Evaluation

## 2018-07-11 NOTE — Progress Notes (Signed)
[name] to be D/C'd Home per MD order.  Discussed prescriptions and follow up appointments with the patient. Prescriptions given to patient, medication list explained in detail. Pt verbalized understanding.  Allergies as of 07/11/2018   No Known Allergies     Medication List    STOP taking these medications   celecoxib 200 MG capsule Commonly known as:  CELEBREX     TAKE these medications   allopurinol 300 MG tablet Commonly known as:  ZYLOPRIM Take 300 mg by mouth every morning.   ferrous sulfate 325 (65 FE) MG tablet Take 1 tablet (325 mg total) by mouth daily for 30 days.   furosemide 20 MG tablet Commonly known as:  LASIX Take 1 tablet (20 mg total) by mouth daily for 30 days.   HYDROcodone-acetaminophen 5-325 MG tablet Commonly known as:  NORCO/VICODIN Take 1 tablet by mouth 2 (two) times daily. What changed:  Another medication with the same name was removed. Continue taking this medication, and follow the directions you see here.   olmesartan 20 MG tablet Commonly known as:  BENICAR Take 0.5 tablets (10 mg total) by mouth daily.   XARELTO 20 MG Tabs tablet Generic drug:  rivaroxaban TAKE 1 TABLET BY MOUTH EVERY DAY WITH SUPPER What changed:  See the new instructions.       Vitals:   07/11/18 0920 07/11/18 1232  BP: (!) 142/72 (!) 161/80  Pulse: 75 64  Resp: (!) 22   Temp:  97.7 F (36.5 C)  SpO2: 94% 97%    Skin clean, dry and intact without evidence of skin break down, no evidence of skin tears noted. IV catheter discontinued intact. Site without signs and symptoms of complications. Dressing and pressure applied. Pt denies pain at this time. No complaints noted.  An After Visit Summary was printed and given to the patient. Patient escorted via [place], and D/C home via private auto.  Haywood Lasso BSN, RN [employer] Phone [phone]

## 2018-07-13 ENCOUNTER — Encounter (HOSPITAL_COMMUNITY): Payer: Self-pay | Admitting: Gastroenterology

## 2018-07-13 ENCOUNTER — Other Ambulatory Visit: Payer: Self-pay | Admitting: *Deleted

## 2018-07-13 DIAGNOSIS — D649 Anemia, unspecified: Secondary | ICD-10-CM

## 2018-07-13 DIAGNOSIS — I2510 Atherosclerotic heart disease of native coronary artery without angina pectoris: Secondary | ICD-10-CM

## 2018-07-14 ENCOUNTER — Other Ambulatory Visit: Payer: Self-pay | Admitting: *Deleted

## 2018-07-14 NOTE — Patient Outreach (Signed)
[facility]) Care Management  07/14/2018  [name] 1940/11/24 [id number]   Subjective: Telephone call to patient's home number, no answer, left HIPAA compliant voicemail message, and requested call back.    Objective: Per KPN (Knowledge Performance Now, point of care tool) and chart review, patient hospitalized 07/09/2018 - 07/11/2018 for gastrointestinal bleed (GI Bleed).   Patient also has a history of coronary artery disease, diverticulosis esophageal stricture, AAA (abdominal aortic aneurysm), Barrett's esophagus, gout, DVT of lower extremity (left lower), pulmonary embolus (PE), Prostate cancer with radiation therapy, Pulmonary HT, Polyp of ascending colon, and RBBB (right bundle branch block with left anterior fascicular block).        Assessment: Received [employer] EMMI  General Discharge follow up referral on 07/14/2018.   Red Flag trigger, Day #1, patient answered no to the following question: Scheduled follow-up?   Patient answered yes to the following question: Unfilled prescriptions?    [facility] EMMI follow up pending patient contact.      Plan: RNCM will send unsuccessful outreach  letter, [facility] pamphlet, will call patient for 2nd telephone outreach attempt within 4 business days, [facility] EMMI follow up, and proceed with case closure, within 10 business days if no return call.       Keslee Harrington H. Annia Friendly, BSN, [place] Management [facility] Telephonic CM Phone: [phone] Fax: [phone]

## 2018-07-15 ENCOUNTER — Other Ambulatory Visit: Payer: Self-pay | Admitting: *Deleted

## 2018-07-15 NOTE — Patient Outreach (Addendum)
[facility]) Care Management  07/15/2018  [name] 01-22-1941 [id number]   Subjective: Telephone call to patient's home number, spoke with patient, and HIPAA verified.  Discussed [facility] Care Management [employer] EMMI General Discharge follow up, patient voiced understanding, and is in agreement to brief follow up.  Patient states he is doing well, has been receiving automated calls, is in the process of making follow up appointment with primary MD, making follow up appointment with gastroenterologist, declined this RNCM assistance to set up appointment, and states he will follow up to schedule appointments. Patient states he has a follow up appointment with cardiologist on 08/21/2018.   Patient states he has filled all of his prescriptions and has no issues with any of his medications. Patient states he does not have any education material, transition of care, care coordination, disease management, disease monitoring, transportation, community resource, or pharmacy needs at this time. States he is very appreciative of the follow up and is in agreement to receive [place] Management information.      Objective: Per KPN (Knowledge Performance Now, point of care tool) and chart review, patient hospitalized 07/09/2018 - 07/11/2018 for gastrointestinal bleed (GI Bleed).   Patient also has a history of coronary artery disease, diverticulosis esophageal stricture, AAA (abdominal aortic aneurysm), Barrett's esophagus, gout, DVT of lower extremity (left lower), pulmonary embolus (PE), Prostate cancer with radiation therapy, Pulmonary HT, Polyp of ascending colon, and RBBB (right bundle branch block with left anterior fascicular block).        Assessment: Received [employer] EMMI  General Discharge follow up referral on 07/14/2018.   Red Flag trigger, Day #1, patient answered no to the following question: Scheduled follow-up?   Patient answered yes to the following  question: Unfilled prescriptions?    EMMI follow up completed and no further care management needs.       Plan: RNCM will send patient successful outreach letter, [facility] pamphlet, and magnet. RNCM will complete case closure due to follow up completed / no care management needs.        Aminah Zabawa H. Annia Friendly, BSN, [place] Management [facility] Telephonic CM Phone: [phone] Fax: [phone]

## 2018-07-16 ENCOUNTER — Other Ambulatory Visit: Payer: Self-pay | Admitting: Nurse Practitioner

## 2018-07-17 DIAGNOSIS — I2510 Atherosclerotic heart disease of native coronary artery without angina pectoris: Secondary | ICD-10-CM | POA: Diagnosis not present

## 2018-07-17 LAB — COMPREHENSIVE METABOLIC PANEL
ALT: 7 IU/L (ref 0–44)
AST: 9 IU/L (ref 0–40)
Albumin/Globulin Ratio: 1.7 (ref 1.2–2.2)
Albumin: 4 g/dL (ref 3.7–4.7)
Alkaline Phosphatase: 69 IU/L (ref 39–117)
BUN/Creatinine Ratio: 29 — ABNORMAL HIGH (ref 10–24)
BUN: 31 mg/dL — ABNORMAL HIGH (ref 8–27)
Bilirubin Total: 0.3 mg/dL (ref 0.0–1.2)
CO2: 20 mmol/L (ref 20–29)
Calcium: 8.6 mg/dL (ref 8.6–10.2)
Chloride: 102 mmol/L (ref 96–106)
Creatinine, Ser: 1.08 mg/dL (ref 0.76–1.27)
GFR calc Af Amer: 76 mL/min/{1.73_m2} (ref >59)
GFR, EST NON AFRICAN AMERICAN: 65 mL/min/{1.73_m2} (ref >59)
Globulin, Total: 2.3 g/dL (ref 1.5–4.5)
Glucose: 78 mg/dL (ref 65–99)
Potassium: 4.8 mmol/L (ref 3.5–5.2)
Sodium: 139 mmol/L (ref 134–144)
TOTAL PROTEIN: 6.3 g/dL (ref 6.0–8.5)

## 2018-07-20 ENCOUNTER — Telehealth: Payer: Self-pay

## 2018-07-20 DIAGNOSIS — D649 Anemia, unspecified: Secondary | ICD-10-CM

## 2018-07-20 NOTE — Telephone Encounter (Signed)
Called and spoke to pt.  He understands to go to the lab the week of Feb 10th. CBC entered.  Pt has appt Friday, 2-21

## 2018-07-20 NOTE — Telephone Encounter (Signed)
-----   Message from Yetta Flock, MD sent at 07/20/2018 12:55 PM EST ----- Regarding: RE: needs follow up appointment Feb 21st is fine. Would be good for him to go to the lab in 2 weeks if he is able to do that.  Dr. Ardis Hughs sent a message previously to Patty to call him, not sure if she did that. Polyp removed was pre-cancerous, I will discuss it with him at time of clinic app. Thanks ----- Message ----- From: Roetta Sessions, CMA Sent: 07/20/2018  12:20 PM EST To: Yetta Flock, MD Subject: RE: needs follow up appointment                Dr. Loni Muse - Pt schedule for Feb 21st which is a little over 3 weeks.  Do you want him to go to lab the week before or can they do lab work when he comes in for his appt?  Also, they are wondering if someone is going to call them with the results of the colonoscopy Dr. Ardis Hughs did at [facility] on 1-18.  Thanks, Jan    ----- Message ----- From: Yetta Flock, MD Sent: 07/20/2018  11:48 AM EST To: Roetta Sessions, CMA Subject: FW: needs follow up appointment                Jan can you help coordinate a follow up visit with me in a few weeks if available. He also needs a CBC in about 2 weeks. Thanks  ----- Message ----- From: Noralyn Pick, NP Sent: 07/16/2018   1:09 PM EST To: Yetta Flock, MD Subject: needs follow up appointment                    Hello Dr. Havery Moros, I saw [name] last week when he was at [facility]. His EGD and colonoscopy do not explain his iron deficiency anemia. His discharge summary advised GI follow up, however, he has not yet scheduled an office visit. Can you facilitate a follow up appointment? I know he would like to see you in office. Thank you. [employer]

## 2018-07-21 DIAGNOSIS — K922 Gastrointestinal hemorrhage, unspecified: Secondary | ICD-10-CM | POA: Diagnosis not present

## 2018-07-21 DIAGNOSIS — I10 Essential (primary) hypertension: Secondary | ICD-10-CM | POA: Diagnosis not present

## 2018-07-21 DIAGNOSIS — Z9289 Personal history of other medical treatment: Secondary | ICD-10-CM | POA: Diagnosis not present

## 2018-07-21 DIAGNOSIS — D509 Iron deficiency anemia, unspecified: Secondary | ICD-10-CM | POA: Diagnosis not present

## 2018-07-28 ENCOUNTER — Ambulatory Visit: Payer: PPO | Admitting: Physician Assistant

## 2018-07-28 DIAGNOSIS — I10 Essential (primary) hypertension: Secondary | ICD-10-CM | POA: Diagnosis not present

## 2018-07-28 DIAGNOSIS — K922 Gastrointestinal hemorrhage, unspecified: Secondary | ICD-10-CM | POA: Diagnosis not present

## 2018-07-29 DIAGNOSIS — Z8546 Personal history of malignant neoplasm of prostate: Secondary | ICD-10-CM | POA: Diagnosis not present

## 2018-08-05 DIAGNOSIS — N3040 Irradiation cystitis without hematuria: Secondary | ICD-10-CM | POA: Diagnosis not present

## 2018-08-05 DIAGNOSIS — R3912 Poor urinary stream: Secondary | ICD-10-CM | POA: Diagnosis not present

## 2018-08-05 DIAGNOSIS — Z8546 Personal history of malignant neoplasm of prostate: Secondary | ICD-10-CM | POA: Diagnosis not present

## 2018-08-05 DIAGNOSIS — R3121 Asymptomatic microscopic hematuria: Secondary | ICD-10-CM | POA: Diagnosis not present

## 2018-08-07 NOTE — Progress Notes (Signed)
HPI: FU CAD. Pt with h/ounexplained pulmonary embolus years ago treated with one year of Coumadin. Patient was admitted in October of 2013 and found to have a subdural hematoma which required evacuation. Seen for dyspnea 10/15. Cardiac catheterization October 2015 showed an 80% second diagonal but otherwise nonobstructive coronary disease. Ejection fraction 55-65%. Chest CT October 2015 showed bilateral pulmonary emboli. Echocardiogram May 2019 showed normal LV function and mild mitral regurgitation.  Abdominal ultrasound July 2019 showed 3.2 cm abdominal aortic aneurysm.   Patient admitted January 2020 with dyspnea and hemoglobin 5.9.  Patient was transfused.  He had EGD and colonoscopy that revealed Barrett's esophagus, hiatal hernia, diverticulosis, internal hemorrhoids and radiation proctitis.  Xarelto ultimately resumed.  Since I last saw him,  he does have dyspnea on exertion which is a chronic issue.  It was worse when he was anemic.  He denies chest pain.  Mild pedal edema.  No syncope or GI blood loss.  Current Outpatient Medications  Medication Sig Dispense Refill  . allopurinol (ZYLOPRIM) 300 MG tablet Take 300 mg by mouth every morning.     . celecoxib (CELEBREX) 200 MG capsule Take 20,400 mg by mouth as needed.    . ferrous sulfate 325 (65 FE) MG tablet Take 1 tablet (325 mg total) by mouth daily for 30 days. 30 tablet 0  . furosemide (LASIX) 20 MG tablet Take 1 tablet (20 mg total) by mouth daily for 30 days. 30 tablet 0  . HYDROcodone-acetaminophen (NORCO/VICODIN) 5-325 MG tablet Take 1 tablet by mouth 2 (two) times daily.    Marland Kitchen olmesartan (BENICAR) 20 MG tablet Take 0.5 tablets (10 mg total) by mouth daily. 15 tablet 3  . omeprazole (PRILOSEC) 20 MG capsule Take 1 capsule (20 mg total) by mouth daily. 90 capsule 3  . XARELTO 20 MG TABS tablet TAKE 1 TABLET BY MOUTH EVERY DAY WITH SUPPER (Patient taking differently: Take 20 mg by mouth daily with supper. ) 90 tablet 1   No  current facility-administered medications for this visit.      Past Medical History:  Diagnosis Date  . AAA (abdominal aortic aneurysm) ([place]) followed by dr Stanford Breed   last duplex 07-17-2017  3.2 x 3.3  . Anticoagulant long-term use    xarelto for hx PEs and DVT  . Arthritis    back  . Barrett's esophagus 2009  . Bilateral lower extremity edema    chronic pedal  . CAD (coronary artery disease) cardioloigst--  dr Stanford Breed   cardiac cath 04-12-2014  single vessel obstructive cad involving ostrium D2, and other nonobstructive cad , normall lvf  . Chronic lower back pain   . 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[place])    per duplex 07-17-2017  left cia 2 x 2, right cia 2.1 x 2  . Diverticulosis   . Dyspnea    with exertion   . Dyspnea on exertion   . FH: colonic polyps   . GERD (gastroesophageal reflux disease)   . Gout    08-27-2017  per pt stable  . History of adenomatous polyp of colon   . History of DVT of lower extremity    10/ 2015  LLE  . History of esophageal stricture    post dilation  . History of gastric polyp    benign  . History of pulmonary embolus (PE)    2005  treated 1 yr w/ coumadin and 10/ 2015  bilateral  . History of radiation therapy 06/22/2012-08/18/2012   77  Gy to prostate (external beam)  . History of subdural hematoma 03/2012   s/p  evacuation  . Hypertension   . Lesion of bladder    non canerous  . Prostate cancer [facility]) urologist-  dr wrenn/ oncologist- dr Sondra Come   dx 07/ 2013--  Stage T1c, [place] 7, vol 67cc--- completed external beam radiation therapy 08-18-2012  . Pulmonary HTN ([place]) 04/12/2014   last echo 05-01-2017  PASP 71mmHg  . RBBB (right bundle branch block with left anterior fascicular block)    ocassional    Past Surgical History:  Procedure Laterality Date  . BIOPSY  07/11/2018   Procedure: BIOPSY;  Surgeon: Milus Banister, MD;  Location: [facility];  Service: Endoscopy;;  . CATARACT EXTRACTION W/ INTRAOCULAR LENS  IMPLANT, BILATERAL   2018  . COLONOSCOPY WITH PROPOFOL N/A 04/25/2015   Procedure: COLONOSCOPY WITH PROPOFOL;  Surgeon: Manus Gunning, MD;  Location: [facility];  Service: Gastroenterology;  Laterality: N/A;  . COLONOSCOPY WITH PROPOFOL N/A 07/11/2018   Procedure: COLONOSCOPY WITH PROPOFOL;  Surgeon: Milus Banister, MD;  Location: [facility];  Service: Endoscopy;  Laterality: N/A;  . CRANIOTOMY  04/03/2012   Procedure: CRANIOTOMY HEMATOMA EVACUATION SUBDURAL;  Surgeon: Erline Levine, MD;  Location: [facility];  Service: Neurosurgery;  Laterality: Left;  Left Craniotomy for Evacuation of Subdural Hematoma  . CYSTOSCOPY WITH BIOPSY N/A 09/02/2017   Procedure: CYSTOSCOPY WITH BIOPSY AND FULGURATION POSSIBLE TRANSURETHRAL RESECTION OF BLADDER TUMOR;  Surgeon: [name], MD;  Location: [facility];  Service: Urology;  Laterality: N/A;  . ESOPHAGOGASTRODUODENOSCOPY (EGD) WITH PROPOFOL N/A 04/25/2015   Procedure: ESOPHAGOGASTRODUODENOSCOPY (EGD) WITH PROPOFOL;  Surgeon: Manus Gunning, MD;  Location: [facility];  Service: Gastroenterology;  Laterality: N/A;  . ESOPHAGOGASTRODUODENOSCOPY (EGD) WITH PROPOFOL N/A 07/11/2018   Procedure: ESOPHAGOGASTRODUODENOSCOPY (EGD) WITH PROPOFOL;  Surgeon: Milus Banister, MD;  Location: [facility];  Service: Endoscopy;  Laterality: N/A;  . IRRIGATION AND DEBRIDEMENT SEBACEOUS CYST  01-11-13   "off my back"  . KYPHOPLASTY N/A 12/23/2017   Procedure: Lumbar One KYPHOPLASTY;  Surgeon: Erline Levine, MD;  Location: [place];  Service: Neurosurgery;  Laterality: N/A;  Lumbar One KYPHOPLASTY  . LEFT AND RIGHT HEART CATHETERIZATION WITH CORONARY ANGIOGRAM N/A 04/12/2014   Procedure: LEFT AND RIGHT HEART CATHETERIZATION WITH CORONARY ANGIOGRAM;  Surgeon: Peter M Martinique, MD;  Location[facility] C[facility] LAB;  Service: Cardiovascular;  Laterality: N/A;  . ORIF FEMUR FRACTURE Left 10-02-2010   dr Alvan Dame   subtrochanteric  . POLYPECTOMY  07/11/2018   Procedure: POLYPECTOMY;   Surgeon: Milus Banister, MD;  Location: [facility];  Service: Endoscopy;;  . TONSILLECTOMY  1940's  . TRANSTHORACIC ECHOCARDIOGRAM  05/01/2017   mild concentric LVH, ef 92-42%, grade 1 diastolic dysfunction/ trivial AR/ mild dilated ascending aorta/ mild to moderate MR (no stenosis)/ mild LAE/ mild TR/ mild to moderate increase PASP 64mmHg    Social History   Socioeconomic History  . Marital status: Married    Spouse name: Not on file  . Number of children: 3  . Years of education: Not on file  . Highest education level: Not on file  Occupational History  . 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 Financial resource strain: Not on file  . Food insecurity:    Worry: Not on file    Inability: Not on file  . Transportation needs:    Medical: Not on file  Non-medical: Not on file  Tobacco Use  . Smoking status: Former Smoker    Packs/day: 1.00    Years: 20.00    Pack years: 20.00    Types: Cigarettes    Last attempt to quit: 12/23/1971    Years since quitting: 46.6  . Smokeless tobacco: Never Used  Substance and Sexual Activity  . Alcohol use: Yes    Alcohol/week: 14.0 standard drinks    Types: 14 Cans of beer per week    Comment: 2 beer daily  . Drug use: No  . Sexual activity: Yes  Lifestyle  . Physical activity:    Days per week: Not on file    Minutes per session: Not on file  . Stress: Not on file  Relationships  . Social connections:    Talks on phone: Not on file    Gets together: Not on file    Attends religious service: Not on file    Active member of club or organization: Not on file    Attends meetings of clubs or organizations: Not on file    Relationship status: Not on file  . Intimate partner violence:    Fear of current or ex partner: Not on file    Emotionally abused: Not on file    Physically abused: Not on file    Forced sexual activity: Not on file  Other Topics Concern  . Not on file    Social History Narrative   Originally from [place].  Retired to [employer] because he was tired of all the snow.    Family History  Problem Relation Age of Onset  . Macular degeneration Mother   . Osteoarthritis Mother   . Aneurysm Father        d/o 79 yo, ruptured AAA  . Heart disease Other        No family history  . Colon cancer Neg Hx   . Colon polyps Neg Hx   . Diabetes Neg Hx     ROS: Chronic back pain but no fevers or chills, productive cough, hemoptysis, dysphasia, odynophagia, melena, hematochezia, dysuria, hematuria, rash, seizure activity, orthopnea, PND, claudication. Remaining systems are negative.  Physical Exam: Well-developed well-nourished in no acute distress.  Skin is warm and dry.  HEENT is normal.  Neck is supple.  Chest is clear to auscultation with normal expansion.  Cardiovascular exam is regular rate and rhythm.  Abdominal exam nontender or distended. No masses palpated. Extremities show 1+ ankle edema. neuro grossly intact  A/P  1 coronary artery disease-mild on previous catheterization.  No chest pain.  He declined statins previously.  No aspirin given need for chronic anticoagulation.  2 history of recurrent pulmonary emboli-patient needs lifelong anticoagulation.  Continue Xarelto.  Hemoglobin monitored by primary care.  3 abdominal aortic aneurysm-follow-up abdominal ultrasound July 2020.  4 hypertension-patient's blood pressure controlled today.  Continue present medications and follow.  5 dyspnea-is felt secondary to residual effects from previous pulmonary emboli.  Also probable contribution from deconditioning and restrictive lung disease.  6 history of GI bleeding/iron deficiency anemia-followed by primary care and gastroenterology.  Kirk Ruths, MD

## 2018-08-14 ENCOUNTER — Ambulatory Visit: Payer: PPO | Admitting: Gastroenterology

## 2018-08-17 ENCOUNTER — Other Ambulatory Visit (INDEPENDENT_AMBULATORY_CARE_PROVIDER_SITE_OTHER): Payer: PPO

## 2018-08-17 ENCOUNTER — Encounter: Payer: Self-pay | Admitting: Gastroenterology

## 2018-08-17 ENCOUNTER — Ambulatory Visit: Payer: PPO | Admitting: Gastroenterology

## 2018-08-17 ENCOUNTER — Ambulatory Visit (INDEPENDENT_AMBULATORY_CARE_PROVIDER_SITE_OTHER): Payer: PPO | Admitting: Gastroenterology

## 2018-08-17 VITALS — BP 160/80 | HR 96 | Ht 67.5 in | Wt 225.2 lb

## 2018-08-17 DIAGNOSIS — D649 Anemia, unspecified: Secondary | ICD-10-CM

## 2018-08-17 DIAGNOSIS — K22719 Barrett's esophagus with dysplasia, unspecified: Secondary | ICD-10-CM | POA: Diagnosis not present

## 2018-08-17 DIAGNOSIS — D126 Benign neoplasm of colon, unspecified: Secondary | ICD-10-CM | POA: Diagnosis not present

## 2018-08-17 DIAGNOSIS — D509 Iron deficiency anemia, unspecified: Secondary | ICD-10-CM | POA: Diagnosis not present

## 2018-08-17 LAB — CBC WITH DIFFERENTIAL/PLATELET
Basophils Absolute: 0 10*3/uL (ref 0.0–0.1)
Basophils Relative: 0.9 % (ref 0.0–3.0)
Eosinophils Absolute: 0.2 10*3/uL (ref 0.0–0.7)
Eosinophils Relative: 4.3 % (ref 0.0–5.0)
HCT: 35.6 % — ABNORMAL LOW (ref 39.0–52.0)
HEMOGLOBIN: 11.3 g/dL — AB (ref 13.0–17.0)
Lymphocytes Relative: 25.8 % (ref 12.0–46.0)
Lymphs Abs: 1.1 10*3/uL (ref 0.7–4.0)
MCHC: 31.8 g/dL (ref 30.0–36.0)
MCV: 87.9 fl (ref 78.0–100.0)
MONO ABS: 0.5 10*3/uL (ref 0.1–1.0)
Monocytes Relative: 12.9 % — ABNORMAL HIGH (ref 3.0–12.0)
Neutro Abs: 2.3 10*3/uL (ref 1.4–7.7)
Neutrophils Relative %: 56.1 % (ref 43.0–77.0)
Platelets: 207 10*3/uL (ref 150.0–400.0)
RBC: 4.05 Mil/uL — ABNORMAL LOW (ref 4.22–5.81)
RDW: 29.3 % — ABNORMAL HIGH (ref 11.5–15.5)
WBC: 4.2 10*3/uL (ref 4.0–10.5)

## 2018-08-17 MED ORDER — OMEPRAZOLE 20 MG PO CPDR: 20.0000 mg | DELAYED_RELEASE_CAPSULE | Freq: Every day | ORAL | 3 refills | Status: DC

## 2018-08-17 NOTE — Progress Notes (Signed)
HPI :  78 year old male with history of Barrett's esophagus and multiple colonic adenomas, iron deficiency anemia, recurrent C Diff, here for follow-up visit. He has a history of pulmonary embolism on Xarelto therapy.   I have not seen the patient since 10/2016. He was unfortunately admitted to the hospital 07/10/18 for symptomatic anemia, presenting Hgb of 5.6 along with trace heme positive stools, MCV 78. He had evidence of iron deficiency on labs in 2019 but never came to our office. He continues to take his Xarelto and denied any overt bleeding leading up to his hospitalization. He received a few units of PRBC transfusion to which he responded appropriately. Dr. Ardis Hughs took care of him at the hospital. EGD showed a 4cm segment of nondysplastic Barrett's and a hiatal hernia, but no cause for anemia. Colonoscopy showed mild radiation proctitis (not treated given absence of symptoms), residual 1.5cm polyp from remote EMR (Dr. Arsenio Loader at [facility]) which was removed and c/w TVA. Dr. Ardis Hughs recommended consideration for 3 year follow up.  Since that time he's continued to do well. CBC repeated today, Hgb 11.3 today (up from 9.1 on 1/18). He is taking ferrous sulfate once a day and tolerating it well. He denies any black stools. He denies any problems with his bowels in general. He has continued to take Pepcid for history of Barrett's. Initially placed on this in light of recurrent C. Difficile. He has not had issues with that for a few years now. We discussed options moving forward  Iron deficiency noted in May 2019 - ferritin of 11, iron 15, iron sat 4%  Endoscopic history: EGD 04/25/2015 - Evidence of Barrett's esophagus was noted at the distal esophagus (C2M3) - no dysplasia, 7cm hiatal hernia, multiple benign gastric polyps. Recall 04/2018 Colonoscopy 04/25/2015 - severe diverticulosis, 3-4cm polyp in ascending colon not removed, another 1cm polyp removed, 42mm sigmoid polyp, radiation proctitis s/p APC.  Polyps c/w adenoma. Referred to Dr. Arsenio Loader for polypectomy Colonoscopy March and Sept 2017 at [facility]  EGD 07/11/18 - Prague classification [id number] Barrett's esophagus was noted in the distal esophagus. The EG landmarks were not clear given the large hiatal hernia however it looks like the EG junction is at 32cm from the incisors and the proximal edge of the Barrett's mucosa is at 36cm from the incisors. No cameron lesions Colonoscopy 07/11/18 - 1.5cm area of residual polyp, removed piecemeal via EMR, radiation proctitis, mild   Past Medical History:  Diagnosis Date  . AAA (abdominal aortic aneurysm) ([place]) followed by dr Stanford Breed   last duplex 07-17-2017  3.2 x 3.3  . Anticoagulant long-term use    xarelto for hx PEs and DVT  . Arthritis    back  . Barrett's esophagus 2009  . Bilateral lower extremity edema    chronic pedal  . CAD (coronary artery disease) cardioloigst--  dr Stanford Breed   cardiac cath 04-12-2014  single vessel obstructive cad involving ostrium D2, and other nonobstructive cad , normall lvf  . Chronic lower back pain   . 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[place])    per duplex 07-17-2017  left cia 2 x 2, right cia 2.1 x 2  . Diverticulosis   . Dyspnea    with exertion   . Dyspnea on exertion   . FH: colonic polyps   . GERD (gastroesophageal reflux disease)   . Gout    08-27-2017  per pt stable  . History of adenomatous polyp of colon   . History of DVT of lower extremity  10/ 2015  LLE  . History of esophageal stricture    post dilation  . History of gastric polyp    benign  . History of pulmonary embolus (PE)    2005  treated 1 yr w/ coumadin and 10/ 2015  bilateral  . History of radiation therapy 06/22/2012-08/18/2012   78 Gy to prostate (external beam)  . History of subdural hematoma 03/2012   s/p  evacuation  . Hypertension   . Lesion of bladder    non canerous  . Prostate cancer [facility]) urologist-  dr wrenn/ oncologist- dr Sondra Come   dx 07/ 2013--  Stage T1c, G[place] 7, vol  67cc--- completed external beam radiation therapy 08-18-2012  . Pulmonary HTN ([place]) 04/12/2014   last echo 05-01-2017  PASP 97mmHg  . RBBB (right bundle branch block with left anterior fascicular block)    ocassional     Past Surgical History:  Procedure Laterality Date  . BIOPSY  07/11/2018   Procedure: BIOPSY;  Surgeon: Milus Banister, MD;  Location: [facility];  Service: Endoscopy;;  . CATARACT EXTRACTION W/ INTRAOCULAR LENS  IMPLANT, BILATERAL  2018  . COLONOSCOPY WITH PROPOFOL N/A 04/25/2015   Procedure: COLONOSCOPY WITH PROPOFOL;  Surgeon: Manus Gunning, MD;  Location: [facility];  Service: Gastroenterology;  Laterality: N/A;  . COLONOSCOPY WITH PROPOFOL N/A 07/11/2018   Procedure: COLONOSCOPY WITH PROPOFOL;  Surgeon: Milus Banister, MD;  Location: [facility];  Service: Endoscopy;  Laterality: N/A;  . CRANIOTOMY  04/03/2012   Procedure: CRANIOTOMY HEMATOMA EVACUATION SUBDURAL;  Surgeon: Erline Levine, MD;  Location: [facility];  Service: Neurosurgery;  Laterality: Left;  Left Craniotomy for Evacuation of Subdural Hematoma  . CYSTOSCOPY WITH BIOPSY N/A 09/02/2017   Procedure: CYSTOSCOPY WITH BIOPSY AND FULGURATION POSSIBLE TRANSURETHRAL RESECTION OF BLADDER TUMOR;  Surgeon: [name], MD;  Location[facility];  Service: Urology;  Laterality: N/A;  . ESOPHAGOGASTRODUODENOSCOPY (EGD) WITH PROPOFOL N/A 04/25/2015   Procedure: ESOPHAGOGASTRODUODENOSCOPY (EGD) WITH PROPOFOL;  Surgeon: Manus Gunning, MD;  Location: [facility];  Service: Gastroenterology;  Laterality: N/A;  . ESOPHAGOGASTRODUODENOSCOPY (EGD) WITH PROPOFOL N/A 07/11/2018   Procedure: ESOPHAGOGASTRODUODENOSCOPY (EGD) WITH PROPOFOL;  Surgeon: Milus Banister, MD;  Location: [facility];  Service: Endoscopy;  Laterality: N/A;  . IRRIGATION AND DEBRIDEMENT SEBACEOUS CYST  01-11-13   "off my back"  . KYPHOPLASTY N/A 12/23/2017   Procedure: Lumbar One KYPHOPLASTY;  Surgeon: Erline Levine,  MD;  Location: [facility];  Service: Neurosurgery;  Laterality: N/A;  Lumbar One KYPHOPLASTY  . LEFT AND RIGHT HEART CATHETERIZATION WITH CORONARY ANGIOGRAM N/A 04/12/2014   Procedure: LEFT AND RIGHT HEART CATHETERIZATION WITH CORONARY ANGIOGRAM;  Surgeon: Peter M Martinique, MD;  Location: [facility] CATH LAB;  Service: Cardiovascular;  Laterality: N/A;  . ORIF FEMUR FRACTURE Left 10-02-2010   dr Alvan Dame   subtrochanteric  . POLYPECTOMY  07/11/2018   Procedure: POLYPECTOMY;  Surgeon: Milus Banister, MD;  Location: [facility];  Service: Endoscopy;;  . TONSILLECTOMY  1940's  . TRANSTHORACIC ECHOCARDIOGRAM  05/01/2017   mild concentric LVH, ef 27-78%, grade 1 diastolic dysfunction/ trivial AR/ mild dilated ascending aorta/ mild to moderate MR (no stenosis)/ mild LAE/ mild TR/ mild to moderate increase PASP 25mmHg   Family History  Problem Relation Age of Onset  . Macular degeneration Mother   . Osteoarthritis Mother   . Aneurysm Father        d/o 37 yo, ruptured AAA  . Heart disease Other  No family history  . Colon cancer Neg Hx   . Colon polyps Neg Hx   . Diabetes Neg Hx    Social History   Tobacco Use  . Smoking status: Former Smoker    Packs/day: 1.00    Years: 20.00    Pack years: 20.00    Types: Cigarettes    Last attempt to quit: 12/23/1971    Years since quitting: 46.6  . Smokeless tobacco: Never Used  Substance Use Topics  . Alcohol use: Yes    Alcohol/week: 14.0 standard drinks    Types: 14 Cans of beer per week    Comment: 2 beer daily  . Drug use: No   Current Outpatient Medications  Medication Sig Dispense Refill  . allopurinol (ZYLOPRIM) 300 MG tablet Take 300 mg by mouth every morning.     . celecoxib (CELEBREX) 200 MG capsule Take 20,400 mg by mouth as needed.    . famotidine (PEPCID) 20 MG tablet Take 20 mg by mouth at bedtime.    Marland Kitchen HYDROcodone-acetaminophen (NORCO/VICODIN) 5-325 MG tablet Take 1 tablet by mouth 2 (two) times daily.    Marland Kitchen olmesartan (BENICAR) 20 MG  tablet Take 0.5 tablets (10 mg total) by mouth daily. 15 tablet 3  . XARELTO 20 MG TABS tablet TAKE 1 TABLET BY MOUTH EVERY DAY WITH SUPPER (Patient taking differently: Take 20 mg by mouth daily with supper. ) 90 tablet 1  . ferrous sulfate 325 (65 FE) MG tablet Take 1 tablet (325 mg total) by mouth daily for 30 days. 30 tablet 0  . furosemide (LASIX) 20 MG tablet Take 1 tablet (20 mg total) by mouth daily for 30 days. 30 tablet 0   No current facility-administered medications for this visit.    No Known Allergies   Review of Systems: All systems reviewed and negative except where noted in HPI.   Lab Results  Component Value Date   WBC 4.2 08/17/2018   HGB 11.3 (L) 08/17/2018   HCT 35.6 (L) 08/17/2018   MCV 87.9 08/17/2018   PLT 207.0 08/17/2018    Lab Results  Component Value Date   CREATININE 1.08 07/17/2018   BUN 31 (H) 07/17/2018   NA 139 07/17/2018   K 4.8 07/17/2018   CL 102 07/17/2018   CO2 20 07/17/2018    Lab Results  Component Value Date   ALT 7 07/17/2018   AST 9 07/17/2018   ALKPHOS 69 07/17/2018   BILITOT 0.3 07/17/2018    CBC Latest Ref Rng & Units 08/17/2018 07/11/2018 07/10/2018  WBC 4.0 - 10.5 K/uL 4.2 4.5 -  Hemoglobin 13.0 - 17.0 g/dL 11.3(L) 9.1(L) 8.8(L)  Hematocrit 39.0 - 52.0 % 35.6(L) 30.9(L) 29.5(L)  Platelets 150.0 - 400.0 K/uL 207.0 249 -     Physical Exam: BP (!) 160/80 (BP Location: Left Arm, Patient Position: Sitting, Cuff Size: Normal)   Pulse 96   Ht 5' 7.5" (1.715 m)   Wt 225 lb 4 oz (102.2 kg)   BMI 34.76 kg/m  Constitutional: Pleasant,well-developed, male in no acute distress. HEENT: Normocephalic and atraumatic. Conjunctivae are normal. No scleral icterus. Neck supple.  Cardiovascular: Normal rate, regular rhythm.  Pulmonary/chest: Effort normal and breath sounds normal. No wheezing, rales or rhonchi. Abdominal: Soft, nondistended, nontender.  There are no masses palpable. No hepatomegaly. Extremities: (+) 1 edema  BL Lymphadenopathy: No cervical adenopathy noted. Neurological: Alert and oriented to person place and time. Skin: Skin is warm and dry. No rashes noted. Psychiatric: Normal mood  and affect. Behavior is normal.   ASSESSMENT AND PLAN: 78 year old male here for reassessment of the following issues:  Iron deficiency anemia - admitted with symptomatic anemia in January as above, EGD and colonoscopy as outlined. Unclear what has caused this process in the setting of chronic anticoagulation. He is not having any symptoms whatsoever relation to radiation proctitis. Fortunately he's done quite well on oral iron and his hemoglobin continues to rise. Discussed options with him to include close observation with continuing oral iron versus further workup with capsule endoscopy. I think capsule endoscopy is reasonable to assess his small bowel given his ongoing need for anticoagulation and the fact that he has required blood transfusion on a few occasions for this over the past 2 years, and it is a low risk exam, and to ensure no small bowel malignancy. He would require some prep for this exam, he is really hoping to avoid that if possible. After discussion with him and his wife he wants to think about the capsule, but verbalized understanding of risks/benefits of doing it and risks/benefits of not doing it. Otherwise repeat CBC in 6-8 weeks to ensure continued uptrend. If Hgb downtrends would consider IV iron. He agreed. No NSAIDs.  Barrett's esophagus - stable 4cm segment, nondysplastic. standard of care for medical management is with PPI. He had been off that for 2 years due to C. Difficile in 2018 time frame. At this point time he's had no further recurrence. In discussion of risks and benefits of PPI versus H2 blockers, he will resume low-dose omeprazole at 20 g once a day and can stop the Pepcid at this time. If he has recurrence of C. Difficile will have to reassess the regimen again. He agreed.  Colon  adenoma - prior EMR of large right sided colonic polyp at [facility], had a 1.5cm area of recurrence removed by Dr. Ardis Hughs, he is confident this has been entirely removed. Will discuss repeat colonoscopy with him in 3 years pending his course and comorbidities.  [place] Cellar, MD [facility] Gastroenterology

## 2018-08-17 NOTE — Patient Instructions (Addendum)
If you are age 79 or older, your body mass index should be between 23-30. Your Body mass index is 34.76 kg/m. If this is out of the aforementioned range listed, please consider follow up with your Primary Care Provider.  If you are age 23 or younger, your body mass index should be between 19-25. Your Body mass index is 34.76 kg/m. If this is out of the aformentioned range listed, please consider follow up with your Primary Care Provider.   We have sent the following medications to your pharmacy for you to pick up at your convenience: Omeprazole 20 mg: Take once daily  Please discontinue Pepcid.  You will be due for a blood work (a CBC) in 6 to 8 weeks. We will let you know when it is time to go to the lab.  Thank you for entrusting me with your care and for choosing [facility], Dr. [place] Cellar

## 2018-08-21 ENCOUNTER — Ambulatory Visit: Payer: PPO | Admitting: Cardiology

## 2018-08-21 ENCOUNTER — Encounter: Payer: Self-pay | Admitting: Cardiology

## 2018-08-21 VITALS — BP 128/76 | HR 96 | Ht 72.0 in | Wt 223.0 lb

## 2018-08-21 DIAGNOSIS — I2510 Atherosclerotic heart disease of native coronary artery without angina pectoris: Secondary | ICD-10-CM

## 2018-08-21 DIAGNOSIS — I714 Abdominal aortic aneurysm, without rupture, unspecified: Secondary | ICD-10-CM

## 2018-08-21 DIAGNOSIS — I10 Essential (primary) hypertension: Secondary | ICD-10-CM

## 2018-08-21 DIAGNOSIS — D649 Anemia, unspecified: Secondary | ICD-10-CM | POA: Diagnosis not present

## 2018-08-21 DIAGNOSIS — R0609 Other forms of dyspnea: Secondary | ICD-10-CM | POA: Diagnosis not present

## 2018-08-21 MED ORDER — FUROSEMIDE 20 MG PO TABS: 20.0000 mg | ORAL_TABLET | Freq: Every day | ORAL | 3 refills | Status: DC

## 2018-08-21 NOTE — Patient Instructions (Signed)
Medication Instructions:  NO CHANGE If you need a refill on your cardiac medications before your next appointment, please call your pharmacy.   Lab work: If you have labs (blood work) drawn today and your tests are completely normal, you will receive your results only by: Marland Kitchen MyChart Message (if you have MyChart) OR . A paper copy in the mail If you have any lab test that is abnormal or we need to change your treatment, we will call you to review the results.  Follow-Up: At [facility], you and your health needs are our priority.  As part of our continuing mission to provide you with exceptional heart care, we have created designated Provider Care Teams.  These Care Teams include your primary Cardiologist (physician) and Advanced Practice Providers (APPs -  Physician Assistants and Nurse Practitioners) who all work together to provide you with the care you need, when you need it. You will need a follow up appointment in 6 months.  Please call our office 2 months in advance to schedule this appointment.  You may see Kirk Ruths, MD or one of the following Advanced Practice Providers on your designated Care Team:   Kerin Ransom, PA-C Roby Lofts, [place] . Sande Rives, PA-C  CALL IN June TO SCHEDULE APPOINTMENT IN [place]

## 2018-08-28 ENCOUNTER — Telehealth: Payer: Self-pay

## 2018-08-28 ENCOUNTER — Other Ambulatory Visit: Payer: Self-pay

## 2018-08-28 DIAGNOSIS — D509 Iron deficiency anemia, unspecified: Secondary | ICD-10-CM

## 2018-08-28 NOTE — Telephone Encounter (Signed)
See Patient message 08/24/18 for other details.  Spoke with the patient. Advised of the recommendations as per Dr Havery Moros about his anemia and a capsule endoscopy. Questions answered. Procedure explained. Patient requests to be scheduled for a capsule endoscopy. Appointment will be 09/14/18 with him arriving at 8:00 am. Explained not to arrive before 8:00 am. Reviewed instructions. Written instructions mailed. Order form put in Dr Doyne Keel office for completion and signing.Referral sent.

## 2018-09-04 ENCOUNTER — Telehealth: Payer: Self-pay | Admitting: Gastroenterology

## 2018-09-04 NOTE — Telephone Encounter (Signed)
Spoke with the patient. He had questions he needed clarified about the instructions for the capsule endoscopy prep.

## 2018-09-04 NOTE — Telephone Encounter (Signed)
Pt called has several question about the instructions for his upcoming procedure 09-14-18

## 2018-09-08 ENCOUNTER — Other Ambulatory Visit: Payer: Self-pay | Admitting: Cardiology

## 2018-09-08 DIAGNOSIS — M4015 Other secondary kyphosis, thoracolumbar region: Secondary | ICD-10-CM | POA: Diagnosis not present

## 2018-09-10 ENCOUNTER — Telehealth: Payer: PPO | Admitting: Family

## 2018-09-10 DIAGNOSIS — R05 Cough: Secondary | ICD-10-CM

## 2018-09-10 DIAGNOSIS — R059 Cough, unspecified: Secondary | ICD-10-CM

## 2018-09-10 MED ORDER — ALBUTEROL SULFATE HFA 108 (90 BASE) MCG/ACT IN AERS: 2.0000 | INHALATION_SPRAY | Freq: Four times a day (QID) | RESPIRATORY_TRACT | 0 refills | Status: DC | PRN

## 2018-09-10 MED ORDER — BENZONATATE 100 MG PO CAPS: 100.0000 mg | ORAL_CAPSULE | Freq: Three times a day (TID) | ORAL | 0 refills | Status: DC | PRN

## 2018-09-10 NOTE — Progress Notes (Signed)
Greater than 5 minutes, yet less than 10 minutes of time have been spent researching, coordinating, and implementing care for this patient today.  Thank you for the details you included in the comment boxes. Those details are very helpful in determining the best course of treatment for you and help [place] to provide the best care.  As far as the leg swelling, this is something that we must examine in person at some point in time; if you have severe pain or loss of sensation with this welling, please be seen immediately face-to-face. You indicated 2 years. You should ideally set up an outpatient visit for that. Given your age, please use the precautions below.   For the respiratory, see plan below.  E-Visit for Corona Virus Screening Based on your current symptoms, it seems unlikely that your symptoms are related to the [place] virus.   Coronavirus disease 2019 (COVID-19) is a respiratory illness that can spread from person to person. The virus that causes COVID-19 is a new virus that was first identified in the country of [place] but is now found in multiple other countries and has spread to the [place].  Symptoms associated with the virus are mild to severe fever, cough, and shortness of breath. There is currently no vaccine to protect against COVID-19, and there is no specific antiviral treatment for the virus.  It is vitally important that if you feel that you have an infection such as this virus or any other virus that you stay home and away from places where you may spread it to others.  Currently, not all patients are being tested. If the symptoms are mild and there is not a known exposure, performing the test is not indicated.  You can use medication such as A prescription cough medication called Tessalon Perles 100 mg. You may take 1-2 capsules every 8 hours as needed for cough and A prescription inhaler called Albuterol MDI 90 mcg /actuation 2 puffs every 4 hours as needed for shortness of  breath, wheezing, cough  Reduce your risk of any infection by using the same precautions used for avoiding the common cold or flu:  Marland Kitchen Wash your hands often with soap and warm water for at least 20 seconds.  If soap and water are not readily available, use an alcohol-based hand sanitizer with at least 60% alcohol.  . If coughing or sneezing, cover your mouth and nose by coughing or sneezing into the elbow areas of your shirt or coat, into a tissue or into your sleeve (not your hands). . Avoid shaking hands with others and consider head nods or verbal greetings only. . Avoid touching your eyes, nose, or mouth with unwashed hands.  . Avoid close contact with people who are sick. . Avoid places or events with large numbers of people in one location, like concerts or sporting events. . Carefully consider travel plans you have or are making. . If you are planning any travel outside or inside the [place], visit the CDC's Travelers' Health webpage for the latest health notices. . If you have some symptoms but not all symptoms, continue to monitor at home and seek medical attention if your symptoms worsen. . If you are having a medical emergency, call 911.  HOME CARE . Only take medications as instructed by your medical team. . Drink plenty of fluids and get plenty of rest. . A steam or ultrasonic humidifier can help if you have congestion.   GET HELP RIGHT AWAY IF: . You  develop worsening fever. . You become short of breath . You cough up blood. . Your symptoms become more severe MAKE SURE YOU   Understand these instructions.  Will watch your condition.  Will get help right away if you are not doing well or get worse.  Your e-visit answers were reviewed by a board certified advanced clinical practitioner to complete your personal care plan.  Depending on the condition, your plan could have included both over the counter or prescription medications.  If there is a problem please reply once you have  received a response from your provider. Your safety is important to [place].  If you have drug allergies check your prescription carefully.    You can use MyChart to ask questions about today's visit, request a non-urgent call back, or ask for a work or school excuse for 24 hours related to this e-Visit. If it has been greater than 24 hours you will need to follow up with your provider, or enter a new e-Visit to address those concerns. You will get an e-mail in the next two days asking about your experience.  I hope that your e-visit has been valuable and will speed your recovery. Thank you for using e-visits.

## 2018-09-11 ENCOUNTER — Telehealth: Payer: Self-pay | Admitting: Gastroenterology

## 2018-09-11 NOTE — Telephone Encounter (Signed)
Patient notified that we need to reschedule until after the Covid-crisis.  He is rescheduled for now to 10/19/18

## 2018-09-11 NOTE — Telephone Encounter (Signed)
Pt called stating that he was returning your call, pls call him again.  °

## 2018-09-30 ENCOUNTER — Encounter: Payer: Self-pay | Admitting: Cardiology

## 2018-09-30 DIAGNOSIS — E875 Hyperkalemia: Secondary | ICD-10-CM | POA: Diagnosis not present

## 2018-09-30 DIAGNOSIS — C7951 Secondary malignant neoplasm of bone: Secondary | ICD-10-CM | POA: Diagnosis not present

## 2018-09-30 DIAGNOSIS — Z923 Personal history of irradiation: Secondary | ICD-10-CM | POA: Diagnosis not present

## 2018-09-30 DIAGNOSIS — C530 Malignant neoplasm of endocervix: Secondary | ICD-10-CM | POA: Diagnosis not present

## 2018-09-30 DIAGNOSIS — Z9221 Personal history of antineoplastic chemotherapy: Secondary | ICD-10-CM | POA: Diagnosis not present

## 2018-09-30 DIAGNOSIS — D509 Iron deficiency anemia, unspecified: Secondary | ICD-10-CM | POA: Diagnosis not present

## 2018-10-05 ENCOUNTER — Other Ambulatory Visit: Payer: Self-pay

## 2018-10-05 ENCOUNTER — Telehealth: Payer: Self-pay | Admitting: Gastroenterology

## 2018-10-05 DIAGNOSIS — D509 Iron deficiency anemia, unspecified: Secondary | ICD-10-CM

## 2018-10-05 NOTE — Progress Notes (Signed)
Cbc due in June

## 2018-10-05 NOTE — Telephone Encounter (Signed)
Labs returned as outlined:  CBC 09/30/18 - Hgb 12.7, HCV 38.8, MCV 94.4, plt 154, WBC 4.1  Overall Hgb improved on oral iron / omeprazole since I have last seen him. We are awaiting capsule endoscopy to be done, I think he is tentatively scheduled at the end of this month. Otherwise continue meds and repeat CBC in 2 months.  Patient will be sent a mychart message letting him know results and recommendations moving forward.   Jan can you please place a recall for CBC in 2 months. Thanks

## 2018-10-14 ENCOUNTER — Encounter: Payer: Self-pay | Admitting: *Deleted

## 2018-11-03 ENCOUNTER — Other Ambulatory Visit: Payer: Self-pay

## 2018-11-03 MED ORDER — OLMESARTAN MEDOXOMIL 20 MG PO TABS: 10.0000 mg | ORAL_TABLET | Freq: Every day | ORAL | 3 refills | Status: DC

## 2018-11-04 ENCOUNTER — Other Ambulatory Visit: Payer: Self-pay

## 2018-11-04 MED ORDER — OLMESARTAN MEDOXOMIL 20 MG PO TABS: 10.0000 mg | ORAL_TABLET | Freq: Every day | ORAL | 3 refills | Status: DC

## 2018-11-05 ENCOUNTER — Telehealth: Payer: Self-pay

## 2018-11-05 NOTE — Telephone Encounter (Signed)
-----   Message from Roetta Sessions, [place] sent at 09/30/2018  1:41 PM EDT ----- Regarding: lab work in May  ----- Message ----- From: Yetta Flock, MD Sent: 09/30/2018  12:06 PM EDT To: Roetta Sessions, CMA Subject: RE: lab work now or push out 30 days?          I think May is okay. Thanks ----- Message ----- From: Roetta Sessions, CMA Sent: 09/30/2018  11:16 AM EDT To: Yetta Flock, MD Subject: FW: lab work now or push out 30 days?          I hope you don't mind me sending you all these but I imagine that there are many pts that should not go to the lab right now to protect themselves but there may be situations where you want them to go even with the risk for close monitoring of their condition. Please advise if you want [name] to go to the lab for his CBC in April or can he wait until May? Thanks, Jan   ----- Message ----- From: Roetta Sessions, CMA Sent: 09/28/2018 To: Roetta Sessions, CMA Subject: lab work                                       Pt due for cbc the week of 4-13. Order is in

## 2018-11-05 NOTE — Telephone Encounter (Signed)
Called and spoke to pt.  He is going to Dr. Pennie Banter office for labs and will have them send the lab results to [place].

## 2018-11-18 ENCOUNTER — Telehealth: Payer: Self-pay

## 2018-11-18 NOTE — Telephone Encounter (Signed)
Okay. If he does not want to proceed I understand, but would like to see him back in 1-2 months for reassessment in the clinic to discuss it further. Thanks

## 2018-11-18 NOTE — Telephone Encounter (Signed)
Patient sent by My Chart that he wanted to cancel his capsule endoscopy for 11/23/18. I called and spoke to wife and she confirmed that patient did want to cancel until after his visit with Dr. Shelia Media and blood work, then he would decide if he still wanted it done

## 2018-11-23 ENCOUNTER — Telehealth: Payer: Self-pay

## 2018-11-23 NOTE — Telephone Encounter (Signed)
Called and spoke to pt.  He is getting labs done at Dr. Pennie Banter office this Wednesday or next.  He will make sure we get a copy of the results.

## 2018-11-23 NOTE — Telephone Encounter (Signed)
-----   Message from Roetta Sessions, [place] sent at 11/05/2018  1:46 PM EDT ----- Regarding: labs See if pt had CBC at dr. Pennie Banter.  If so let [place] know.

## 2018-11-30 ENCOUNTER — Telehealth: Payer: Self-pay

## 2018-11-30 NOTE — Telephone Encounter (Signed)
Sent letter to pt to go to the lab for cbc

## 2018-11-30 NOTE — Telephone Encounter (Signed)
-----   Message from Roetta Sessions, [place] sent at 10/05/2018  3:29 PM EDT ----- Regarding: labs in June Labs due in June :cbc. Order is in

## 2018-12-01 ENCOUNTER — Encounter: Payer: Self-pay | Admitting: Gastroenterology

## 2018-12-01 DIAGNOSIS — D509 Iron deficiency anemia, unspecified: Secondary | ICD-10-CM | POA: Diagnosis not present

## 2018-12-07 NOTE — Telephone Encounter (Signed)
Labs returned  Done on 12/01/18 - Hgb of 12.8, MCV 98.5  Stable from last draw in April. Would recommend a repeat CBC in 2 months or so.  He should be taking omeprazole based on my last visit with him, can you confirm he is taking it. I had previously recommended capsule endoscopy to evaluate his small bowel and he declined. He can see me in a few months for follow up if he continues to decline the capsule. Thanks

## 2018-12-07 NOTE — Telephone Encounter (Signed)
Patient called said that he received a letter saying that it was time to have labs done but said that he has already had them done with Dr. Pennie Banter he would like to know if we have received them already. Pt CB # [id number]

## 2018-12-07 NOTE — Telephone Encounter (Signed)
Called pt and let him know we rec'd labs today.  He would like to know what Dr. Havery Moros thinks.  Placed on Dr. Mickey Farber desk for review.

## 2018-12-08 NOTE — Telephone Encounter (Signed)
Called pt three times today.  Phone is busy.

## 2018-12-09 NOTE — Telephone Encounter (Signed)
Called pt. Line was busy repeatedly.  Mailed letter with results and recommendations to patient. Placed reminder for CBC in Early Sept.

## 2018-12-21 DIAGNOSIS — M545 Low back pain: Secondary | ICD-10-CM | POA: Diagnosis not present

## 2018-12-21 DIAGNOSIS — M4015 Other secondary kyphosis, thoracolumbar region: Secondary | ICD-10-CM | POA: Diagnosis not present

## 2018-12-31 ENCOUNTER — Other Ambulatory Visit: Payer: Self-pay

## 2018-12-31 ENCOUNTER — Other Ambulatory Visit: Payer: Self-pay | Admitting: Cardiology

## 2018-12-31 ENCOUNTER — Ambulatory Visit (HOSPITAL_COMMUNITY)
Admission: RE | Admit: 2018-12-31 | Discharge: 2018-12-31 | Disposition: A | Discharge status: Home / Self Care | Payer: PPO | Source: Ambulatory Visit | Attending: Cardiology | Admitting: Cardiology

## 2018-12-31 DIAGNOSIS — I714 Abdominal aortic aneurysm, without rupture, unspecified: Secondary | ICD-10-CM

## 2019-01-01 ENCOUNTER — Other Ambulatory Visit: Payer: Self-pay | Admitting: *Deleted

## 2019-01-01 DIAGNOSIS — I714 Abdominal aortic aneurysm, without rupture, unspecified: Secondary | ICD-10-CM

## 2019-01-04 ENCOUNTER — Telehealth: Payer: Self-pay

## 2019-01-04 MED ORDER — OLMESARTAN MEDOXOMIL 5 MG PO TABS: 10.0000 mg | ORAL_TABLET | Freq: Every day | ORAL | 2 refills | Status: DC

## 2019-01-04 NOTE — Telephone Encounter (Signed)
Spoke with wife.  Patient BP doing well, mostly [phone] systolic. Explained to wife challenges of other ARBs and supply.  She was agreeable to 2 x 5 mg tabs daily.  Will send rx to [facility].

## 2019-01-04 NOTE — Telephone Encounter (Signed)
Per [employer] email please forgive for using my log in for [name]: He is out of his BP medication and I would like to change it. He is currently taking Olmesartan Medoxomil which I have to cut in half. Frequently the tablet crushes when I try to cut in in two therefore I cannot get a refill when I need it. I even bought a new pill spltter but it does not work any better. He has tried Amlodipine which he says he cannot tolerate. Would you please call in a new RX for him at [facility] @ [place]. He was on Lisonipril which was O K but Dr. Shelia Media said his Sodium was too high and changed it to the Amlodipine/ thank you, Idelia Salm

## 2019-01-28 DIAGNOSIS — Z8546 Personal history of malignant neoplasm of prostate: Secondary | ICD-10-CM | POA: Diagnosis not present

## 2019-02-03 DIAGNOSIS — Z961 Presence of intraocular lens: Secondary | ICD-10-CM | POA: Diagnosis not present

## 2019-02-03 DIAGNOSIS — H52203 Unspecified astigmatism, bilateral: Secondary | ICD-10-CM | POA: Diagnosis not present

## 2019-02-03 DIAGNOSIS — H524 Presbyopia: Secondary | ICD-10-CM | POA: Diagnosis not present

## 2019-02-04 DIAGNOSIS — Z8546 Personal history of malignant neoplasm of prostate: Secondary | ICD-10-CM | POA: Diagnosis not present

## 2019-02-04 DIAGNOSIS — R3912 Poor urinary stream: Secondary | ICD-10-CM | POA: Diagnosis not present

## 2019-02-04 DIAGNOSIS — N3040 Irradiation cystitis without hematuria: Secondary | ICD-10-CM | POA: Diagnosis not present

## 2019-02-08 DIAGNOSIS — Z125 Encounter for screening for malignant neoplasm of prostate: Secondary | ICD-10-CM | POA: Diagnosis not present

## 2019-02-08 DIAGNOSIS — R5383 Other fatigue: Secondary | ICD-10-CM | POA: Diagnosis not present

## 2019-02-08 DIAGNOSIS — D649 Anemia, unspecified: Secondary | ICD-10-CM | POA: Diagnosis not present

## 2019-02-08 DIAGNOSIS — I10 Essential (primary) hypertension: Secondary | ICD-10-CM | POA: Diagnosis not present

## 2019-02-08 DIAGNOSIS — D509 Iron deficiency anemia, unspecified: Secondary | ICD-10-CM | POA: Diagnosis not present

## 2019-02-09 ENCOUNTER — Ambulatory Visit: Payer: PPO | Admitting: Gastroenterology

## 2019-02-10 DIAGNOSIS — I714 Abdominal aortic aneurysm, without rupture: Secondary | ICD-10-CM | POA: Diagnosis not present

## 2019-02-10 DIAGNOSIS — I10 Essential (primary) hypertension: Secondary | ICD-10-CM | POA: Diagnosis not present

## 2019-02-10 DIAGNOSIS — D72819 Decreased white blood cell count, unspecified: Secondary | ICD-10-CM | POA: Diagnosis not present

## 2019-02-10 DIAGNOSIS — D649 Anemia, unspecified: Secondary | ICD-10-CM | POA: Diagnosis not present

## 2019-02-10 DIAGNOSIS — I2510 Atherosclerotic heart disease of native coronary artery without angina pectoris: Secondary | ICD-10-CM | POA: Diagnosis not present

## 2019-02-10 DIAGNOSIS — M1A9XX0 Chronic gout, unspecified, without tophus (tophi): Secondary | ICD-10-CM | POA: Diagnosis not present

## 2019-02-10 DIAGNOSIS — Z23 Encounter for immunization: Secondary | ICD-10-CM | POA: Diagnosis not present

## 2019-02-10 DIAGNOSIS — Z0001 Encounter for general adult medical examination with abnormal findings: Secondary | ICD-10-CM | POA: Diagnosis not present

## 2019-02-10 DIAGNOSIS — R600 Localized edema: Secondary | ICD-10-CM | POA: Diagnosis not present

## 2019-02-10 DIAGNOSIS — M545 Low back pain: Secondary | ICD-10-CM | POA: Diagnosis not present

## 2019-02-10 DIAGNOSIS — R5383 Other fatigue: Secondary | ICD-10-CM | POA: Diagnosis not present

## 2019-02-10 DIAGNOSIS — C61 Malignant neoplasm of prostate: Secondary | ICD-10-CM | POA: Diagnosis not present

## 2019-02-15 ENCOUNTER — Encounter: Payer: Self-pay | Admitting: Gastroenterology

## 2019-02-15 ENCOUNTER — Ambulatory Visit: Payer: PPO | Admitting: Gastroenterology

## 2019-02-15 VITALS — BP 164/90 | HR 92 | Temp 98.3°F | Ht 67.5 in | Wt 226.0 lb

## 2019-02-15 DIAGNOSIS — K2270 Barrett's esophagus without dysplasia: Secondary | ICD-10-CM | POA: Diagnosis not present

## 2019-02-15 DIAGNOSIS — Z7901 Long term (current) use of anticoagulants: Secondary | ICD-10-CM

## 2019-02-15 DIAGNOSIS — Z86010 Personal history of colonic polyps: Secondary | ICD-10-CM

## 2019-02-15 DIAGNOSIS — D509 Iron deficiency anemia, unspecified: Secondary | ICD-10-CM | POA: Diagnosis not present

## 2019-02-15 NOTE — Progress Notes (Signed)
HPI :  78 year old male here for a follow up visit. He has a history of Barrett's esophagus and multiple colonic adenomas, iron deficiency anemia, recurrent C Diff. He has a history of pulmonary embolism and remains on Xarelto.  He was unfortunately admitted to the hospital 07/10/18 for symptomatic anemia, presenting Hgb of 5.6 along with trace heme positive stools, MCV 78. He had evidence of iron deficiency on labs in 2019 but never came to our office. He denied any overt bleeding leading up to his hospitalization. Dr. Ardis Hughs took care of him at the hospital. EGD showed a 4cm segment of nondysplastic Barrett's and a hiatal hernia, but no cause for anemia. Biopsies showed nondysplastic Barrett's. Colonoscopy showed mild radiation proctitis (not treated given absence of symptoms), residual 1.5cm polyp from remote EMR (Dr. Arsenio Loader at [facility]) which was removed and c/w TVA. Dr. Ardis Hughs recommended consideration for 3 year follow up.  We had discussed options in the past and discussed small bowel evaluation given we did not find a clear cause on his EGD and colonoscopy. He initially was agreeable to it however did not schedule and is hoping to avoid any further workup if possible at this point in time. He has normal bowel habits. He thinks sometimes dark due to iron, but he does not see any blood. No abdominal pains. He feels feel. His Hgb has risen on the last 3 draws as below:  09/30/18 - Hgb 12.7, HCV 38.8, MCV 94.4, plt 154, WBC 4.1  12/01/18 - Hgb of 12.8, MCV 98.5  02/08/19 - Hgb 13.3, HCT 39.1, plt 157, WBC 4.2   Endoscopic history: EGD 04/25/2015 -Evidence of Barrett's esophagus was noted at the distal esophagus (C2M3)- no dysplasia, 7cm hiatal hernia, multiple benign gastric polyps. Recall 04/2018 Colonoscopy 04/25/2015 - severe diverticulosis, 3-4cm polyp in ascending colon not removed, another 1cm polyp removed, 68mm sigmoid polyp, radiation proctitis s/p APC. Polyps c/w adenoma. Referred to Dr.  Arsenio Loader for polypectomy Colonoscopy March and Sept 2017 at [facility]  EGD 07/11/18 - Prague classification C4M4 Barrett's esophagus was noted in the distal esophagus. The EG landmarks were not clear given the large hiatal hernia however it looks like the EG junction is at 32cm from the incisors and the proximal edge of the Barrett's mucosa is at 36cm from the incisors. No cameron lesions Colonoscopy 07/11/18 - 1.5cm area of residual polyp, removed piecemeal via EMR, radiation proctitis, mild     Past Medical History:  Diagnosis Date  . AAA (abdominal aortic aneurysm) ([place]) followed by dr Stanford Breed   last duplex 07-17-2017  3.2 x 3.3  . Anticoagulant long-term use    xarelto for hx PEs and DVT  . Arthritis    back  . Barrett's esophagus 2009  . Bilateral lower extremity edema    chronic pedal  . CAD (coronary artery disease) cardioloigst--  dr Stanford Breed   cardiac cath 04-12-2014  single vessel obstructive cad involving ostrium D2, and other nonobstructive cad , normall lvf  . Chronic lower back pain   . 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[place])    per duplex 07-17-2017  left cia 2 x 2, right cia 2.1 x 2  . Diverticulosis   . Dyspnea    with exertion   . Dyspnea on exertion   . FH: colonic polyps   . GERD (gastroesophageal reflux disease)   . Gout    08-27-2017  per pt stable  . History of adenomatous polyp of colon   . History of DVT of lower  extremity    10/ 2015  LLE  . History of esophageal stricture    post dilation  . History of gastric polyp    benign  . History of pulmonary embolus (PE)    2005  treated 1 yr w/ coumadin and 10/ 2015  bilateral  . History of radiation therapy 06/22/2012-08/18/2012   78 Gy to prostate (external beam)  . History of subdural hematoma 03/2012   s/p  evacuation  . Hypertension   . Lesion of bladder    non canerous  . Prostate cancer [facility]) urologist-  dr wrenn/ oncologist- dr Sondra Come   dx 07/ 2013--  Stage T1c, Gleson 7, vol 67cc--- completed external  beam radiation therapy 08-18-2012  . Pulmonary HTN ([place]) 04/12/2014   last echo 05-01-2017  PASP 56mmHg  . RBBB (right bundle branch block with left anterior fascicular block)    ocassional     Past Surgical History:  Procedure Laterality Date  . BIOPSY  07/11/2018   Procedure: BIOPSY;  Surgeon: Milus Banister, MD;  Location[facility];  Service: Endoscopy;;  . CATARACT EXTRACTION W/ INTRAOCULAR LENS  IMPLANT, BILATERAL  2018  . COLONOSCOPY WITH PROPOFOL N/A 04/25/2015   Procedure: COLONOSCOPY WITH PROPOFOL;  Surgeon: Manus Gunning, MD;  [facility];  Service: Gastroenterology;  Laterality: N/A;  . COLONOSCOPY WITH PROPOFOL N/A 07/11/2018   Procedure: COLONOSCOPY WITH PROPOFOL;  Surgeon: Milus Banister, MD;  [facility];  Service: Endoscopy;  Laterality: N/A;  . CRANIOTOMY  04/03/2012   Procedure: CRANIOTOMY HEMATOMA EVACUATION SUBDURAL;  Surgeon: Erline Levine, MD;  Location: [place] [facility];  Service: Neurosurgery;  Laterality: Left;  Left Craniotomy for Evacuation of Subdural Hematoma  . CYSTOSCOPY WITH BIOPSY N/A 09/02/2017   Procedure: CYSTOSCOPY WITH BIOPSY AND FULGURATION POSSIBLE TRANSURETHRAL RESECTION OF BLADDER TUMOR;  Surgeon: [name], MD;  Location: [facility];  Service: Urology;  Laterality: N/A;  . ESOPHAGOGASTRODUODENOSCOPY (EGD) WITH PROPOFOL N/A 04/25/2015   Procedure: ESOPHAGOGASTRODUODENOSCOPY (EGD) WITH PROPOFOL;  Surgeon: Manus Gunning, MD;  [facility];  Service: Gastroenterology;  Laterality: N/A;  . ESOPHAGOGASTRODUODENOSCOPY (EGD) WITH PROPOFOL N/A 07/11/2018   Procedure: ESOPHAGOGASTRODUODENOSCOPY (EGD) WITH PROPOFOL;  Surgeon: Milus Banister, MD;  [facility];  Service: Endoscopy;  Laterality: N/A;  . IRRIGATION AND DEBRIDEMENT SEBACEOUS CYST  01-11-13   "off my back"  . KYPHOPLASTY N/A 12/23/2017   Procedure: Lumbar One KYPHOPLASTY;  Surgeon: Erline Levine, MD;  Location: [place];   Service: Neurosurgery;  Laterality: N/A;  Lumbar One KYPHOPLASTY  . LEFT AND RIGHT HEART CATHETERIZATION WITH CORONARY ANGIOGRAM N/A 04/12/2014   Procedure: LEFT AND RIGHT HEART CATHETERIZATION WITH CORONARY ANGIOGRAM;  Surgeon: Peter M Martinique, MD;  Location: [facility] CATH LAB;  Service: Cardiovascular;  Laterality: N/A;  . ORIF FEMUR FRACTURE Left 10-02-2010   dr Alvan Dame   subtrochanteric  . POLYPECTOMY  07/11/2018   Procedure: POLYPECTOMY;  Surgeon: Milus Banister, MD;  [facility];  Service: Endoscopy;;  . TONSILLECTOMY  1940's  . TRANSTHORACIC ECHOCARDIOGRAM  05/01/2017   mild concentric LVH, ef 0000000, grade 1 diastolic dysfunction/ trivial AR/ mild dilated ascending aorta/ mild to moderate MR (no stenosis)/ mild LAE/ mild TR/ mild to moderate increase PASP 77mmHg   Family History  Problem Relation Age of Onset  . Macular degeneration Mother   . Osteoarthritis Mother   . Aneurysm Father        d/o 82 yo, ruptured AAA  . Heart disease Other  No family history  . Colon cancer Neg Hx   . Colon polyps Neg Hx   . Diabetes Neg Hx    Social History   Tobacco Use  . Smoking status: Former Smoker    Packs/day: 1.00    Years: 20.00    Pack years: 20.00    Types: Cigarettes    Quit date: 12/23/1971    Years since quitting: 47.1  . Smokeless tobacco: Never Used  Substance Use Topics  . Alcohol use: Yes    Alcohol/week: 14.0 standard drinks    Types: 14 Cans of beer per week    Comment: 2 beer daily  . Drug use: No   Current Outpatient Medications  Medication Sig Dispense Refill  . allopurinol (ZYLOPRIM) 300 MG tablet Take 300 mg by mouth every morning.     . celecoxib (CELEBREX) 200 MG capsule Take 20,400 mg by mouth as needed.    . ferrous sulfate 325 (65 FE) MG tablet Take 1 tablet (325 mg total) by mouth daily for 30 days. 30 tablet 0  . furosemide (LASIX) 20 MG tablet Take 1 tablet (20 mg total) by mouth daily for 30 days. 90 tablet 3  . HYDROcodone-acetaminophen  (NORCO/VICODIN) 5-325 MG tablet Take 1 tablet by mouth 2 (two) times daily.    Marland Kitchen olmesartan (BENICAR) 5 MG tablet Take 2 tablets (10 mg total) by mouth daily. 180 tablet 2  . omeprazole (PRILOSEC) 20 MG capsule Take 1 capsule (20 mg total) by mouth daily. 90 capsule 3  . XARELTO 20 MG TABS tablet TAKE 1 TABLET BY MOUTH EVERY DAY WITH SUPPER 90 tablet 1   No current facility-administered medications for this visit.    No Known Allergies   Review of Systems: All systems reviewed and negative except where noted in HPI.   Lab Results  Component Value Date   WBC 4.2 08/17/2018   HGB 11.3 (L) 08/17/2018   HCT 35.6 (L) 08/17/2018   MCV 87.9 08/17/2018   PLT 207.0 08/17/2018    Recent CBCs see above  Physical Exam: BP (!) 164/90 (BP Location: Left Arm, Patient Position: Sitting, Cuff Size: Normal)   Pulse 92   Temp 98.3 F (36.8 C)   Ht 5' 7.5" (1.715 m)   Wt 226 lb (102.5 kg)   BMI 34.87 kg/m  Constitutional: Pleasant, male in no acute distress, walking with cane HEENT: Normocephalic and atraumatic. Conjunctivae are normal. No scleral icterus. Neck supple.  Cardiovascular: Normal rate, regular rhythm.  Pulmonary/chest: Effort normal and breath sounds normal. No wheezing, rales or rhonchi. Abdominal: Soft, nondistended, nontender. . There are no masses palpable. No hepatomegaly. Extremities: no edema Lymphadenopathy: No cervical adenopathy noted. Neurological: Alert and oriented to person place and time. Skin: Skin is warm and dry. No rashes noted. Psychiatric: Normal mood and affect. Behavior is normal.   ASSESSMENT AND PLAN: 78 y/o male here for reassessment of the following:  Iron deficiency anemia / Anticoagulated - no overt bleeding, prior admission in January with EGD and colonoscopy as outlined, unclear what caused this in the setting of anticoagulation.  We had discussed capsule endoscopy to clear the small bowel, ensure no small bowel malignancy.  Initially he was  agreeable to this however has since then he has wanted to avoid any further procedures given his hemoglobin has up trended nicely on oral iron, which is reasonable.  As it stands now the patient declines capsule endoscopy after discussion of risks and benefits and what this would entail.  He will maintain his oral iron and will continue to have a CBC checked every few months.  If he has recurrence of anemia despite oral iron, he would be agreeable to further evaluation.  He will continue to avoid NSAIDs and continue his PPI.  He agreed with the plan  Barrett's esophagus - stable 4cm segment, nondysplastic, recently had an EGD in Jan 2020.  We discussed what this is, risk for malignancy, he will continue his PPI at present dose, controlling reflux well.  We will reassess him in January 2023 to see if he wishes to have any further surveillance, as at that time he will be 78 years old.  History of colon polyps - prior EMR of large right sided colonic polyp at [facility], had a 1.5cm area of recurrence removed by Dr. Ardis Hughs, he is confident this has been entirely removed. Will discuss repeat colonoscopy with him in 3 years pending his course and comorbidities  Oberlin Cellar, MD [facility]

## 2019-02-24 ENCOUNTER — Telehealth: Payer: Self-pay

## 2019-02-24 DIAGNOSIS — D509 Iron deficiency anemia, unspecified: Secondary | ICD-10-CM

## 2019-02-24 NOTE — Telephone Encounter (Signed)
Called and spoke to pt.  He understands to go to the lab one day next week for CBC.

## 2019-02-24 NOTE — Telephone Encounter (Signed)
-----   Message from Roetta Sessions, [place] sent at 12/09/2018  9:52 AM EDT ----- Regarding: cbc Cbc due Sept 1.  See if Dr. Pennie Banter office has done or is doing soon.

## 2019-02-25 ENCOUNTER — Telehealth: Payer: Self-pay

## 2019-02-25 NOTE — Telephone Encounter (Signed)
Called Dr. Pennie Banter office and requested copy of CBC done in August

## 2019-03-17 ENCOUNTER — Other Ambulatory Visit: Payer: Self-pay

## 2019-03-17 MED ORDER — RIVAROXABAN 20 MG PO TABS: 20.0000 mg | ORAL_TABLET | Freq: Every day | ORAL | 1 refills | Status: DC

## 2019-03-30 NOTE — Progress Notes (Signed)
HPI: FU CAD. Pt with h/ounexplained pulmonary embolus years ago treated with one year of Coumadin. Patient was admitted in October of 2013 and found to have a subdural hematoma which required evacuation. Seen for dyspnea 10/15. Cardiac catheterization October 2015 showed an 80% second diagonal but otherwise nonobstructive coronary disease. Ejection fraction 55-65%. Chest CT October 2015 showed bilateral pulmonary emboli.Echocardiogram May 2019 showed normal LV function and mild mitral regurgitation. Patient admitted January 2020 with dyspnea and hemoglobin 5.9. Patient was transfused. He had EGD and colonoscopy that revealed Barrett's esophagus, hiatal hernia, diverticulosis, internal hemorrhoids and radiation proctitis.  Xarelto ultimately resumed. Abdominal ultrasound July 2020 showed 3.2 cm abdominal aortic aneurysm and follow-up recommended 24 months. Since I last saw him,he continues to have dyspnea on exertion which is unchanged.  No orthopnea, PND, chest pain or syncope.  No bleeding.  Minimal pedal edema.  Current Outpatient Medications  Medication Sig Dispense Refill  . allopurinol (ZYLOPRIM) 300 MG tablet Take 300 mg by mouth every morning.     . celecoxib (CELEBREX) 200 MG capsule Take 20,400 mg by mouth as needed.    . furosemide (LASIX) 20 MG tablet Take 1 tablet (20 mg total) by mouth daily for 30 days. 90 tablet 3  . HYDROcodone-acetaminophen (NORCO/VICODIN) 5-325 MG tablet Take 1 tablet by mouth 2 (two) times daily.    Marland Kitchen olmesartan (BENICAR) 5 MG tablet Take 2 tablets (10 mg total) by mouth daily. 180 tablet 2  . omeprazole (PRILOSEC) 20 MG capsule Take 1 capsule (20 mg total) by mouth daily. 90 capsule 3  . rivaroxaban (XARELTO) 20 MG TABS tablet Take 1 tablet (20 mg total) by mouth daily with supper. 90 tablet 1  . ferrous sulfate 325 (65 FE) MG tablet Take 1 tablet (325 mg total) by mouth daily for 30 days. 30 tablet 0   No current facility-administered medications for  this visit.      Past Medical History:  Diagnosis Date  . AAA (abdominal aortic aneurysm) ([place]) followed by dr Stanford Breed   last duplex 07-17-2017  3.2 x 3.3  . Anticoagulant long-term use    xarelto for hx PEs and DVT  . Arthritis    back  . Barrett's esophagus 2009  . Bilateral lower extremity edema    chronic pedal  . CAD (coronary artery disease) cardioloigst--  dr Stanford Breed   cardiac cath 04-12-2014  single vessel obstructive cad involving ostrium D2, and other nonobstructive cad , normall lvf  . Chronic lower back pain   . 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[place])    per duplex 07-17-2017  left cia 2 x 2, right cia 2.1 x 2  . Diverticulosis   . Dyspnea    with exertion   . Dyspnea on exertion   . FH: colonic polyps   . GERD (gastroesophageal reflux disease)   . Gout    08-27-2017  per pt stable  . History of adenomatous polyp of colon   . History of DVT of lower extremity    10/ 2015  LLE  . History of esophageal stricture    post dilation  . History of gastric polyp    benign  . History of pulmonary embolus (PE)    2005  treated 1 yr w/ coumadin and 10/ 2015  bilateral  . History of radiation therapy 06/22/2012-08/18/2012   78 Gy to prostate (external beam)  . History of subdural hematoma 03/2012   s/p  evacuation  . Hypertension   .  Lesion of bladder    non canerous  . Prostate cancer [facility]) urologist-  dr wrenn/ oncologist- dr Sondra Come   dx 07/ 2013--  Stage T1c, Gleson 7, vol 67cc--- completed external beam radiation therapy 08-18-2012  . Pulmonary HTN ([place]) 04/12/2014   last echo 05-01-2017  PASP 14mmHg  . RBBB (right bundle branch block with left anterior fascicular block)    ocassional    Past Surgical History:  Procedure Laterality Date  . BIOPSY  07/11/2018   Procedure: BIOPSY;  Surgeon: Milus Banister, MD;  [facility];  Service: Endoscopy;;  . CATARACT EXTRACTION W/ INTRAOCULAR LENS  IMPLANT, BILATERAL  2018  . COLONOSCOPY WITH PROPOFOL N/A 04/25/2015    Procedure: COLONOSCOPY WITH PROPOFOL;  Surgeon: Manus Gunning, MD;  [facility];  Service: Gastroenterology;  Laterality: N/A;  . COLONOSCOPY WITH PROPOFOL N/A 07/11/2018   Procedure: COLONOSCOPY WITH PROPOFOL;  Surgeon: Milus Banister, MD;  [facility];  Service: Endoscopy;  Laterality: N/A;  . CRANIOTOMY  04/03/2012   Procedure: CRANIOTOMY HEMATOMA EVACUATION SUBDURAL;  Surgeon: Erline Levine, MD;  Location: [place] [facility];  Service: Neurosurgery;  Laterality: Left;  Left Craniotomy for Evacuation of Subdural Hematoma  . CYSTOSCOPY WITH BIOPSY N/A 09/02/2017   Procedure: CYSTOSCOPY WITH BIOPSY AND FULGURATION POSSIBLE TRANSURETHRAL RESECTION OF BLADDER TUMOR;  Surgeon: [name], MD;  [facility];  Service: Urology;  Laterality: N/A;  . ESOPHAGOGASTRODUODENOSCOPY (EGD) WITH PROPOFOL N/A 04/25/2015   Procedure: ESOPHAGOGASTRODUODENOSCOPY (EGD) WITH PROPOFOL;  Surgeon: Manus Gunning, MD;  [facility];  Service: Gastroenterology;  Laterality: N/A;  . ESOPHAGOGASTRODUODENOSCOPY (EGD) WITH PROPOFOL N/A 07/11/2018   Procedure: ESOPHAGOGASTRODUODENOSCOPY (EGD) WITH PROPOFOL;  Surgeon: Milus Banister, MD;  [facility];  Service: Endoscopy;  Laterality: N/A;  . IRRIGATION AND DEBRIDEMENT SEBACEOUS CYST  01-11-13   "off my back"  . KYPHOPLASTY N/A 12/23/2017   Procedure: Lumbar One KYPHOPLASTY;  Surgeon: Erline Levine, MD;  Location: [place];  Service: Neurosurgery;  Laterality: N/A;  Lumbar One KYPHOPLASTY  . LEFT AND RIGHT HEART CATHETERIZATION WITH CORONARY ANGIOGRAM N/A 04/12/2014   Procedure: LEFT AND RIGHT HEART CATHETERIZATION WITH CORONARY ANGIOGRAM;  Surgeon: Peter M Martinique, MD;  [facility] CATH LAB;  Service: Cardiovascular;  Laterality: N/A;  . ORIF FEMUR FRACTURE Left 10-02-2010   dr Alvan Dame   subtrochanteric  . POLYPECTOMY  07/11/2018   Procedure: POLYPECTOMY;  Surgeon: Milus Banister, MD;  [facility];  Service: Endoscopy;;  . TONSILLECTOMY  1940's  . TRANSTHORACIC ECHOCARDIOGRAM  05/01/2017   mild concentric LVH, ef 0000000, grade 1 diastolic dysfunction/ trivial AR/ mild dilated ascending aorta/ mild to moderate MR (no stenosis)/ mild LAE/ mild TR/ mild to moderate increase PASP 66mmHg    Social History   Socioeconomic History  . Marital status: Married    Spouse name: Not on file  . Number of children: 3  . Years of education: Not on file  . Highest education level: Not on file  Occupational History  . 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 Financial resource strain: Not on file  . Food insecurity    Worry: Not on file    Inability: Not on file  . Transportation needs    Medical: Not on file    Non-medical: Not on file  Tobacco Use  . Smoking status: Former Smoker    Packs/day: 1.00    Years: 20.00  Pack years: 20.00    Types: Cigarettes    Quit date: 12/23/1971    Years since quitting: 47.3  . Smokeless tobacco: Never Used  Substance and Sexual Activity  . Alcohol use: Yes    Alcohol/week: 14.0 standard drinks    Types: 14 Cans of beer per week    Comment: 2 beer daily  . Drug use: No  . Sexual activity: Yes  Lifestyle  . Physical activity    Days per week: Not on file    Minutes per session: Not on file  . Stress: Not on file  Relationships  . Social Herbalist on phone: Not on file    Gets together: Not on file    Attends religious service: Not on file    Active member of club or organization: Not on file    Attends meetings of clubs or organizations: Not on file    Relationship status: Not on file  . Intimate partner violence    Fear of current or ex partner: Not on file    Emotionally abused: Not on file    Physically abused: Not on file    Forced sexual activity: Not on file  Other Topics Concern  . Not on file  Social History Narrative   Originally from [place].  Retired  to [employer] because he was tired of all the snow.    Family History  Problem Relation Age of Onset  . Macular degeneration Mother   . Osteoarthritis Mother   . Aneurysm Father        d/o 57 yo, ruptured AAA  . Heart disease Other        No family history  . Colon cancer Neg Hx   . Colon polyps Neg Hx   . Diabetes Neg Hx     ROS: Some back discomfort but no fevers or chills, productive cough, hemoptysis, dysphasia, odynophagia, melena, hematochezia, dysuria, hematuria, rash, seizure activity, orthopnea, PND, claudication. Remaining systems are negative.  Physical Exam: Well-developed well-nourished in no acute distress.  Skin is warm and dry.  HEENT is normal.  Neck is supple.  Chest is clear to auscultation with normal expansion.  Cardiovascular exam is regular rate and rhythm.  Abdominal exam nontender or distended. No masses palpated. Extremities show trace edema. neuro grossly intact  ECG-sinus rhythm with occasional PVC, right bundle branch block, left anterior fascicular block.  Personally reviewed  A/P  1 coronary artery disease-patient has not had chest pain and previous catheterization revealed mild disease.  He is not on aspirin given need for anticoagulation.  He declined statins previously.  2 abdominal aortic aneurysm-plan follow-up ultrasound July 2022.  3 history of recurrent pulmonary emboli-continue Xarelto.  He will require lifelong anticoagulation.  4 hypertension-blood pressure is elevated.  Increase Benicar to 20 mg daily.  Check potassium and renal function in 1 week.  5 dyspnea-this is felt to be multifactorial including deconditioning, previous pulmonary emboli and restrictive lung disease.  Kirk Ruths, MD

## 2019-04-09 ENCOUNTER — Ambulatory Visit (INDEPENDENT_AMBULATORY_CARE_PROVIDER_SITE_OTHER): Payer: PPO | Admitting: Cardiology

## 2019-04-09 ENCOUNTER — Encounter: Payer: Self-pay | Admitting: Cardiology

## 2019-04-09 ENCOUNTER — Other Ambulatory Visit: Payer: Self-pay

## 2019-04-09 VITALS — BP 168/88 | HR 92 | Temp 98.1°F | Ht 72.0 in | Wt 227.0 lb

## 2019-04-09 DIAGNOSIS — R0600 Dyspnea, unspecified: Secondary | ICD-10-CM

## 2019-04-09 DIAGNOSIS — I10 Essential (primary) hypertension: Secondary | ICD-10-CM | POA: Diagnosis not present

## 2019-04-09 DIAGNOSIS — I2510 Atherosclerotic heart disease of native coronary artery without angina pectoris: Secondary | ICD-10-CM | POA: Diagnosis not present

## 2019-04-09 DIAGNOSIS — I714 Abdominal aortic aneurysm, without rupture, unspecified: Secondary | ICD-10-CM

## 2019-04-09 DIAGNOSIS — R0609 Other forms of dyspnea: Secondary | ICD-10-CM

## 2019-04-09 MED ORDER — OLMESARTAN MEDOXOMIL 20 MG PO TABS: 20.0000 mg | ORAL_TABLET | Freq: Every day | ORAL | 3 refills | Status: DC

## 2019-04-09 NOTE — Patient Instructions (Signed)
Medication Instructions:  INCREASE OLMESARTAN TO 20 MG ONCE DAILY= 4 OF THE 5 MG TABLETS ONCE DAILY  *If you need a refill on your cardiac medications before your next appointment, please call your pharmacy*  Lab Work: Your physician recommends that you return for lab work in: [place]  If you have labs (blood work) drawn today and your tests are completely normal, you will receive your results only by: Marland Kitchen MyChart Message (if you have MyChart) OR . A paper copy in the mail If you have any lab test that is abnormal or we need to change your treatment, we will call you to review the results.  Follow-Up: At [facility], you and your health needs are our priority.  As part of our continuing mission to provide you with exceptional heart care, we have created designated Provider Care Teams.  These Care Teams include your primary Cardiologist (physician) and Advanced Practice Providers (APPs -  Physician Assistants and Nurse Practitioners) who all work together to provide you with the care you need, when you need it.  Your next appointment:   12 months  The format for your next appointment:   In Person  Provider:   Kirk Ruths, MD

## 2019-04-15 DIAGNOSIS — I10 Essential (primary) hypertension: Secondary | ICD-10-CM | POA: Diagnosis not present

## 2019-04-16 LAB — BASIC METABOLIC PANEL
BUN/Creatinine Ratio: 21 (ref 10–24)
BUN: 21 mg/dL (ref 8–27)
CO2: 21 mmol/L (ref 20–29)
Calcium: 8.9 mg/dL (ref 8.6–10.2)
Chloride: 104 mmol/L (ref 96–106)
Creatinine, Ser: 1.02 mg/dL (ref 0.76–1.27)
GFR calc Af Amer: 80 mL/min/{1.73_m2} (ref >59)
GFR calc non Af Amer: 70 mL/min/{1.73_m2} (ref >59)
Glucose: 85 mg/dL (ref 65–99)
Potassium: 4.4 mmol/L (ref 3.5–5.2)
Sodium: 142 mmol/L (ref 134–144)

## 2019-04-28 DIAGNOSIS — M4015 Other secondary kyphosis, thoracolumbar region: Secondary | ICD-10-CM | POA: Diagnosis not present

## 2019-04-28 DIAGNOSIS — I10 Essential (primary) hypertension: Secondary | ICD-10-CM | POA: Diagnosis not present

## 2019-04-28 DIAGNOSIS — Z6834 Body mass index (BMI) 34.0-34.9, adult: Secondary | ICD-10-CM | POA: Diagnosis not present

## 2019-04-28 DIAGNOSIS — L720 Epidermal cyst: Secondary | ICD-10-CM | POA: Diagnosis not present

## 2019-05-06 ENCOUNTER — Other Ambulatory Visit (HOSPITAL_COMMUNITY): Payer: Self-pay | Admitting: Neurosurgery

## 2019-05-06 ENCOUNTER — Other Ambulatory Visit: Payer: Self-pay | Admitting: Neurosurgery

## 2019-05-06 DIAGNOSIS — L720 Epidermal cyst: Secondary | ICD-10-CM

## 2019-05-12 DIAGNOSIS — K922 Gastrointestinal hemorrhage, unspecified: Secondary | ICD-10-CM | POA: Diagnosis not present

## 2019-05-13 ENCOUNTER — Other Ambulatory Visit: Payer: Self-pay

## 2019-05-13 ENCOUNTER — Ambulatory Visit (HOSPITAL_COMMUNITY)
Admission: RE | Admit: 2019-05-13 | Discharge: 2019-05-13 | Disposition: A | Discharge status: Home / Self Care | Payer: PPO | Source: Ambulatory Visit | Attending: Neurosurgery | Admitting: Neurosurgery

## 2019-05-13 DIAGNOSIS — L720 Epidermal cyst: Secondary | ICD-10-CM | POA: Diagnosis not present

## 2019-05-13 DIAGNOSIS — S32010A Wedge compression fracture of first lumbar vertebra, initial encounter for closed fracture: Secondary | ICD-10-CM | POA: Diagnosis not present

## 2019-05-13 DIAGNOSIS — M549 Dorsalgia, unspecified: Secondary | ICD-10-CM | POA: Diagnosis not present

## 2019-05-13 MED ORDER — IOHEXOL 300 MG/ML  SOLN
100.0000 mL | Freq: Once | INTRAMUSCULAR | Status: AC | PRN
  Administered 2019-05-13: 100 mL via INTRAVENOUS

## 2019-05-13 MED ORDER — SODIUM CHLORIDE (PF) 0.9 % IJ SOLN
INTRAMUSCULAR | Status: AC
  Filled 2019-05-13: qty 50

## 2019-05-14 ENCOUNTER — Telehealth: Payer: Self-pay

## 2019-05-14 NOTE — Telephone Encounter (Signed)
Received patient's CBC results  from [place] in basket to be scanned into chart

## 2019-05-19 DIAGNOSIS — L720 Epidermal cyst: Secondary | ICD-10-CM | POA: Diagnosis not present

## 2019-06-08 ENCOUNTER — Telehealth: Payer: Self-pay | Admitting: Gastroenterology

## 2019-06-08 NOTE — Telephone Encounter (Signed)
Called patient and gave lab results, let him know Dr. Havery Moros would like him to continue his iron and current meds. Told him we would plan to do a repeat CBC in 6 months

## 2019-06-08 NOTE — Telephone Encounter (Signed)
Labs arrived - done 05/12/19 - Hgb 13.9, HCT 42.2, MCV 101.9, WBC 6.6. plt 182  Patient should continue iron and his medications. His anemia has resolved. Would repeat CBC in 6 months at this point.  Sherlynn Stalls can you please let the patient know. Thanks

## 2019-07-06 ENCOUNTER — Ambulatory Visit: Payer: Medicare Other | Attending: Internal Medicine

## 2019-07-06 DIAGNOSIS — Z23 Encounter for immunization: Secondary | ICD-10-CM | POA: Insufficient documentation

## 2019-07-06 NOTE — Progress Notes (Signed)
   Covid-19 Vaccination Clinic  Name:  [name]    MRN: [id number] DOB: 1940-08-25  07/06/2019  [name] was observed post Covid-19 immunization for 30 minutes based on pre-vaccination screening without incidence. He was provided with Vaccine Information Sheet and instruction to access the V-Safe system.   [name] was instructed to call 911 with any severe reactions post vaccine: Marland Kitchen Difficulty breathing  . Swelling of your face and throat  . A fast heartbeat  . A bad rash all over your body  . Dizziness and weakness    Immunizations Administered    Name Date Dose VIS Date Route   Pfizer COVID-19 Vaccine 07/06/2019 10:27 AM 0.3 mL 06/04/2019 Intramuscular   Manufacturer: [employer] [place]   Lot: [id number]   [place]: [id number]

## 2019-07-07 ENCOUNTER — Ambulatory Visit: Payer: PPO

## 2019-07-19 DIAGNOSIS — L720 Epidermal cyst: Secondary | ICD-10-CM | POA: Diagnosis not present

## 2019-07-19 DIAGNOSIS — M545 Low back pain: Secondary | ICD-10-CM | POA: Diagnosis not present

## 2019-07-19 DIAGNOSIS — I10 Essential (primary) hypertension: Secondary | ICD-10-CM | POA: Diagnosis not present

## 2019-07-19 DIAGNOSIS — M4015 Other secondary kyphosis, thoracolumbar region: Secondary | ICD-10-CM | POA: Diagnosis not present

## 2019-07-26 ENCOUNTER — Ambulatory Visit: Payer: PPO | Attending: Internal Medicine

## 2019-07-26 DIAGNOSIS — Z23 Encounter for immunization: Secondary | ICD-10-CM

## 2019-07-26 NOTE — Progress Notes (Signed)
   Covid-19 Vaccination Clinic  Name:  Rodner Kummer    MRN: [id number] DOB: 1941/04/10  07/26/2019  [name] was observed post Covid-19 immunization for 15 minutes without incidence. He was provided with Vaccine Information Sheet and instruction to access the V-Safe system.   [name] was instructed to call 911 with any severe reactions post vaccine: Marland Kitchen Difficulty breathing  . Swelling of your face and throat  . A fast heartbeat  . A bad rash all over your body  . Dizziness and weakness    Immunizations Administered    Name Date Dose VIS Date Route   Pfizer COVID-19 Vaccine 07/26/2019 10:00 AM 0.3 mL 06/04/2019 Intramuscular   Manufacturer: [place]   Lot: [id number]   [place]: [id number]

## 2019-08-09 ENCOUNTER — Other Ambulatory Visit: Payer: Self-pay

## 2019-08-09 MED ORDER — OMEPRAZOLE 20 MG PO CPDR: 20.0000 mg | DELAYED_RELEASE_CAPSULE | Freq: Every day | ORAL | 1 refills | Status: DC

## 2019-08-09 NOTE — Progress Notes (Signed)
rec'd fax refill request for omeprazole 20 mg daily.  Pt last seen 02-15-19; to Continue on omep 20mg  once daily.

## 2019-08-11 DIAGNOSIS — D649 Anemia, unspecified: Secondary | ICD-10-CM | POA: Diagnosis not present

## 2019-09-08 ENCOUNTER — Other Ambulatory Visit: Payer: Self-pay

## 2019-09-08 MED ORDER — RIVAROXABAN 20 MG PO TABS: 20.0000 mg | ORAL_TABLET | Freq: Every day | ORAL | 1 refills | Status: DC

## 2019-09-29 DIAGNOSIS — M545 Low back pain: Secondary | ICD-10-CM | POA: Diagnosis not present

## 2019-11-10 DIAGNOSIS — D649 Anemia, unspecified: Secondary | ICD-10-CM | POA: Diagnosis not present

## 2019-11-30 ENCOUNTER — Other Ambulatory Visit: Payer: Self-pay

## 2019-11-30 DIAGNOSIS — D509 Iron deficiency anemia, unspecified: Secondary | ICD-10-CM

## 2019-12-06 ENCOUNTER — Telehealth: Payer: Self-pay | Admitting: Gastroenterology

## 2019-12-06 NOTE — Telephone Encounter (Signed)
Pt called in reference to the letter that we mailed him about lab work that he is due for. He stated that he had lab work on 5/19 with Dr. Shelia Media and he sent a copy to Dr. Havery Moros through [facility]. He wants to know if he needs different labs or if those that he recently had would suffice. Pls call pt. Do not communicate with him through my chart because he states that Mychart was updated and is taking him a while to familiarize with that again.

## 2019-12-06 NOTE — Telephone Encounter (Signed)
Patient is due a CBC have you seen results from Dr. Shelia Media?

## 2019-12-07 NOTE — Telephone Encounter (Signed)
Sheri please let the patient know I did receive his blood work, he sent it to use via [facility]. Dr. Shelia Media can continue to check this periodically for him at this point, Hgb has been stable for over a year s/p endoscopic evaluation.

## 2019-12-07 NOTE — Telephone Encounter (Signed)
Patient notified

## 2019-12-22 DIAGNOSIS — M545 Low back pain: Secondary | ICD-10-CM | POA: Diagnosis not present

## 2019-12-22 DIAGNOSIS — I10 Essential (primary) hypertension: Secondary | ICD-10-CM | POA: Diagnosis not present

## 2020-01-28 ENCOUNTER — Other Ambulatory Visit: Payer: Self-pay | Admitting: Gastroenterology

## 2020-02-03 DIAGNOSIS — R3912 Poor urinary stream: Secondary | ICD-10-CM | POA: Diagnosis not present

## 2020-02-03 DIAGNOSIS — Z8546 Personal history of malignant neoplasm of prostate: Secondary | ICD-10-CM | POA: Diagnosis not present

## 2020-02-03 DIAGNOSIS — R3121 Asymptomatic microscopic hematuria: Secondary | ICD-10-CM | POA: Diagnosis not present

## 2020-02-03 DIAGNOSIS — N3040 Irradiation cystitis without hematuria: Secondary | ICD-10-CM | POA: Diagnosis not present

## 2020-02-07 DIAGNOSIS — H524 Presbyopia: Secondary | ICD-10-CM | POA: Diagnosis not present

## 2020-02-07 DIAGNOSIS — Z961 Presence of intraocular lens: Secondary | ICD-10-CM | POA: Diagnosis not present

## 2020-02-07 DIAGNOSIS — H52203 Unspecified astigmatism, bilateral: Secondary | ICD-10-CM | POA: Diagnosis not present

## 2020-02-09 DIAGNOSIS — D509 Iron deficiency anemia, unspecified: Secondary | ICD-10-CM | POA: Diagnosis not present

## 2020-02-09 DIAGNOSIS — I10 Essential (primary) hypertension: Secondary | ICD-10-CM | POA: Diagnosis not present

## 2020-02-14 IMAGING — CT CT L SPINE W/O CM
2 of 5 series · 13 of 35 positions shown, 16 images · non-contrast
Comparison: Lumbar radiographs 12/10/2017.  Lumbar MRI 10/05/2012

CLINICAL DATA: Closed compression fracture L1. Fall 1 month ago.
Back pain.

EXAM:
CT LUMBAR SPINE WITHOUT CONTRAST
TECHNIQUE: Multidetector CT imaging of the lumbar spine was performed without
intravenous contrast administration. Multiplanar CT image
reconstructions were also generated.

[Series 5: l spine soft (person_name) · axial · 0.37mm/px · z∈[-132,+98]mm · 8 of 137 slices shown, 10 images]
[im 11/137  soft-tissue]
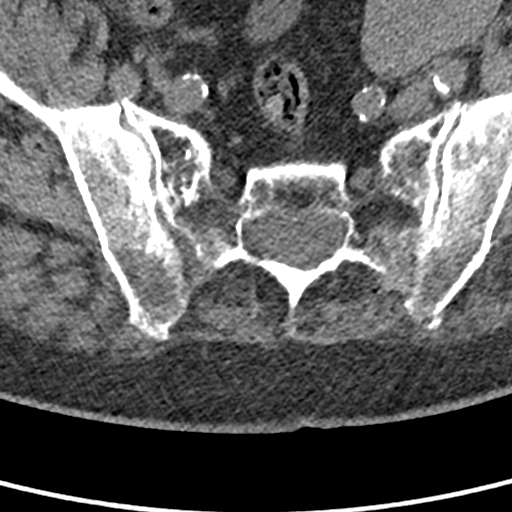
[im 11/137  bone]
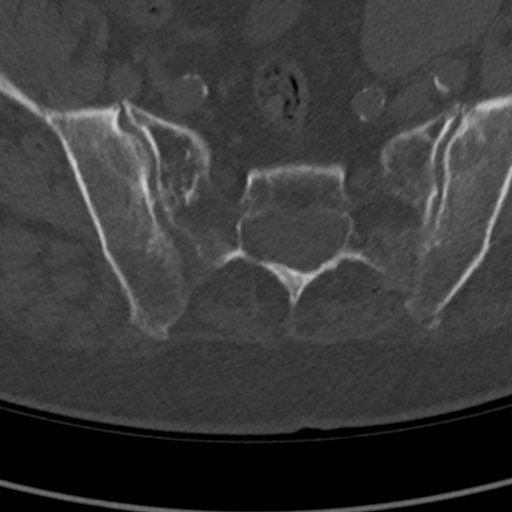
[im 32/137  bone]
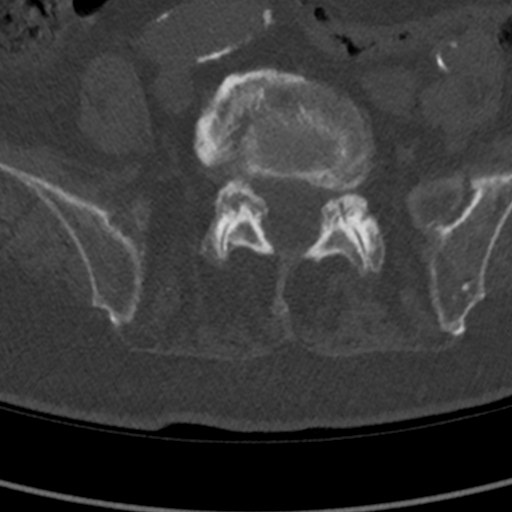
[im 42/137  bone]
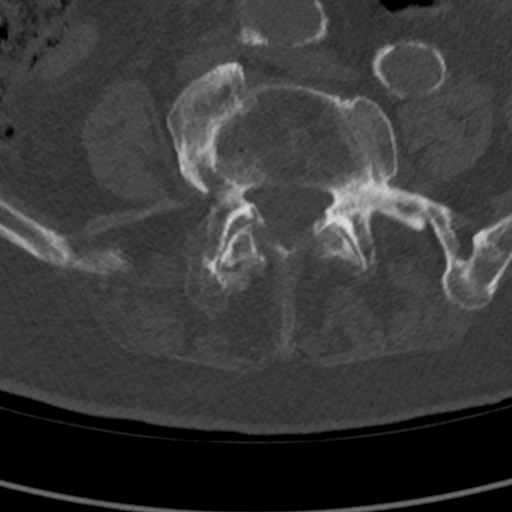
[im 63/137  bone]
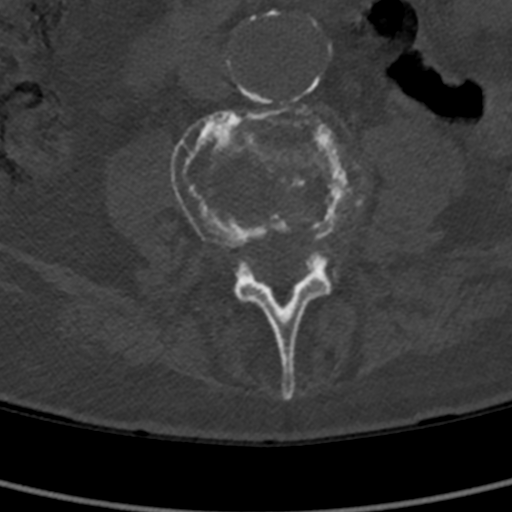
[im 74/137  soft-tissue]
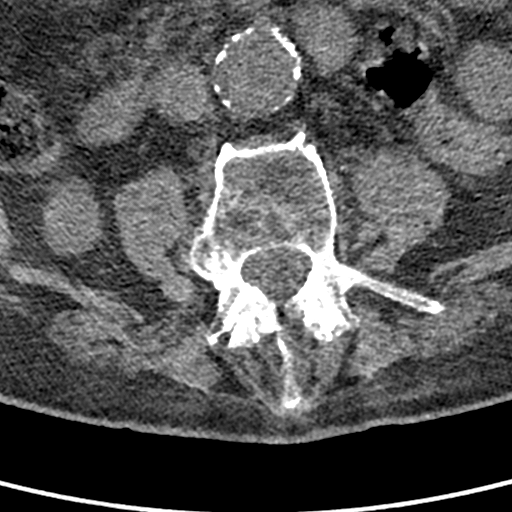
[im 74/137  bone]
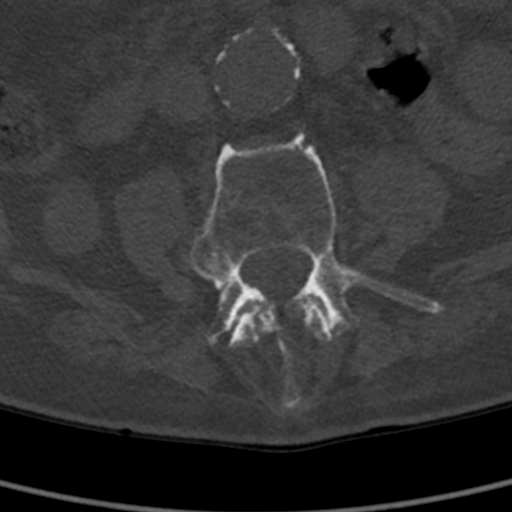
[im 95/137  bone]
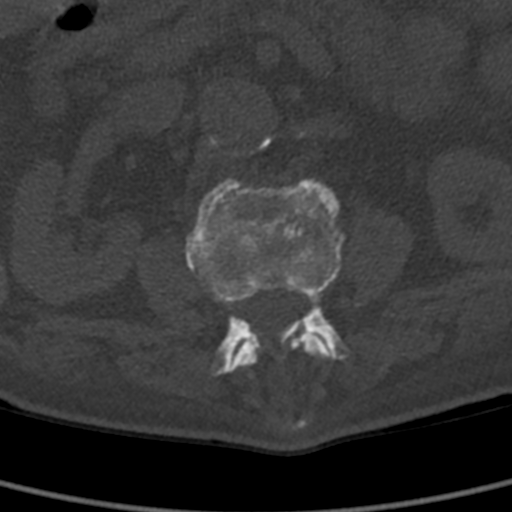
[im 105/137  bone]
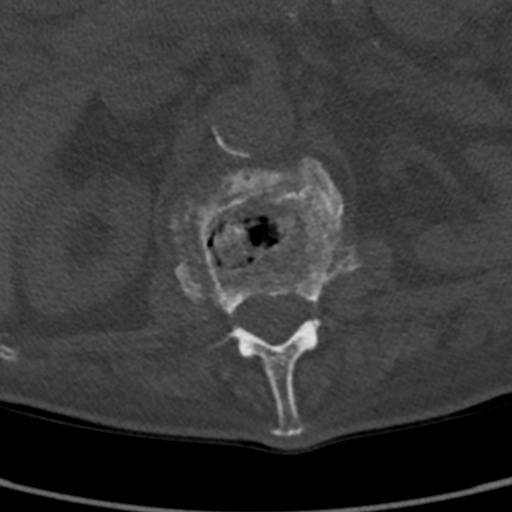
[im 126/137  bone]
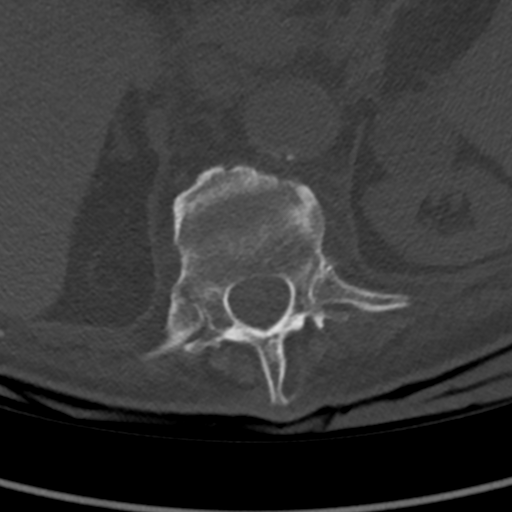

[Series 7: sagittal bone · sagittal · 0.47mm/px · 5 of 67 slices shown, 6 images]
[im 23/67  bone]
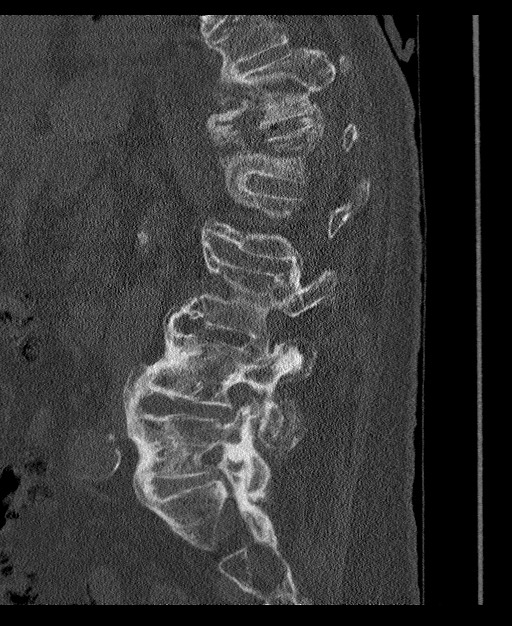
[im 28/67  bone]
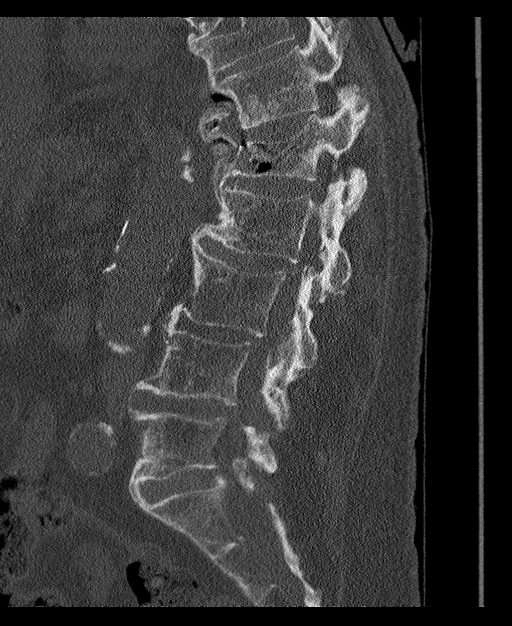
[im 34/67  soft-tissue]
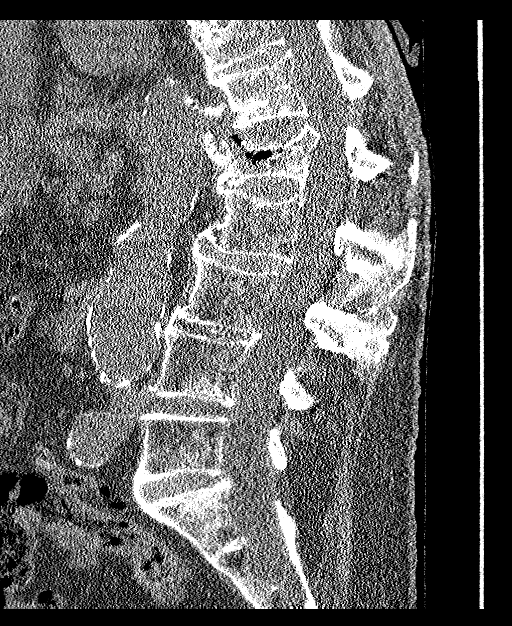
[im 34/67  bone]
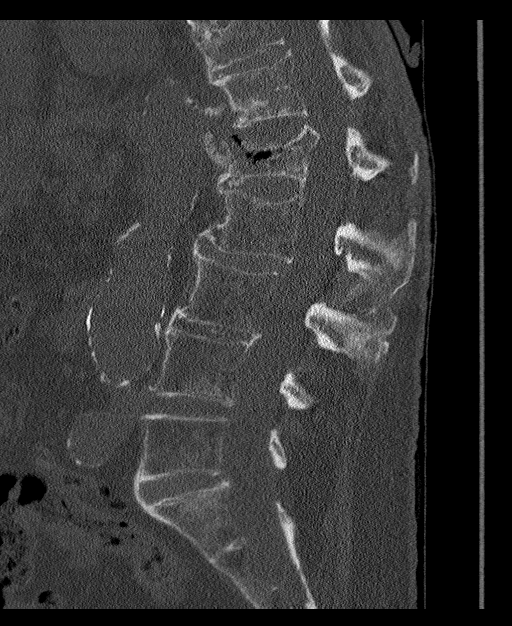
[im 39/67  bone]
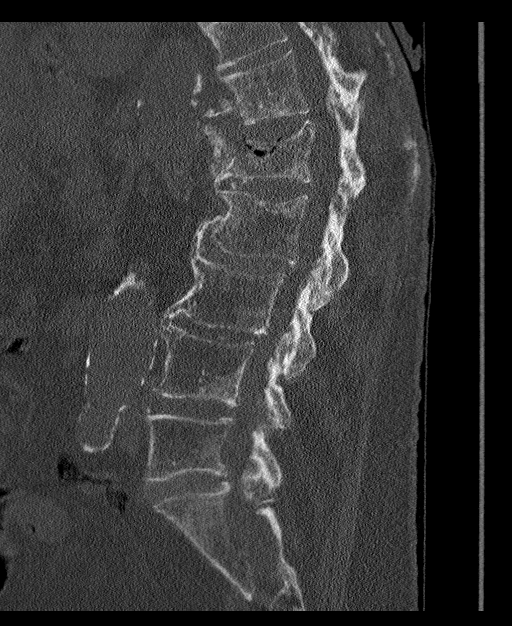
[im 45/67  bone]
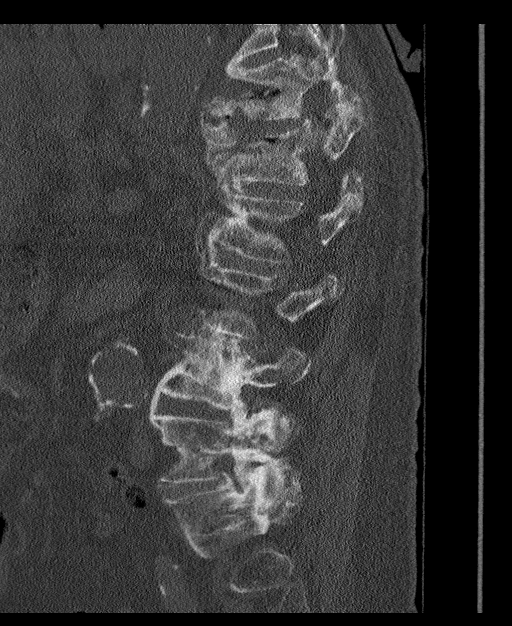

[13 of 35 positions shown; findings below may reference images not displayed]

FINDINGS: Segmentation: Normal

Alignment: Normal

Vertebrae: Moderately severe compression fracture of L1. Horizontal
vertebral fracture. Fracture lines have not healed and there is gas
in the fracture lines. Comminuted vertebral fracture with horizontal
fracture line. Mild retropulsion of L1 into the canal. No posterior
element fracture.

Mild chronic fracture T11.  No other acute fracture identified

Paraspinal and other soft tissues: Atherosclerotic aorta with mild
aneurysmal dilatation measuring 39 mm. Mild aneurysmal dilatation of
the iliac arteries bilaterally. No retroperitoneal mass or fluid
collection.

Disc levels: T12-L1: Mild retropulsion of L1 fracture into the canal
without significant stenosis. Bilateral facet degeneration

L1-2: Mild disc and facet degeneration without stenosis

L2-3: Mild disc and facet degeneration without significant stenosis

L3-4: Mild disc and facet degeneration.  No significant stenosis

L4-5: Moderate facet degeneration bilaterally. No significant spinal
or foraminal stenosis

L5-S1: Bilateral facet hypertrophy causing subarticular stenosis
bilaterally.
IMPRESSION: Moderately severe compression fracture of L1. Horizontal fracture
line in the vertebral body with gas in the fracture. No evidence of
bony healing. Mild retropulsion of L1 into the canal without
significant stenosis. No posterior element fracture.

Chronic compression fracture T11

Multilevel disc and facet degeneration throughout the lumbar spine.

## 2020-02-16 DIAGNOSIS — L570 Actinic keratosis: Secondary | ICD-10-CM | POA: Diagnosis not present

## 2020-02-16 DIAGNOSIS — M545 Low back pain: Secondary | ICD-10-CM | POA: Diagnosis not present

## 2020-02-16 DIAGNOSIS — C61 Malignant neoplasm of prostate: Secondary | ICD-10-CM | POA: Diagnosis not present

## 2020-02-16 DIAGNOSIS — Z0001 Encounter for general adult medical examination with abnormal findings: Secondary | ICD-10-CM | POA: Diagnosis not present

## 2020-02-16 DIAGNOSIS — K219 Gastro-esophageal reflux disease without esophagitis: Secondary | ICD-10-CM | POA: Diagnosis not present

## 2020-02-16 DIAGNOSIS — I714 Abdominal aortic aneurysm, without rupture: Secondary | ICD-10-CM | POA: Diagnosis not present

## 2020-02-16 DIAGNOSIS — I2510 Atherosclerotic heart disease of native coronary artery without angina pectoris: Secondary | ICD-10-CM | POA: Diagnosis not present

## 2020-02-16 DIAGNOSIS — J439 Emphysema, unspecified: Secondary | ICD-10-CM | POA: Diagnosis not present

## 2020-02-16 DIAGNOSIS — I10 Essential (primary) hypertension: Secondary | ICD-10-CM | POA: Diagnosis not present

## 2020-02-16 DIAGNOSIS — D72819 Decreased white blood cell count, unspecified: Secondary | ICD-10-CM | POA: Diagnosis not present

## 2020-02-16 DIAGNOSIS — D6859 Other primary thrombophilia: Secondary | ICD-10-CM | POA: Diagnosis not present

## 2020-03-08 ENCOUNTER — Other Ambulatory Visit: Payer: Self-pay | Admitting: Cardiology

## 2020-03-08 NOTE — Telephone Encounter (Signed)
Please review request. KW 

## 2020-03-10 DIAGNOSIS — Z23 Encounter for immunization: Secondary | ICD-10-CM | POA: Diagnosis not present

## 2020-03-21 NOTE — Progress Notes (Signed)
HPI: FU CAD. Pt with h/ounexplained pulmonary embolus years ago treated with one year of Coumadin. Patient was admitted in October of 2013 and found to have a subdural hematoma which required evacuation. Seen for dyspnea 10/15. Cardiac catheterization October 2015 showed an 80% second diagonal but otherwise nonobstructive coronary disease. Ejection fraction 55-65%. Chest CT October 2015 showed bilateral pulmonary emboli.EchocardiogramMay2019 showed normal LV function and mild mitral regurgitation.Patient admitted January 2020 with dyspnea and hemoglobin 5.9. Patient was transfused. He had EGD and colonoscopy that revealed Barrett's esophagus, hiatal hernia, diverticulosis, internal hemorrhoids and radiation proctitis. Xarelto ultimately resumed. Abdominal ultrasound July 2020 showed 3.2 cm abdominal aortic aneurysm and follow-up recommended 24 months. Since I last saw him,he does have some dyspnea on exertion unchanged.  No orthopnea, PND, pedal edema, chest pain, syncope or bleeding.  Current Outpatient Medications  Medication Sig Dispense Refill  . allopurinol (ZYLOPRIM) 300 MG tablet Take 300 mg by mouth every morning.     . ferrous sulfate 325 (65 FE) MG tablet Take 1 tablet (325 mg total) by mouth daily for 30 days. 30 tablet 0  . HYDROcodone-acetaminophen (NORCO/VICODIN) 5-325 MG tablet Take 1 tablet by mouth 2 (two) times daily.    Marland Kitchen olmesartan (BENICAR) 20 MG tablet Take 1 tablet (20 mg total) by mouth daily. 90 tablet 3  . omeprazole (PRILOSEC) 20 MG capsule TAKE 1 CAPSULE(20 MG) BY MOUTH DAILY 90 capsule 1  . XARELTO 20 MG TABS tablet TAKE 1 TABLET(20 MG) BY MOUTH DAILY WITH SUPPER 90 tablet 1   No current facility-administered medications for this visit.     Past Medical History:  Diagnosis Date  . AAA (abdominal aortic aneurysm) ([place]) followed by dr Stanford Breed   last duplex 07-17-2017  3.2 x 3.3  . Anticoagulant long-term use    xarelto for hx PEs and DVT  . Arthritis     back  . Barrett's esophagus 2009  . Bilateral lower extremity edema    chronic pedal  . CAD (coronary artery disease) cardioloigst--  dr Stanford Breed   cardiac cath 04-12-2014  single vessel obstructive cad involving ostrium D2, and other nonobstructive cad , normall lvf  . Chronic lower back pain   . 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[place])    per duplex 07-17-2017  left cia 2 x 2, right cia 2.1 x 2  . Diverticulosis   . Dyspnea    with exertion   . Dyspnea on exertion   . FH: colonic polyps   . GERD (gastroesophageal reflux disease)   . Gout    08-27-2017  per pt stable  . History of adenomatous polyp of colon   . History of DVT of lower extremity    10/ 2015  LLE  . History of esophageal stricture    post dilation  . History of gastric polyp    benign  . History of pulmonary embolus (PE)    2005  treated 1 yr w/ coumadin and 10/ 2015  bilateral  . History of radiation therapy 06/22/2012-08/18/2012   78 Gy to prostate (external beam)  . History of subdural hematoma 03/2012   s/p  evacuation  . Hypertension   . Lesion of bladder    non canerous  . Prostate cancer [facility]) urologist-  dr wrenn/ oncologist- dr Sondra Come   dx 07/ 2013--  Stage T1c, [place] 7, vol 67cc--- completed external beam radiation therapy 08-18-2012  . Pulmonary HTN ([place]) 04/12/2014   last echo 05-01-2017  PASP 84mmHg  .  RBBB (right bundle branch block with left anterior fascicular block)    ocassional    Past Surgical History:  Procedure Laterality Date  . BIOPSY  07/11/2018   Procedure: BIOPSY;  Surgeon: Milus Banister, MD;  Location: [facility];  Service: Endoscopy;;  . CATARACT EXTRACTION W/ INTRAOCULAR LENS  IMPLANT, BILATERAL  2018  . COLONOSCOPY WITH PROPOFOL N/A 04/25/2015   Procedure: COLONOSCOPY WITH PROPOFOL;  Surgeon: Manus Gunning, MD;  Location: [facility];  Service: Gastroenterology;  Laterality: N/A;  . COLONOSCOPY WITH PROPOFOL N/A 07/11/2018   Procedure: COLONOSCOPY WITH PROPOFOL;   Surgeon: Milus Banister, MD;  Location: [facility];  Service: Endoscopy;  Laterality: N/A;  . CRANIOTOMY  04/03/2012   Procedure: CRANIOTOMY HEMATOMA EVACUATION SUBDURAL;  Surgeon: Erline Levine, MD;  Location: [facility];  Service: Neurosurgery;  Laterality: Left;  Left Craniotomy for Evacuation of Subdural Hematoma  . CYSTOSCOPY WITH BIOPSY N/A 09/02/2017   Procedure: CYSTOSCOPY WITH BIOPSY AND FULGURATION POSSIBLE TRANSURETHRAL RESECTION OF BLADDER TUMOR;  Surgeon: [name], MD;  [facility];  Service: Urology;  Laterality: N/A;  . ESOPHAGOGASTRODUODENOSCOPY (EGD) WITH PROPOFOL N/A 04/25/2015   Procedure: ESOPHAGOGASTRODUODENOSCOPY (EGD) WITH PROPOFOL;  Surgeon: Manus Gunning, MD;  Location: [facility];  Service: Gastroenterology;  Laterality: N/A;  . ESOPHAGOGASTRODUODENOSCOPY (EGD) WITH PROPOFOL N/A 07/11/2018   Procedure: ESOPHAGOGASTRODUODENOSCOPY (EGD) WITH PROPOFOL;  Surgeon: Milus Banister, MD;  Location: [facility];  Service: Endoscopy;  Laterality: N/A;  . IRRIGATION AND DEBRIDEMENT SEBACEOUS CYST  01-11-13   "off my back"  . KYPHOPLASTY N/A 12/23/2017   Procedure: Lumbar One KYPHOPLASTY;  Surgeon: Erline Levine, MD;  Location: [facility];  Service: Neurosurgery;  Laterality: N/A;  Lumbar One KYPHOPLASTY  . LEFT AND RIGHT HEART CATHETERIZATION WITH CORONARY ANGIOGRAM N/A 04/12/2014   Procedure: LEFT AND RIGHT HEART CATHETERIZATION WITH CORONARY ANGIOGRAM;  Surgeon: Peter M Martinique, MD;  Location: [facility] CATH LAB;  Service: Cardiovascular;  Laterality: N/A;  . ORIF FEMUR FRACTURE Left 10-02-2010   dr Alvan Dame   subtrochanteric  . POLYPECTOMY  07/11/2018   Procedure: POLYPECTOMY;  Surgeon: Milus Banister, MD;  Location: [facility];  Service: Endoscopy;;  . TONSILLECTOMY  1940's  . TRANSTHORACIC ECHOCARDIOGRAM  05/01/2017   mild concentric LVH, ef 61-60%, grade 1 diastolic dysfunction/ trivial AR/ mild dilated ascending aorta/ mild to moderate MR (no  stenosis)/ mild LAE/ mild TR/ mild to moderate increase PASP 43mmHg    Social History   Socioeconomic History  . Marital status: Married    Spouse name: Not on file  . Number of children: 3  . Years of education: Not on file  . Highest education level: Not on file  Occupational History  . Occupation: Retired    Fish farm manager: RETIRED    Comment: Dance movement psychotherapist  Tobacco Use  . Smoking status: Former Smoker    Packs/day: 1.00    Years: 20.00    Pack years: 20.00    Types: Cigarettes    Quit date: 12/23/1971    Years since quitting: 48.2  . Smokeless tobacco: Never Used  Vaping Use  . Vaping Use: Never used  Substance and Sexual Activity  . Alcohol use: Yes    Alcohol/week: 14.0 standard drinks    Types: 14 Cans of beer per week    Comment: 2 beer daily  . Drug use: No  . Sexual activity: Yes  Other Topics Concern  . Not on file  Social History Narrative   Originally from [place].  Retired to [employer] because he was tired of all the snow.   Social Determinants of Health   Financial Resource Strain:   . Difficulty of Paying Living Expenses: Not on file  Food Insecurity:   . Worried About Charity fundraiser in the Last Year: Not on file  . Ran Out of Food in the Last Year: Not on file  Transportation Needs:   . Lack of Transportation (Medical): Not on file  . Lack of Transportation (Non-Medical): Not on file  Physical Activity:   . Days of Exercise per Week: Not on file  . Minutes of Exercise per Session: Not on file  Stress:   . Feeling of Stress : Not on file  Social Connections:   . Frequency of Communication with Friends and Family: Not on file  . Frequency of Social Gatherings with Friends and Family: Not on file  . Attends Religious Services: Not on file  . Active Member of Clubs or Organizations: Not on file  . Attends Archivist Meetings: Not on file  . Marital Status: Not on file  Intimate Partner Violence:   . Fear of Current or  Ex-Partner: Not on file  . Emotionally Abused: Not on file  . Physically Abused: Not on file  . Sexually Abused: Not on file    Family History  Problem Relation Age of Onset  . Macular degeneration Mother   . Osteoarthritis Mother   . Aneurysm Father        d/o 17 yo, ruptured AAA  . Heart disease Other        No family history  . Colon cancer Neg Hx   . Colon polyps Neg Hx   . Diabetes Neg Hx     ROS: no fevers or chills, productive cough, hemoptysis, dysphasia, odynophagia, melena, hematochezia, dysuria, hematuria, rash, seizure activity, orthopnea, PND, pedal edema, claudication. Remaining systems are negative.  Physical Exam: Well-developed well-nourished in no acute distress.  Skin is warm and dry.  HEENT is normal.  Neck is supple.  Chest is clear to auscultation with normal expansion.  Cardiovascular exam is regular rate and rhythm.  Abdominal exam nontender or distended. No masses palpated. Extremities show no edema. neuro grossly intact  ECG-sinus rhythm with occasional PVCs, right bundle branch block, left anterior fascicular block.  Personally reviewed  A/P  1 coronary artery disease-patient denies chest pain.  Plan to continue medical therapy.  Note he declines statins.  He is not on aspirin given need for Xarelto.  2 abdominal aortic aneurysm-plan follow-up abdominal ultrasound July 2022.  3 prior pulmonary emboli-given recurrent emboli patient is on lifelong anticoagulation.  Continue Xarelto.  4 hypertension-blood pressure elevated; increase Benicar to 40 mg daily and follow.  Check potassium and renal function in 1 week.  5 dyspnea-this is felt to be multifactorial including deconditioning, prior pulmonary emboli and restrictive lung disease.  Kirk Ruths, MD

## 2020-03-22 DIAGNOSIS — L720 Epidermal cyst: Secondary | ICD-10-CM | POA: Diagnosis not present

## 2020-03-22 DIAGNOSIS — M545 Low back pain: Secondary | ICD-10-CM | POA: Diagnosis not present

## 2020-03-27 ENCOUNTER — Other Ambulatory Visit: Payer: Self-pay

## 2020-03-27 ENCOUNTER — Ambulatory Visit (INDEPENDENT_AMBULATORY_CARE_PROVIDER_SITE_OTHER): Payer: PPO | Admitting: Cardiology

## 2020-03-27 ENCOUNTER — Encounter: Payer: Self-pay | Admitting: Cardiology

## 2020-03-27 VITALS — BP 173/95 | HR 86 | Ht 72.0 in | Wt 219.2 lb

## 2020-03-27 DIAGNOSIS — I714 Abdominal aortic aneurysm, without rupture, unspecified: Secondary | ICD-10-CM

## 2020-03-27 DIAGNOSIS — I2510 Atherosclerotic heart disease of native coronary artery without angina pectoris: Secondary | ICD-10-CM | POA: Diagnosis not present

## 2020-03-27 DIAGNOSIS — I10 Essential (primary) hypertension: Secondary | ICD-10-CM | POA: Diagnosis not present

## 2020-03-27 MED ORDER — OLMESARTAN MEDOXOMIL 40 MG PO TABS: 40.0000 mg | ORAL_TABLET | Freq: Every day | ORAL | 3 refills | Status: DC

## 2020-03-27 NOTE — Patient Instructions (Signed)
Medication Instructions:   INCREASE OLMESARTAN TO 40 MG ONCE DAILY= 2 OF THE 20 MG TABLETS ONCE DAILY  *If you need a refill on your cardiac medications before your next appointment, please call your pharmacy*   Lab Work:  Your physician recommends that you return for lab work in: [place]  If you have labs (blood work) drawn today and your tests are completely normal, you will receive your results only by: Marland Kitchen MyChart Message (if you have MyChart) OR . A paper copy in the mail If you have any lab test that is abnormal or we need to change your treatment, we will call you to review the results.   Follow-Up: At [facility], you and your health needs are our priority.  As part of our continuing mission to provide you with exceptional heart care, we have created designated Provider Care Teams.  These Care Teams include your primary Cardiologist (physician) and Advanced Practice Providers (APPs -  Physician Assistants and Nurse Practitioners) who all work together to provide you with the care you need, when you need it.  We recommend signing up for the patient portal called "MyChart".  Sign up information is provided on this After Visit Summary.  MyChart is used to connect with patients for Virtual Visits (Telemedicine).  Patients are able to view lab/test results, encounter notes, upcoming appointments, etc.  Non-urgent messages can be sent to your provider as well.   To learn more about what you can do with MyChart, go to [email or web address].    Your next appointment:   12 month(s)  The format for your next appointment:   In Person  Provider:   You may see Kirk Ruths, MD or one of the following Advanced Practice Providers on your designated Care Team:    Kerin Ransom, PA-C  [place], [place]  Coletta Memos, [place]

## 2020-03-28 ENCOUNTER — Ambulatory Visit: Payer: Self-pay | Attending: Internal Medicine

## 2020-03-28 DIAGNOSIS — Z23 Encounter for immunization: Secondary | ICD-10-CM

## 2020-03-28 NOTE — Progress Notes (Signed)
   Covid-19 Vaccination Clinic  Name:  [name]    MRN: [id number] DOB: [birth year]  03/28/2020  [name] was observed post Covid-19 immunization for 15 minutes without incident. He was provided with Vaccine Information Sheet and instruction to access the V-Safe system.   [name] was instructed to call 911 with any severe reactions post vaccine: Marland Kitchen Difficulty breathing  . Swelling of face and throat  . A fast heartbeat  . A bad rash all over body  . Dizziness and weakness

## 2020-03-31 DIAGNOSIS — I10 Essential (primary) hypertension: Secondary | ICD-10-CM | POA: Diagnosis not present

## 2020-04-01 LAB — BASIC METABOLIC PANEL
BUN/Creatinine Ratio: 22 (ref 10–24)
BUN: 21 mg/dL (ref 8–27)
CO2: 22 mmol/L (ref 20–29)
Calcium: 8.6 mg/dL (ref 8.6–10.2)
Chloride: 105 mmol/L (ref 96–106)
Creatinine, Ser: 0.96 mg/dL (ref 0.76–1.27)
GFR calc Af Amer: 86 mL/min/{1.73_m2} (ref >59)
GFR calc non Af Amer: 74 mL/min/{1.73_m2} (ref >59)
Glucose: 74 mg/dL (ref 65–99)
Potassium: 4.6 mmol/L (ref 3.5–5.2)
Sodium: 140 mmol/L (ref 134–144)

## 2020-04-14 ENCOUNTER — Other Ambulatory Visit: Payer: Self-pay | Admitting: Cardiology

## 2020-04-14 DIAGNOSIS — I10 Essential (primary) hypertension: Secondary | ICD-10-CM

## 2020-05-24 ENCOUNTER — Other Ambulatory Visit: Payer: Self-pay

## 2020-05-24 MED ORDER — OMEPRAZOLE 20 MG PO CPDR: 20.0000 mg | DELAYED_RELEASE_CAPSULE | Freq: Every day | ORAL | 1 refills | Status: DC

## 2020-06-21 DIAGNOSIS — G894 Chronic pain syndrome: Secondary | ICD-10-CM | POA: Diagnosis not present

## 2020-06-21 DIAGNOSIS — Z6834 Body mass index (BMI) 34.0-34.9, adult: Secondary | ICD-10-CM | POA: Diagnosis not present

## 2020-06-21 DIAGNOSIS — L720 Epidermal cyst: Secondary | ICD-10-CM | POA: Diagnosis not present

## 2020-06-21 DIAGNOSIS — M47817 Spondylosis without myelopathy or radiculopathy, lumbosacral region: Secondary | ICD-10-CM | POA: Diagnosis not present

## 2020-07-14 ENCOUNTER — Ambulatory Visit: Payer: PPO | Admitting: Gastroenterology

## 2020-08-01 ENCOUNTER — Other Ambulatory Visit: Payer: Self-pay | Admitting: Cardiology

## 2020-08-02 ENCOUNTER — Other Ambulatory Visit: Payer: Self-pay

## 2020-08-02 MED ORDER — OMEPRAZOLE 20 MG PO CPDR: 20.0000 mg | DELAYED_RELEASE_CAPSULE | Freq: Every day | ORAL | 1 refills | Status: DC

## 2020-09-21 ENCOUNTER — Ambulatory Visit: Payer: PPO | Admitting: Gastroenterology

## 2021-01-12 ENCOUNTER — Other Ambulatory Visit: Payer: Self-pay | Admitting: Cardiology

## 2021-01-12 NOTE — Telephone Encounter (Signed)
Prescription refill request for Xarelto received.  Indication:DVT Last office visit:10/21 Weight:99.4 KG Age:80 Scr:0.9 CrCl:90.5 ml/min  Prescription refilled

## 2021-01-12 NOTE — Telephone Encounter (Signed)
Prescription refill request for Xarelto received.  Indication: PE and DVT  Last office visit: 03/27/20 (Dr Stanford Breed) Weight: 99.4kg Age: 80 Scr: 0.96 03/31/20 CrCl: 85

## 2021-01-14 ENCOUNTER — Other Ambulatory Visit: Payer: Self-pay | Admitting: Cardiology

## 2021-01-14 DIAGNOSIS — I10 Essential (primary) hypertension: Secondary | ICD-10-CM

## 2021-02-06 DIAGNOSIS — Z961 Presence of intraocular lens: Secondary | ICD-10-CM | POA: Diagnosis not present

## 2021-02-06 DIAGNOSIS — H524 Presbyopia: Secondary | ICD-10-CM | POA: Diagnosis not present

## 2021-02-06 DIAGNOSIS — H52203 Unspecified astigmatism, bilateral: Secondary | ICD-10-CM | POA: Diagnosis not present

## 2021-02-20 DIAGNOSIS — J439 Emphysema, unspecified: Secondary | ICD-10-CM | POA: Diagnosis not present

## 2021-02-20 DIAGNOSIS — D509 Iron deficiency anemia, unspecified: Secondary | ICD-10-CM | POA: Diagnosis not present

## 2021-02-20 DIAGNOSIS — I714 Abdominal aortic aneurysm, without rupture: Secondary | ICD-10-CM | POA: Diagnosis not present

## 2021-02-20 DIAGNOSIS — M1A9XX0 Chronic gout, unspecified, without tophus (tophi): Secondary | ICD-10-CM | POA: Diagnosis not present

## 2021-02-20 DIAGNOSIS — C61 Malignant neoplasm of prostate: Secondary | ICD-10-CM | POA: Diagnosis not present

## 2021-02-20 DIAGNOSIS — Z0000 Encounter for general adult medical examination without abnormal findings: Secondary | ICD-10-CM | POA: Diagnosis not present

## 2021-02-20 DIAGNOSIS — K2270 Barrett's esophagus without dysplasia: Secondary | ICD-10-CM | POA: Diagnosis not present

## 2021-02-20 DIAGNOSIS — I2510 Atherosclerotic heart disease of native coronary artery without angina pectoris: Secondary | ICD-10-CM | POA: Diagnosis not present

## 2021-02-20 DIAGNOSIS — I10 Essential (primary) hypertension: Secondary | ICD-10-CM | POA: Diagnosis not present

## 2021-02-20 DIAGNOSIS — L570 Actinic keratosis: Secondary | ICD-10-CM | POA: Diagnosis not present

## 2021-02-20 DIAGNOSIS — S32010D Wedge compression fracture of first lumbar vertebra, subsequent encounter for fracture with routine healing: Secondary | ICD-10-CM | POA: Diagnosis not present

## 2021-03-02 DIAGNOSIS — Z23 Encounter for immunization: Secondary | ICD-10-CM | POA: Diagnosis not present

## 2021-03-12 DIAGNOSIS — Z8546 Personal history of malignant neoplasm of prostate: Secondary | ICD-10-CM | POA: Diagnosis not present

## 2021-03-12 DIAGNOSIS — R3912 Poor urinary stream: Secondary | ICD-10-CM | POA: Diagnosis not present

## 2021-03-12 DIAGNOSIS — N3040 Irradiation cystitis without hematuria: Secondary | ICD-10-CM | POA: Diagnosis not present

## 2021-03-12 DIAGNOSIS — N5201 Erectile dysfunction due to arterial insufficiency: Secondary | ICD-10-CM | POA: Diagnosis not present

## 2021-04-04 NOTE — Progress Notes (Signed)
HPI: FU CAD. Pt with h/o unexplained pulmonary embolus years ago treated with one year of Coumadin. Patient was admitted in October of 2013 and found to have a subdural hematoma which required evacuation. Seen for dyspnea 10/15. Cardiac catheterization October 2015 showed an 80% second diagonal but otherwise nonobstructive coronary disease. Ejection fraction 55-65%. Chest CT October 2015 showed bilateral pulmonary emboli. Echocardiogram May 2019 showed normal LV function and mild mitral regurgitation. Patient admitted January 2020 with dyspnea and hemoglobin 5.9. Patient was transfused. He had EGD and colonoscopy that revealed Barrett's esophagus, hiatal hernia, diverticulosis, internal hemorrhoids and radiation proctitis.  Xarelto ultimately resumed. Abdominal ultrasound July 2020 showed 3.2 cm abdominal aortic aneurysm and follow-up recommended 24 months. Since I last saw him, there is no dyspnea, chest pain, palpitations or syncope.  Current Outpatient Medications  Medication Sig Dispense Refill   allopurinol (ZYLOPRIM) 300 MG tablet Take 300 mg by mouth every morning.      olmesartan (BENICAR) 40 MG tablet Take 1 tablet (40 mg total) by mouth daily. 90 tablet 3   omeprazole (PRILOSEC) 20 MG capsule Take 1 capsule (20 mg total) by mouth daily. Please keep your March Appointment for further refills. Thank you 30 capsule 1   XARELTO 20 MG TABS tablet TAKE 1 TABLET(20 MG) BY MOUTH DAILY WITH SUPPER 90 tablet 1   ferrous sulfate 325 (65 FE) MG tablet Take 1 tablet (325 mg total) by mouth daily for 30 days. 30 tablet 0   HYDROcodone-acetaminophen (NORCO/VICODIN) 5-325 MG tablet Take 1 tablet by mouth 2 (two) times daily. (Patient not taking: Reported on 04/10/2021)     olmesartan (BENICAR) 20 MG tablet TAKE 1 TABLET(20 MG) BY MOUTH DAILY (Patient not taking: Reported on 04/10/2021) 90 tablet 3   No current facility-administered medications for this visit.     Past Medical History:  Diagnosis  Date   AAA (abdominal aortic aneurysm) followed by dr Stanford Breed   last duplex 07-17-2017  3.2 x 3.3   Anticoagulant long-term use    xarelto for hx PEs and DVT   Arthritis    back   Barrett's esophagus 2009   Bilateral lower extremity edema    chronic pedal   CAD (coronary artery disease) cardioloigst--  dr Stanford Breed   cardiac cath 04-12-2014  single vessel obstructive cad involving ostrium D2, and other nonobstructive cad , normall lvf   Chronic lower back pain    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[place])    per duplex 07-17-2017  left cia 2 x 2, right cia 2.1 x 2   Diverticulosis    Dyspnea    with exertion    Dyspnea on exertion    FH: colonic polyps    GERD (gastroesophageal reflux disease)    Gout    08-27-2017  per pt stable   History of adenomatous polyp of colon    History of DVT of lower extremity    10/ 2015  LLE   History of esophageal stricture    post dilation   History of gastric polyp    benign   History of pulmonary embolus (PE)    2005  treated 1 yr w/ coumadin and 10/ 2015  bilateral   History of radiation therapy 06/22/2012-08/18/2012   78 Gy to prostate (external beam)   History of subdural hematoma 03/2012   s/p  evacuation   Hypertension    Lesion of bladder    non canerous   Prostate cancer [facility]) urologist-  dr wrenn/  oncologist- dr Sondra Come   dx 07/ 2013--  Stage T1c, Gleson 7, vol 67cc--- completed external beam radiation therapy 08-18-2012   Pulmonary HTN ([place]) 04/12/2014   last echo 05-01-2017  PASP 65mmHg   RBBB (right bundle branch block with left anterior fascicular block)    ocassional    Past Surgical History:  Procedure Laterality Date   BIOPSY  07/11/2018   Procedure: BIOPSY;  Surgeon: Milus Banister, MD;  [facility]: [facility];  Service: Endoscopy;;   CATARACT EXTRACTION W/ INTRAOCULAR LENS  IMPLANT, BILATERAL  2018   COLONOSCOPY WITH PROPOFOL N/A 04/25/2015   Procedure: COLONOSCOPY WITH PROPOFOL;  Surgeon: Manus Gunning, MD;  [facility];  Service: Gastroenterology;  Laterality: N/A;   COLONOSCOPY WITH PROPOFOL N/A 07/11/2018   Procedure: COLONOSCOPY WITH PROPOFOL;  Surgeon: Milus Banister, MD;  [facility];  Service: Endoscopy;  Laterality: N/A;   CRANIOTOMY  04/03/2012   Procedure: CRANIOTOMY HEMATOMA EVACUATION SUBDURAL;  Surgeon: Erline Levine, MD;  Location: [place] [facility];  Service: Neurosurgery;  Laterality: Left;  Left Craniotomy for Evacuation of Subdural Hematoma   CYSTOSCOPY WITH BIOPSY N/A 09/02/2017   Procedure: CYSTOSCOPY WITH BIOPSY AND FULGURATION POSSIBLE TRANSURETHRAL RESECTION OF BLADDER TUMOR;  Surgeon: [name], MD;  Location: [facility];  Service: Urology;  Laterality: N/A;   ESOPHAGOGASTRODUODENOSCOPY (EGD) WITH PROPOFOL N/A 04/25/2015   Procedure: ESOPHAGOGASTRODUODENOSCOPY (EGD) WITH PROPOFOL;  Surgeon: Manus Gunning, MD;  [facility];  Service: Gastroenterology;  Laterality: N/A;   ESOPHAGOGASTRODUODENOSCOPY (EGD) WITH PROPOFOL N/A 07/11/2018   Procedure: ESOPHAGOGASTRODUODENOSCOPY (EGD) WITH PROPOFOL;  Surgeon: Milus Banister, MD;  [facility];  Service: Endoscopy;  Laterality: N/A;   IRRIGATION AND DEBRIDEMENT SEBACEOUS CYST  01-11-13   "off my back"   KYPHOPLASTY N/A 12/23/2017   Procedure: Lumbar One KYPHOPLASTY;  Surgeon: Erline Levine, MD;  Location: [place];  Service: Neurosurgery;  Laterality: N/A;  Lumbar One KYPHOPLASTY   LEFT AND RIGHT HEART CATHETERIZATION WITH CORONARY ANGIOGRAM N/A 04/12/2014   Procedure: LEFT AND RIGHT HEART CATHETERIZATION WITH CORONARY ANGIOGRAM;  Surgeon: Peter M Martinique, MD;  Location: [facility] CATH LAB;  Service: Cardiovascular;  Laterality: N/A;   ORIF FEMUR FRACTURE Left 10-02-2010   dr Alvan Dame   subtrochanteric   POLYPECTOMY  07/11/2018   Procedure: POLYPECTOMY;  Surgeon: Milus Banister, MD;  [facility];  Service: Endoscopy;;   TONSILLECTOMY  1940's   TRANSTHORACIC ECHOCARDIOGRAM  05/01/2017    mild concentric LVH, ef 09-98%, grade 1 diastolic dysfunction/ trivial AR/ mild dilated ascending aorta/ mild to moderate MR (no stenosis)/ mild LAE/ mild TR/ mild to moderate increase PASP 64mmHg    Social History   Socioeconomic History   Marital status: Married    Spouse name: Not on file   Number of children: 3   Years of education: Not on file   Highest education level: Not on file  Occupational History   Occupation: Retired    Fish farm manager: RETIRED    Comment: Dance movement psychotherapist  Tobacco Use   Smoking status: Former    Packs/day: 1.00    Years: 20.00    Pack years: 20.00    Types: Cigarettes    Quit date: 12/23/1971    Years since quitting: 49.3   Smokeless tobacco: Never  Vaping Use   Vaping Use: Never used  Substance and Sexual Activity   Alcohol use: Yes    Alcohol/week: 14.0 standard drinks    Types: 14 Cans of beer per week  Comment: 2 beer daily   Drug use: No   Sexual activity: Yes  Other Topics Concern   Not on file  Social History Narrative   Originally from [place].  Retired to [employer] because he was tired of all the snow.   Social Determinants of Health   Financial Resource Strain: Not on file  Food Insecurity: Not on file  Transportation Needs: Not on file  Physical Activity: Not on file  Stress: Not on file  Social Connections: Not on file  Intimate Partner Violence: Not on file    Family History  Problem Relation Age of Onset   Macular degeneration Mother    Osteoarthritis Mother    Aneurysm Father        d/o 36 yo, ruptured AAA   Heart disease Other        No family history   Colon cancer Neg Hx    Colon polyps Neg Hx    Diabetes Neg Hx     ROS: Back pain but no fevers or chills, productive cough, hemoptysis, dysphasia, odynophagia, melena, hematochezia, dysuria, hematuria, rash, seizure activity, orthopnea, PND, pedal edema, claudication. Remaining systems are negative.  Physical Exam: Well-developed well-nourished in no acute  distress.  Skin is warm and dry.  HEENT is normal.  Neck is supple.  Chest is clear to auscultation with normal expansion.  Cardiovascular exam is regular rate and rhythm.  Abdominal exam nontender or distended. No masses palpated. Extremities show trace edema. neuro grossly intact  ECG-sinus tachycardia with occasional PVC, left anterior fascicular block, right bundle branch block.  Personally reviewed  A/P  1 coronary artery disease-patient doing well with no chest pain.  He is not on aspirin given need for anticoagulation.  He has declined statins previously.  2 abdominal aortic aneurysm-we will arrange follow-up ultrasound.  3 history of pulmonary emboli-given that these have been recurrent he is on lifelong anticoagulation.  We will continue Xarelto.  4 hypertension-blood pressure controlled.  Continue present medications.  5 chronic dyspnea-felt to be multifactorial including deconditioning, previous pulmonary embolus and restrictive lung disease.  Kirk Ruths, MD

## 2021-04-10 ENCOUNTER — Other Ambulatory Visit: Payer: Self-pay

## 2021-04-10 ENCOUNTER — Encounter: Payer: Self-pay | Admitting: Cardiology

## 2021-04-10 ENCOUNTER — Ambulatory Visit: Payer: PPO | Admitting: Cardiology

## 2021-04-10 VITALS — BP 135/73 | HR 104 | Ht 72.0 in | Wt 215.0 lb

## 2021-04-10 DIAGNOSIS — I714 Abdominal aortic aneurysm, without rupture, unspecified: Secondary | ICD-10-CM | POA: Diagnosis not present

## 2021-04-10 DIAGNOSIS — I2510 Atherosclerotic heart disease of native coronary artery without angina pectoris: Secondary | ICD-10-CM | POA: Diagnosis not present

## 2021-04-10 DIAGNOSIS — I2699 Other pulmonary embolism without acute cor pulmonale: Secondary | ICD-10-CM | POA: Diagnosis not present

## 2021-04-10 DIAGNOSIS — I10 Essential (primary) hypertension: Secondary | ICD-10-CM

## 2021-04-10 NOTE — Patient Instructions (Signed)
  Testing/Procedures:  Your physician has requested that you have an abdominal aorta duplex. During this test, an ultrasound is used to evaluate the aorta. Allow 30 minutes for this exam. Do not eat after midnight the day before and avoid carbonated beverages [facility]   Follow-Up: At [facility], you and your health needs are our priority.  As part of our continuing mission to provide you with exceptional heart care, we have created designated Provider Care Teams.  These Care Teams include your primary Cardiologist (physician) and Advanced Practice Providers (APPs -  Physician Assistants and Nurse Practitioners) who all work together to provide you with the care you need, when you need it.  We recommend signing up for the patient portal called "MyChart".  Sign up information is provided on this After Visit Summary.  MyChart is used to connect with patients for Virtual Visits (Telemedicine).  Patients are able to view lab/test results, encounter notes, upcoming appointments, etc.  Non-urgent messages can be sent to your provider as well.   To learn more about what you can do with MyChart, go to [email or web address].    Your next appointment:   12 month(s)  The format for your next appointment:   In Person  Provider:   Kirk Ruths, MD

## 2021-04-18 ENCOUNTER — Ambulatory Visit (HOSPITAL_COMMUNITY)
Admission: RE | Admit: 2021-04-18 | Discharge: 2021-04-18 | Disposition: A | Discharge status: Home / Self Care | Payer: PPO | Source: Ambulatory Visit | Attending: Cardiology | Admitting: Cardiology

## 2021-04-18 ENCOUNTER — Other Ambulatory Visit: Payer: Self-pay

## 2021-04-18 DIAGNOSIS — I7143 Infrarenal abdominal aortic aneurysm, without rupture: Secondary | ICD-10-CM

## 2021-04-18 DIAGNOSIS — I714 Abdominal aortic aneurysm, without rupture, unspecified: Secondary | ICD-10-CM | POA: Diagnosis not present

## 2021-05-10 ENCOUNTER — Other Ambulatory Visit (HOSPITAL_BASED_OUTPATIENT_CLINIC_OR_DEPARTMENT_OTHER): Payer: Self-pay

## 2021-05-10 ENCOUNTER — Ambulatory Visit: Payer: PPO | Attending: Internal Medicine

## 2021-05-10 DIAGNOSIS — Z23 Encounter for immunization: Secondary | ICD-10-CM

## 2021-05-10 MED ORDER — PFIZER COVID-19 VAC BIVALENT 30 MCG/0.3ML IM SUSP
INTRAMUSCULAR | 0 refills | Status: DC
  Filled 2021-05-10: qty 0.3, 1d supply, fill #0

## 2021-05-10 NOTE — Progress Notes (Signed)
   Covid-19 Vaccination Clinic  Name:  [name]    MRN: [id number] DOB: 1941-03-13  05/10/2021  [name] was observed post Covid-19 immunization for 15 minutes without incident. He was provided with Vaccine Information Sheet and instruction to access the V-Safe system.   [name] was instructed to call 911 with any severe reactions post vaccine: Difficulty breathing  Swelling of face and throat  A fast heartbeat  A bad rash all over body  Dizziness and weakness   Immunizations Administered     Name Date Dose VIS Date Route   Pfizer Covid-19 Vaccine Bivalent Booster 05/10/2021  2:03 PM 0.3 mL 02/21/2021 Intramuscular   Manufacturer: [place]   Lot: [id number]   [place]: [phone]

## 2021-07-24 DIAGNOSIS — D179 Benign lipomatous neoplasm, unspecified: Secondary | ICD-10-CM | POA: Diagnosis not present

## 2021-07-24 DIAGNOSIS — T148XXA Other injury of unspecified body region, initial encounter: Secondary | ICD-10-CM | POA: Diagnosis not present

## 2021-08-24 ENCOUNTER — Inpatient Hospital Stay (HOSPITAL_COMMUNITY)
Admission: EM | Admit: 2021-08-24 | Discharge: 2021-08-30 | DRG: 871 | Disposition: A | Discharge status: Skilled Nursing Facility | Payer: PPO | Attending: Internal Medicine | Admitting: Internal Medicine

## 2021-08-24 ENCOUNTER — Other Ambulatory Visit: Payer: Self-pay

## 2021-08-24 ENCOUNTER — Emergency Department (HOSPITAL_COMMUNITY): Payer: PPO

## 2021-08-24 ENCOUNTER — Encounter (HOSPITAL_COMMUNITY): Payer: Self-pay

## 2021-08-24 DIAGNOSIS — I5033 Acute on chronic diastolic (congestive) heart failure: Secondary | ICD-10-CM | POA: Diagnosis present

## 2021-08-24 DIAGNOSIS — K2270 Barrett's esophagus without dysplasia: Secondary | ICD-10-CM | POA: Diagnosis present

## 2021-08-24 DIAGNOSIS — E559 Vitamin D deficiency, unspecified: Secondary | ICD-10-CM | POA: Diagnosis present

## 2021-08-24 DIAGNOSIS — L8990 Pressure ulcer of unspecified site, unspecified stage: Secondary | ICD-10-CM | POA: Diagnosis present

## 2021-08-24 DIAGNOSIS — I272 Pulmonary hypertension, unspecified: Secondary | ICD-10-CM | POA: Diagnosis present

## 2021-08-24 DIAGNOSIS — J690 Pneumonitis due to inhalation of food and vomit: Secondary | ICD-10-CM | POA: Diagnosis not present

## 2021-08-24 DIAGNOSIS — I110 Hypertensive heart disease with heart failure: Secondary | ICD-10-CM | POA: Diagnosis present

## 2021-08-24 DIAGNOSIS — Z961 Presence of intraocular lens: Secondary | ICD-10-CM | POA: Diagnosis present

## 2021-08-24 DIAGNOSIS — E871 Hypo-osmolality and hyponatremia: Secondary | ICD-10-CM | POA: Diagnosis present

## 2021-08-24 DIAGNOSIS — J189 Pneumonia, unspecified organism: Secondary | ICD-10-CM

## 2021-08-24 DIAGNOSIS — Z8546 Personal history of malignant neoplasm of prostate: Secondary | ICD-10-CM

## 2021-08-24 DIAGNOSIS — L89899 Pressure ulcer of other site, unspecified stage: Secondary | ICD-10-CM | POA: Diagnosis not present

## 2021-08-24 DIAGNOSIS — I723 Aneurysm of iliac artery: Secondary | ICD-10-CM | POA: Diagnosis present

## 2021-08-24 DIAGNOSIS — R000 Tachycardia, unspecified: Secondary | ICD-10-CM | POA: Diagnosis not present

## 2021-08-24 DIAGNOSIS — I714 Abdominal aortic aneurysm, without rupture, unspecified: Secondary | ICD-10-CM | POA: Diagnosis present

## 2021-08-24 DIAGNOSIS — M6281 Muscle weakness (generalized): Secondary | ICD-10-CM | POA: Diagnosis not present

## 2021-08-24 DIAGNOSIS — A419 Sepsis, unspecified organism: Principal | ICD-10-CM | POA: Diagnosis present

## 2021-08-24 DIAGNOSIS — L89102 Pressure ulcer of unspecified part of back, stage 2: Secondary | ICD-10-CM | POA: Diagnosis not present

## 2021-08-24 DIAGNOSIS — I2699 Other pulmonary embolism without acute cor pulmonale: Secondary | ICD-10-CM | POA: Diagnosis not present

## 2021-08-24 DIAGNOSIS — I10 Essential (primary) hypertension: Secondary | ICD-10-CM | POA: Diagnosis not present

## 2021-08-24 DIAGNOSIS — L8942 Pressure ulcer of contiguous site of back, buttock and hip, stage 2: Secondary | ICD-10-CM | POA: Diagnosis not present

## 2021-08-24 DIAGNOSIS — D649 Anemia, unspecified: Secondary | ICD-10-CM | POA: Diagnosis not present

## 2021-08-24 DIAGNOSIS — R1310 Dysphagia, unspecified: Secondary | ICD-10-CM | POA: Diagnosis not present

## 2021-08-24 DIAGNOSIS — D171 Benign lipomatous neoplasm of skin and subcutaneous tissue of trunk: Secondary | ICD-10-CM | POA: Diagnosis present

## 2021-08-24 DIAGNOSIS — Z86711 Personal history of pulmonary embolism: Secondary | ICD-10-CM

## 2021-08-24 DIAGNOSIS — G8929 Other chronic pain: Secondary | ICD-10-CM | POA: Diagnosis present

## 2021-08-24 DIAGNOSIS — I2510 Atherosclerotic heart disease of native coronary artery without angina pectoris: Secondary | ICD-10-CM | POA: Diagnosis present

## 2021-08-24 DIAGNOSIS — M1990 Unspecified osteoarthritis, unspecified site: Secondary | ICD-10-CM | POA: Diagnosis not present

## 2021-08-24 DIAGNOSIS — W06XXXA Fall from bed, initial encounter: Secondary | ICD-10-CM | POA: Diagnosis present

## 2021-08-24 DIAGNOSIS — R0689 Other abnormalities of breathing: Secondary | ICD-10-CM | POA: Diagnosis not present

## 2021-08-24 DIAGNOSIS — M545 Low back pain, unspecified: Secondary | ICD-10-CM | POA: Diagnosis present

## 2021-08-24 DIAGNOSIS — M6259 Muscle wasting and atrophy, not elsewhere classified, multiple sites: Secondary | ICD-10-CM | POA: Diagnosis not present

## 2021-08-24 DIAGNOSIS — R627 Adult failure to thrive: Secondary | ICD-10-CM | POA: Diagnosis present

## 2021-08-24 DIAGNOSIS — Z79891 Long term (current) use of opiate analgesic: Secondary | ICD-10-CM

## 2021-08-24 DIAGNOSIS — Z87891 Personal history of nicotine dependence: Secondary | ICD-10-CM

## 2021-08-24 DIAGNOSIS — W19XXXA Unspecified fall, initial encounter: Secondary | ICD-10-CM | POA: Insufficient documentation

## 2021-08-24 DIAGNOSIS — M109 Gout, unspecified: Secondary | ICD-10-CM | POA: Diagnosis present

## 2021-08-24 DIAGNOSIS — Z20822 Contact with and (suspected) exposure to covid-19: Secondary | ICD-10-CM | POA: Diagnosis present

## 2021-08-24 DIAGNOSIS — K219 Gastro-esophageal reflux disease without esophagitis: Secondary | ICD-10-CM | POA: Diagnosis not present

## 2021-08-24 DIAGNOSIS — R1312 Dysphagia, oropharyngeal phase: Secondary | ICD-10-CM | POA: Diagnosis not present

## 2021-08-24 DIAGNOSIS — D696 Thrombocytopenia, unspecified: Secondary | ICD-10-CM | POA: Diagnosis not present

## 2021-08-24 DIAGNOSIS — R296 Repeated falls: Secondary | ICD-10-CM | POA: Diagnosis not present

## 2021-08-24 DIAGNOSIS — Z8249 Family history of ischemic heart disease and other diseases of the circulatory system: Secondary | ICD-10-CM

## 2021-08-24 DIAGNOSIS — Z86718 Personal history of other venous thrombosis and embolism: Secondary | ICD-10-CM

## 2021-08-24 DIAGNOSIS — R531 Weakness: Secondary | ICD-10-CM | POA: Diagnosis not present

## 2021-08-24 DIAGNOSIS — R918 Other nonspecific abnormal finding of lung field: Secondary | ICD-10-CM | POA: Diagnosis not present

## 2021-08-24 DIAGNOSIS — Z792 Long term (current) use of antibiotics: Secondary | ICD-10-CM

## 2021-08-24 DIAGNOSIS — R0609 Other forms of dyspnea: Secondary | ICD-10-CM | POA: Diagnosis not present

## 2021-08-24 DIAGNOSIS — E876 Hypokalemia: Secondary | ICD-10-CM | POA: Diagnosis present

## 2021-08-24 DIAGNOSIS — Z743 Need for continuous supervision: Secondary | ICD-10-CM | POA: Diagnosis not present

## 2021-08-24 DIAGNOSIS — Z923 Personal history of irradiation: Secondary | ICD-10-CM

## 2021-08-24 DIAGNOSIS — R2681 Unsteadiness on feet: Secondary | ICD-10-CM | POA: Diagnosis not present

## 2021-08-24 DIAGNOSIS — Z79899 Other long term (current) drug therapy: Secondary | ICD-10-CM

## 2021-08-24 LAB — CBC WITH DIFFERENTIAL/PLATELET
Abs Immature Granulocytes: 0.22 10*3/uL — ABNORMAL HIGH (ref 0.00–0.07)
Basophils Absolute: 0 10*3/uL (ref 0.0–0.1)
Basophils Relative: 0 %
Eosinophils Absolute: 0 10*3/uL (ref 0.0–0.5)
Eosinophils Relative: 0 %
HCT: 37 % — ABNORMAL LOW (ref 39.0–52.0)
Hemoglobin: 12.4 g/dL — ABNORMAL LOW (ref 13.0–17.0)
Immature Granulocytes: 2 %
Lymphocytes Relative: 5 %
Lymphs Abs: 0.8 10*3/uL (ref 0.7–4.0)
MCH: 33.5 pg (ref 26.0–34.0)
MCHC: 33.5 g/dL (ref 30.0–36.0)
MCV: 100 fL (ref 80.0–100.0)
Monocytes Absolute: 1 10*3/uL (ref 0.1–1.0)
Monocytes Relative: 7 %
Neutro Abs: 12 10*3/uL — ABNORMAL HIGH (ref 1.7–7.7)
Neutrophils Relative %: 86 %
Platelets: 159 10*3/uL (ref 150–400)
RBC: 3.7 MIL/uL — ABNORMAL LOW (ref 4.22–5.81)
RDW: 13.1 % (ref 11.5–15.5)
WBC: 14 10*3/uL — ABNORMAL HIGH (ref 4.0–10.5)
nRBC: 0 % (ref 0.0–0.2)

## 2021-08-24 LAB — COMPREHENSIVE METABOLIC PANEL
ALT: 15 U/L (ref 0–44)
AST: 36 U/L (ref 15–41)
Albumin: 3.4 g/dL — ABNORMAL LOW (ref 3.5–5.0)
Alkaline Phosphatase: 47 U/L (ref 38–126)
Anion gap: 14 (ref 5–15)
BUN: 26 mg/dL — ABNORMAL HIGH (ref 8–23)
CO2: 17 mmol/L — ABNORMAL LOW (ref 22–32)
Calcium: 7.9 mg/dL — ABNORMAL LOW (ref 8.9–10.3)
Chloride: 99 mmol/L (ref 98–111)
Creatinine, Ser: 1.13 mg/dL (ref 0.61–1.24)
GFR, Estimated: >60 mL/min (ref >60)
Glucose, Bld: 134 mg/dL — ABNORMAL HIGH (ref 70–99)
Potassium: 4.3 mmol/L (ref 3.5–5.1)
Sodium: 130 mmol/L — ABNORMAL LOW (ref 135–145)
Total Bilirubin: 1.8 mg/dL — ABNORMAL HIGH (ref 0.3–1.2)
Total Protein: 6.9 g/dL (ref 6.5–8.1)

## 2021-08-24 LAB — URINALYSIS, ROUTINE W REFLEX MICROSCOPIC
Bacteria, UA: NONE SEEN
Bilirubin Urine: NEGATIVE
Glucose, UA: NEGATIVE mg/dL
Ketones, ur: 5 mg/dL — AB
Leukocytes,Ua: NEGATIVE
Nitrite: NEGATIVE
Protein, ur: 30 mg/dL — AB
Specific Gravity, Urine: 1.02 (ref 1.005–1.030)
pH: 5 (ref 5.0–8.0)

## 2021-08-24 LAB — LACTIC ACID, PLASMA
Lactic Acid, Venous: 2.9 mmol/L (ref 0.5–1.9)
Lactic Acid, Venous: 2 mmol/L (ref 0.5–1.9)

## 2021-08-24 LAB — RESP PANEL BY RT-PCR (FLU A&B, COVID) ARPGX2
Influenza A by PCR: NEGATIVE
Influenza B by PCR: NEGATIVE
SARS Coronavirus 2 by RT PCR: NEGATIVE

## 2021-08-24 MED ORDER — ONDANSETRON HCL 4 MG PO TABS: 4.0000 mg | ORAL_TABLET | Freq: Four times a day (QID) | ORAL | Status: DC | PRN

## 2021-08-24 MED ORDER — ACETAMINOPHEN 650 MG RE SUPP: 650.0000 mg | Freq: Four times a day (QID) | RECTAL | Status: DC | PRN

## 2021-08-24 MED ORDER — ENSURE ENLIVE PO LIQD
237.0000 mL | Freq: Two times a day (BID) | ORAL | Status: DC
  Administered 2021-08-25: 237 mL via ORAL

## 2021-08-24 MED ORDER — LACTATED RINGERS IV SOLN: INTRAVENOUS | Status: AC

## 2021-08-24 MED ORDER — SODIUM CHLORIDE 0.9 % IV SOLN
2.0000 g | INTRAVENOUS | Status: DC
  Administered 2021-08-24 – 2021-08-27 (×4): 2 g via INTRAVENOUS
  Filled 2021-08-24 (×4): qty 20

## 2021-08-24 MED ORDER — LACTATED RINGERS IV BOLUS (SEPSIS)
1000.0000 mL | Freq: Once | INTRAVENOUS | Status: AC
  Administered 2021-08-24: 1000 mL via INTRAVENOUS

## 2021-08-24 MED ORDER — ONDANSETRON HCL 4 MG/2ML IJ SOLN: 4.0000 mg | Freq: Four times a day (QID) | INTRAMUSCULAR | Status: DC | PRN

## 2021-08-24 MED ORDER — SODIUM CHLORIDE 0.9 % IV SOLN
500.0000 mg | INTRAVENOUS | Status: DC
  Administered 2021-08-24 – 2021-08-27 (×4): 500 mg via INTRAVENOUS
  Filled 2021-08-24 (×4): qty 5

## 2021-08-24 MED ORDER — ACETAMINOPHEN 325 MG PO TABS: 650.0000 mg | ORAL_TABLET | Freq: Four times a day (QID) | ORAL | Status: DC | PRN

## 2021-08-24 MED ORDER — ORAL CARE MOUTH RINSE
15.0000 mL | Freq: Two times a day (BID) | OROMUCOSAL | Status: DC
  Administered 2021-08-25 – 2021-08-30 (×11): 15 mL via OROMUCOSAL

## 2021-08-24 MED ORDER — LEVALBUTEROL HCL 0.63 MG/3ML IN NEBU
0.6300 mg | INHALATION_SOLUTION | Freq: Three times a day (TID) | RESPIRATORY_TRACT | Status: DC | PRN
  Administered 2021-08-24: 0.63 mg via RESPIRATORY_TRACT
  Filled 2021-08-24: qty 3

## 2021-08-24 MED ORDER — LACTATED RINGERS IV SOLN: INTRAVENOUS | Status: DC

## 2021-08-24 NOTE — H&P (Signed)
History and Physical    Patient: [name] DPO:[id number] DOB: 1941/02/18 DOA: 08/24/2021 DOS: the patient was seen and examined on 08/24/2021 PCP: Deland Pretty, MD  Patient coming from: Home  Chief Complaint:  Chief Complaint  Patient presents with   Code Sepsis    HPI: [name] is a 81 y.o. male with medical history significant of HTN, AAA, PTE on xarelto, Barrett's esophagus. Presenting with fever and weakness for the last few days. His wife reports that she has noticed that he has generally been more weak for the last 4 days or so. She has noticed a decrease in appetite and energy. She has seen temperatures at home of up to 101. He has had multiple falls during that time.  This morning he had a fall out of a recliner this morning. There was no head injury or LOC. She became concerned and called for EMS. They deny any other aggravating or alleviating factors.    Review of Systems: As mentioned in the history of present illness. All other systems reviewed and are negative. Past Medical History:  Diagnosis Date   AAA (abdominal aortic aneurysm) followed by dr Stanford Breed   last duplex 07-17-2017  3.2 x 3.3   Anticoagulant long-term use    xarelto for hx PEs and DVT   Arthritis    back   Barrett's esophagus 2009   Bilateral lower extremity edema    chronic pedal   CAD (coronary artery disease) cardioloigst--  dr Stanford Breed   cardiac cath 04-12-2014  single vessel obstructive cad involving ostrium D2, and other nonobstructive cad , normall lvf   Chronic lower back pain    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[place])    per duplex 07-17-2017  left cia 2 x 2, right cia 2.1 x 2   Diverticulosis    Dyspnea    with exertion    Dyspnea on exertion    FH: colonic polyps    GERD (gastroesophageal reflux disease)    Gout    08-27-2017  per pt stable   History of adenomatous polyp of colon    History of DVT of lower extremity    10/ 2015  LLE   History of esophageal stricture    post dilation    History of gastric polyp    benign   History of pulmonary embolus (PE)    2005  treated 1 yr w/ coumadin and 10/ 2015  bilateral   History of radiation therapy 06/22/2012-08/18/2012   78 Gy to prostate (external beam)   History of subdural hematoma 03/2012   s/p  evacuation   Hypertension    Lesion of bladder    non canerous   Prostate cancer [facility]) urologist-  dr wrenn/ oncologist- dr Sondra Come   dx 07/ 2013--  Stage T1c, Gleson 7, vol 67cc--- completed external beam radiation therapy 08-18-2012   Pulmonary HTN ([place]) 04/12/2014   last echo 05-01-2017  PASP 33mmHg   RBBB (right bundle branch block with left anterior fascicular block)    ocassional   Past Surgical History:  Procedure Laterality Date   BIOPSY  07/11/2018   Procedure: BIOPSY;  Surgeon: Milus Banister, MD;  [facility];  Service: Endoscopy;;   CATARACT EXTRACTION W/ INTRAOCULAR LENS  IMPLANT, BILATERAL  2018   COLONOSCOPY WITH PROPOFOL N/A 04/25/2015   Procedure: COLONOSCOPY WITH PROPOFOL;  Surgeon: Manus Gunning, MD;  [facility];  Service: Gastroenterology;  Laterality: N/A;   COLONOSCOPY WITH PROPOFOL N/A 07/11/2018   Procedure: COLONOSCOPY  WITH PROPOFOL;  Surgeon: Milus Banister, MD;  [facility];  Service: Endoscopy;  Laterality: N/A;   CRANIOTOMY  04/03/2012   Procedure: CRANIOTOMY HEMATOMA EVACUATION SUBDURAL;  Surgeon: Erline Levine, MD;  Location: [place] [facility];  Service: Neurosurgery;  Laterality: Left;  Left Craniotomy for Evacuation of Subdural Hematoma   CYSTOSCOPY WITH BIOPSY N/A 09/02/2017   Procedure: CYSTOSCOPY WITH BIOPSY AND FULGURATION POSSIBLE TRANSURETHRAL RESECTION OF BLADDER TUMOR;  Surgeon: [name], MD;  Location[facility];  Service: Urology;  Laterality: N/A;   ESOPHAGOGASTRODUODENOSCOPY (EGD) WITH PROPOFOL N/A 04/25/2015   Procedure: ESOPHAGOGASTRODUODENOSCOPY (EGD) WITH PROPOFOL;  Surgeon: Manus Gunning, MD;  [facility];  Service: Gastroenterology;  Laterality: N/A;   ESOPHAGOGASTRODUODENOSCOPY (EGD) WITH PROPOFOL N/A 07/11/2018   Procedure: ESOPHAGOGASTRODUODENOSCOPY (EGD) WITH PROPOFOL;  Surgeon: Milus Banister, MD;  [facility];  Service: Endoscopy;  Laterality: N/A;   IRRIGATION AND DEBRIDEMENT SEBACEOUS CYST  01-11-13   "off my back"   KYPHOPLASTY N/A 12/23/2017   Procedure: Lumbar One KYPHOPLASTY;  Surgeon: Erline Levine, MD;  Location: [place];  Service: Neurosurgery;  Laterality: N/A;  Lumbar One KYPHOPLASTY   LEFT AND RIGHT HEART CATHETERIZATION WITH CORONARY ANGIOGRAM N/A 04/12/2014   Procedure: LEFT AND RIGHT HEART CATHETERIZATION WITH CORONARY ANGIOGRAM;  Surgeon: Peter M Martinique, MD;  Location: [facility] CATH LAB;  Service: Cardiovascular;  Laterality: N/A;   ORIF FEMUR FRACTURE Left 10-02-2010   dr Alvan Dame   subtrochanteric   POLYPECTOMY  07/11/2018   Procedure: POLYPECTOMY;  Surgeon: Milus Banister, MD;  [facility];  Service: Endoscopy;;   TONSILLECTOMY  1940's   TRANSTHORACIC ECHOCARDIOGRAM  05/01/2017   mild concentric LVH, ef 92-11%, grade 1 diastolic dysfunction/ trivial AR/ mild dilated ascending aorta/ mild to moderate MR (no stenosis)/ mild LAE/ mild TR/ mild to moderate increase PASP 40mmHg   Social History:  reports that he quit smoking about 49 years ago. His smoking use included cigarettes. He has a 20.00 pack-year smoking history. He has never used smokeless tobacco. He reports current alcohol use of about 7.0 standard drinks per week. He reports that he does not use drugs.  No Known Allergies  Family History  Problem Relation Age of Onset   Macular degeneration Mother    Osteoarthritis Mother    Aneurysm Father        d/o 73 yo, ruptured AAA   Heart disease Other        No family history   Colon cancer Neg Hx    Colon polyps Neg Hx    Diabetes Neg Hx     Prior to Admission medications   Medication Sig Start Date End Date Taking? Authorizing Provider   allopurinol (ZYLOPRIM) 300 MG tablet Take 300 mg by mouth every morning.     [provider]  COVID-19 mRNA bivalent vaccine, Pfizer, (PFIZER COVID-19 VAC BIVALENT) injection Inject into the muscle. 05/10/21   Carlyle Basques, MD  ferrous sulfate 325 (65 FE) MG tablet Take 1 tablet (325 mg total) by mouth daily for 30 days. 07/11/18 03/27/20  Elodia Florence., MD  HYDROcodone-acetaminophen (NORCO/VICODIN) 5-325 MG tablet Take 1 tablet by mouth 2 (two) times daily. Patient not taking: Reported on 04/10/2021 07/08/18   [provider]  olmesartan (BENICAR) 20 MG tablet TAKE 1 TABLET(20 MG) BY MOUTH DAILY Patient not taking: Reported on 04/10/2021 01/16/21   Lelon Perla, MD  olmesartan (BENICAR) 40 MG tablet Take 1 tablet (40 mg total) by  mouth daily. 03/27/20   Lelon Perla, MD  omeprazole (PRILOSEC) 20 MG capsule Take 1 capsule (20 mg total) by mouth daily. Please keep your March Appointment for further refills. Thank you 08/02/20   Armbruster, Carlota Raspberry, MD  XARELTO 20 MG TABS tablet TAKE 1 TABLET(20 MG) BY MOUTH DAILY WITH SUPPER 01/12/21   Lelon Perla, MD    Physical Exam: Vitals:   08/24/21 0821 08/24/21 0845 08/24/21 1031 08/24/21 1100  BP:  132/62 (!) 128/58 137/75  Pulse:  95 85 91  Resp:  (!) 26 19 (!) 23  Temp: 99.3 F (37.4 C)     TempSrc: Rectal     SpO2:  97% 98% 97%  Weight:      Height:       General: 81 y.o. male resting in bed in NAD Eyes: PERRL, normal sclera ENMT: Nares patent w/o discharge, orophaynx clear, dentition normal, ears w/o discharge/lesions/ulcers Neck: Supple, trachea midline Cardiovascular: tachy, +S1, S2, no m/g/r, equal pulses throughout Respiratory: scattered wheeze and upper airway transmission, no r/r, slightly increased WOB GI: BS+, NDNT, no masses noted, no organomegaly noted MSK: No e/c/c Neuro: A&O x 3, no focal deficits Psyc: Appropriate interaction and affect, calm/cooperative   Data Reviewed:  Na+:  130 CO2: 17 BUN 26 T bili 1.8 Lactic acid 2.9 -> 2.0 WBC 14 Hgb 12.4  CXR: Low lung volumes with bibasilar airspace disease. While this likely reflects atelectasis, infection is not excluded.  Assessment and Plan: No notes have been filed under this hospital service. Service: Hospitalist Sepsis     - admit to inpt, tele     - sepsis: tachycardia, tachypnea, elevated WBC, elevated lactic acid; source: presumably PNA     - started on CAP coverage, continue for now     - check urine legionella/strep; check MRSA     - continue fluids     - add PRN nebs, guaifenesin     - follow UA, bld Cx, UCx      - procal ordered  Generalized weakness Falls     - secondary to above     - PT/OT while inpt  HTN     - continue home regimen when confirmed  Hx of PTE on lifelong anticoag     - continue xarelto  Gout     - resume home regimen when confirmed  Advance Care Planning:   Code Status: FULL   Consults: None  Family Communication: w/ wife at bedside  Severity of Illness: The appropriate patient status for this patient is INPATIENT. Inpatient status is judged to be reasonable and necessary in order to provide the required intensity of service to ensure the patient's safety. The patient's presenting symptoms, physical exam findings, and initial radiographic and laboratory data in the context of their chronic comorbidities is felt to place them at high risk for further clinical deterioration. Furthermore, it is not anticipated that the patient will be medically stable for discharge from the hospital within 2 midnights of admission.   * I certify that at the point of admission it is my clinical judgment that the patient will require inpatient hospital care spanning beyond 2 midnights from the point of admission due to high intensity of service, high risk for further deterioration and high frequency of surveillance required.*  Author: Jonnie Finner, DO 08/24/2021 11:43 AM  For on call  review [email or web address].

## 2021-08-24 NOTE — ED Triage Notes (Signed)
Pt coming from home via EMS. Patient had fall/slide this morning due to increased weakness and decreased mobility the last few days. Pt on blood thinners. Pt's wife reports that pt had a low grade fever yesterday. Pt is having some new urinary incontinence the last few days, which is new per EMS. Pt denies pain, frequency, and abnormal urine. Pt currently has a wound on his lower back and he reports it has been there for a month. Pt also reports a "large baseball like bump mid-back." A&Ox4 ?

## 2021-08-24 NOTE — ED Provider Notes (Signed)
?[name] DEPT ?Provider Note ? ? ?CSN: [id number] ?Arrival date & time: 08/24/21  0750 ? ?  ? ?History ? ?Chief Complaint  ?Patient presents with  ? Possible sepsis  ? ? ?[name] is a 81 y.o. male. ? ?81 year old male presents from home with increased weakness.  Patient fell out of bed today and did not strike his head or injure himself.  Has had a reported low-grade temperature at home.  He has had some urinary incontinence but denies any dysuria.  Slight cough but no shortness of breath.  No vomiting or diarrhea.  EMS was called and patient transported here ? ? ?  ? ?Home Medications ?Prior to Admission medications   ?Medication Sig Start Date End Date Taking? Authorizing Provider  ?allopurinol (ZYLOPRIM) 300 MG tablet Take 300 mg by mouth every morning.     [provider]  ?COVID-19 mRNA bivalent vaccine, Pfizer, (PFIZER COVID-19 VAC BIVALENT) injection Inject into the muscle. 05/10/21   Carlyle Basques, MD  ?ferrous sulfate 325 (65 FE) MG tablet Take 1 tablet (325 mg total) by mouth daily for 30 days. 07/11/18 03/27/20  Elodia Florence., MD  ?HYDROcodone-acetaminophen (NORCO/VICODIN) 5-325 MG tablet Take 1 tablet by mouth 2 (two) times daily. ?Patient not taking: Reported on 04/10/2021 07/08/18   [provider]  ?olmesartan (BENICAR) 20 MG tablet TAKE 1 TABLET(20 MG) BY MOUTH DAILY ?Patient not taking: Reported on 04/10/2021 01/16/21   Lelon Perla, MD  ?olmesartan (BENICAR) 40 MG tablet Take 1 tablet (40 mg total) by mouth daily. 03/27/20   Lelon Perla, MD  ?omeprazole (PRILOSEC) 20 MG capsule Take 1 capsule (20 mg total) by mouth daily. Please keep your March Appointment for further refills. Thank you 08/02/20   Yetta Flock, MD  ?Alveda Reasons 20 MG TABS tablet TAKE 1 TABLET(20 MG) BY MOUTH DAILY WITH SUPPER 01/12/21   Lelon Perla, MD  ?   ? ?Allergies    ?Patient has no known allergies.   ? ?Review of Systems   ?Review of Systems   ?All other systems reviewed and are negative. ? ?Physical Exam ?Updated Vital Signs ?BP (!) 153/87 (BP Location: Left Arm)   Pulse 96   Temp 98.2 ?F (36.8 ?C) (Oral)   Resp (!) 22   Ht 1.829 m (6')   Wt 97.5 kg   SpO2 95%   BMI 29.16 kg/m?  ?Physical Exam ?Vitals and nursing note reviewed.  ?Constitutional:   ?   General: He is not in acute distress. ?   Appearance: Normal appearance. He is well-developed. He is not toxic-appearing.  ?HENT:  ?   Head: Normocephalic and atraumatic.  ?Eyes:  ?   General: Lids are normal.  ?   Conjunctiva/sclera: Conjunctivae normal.  ?   Pupils: Pupils are equal, round, and reactive to light.  ?Neck:  ?   Thyroid: No thyroid mass.  ?   Trachea: No tracheal deviation.  ?Cardiovascular:  ?   Rate and Rhythm: Normal rate and regular rhythm.  ?   Heart sounds: Normal heart sounds. No murmur heard. ?  No gallop.  ?Pulmonary:  ?   Effort: Pulmonary effort is normal. No respiratory distress.  ?   Breath sounds: Normal breath sounds. No stridor. No decreased breath sounds, wheezing, rhonchi or rales.  ?Abdominal:  ?   General: There is no distension.  ?   Palpations: Abdomen is soft.  ?   Tenderness: There is no abdominal tenderness. There  is no rebound.  ?Musculoskeletal:     ?   General: No tenderness. Normal range of motion.  ?   Cervical back: Normal range of motion and neck supple.  ?     Back: ? ?Skin: ?   General: Skin is warm and dry.  ?   Findings: No abrasion or rash.  ?Neurological:  ?   Mental Status: He is alert and oriented to person, place, and time. Mental status is at baseline.  ?   GCS: GCS eye subscore is 4. GCS verbal subscore is 5. GCS motor subscore is 6.  ?   Cranial Nerves: No cranial nerve deficit.  ?   Sensory: No sensory deficit.  ?   Motor: Motor function is intact. No tremor.  ?Psychiatric:     ?   Attention and Perception: Attention normal.     ?   Speech: Speech normal.     ?   Behavior: Behavior normal.  ? ? ?ED Results / Procedures / Treatments    ?Labs ?(all labs ordered are listed, but only abnormal results are displayed) ?Labs Reviewed - No data to display ? ?EKG ?EKG Interpretation ? ?Date/Time:  Friday August 24 2021 08:16:37 EST ?Ventricular Rate:  118 ?PR Interval:  151 ?QRS Duration: 131 ?QT Interval:  358 ?QTC Calculation: 502 ?R Axis:   -76 ?Text Interpretation: Sinus tachycardia Atrial premature complexes RBBB and LAFB Confirmed by Lacretia Leigh ([id number]) on 08/24/2021 9:56:35 AM ? ?Radiology ?No results found. ? ?Procedures ?Procedures  ? ? ?Medications Ordered in ED ?Medications  ?lactated ringers infusion (has no administration in time range)  ? ? ?ED Course/ Medical Decision Making/ A&P ?  ?                        ?Medical Decision Making ?Amount and/or Complexity of Data Reviewed ?Labs: ordered. ?Radiology: ordered. ?ECG/medicine tests: ordered. ? ?Risk ?Prescription drug management. ? ?EKG per my interpretation shows sinus tachycardia.  No signs of acute coronary ischemia. ?Patient presented with increased weakness.  Has low-grade temperature here.  Chest x-ray concerning for pneumonia.  Has elevated lactate and felt that this is the source.  Blood cultures obtained. ?Code sepsis initiated and patient given 30/kg of IV saline and started on IV antibiotics.  Patient's overall condition.  Patient will require inpatient mission.  Will discuss with hospitalist team to admit ? ?CRITICAL CARE ?Performed by: Leota Jacobsen ?Total critical care time: 45 minutes ?Critical care time was exclusive of separately billable procedures and treating other patients. ?Critical care was necessary to treat or prevent imminent or life-threatening deterioration. ?Critical care was time spent personally by me on the following activities: development of treatment plan with patient and/or surrogate as well as nursing, discussions with consultants, evaluation of patient's response to treatment, examination of patient, obtaining history from patient or surrogate, ordering  and performing treatments and interventions, ordering and review of laboratory studies, ordering and review of radiographic studies, pulse oximetry and re-evaluation of patient's condition.  ? ? ? ? ? ? ? ?Final Clinical Impression(s) / ED Diagnoses ?Final diagnoses:  ?None  ? ? ?Rx / DC Orders ?ED Discharge Orders   ? ? None  ? ?  ? ? ?  ?Lacretia Leigh, MD ?08/24/21 1050 ? ?

## 2021-08-24 NOTE — Sepsis Progress Note (Signed)
Code Sepsis protocol being monitored by eLink. 

## 2021-08-24 NOTE — Progress Notes (Signed)
Pt showing yellow MEWS for increased respirations and elevated HR. Yellow MEWs protocol initiated. MD notified. ?

## 2021-08-25 DIAGNOSIS — R531 Weakness: Secondary | ICD-10-CM

## 2021-08-25 DIAGNOSIS — Z86711 Personal history of pulmonary embolism: Secondary | ICD-10-CM

## 2021-08-25 DIAGNOSIS — W19XXXA Unspecified fall, initial encounter: Secondary | ICD-10-CM

## 2021-08-25 DIAGNOSIS — L8990 Pressure ulcer of unspecified site, unspecified stage: Secondary | ICD-10-CM | POA: Diagnosis present

## 2021-08-25 DIAGNOSIS — R627 Adult failure to thrive: Secondary | ICD-10-CM

## 2021-08-25 LAB — CBC
HCT: 31.8 % — ABNORMAL LOW (ref 39.0–52.0)
Hemoglobin: 10.7 g/dL — ABNORMAL LOW (ref 13.0–17.0)
MCH: 33.4 pg (ref 26.0–34.0)
MCHC: 33.6 g/dL (ref 30.0–36.0)
MCV: 99.4 fL (ref 80.0–100.0)
Platelets: 133 10*3/uL — ABNORMAL LOW (ref 150–400)
RBC: 3.2 MIL/uL — ABNORMAL LOW (ref 4.22–5.81)
RDW: 13 % (ref 11.5–15.5)
WBC: 10.6 10*3/uL — ABNORMAL HIGH (ref 4.0–10.5)
nRBC: 0 % (ref 0.0–0.2)

## 2021-08-25 LAB — COMPREHENSIVE METABOLIC PANEL
ALT: 16 U/L (ref 0–44)
AST: 24 U/L (ref 15–41)
Albumin: 2.6 g/dL — ABNORMAL LOW (ref 3.5–5.0)
Alkaline Phosphatase: 38 U/L (ref 38–126)
Anion gap: 7 (ref 5–15)
BUN: 26 mg/dL — ABNORMAL HIGH (ref 8–23)
CO2: 25 mmol/L (ref 22–32)
Calcium: 7.5 mg/dL — ABNORMAL LOW (ref 8.9–10.3)
Chloride: 100 mmol/L (ref 98–111)
Creatinine, Ser: 0.95 mg/dL (ref 0.61–1.24)
GFR, Estimated: >60 mL/min (ref >60)
Glucose, Bld: 116 mg/dL — ABNORMAL HIGH (ref 70–99)
Potassium: 4.5 mmol/L (ref 3.5–5.1)
Sodium: 132 mmol/L — ABNORMAL LOW (ref 135–145)
Total Bilirubin: 0.5 mg/dL (ref 0.3–1.2)
Total Protein: 5.2 g/dL — ABNORMAL LOW (ref 6.5–8.1)

## 2021-08-25 LAB — MRSA NEXT GEN BY PCR, NASAL: MRSA by PCR Next Gen: NOT DETECTED

## 2021-08-25 LAB — LACTIC ACID, PLASMA
Lactic Acid, Venous: 1.3 mmol/L (ref 0.5–1.9)
Lactic Acid, Venous: 1.5 mmol/L (ref 0.5–1.9)

## 2021-08-25 LAB — CORTISOL-AM, BLOOD: Cortisol - AM: 26.4 ug/dL — ABNORMAL HIGH (ref 6.7–22.6)

## 2021-08-25 LAB — PROTIME-INR
INR: 1.8 — ABNORMAL HIGH (ref 0.8–1.2)
Prothrombin Time: 20.4 seconds — ABNORMAL HIGH (ref 11.4–15.2)

## 2021-08-25 LAB — PROCALCITONIN: Procalcitonin: 5.26 ng/mL

## 2021-08-25 MED ORDER — ALLOPURINOL 300 MG PO TABS
300.0000 mg | ORAL_TABLET | Freq: Every morning | ORAL | Status: DC
  Administered 2021-08-25 – 2021-08-30 (×6): 300 mg via ORAL
  Filled 2021-08-25 (×6): qty 1

## 2021-08-25 MED ORDER — RIVAROXABAN 20 MG PO TABS
20.0000 mg | ORAL_TABLET | Freq: Every day | ORAL | Status: DC
  Administered 2021-08-25 – 2021-08-30 (×6): 20 mg via ORAL
  Filled 2021-08-25 (×6): qty 1

## 2021-08-25 MED ORDER — SODIUM CHLORIDE 0.9 % IV SOLN: INTRAVENOUS | Status: AC

## 2021-08-25 NOTE — Evaluation (Signed)
Physical Therapy Evaluation ?Patient Details ?Name: [name] ?MRN: [id number] ?DOB: [birth year] ?Today's Date: 08/25/2021 ? ?History of Present Illness ? H/o chronic lower back, L1 kyphoplasty, Barrett's esophagus, prostate CA s/p radiation ,gout, HTN, AAA, CAD, PE, on xarelto, presented with fever, decrease in appetite ,weakness, increased falls, found to have pneumonia and sepsis. Wife reports fall 2 days before admission and another fall day before admission.  ?Clinical Impression ? Pt admitted with above diagnosis.  Pt currently with functional limitations due to the deficits listed below (see PT Problem List). Pt will benefit from skilled PT to increase their independence and safety with mobility to allow discharge to the venue listed below.  Pt sat EOB with MOD A and was unable to stand due to fatigue, and required 2 person A to scoot him up in the bed.  Wife is unable to care for patient at his current level and recommend short term SNF.  Discussed at length with patient and his wife.  He stated he would "think about it".  He is not safe to d/c with his elderly wife with his current mobility needs. ? ?   ? ?Recommendations for follow up therapy are one component of a multi-disciplinary discharge planning process, led by the attending physician.  Recommendations may be updated based on patient status, additional functional criteria and insurance authorization. ? ?Follow Up Recommendations Skilled nursing-short term rehab (<3 hours/day) ? ?  ?Assistance Recommended at Discharge Frequent or constant Supervision/Assistance  ?Patient can return home with the following ? Two people to help with walking and/or transfers;Two people to help with bathing/dressing/bathroom ? ?  ?Equipment Recommendations None recommended by PT  ?Recommendations for Other Services ? OT consult  ?  ?Functional Status Assessment Patient has had a recent decline in their functional status and demonstrates the ability to make significant  improvements in function in a reasonable and predictable amount of time.  ? ?  ?Precautions / Restrictions Precautions ?Precautions: Fall ?Precaution Comments: 2 falls last week  ? ?  ? ?Mobility ? Bed Mobility ?Overal bed mobility: Needs Assistance ?Bed Mobility: Supine to Sit ?  ?  ?Supine to sit: Mod assist ?  ?  ?General bed mobility comments: Assistance to get legs to the edge of bed and then to get trunk upright. He sat for less than 2 minutes and reported fatigue and had to return supine. No c/o dizziness though. 2 person A to scoot to [facility] with bed in trendelenberg. ?  ? ?Transfers ?  ?  ?  ?  ?  ?  ?  ?  ?  ?General transfer comment: Unable to attempt due to fatigue. ?  ? ?Ambulation/Gait ?  ?  ?  ?  ?  ?  ?  ?  ? ?Stairs ?  ?  ?  ?  ?  ? ?Wheelchair Mobility ?  ? ?Modified Rankin (Stroke Patients Only) ?  ? ?  ? ?Balance Overall balance assessment: History of Falls ?  ?  ?  ?  ?  ?  ?  ?  ?  ?  ?  ?  ?  ?  ?  ?  ?  ?  ?   ? ? ? ?Pertinent Vitals/Pain Pain Assessment ?Pain Assessment: Faces ?Faces Pain Scale: Hurts little more ?Pain Location: chronic back pain ?Pain Intervention(s): Monitored during session, Repositioned  ? ? ?Home Living Family/patient expects to be discharged to:: Private residence ?Living Arrangements: Spouse/significant other ?Available Help at Discharge: Family ?Type of  Home: House ([place]) ?Home Access: Stairs to enter ?  ?Entrance Stairs-Number of Steps: 1 ?  ?Home Layout: One level ?Home Equipment: Grab bars - tub/shower;Shower seat - built in;Cane - single point ?   ?  ?Prior Function Prior Level of Function : Independent/Modified Independent ?  ?  ?  ?  ?  ?  ?  ?  ?  ? ? ?Hand Dominance  ?   ? ?  ?Extremity/Trunk Assessment  ? Upper Extremity Assessment ?Upper Extremity Assessment: Defer to OT evaluation ?  ? ?Lower Extremity Assessment ?Lower Extremity Assessment: Generalized weakness ?  ? ?Cervical / Trunk Assessment ?Cervical / Trunk Assessment: Kyphotic  ?Communication  ?  Communication: No difficulties  ?Cognition Arousal/Alertness: Awake/alert ?Behavior During Therapy: [facility] for tasks assessed/performed ?Overall Cognitive Status: Within Functional Limits for tasks assessed ?  ?  ?  ?  ?  ?  ?  ?  ?  ?  ?  ?  ?  ?  ?  ?  ?  ?  ?  ? ?  ?General Comments   ? ?  ?Exercises    ? ?Assessment/Plan  ?  ?PT Assessment Patient needs continued PT services  ?PT Problem List Decreased strength;Decreased activity tolerance;Decreased balance;Decreased mobility;Decreased knowledge of precautions ? ?   ?  ?PT Treatment Interventions DME instruction;Functional mobility training;Therapeutic exercise;Therapeutic activities;Neuromuscular re-education;Balance training;Gait training   ? ?PT Goals (Current goals can be found in the Care Plan section)  ?Acute Rehab PT Goals ?Patient Stated Goal: rehab ?PT Goal Formulation: With family ?Time For Goal Achievement: 09/08/21 ?Potential to Achieve Goals: Good ? ?  ?Frequency Min 2X/week ?  ? ? ?Co-evaluation   ?  ?  ?  ?  ? ? ?  ?AM-PAC PT "6 Clicks" Mobility  ?Outcome Measure Help needed turning from your back to your side while in a flat bed without using bedrails?: A Lot ?Help needed moving from lying on your back to sitting on the side of a flat bed without using bedrails?: A Lot ?Help needed moving to and from a bed to a chair (including a wheelchair)?: A Lot ?Help needed standing up from a chair using your arms (e.g., wheelchair or bedside chair)?: A Lot ?Help needed to walk in hospital room?: Total ?Help needed climbing 3-5 steps with a railing? : Total ?6 Click Score: 10 ? ?  ?End of Session   ?Activity Tolerance: Patient limited by fatigue ?Patient left: in bed;with call bell/phone within reach;with bed alarm set;with family/visitor present ?Nurse Communication: Mobility status ?PT Visit Diagnosis: History of falling (Z91.81);Muscle weakness (generalized) (M62.81);Difficulty in walking, not elsewhere classified (R26.2);Unsteadiness on feet (R26.81) ?   ? ?Time: 0539-7673 ?PT Time Calculation (min) (ACUTE ONLY): 43 min ? ? ?Charges:   PT Evaluation ?$PT Eval Moderate Complexity: 1 Mod ?PT Treatments ?$Therapeutic Activity: 23-37 mins ?  ?   ? ? ?Santiago Glad L. Tamala Julian, PT ? ?08/25/2021 ? ? ? ?Galen Manila ?08/25/2021, 3:58 PM ? ?

## 2021-08-25 NOTE — Plan of Care (Signed)

## 2021-08-25 NOTE — Progress Notes (Signed)
?PROGRESS NOTE ? ? ? ?[name]  VZD:[id number] DOB: 10-Nov-1940 DOA: 08/24/2021 ?PCP: Deland Pretty, MD  ? ? ? ?Brief Narrative:  ? ?H/o Barrett's esophagus, prostate CA s/p radiation ,gout, HTN, AAA, CAD, PE on xarelto, presented with fever, decrease in appetite ,weakness, increased falls, found to have pneumonia and sepsis ? ?Subjective: ? ?Tachypnea and sinus tachycardia has improved ?No fever ?Reports feeling weak ?Wife at bedside  ? ?Assessment & Plan: ? Principal Problem: ?  Sepsis ([place]) ?Active Problems: ?  Gout ?  Hypertension ?  History of pulmonary embolus (PE) ?  Generalized weakness ?  Falls ? ? ? ?Assessment and Plan: ?No notes have been filed under this hospital service. ?Service: Hospitalist ? ? ?Sepsis present on admission likely due to pneumonia ?-Tachypnea /tachycardia /leukocytosis has improved, lactic acidosis normalized ?-COVID screening and flu screening negative ?-Blood culture and urine culture obtained in the ED in process ?-MRSA screening and urine strep urine Legionella ordered yesterday pending collection ?-Continue current regimen ? ?Right lower qaudrant /groin tenderness ?Ua no bacteria,  ?Will get bladder scan ?Consider ct ab/pel if does not improve ? ?Hyponatremia ?Likely from dehydration from poor oral intake in the last few days, continue hydration ? ?Mild thrombocytopenia ?Likely due to infection ?Monitor CBC ? ?Normocytic anemia ?No overt sign of external bleeding ?Monitor CBC ? ?History of hypertension ?-Hold Benicar in the setting of sepsis, resume when appropriate ? ? ?History of PE ?Continue Xarelto ? ?Barrett's esophagus ?Continue PPI ? ?Gout ?Continue allopurinol ? ?FTT/falls, PT eval. May need snf ? ?Alcohol use, wife reports he drinks wine continuously for about 4hrs daily ?No prior alcohol withdrawal issues ?Monitor  ? ?Reports developed a sore at his back for a month, he has not walked much recently, he mostly sit around due to weakness and fear of falls, wound care RN  consult placed  ? ? ? ? ? ?: Body mass index is 29.16 kg/m?Marland Kitchen. ? ? ? ? ?I have Reviewed nursing notes, Vitals, pain scores, I/o's, Lab results and  imaging results since pt's last encounter, details please see discussion above  ?I ordered the following labs:  ?Unresulted Labs (From admission, onward)  ? ?  Start     Ordered  ? 08/26/21 0500  CBC with Differential/Platelet  Tomorrow morning,   R       ? 08/25/21 0757  ? 08/26/21 2951  Basic metabolic panel  Tomorrow morning,   R       ? 08/25/21 0757  ? 08/26/21 0500  Magnesium  Tomorrow morning,   STAT       ? 08/25/21 0757  ? 08/25/21 0955  Legionella Pneumophila Serogp 1 Ur Ag  Once,   R       ? 08/25/21 0954  ? 08/25/21 0955  Strep pneumoniae urinary antigen  Once,   R       ? 08/25/21 0954  ? 08/25/21 0955  MRSA Next Gen by PCR, Nasal  Once,   R       ? 08/25/21 0954  ? 08/24/21 0816  Blood Culture (routine x 2)  (Undifferentiated presentation (screening labs and basic nursing orders))  BLOOD CULTURE X 2,   STAT     ? 08/24/21 0816  ? 08/24/21 0816  Urine Culture  (Undifferentiated presentation (screening labs and basic nursing orders))  ONCE - STAT,   STAT       ?Question:  Indication  Answer:  Dysuria  ? 08/24/21 0816  ? ?  ?  ? ?  ? ? ? ?  DVT prophylaxis:   On Xarelto ?rivaroxaban (XARELTO) tablet 20 mg  ? ?Code Status:   Code Status: Full Code ? ?Family Communication: wife at bedside  ?Disposition:  ? ?Status is: Inpatient ? ? ?Dispo: The patient is from: Home ?             Anticipated d/c is to: TBD, likely will need snf ?             Anticipated d/c date is: 24 to 48 hours ? ?Antimicrobials:   ?Anti-infectives (From admission, onward)  ? ? Start     Dose/Rate Route Frequency Ordered Stop  ? 08/24/21 0945  cefTRIAXone (ROCEPHIN) 2 g in sodium chloride 0.9 % 100 mL IVPB       ? 2 g ?200 mL/hr over 30 Minutes Intravenous Every 24 hours 08/24/21 0940 08/29/21 0959  ? 08/24/21 0945  azithromycin (ZITHROMAX) 500 mg in sodium chloride 0.9 % 250 mL IVPB       ? 500  mg ?250 mL/hr over 60 Minutes Intravenous Every 24 hours 08/24/21 0940 08/29/21 0959  ? ?  ? ? ? ? ? ? ?Objective: ?Vitals:  ? 08/24/21 2350 08/25/21 0107 08/25/21 0445 08/25/21 0747  ?BP:  102/64 106/63 (!) 112/58  ?Pulse:  99 88 89  ?Resp:  (!) '22 20 18  '$ ?Temp:  98.9 ?F (37.2 ?C) 98.9 ?F (37.2 ?C) 98.3 ?F (36.8 ?C)  ?TempSrc:  Axillary Oral Oral  ?SpO2: 97% 99% 99% 97%  ?Weight:      ?Height:      ? ? ?Intake/Output Summary (Last 24 hours) at 08/25/2021 0958 ?Last data filed at 08/25/2021 0500 ?Gross per 24 hour  ?Intake 2025.05 ml  ?Output 400 ml  ?Net 1625.05 ml  ? ?Filed Weights  ? 08/24/21 0814  ?Weight: 97.5 kg  ? ? ?Examination: ? ?General exam: alert, awake, communicative,calm, NAD ?Respiratory system: Clear to auscultation. Respiratory effort normal. ?Cardiovascular system:  RRR.  ?Gastrointestinal system: Abdomen is nondistended, soft and nontender.  Normal bowel sounds heard. ?Central nervous system: Alert and oriented. No focal neurological deficits. ?Extremities:  no edema ?Skin: pressure ulcer low back as in the  pic  ?Psychiatry: Judgement and insight appear normal. Mood & affect appropriate.  ? ? ? ?Data Reviewed: I have personally reviewed  labs and visualized  imaging studies since the last encounter and formulate the plan  ? ? ? ? ? ? ?Scheduled Meds: ? allopurinol  300 mg Oral q morning  ? feeding supplement  237 mL Oral BID BM  ? mouth rinse  15 mL Mouth Rinse BID  ? rivaroxaban  20 mg Oral Q breakfast  ? ?Continuous Infusions: ? sodium chloride    ? azithromycin Stopped (08/24/21 1136)  ? cefTRIAXone (ROCEPHIN)  IV Stopped (08/24/21 1048)  ? ? ? LOS: 1 day  ? ? ?Florencia Reasons, MD PhD FACP ?[facility] ? ?Available via Epic secure chat 7am-7pm for nonurgent issues ?Please page for urgent issues ?To page the attending provider between 7A-7P or the covering provider during after hours 7P-7A, please log into the web site [email or web address] and access using universal [place] password for that web site.  If you do not have the password, please call the hospital operator. ? ? ? ?08/25/2021, 9:58 AM  ? ? ?

## 2021-08-26 LAB — BASIC METABOLIC PANEL
Anion gap: 5 (ref 5–15)
BUN: 24 mg/dL — ABNORMAL HIGH (ref 8–23)
CO2: 24 mmol/L (ref 22–32)
Calcium: 7.2 mg/dL — ABNORMAL LOW (ref 8.9–10.3)
Chloride: 101 mmol/L (ref 98–111)
Creatinine, Ser: 0.8 mg/dL (ref 0.61–1.24)
GFR, Estimated: >60 mL/min (ref >60)
Glucose, Bld: 122 mg/dL — ABNORMAL HIGH (ref 70–99)
Potassium: 3.7 mmol/L (ref 3.5–5.1)
Sodium: 130 mmol/L — ABNORMAL LOW (ref 135–145)

## 2021-08-26 LAB — CBC WITH DIFFERENTIAL/PLATELET
Abs Immature Granulocytes: 0 10*3/uL (ref 0.00–0.07)
Band Neutrophils: 6 %
Basophils Absolute: 0 10*3/uL (ref 0.0–0.1)
Basophils Relative: 0 %
Eosinophils Absolute: 0.1 10*3/uL (ref 0.0–0.5)
Eosinophils Relative: 1 %
HCT: 33.6 % — ABNORMAL LOW (ref 39.0–52.0)
Hemoglobin: 11.1 g/dL — ABNORMAL LOW (ref 13.0–17.0)
Lymphocytes Relative: 2 %
Lymphs Abs: 0.2 10*3/uL — ABNORMAL LOW (ref 0.7–4.0)
MCH: 32.7 pg (ref 26.0–34.0)
MCHC: 33 g/dL (ref 30.0–36.0)
MCV: 99.1 fL (ref 80.0–100.0)
Monocytes Absolute: 0.7 10*3/uL (ref 0.1–1.0)
Monocytes Relative: 6 %
Neutro Abs: 9.9 10*3/uL — ABNORMAL HIGH (ref 1.7–7.7)
Neutrophils Relative %: 85 %
Platelets: 151 10*3/uL (ref 150–400)
RBC: 3.39 MIL/uL — ABNORMAL LOW (ref 4.22–5.81)
RDW: 13.2 % (ref 11.5–15.5)
WBC: 10.9 10*3/uL — ABNORMAL HIGH (ref 4.0–10.5)
nRBC: 0 % (ref 0.0–0.2)

## 2021-08-26 LAB — MAGNESIUM: Magnesium: 1.6 mg/dL — ABNORMAL LOW (ref 1.7–2.4)

## 2021-08-26 LAB — URINE CULTURE: Culture: <10000 — AB

## 2021-08-26 LAB — STREP PNEUMONIAE URINARY ANTIGEN: Strep Pneumo Urinary Antigen: NEGATIVE

## 2021-08-26 MED ORDER — MAGNESIUM SULFATE 2 GM/50ML IV SOLN
2.0000 g | Freq: Once | INTRAVENOUS | Status: AC
  Administered 2021-08-26: 2 g via INTRAVENOUS
  Filled 2021-08-26: qty 50

## 2021-08-26 MED ORDER — THIAMINE HCL 100 MG PO TABS
100.0000 mg | ORAL_TABLET | Freq: Every day | ORAL | Status: DC
  Administered 2021-08-26 – 2021-08-30 (×5): 100 mg via ORAL
  Filled 2021-08-26 (×5): qty 1

## 2021-08-26 MED ORDER — PROSOURCE PLUS PO LIQD
30.0000 mL | Freq: Two times a day (BID) | ORAL | Status: DC
  Administered 2021-08-26 – 2021-08-27 (×2): 30 mL via ORAL
  Filled 2021-08-26 (×3): qty 30

## 2021-08-26 MED ORDER — ADULT MULTIVITAMIN W/MINERALS CH
1.0000 | ORAL_TABLET | Freq: Every day | ORAL | Status: DC
  Administered 2021-08-26 – 2021-08-30 (×5): 1 via ORAL
  Filled 2021-08-26 (×5): qty 1

## 2021-08-26 NOTE — Progress Notes (Signed)
Had a long discussion with pt and wife clarifying options and care plan. Pt expressed that things were much clearer for him. Both pt and wife expressed satisfaction with the discussion. ?

## 2021-08-26 NOTE — Progress Notes (Signed)
Initial Nutrition Assessment ? ?DOCUMENTATION CODES:  ? ?Not applicable ? ?INTERVENTION:  ?- Encourage adequate PO intake ?- Ensure Enlive po BID, each supplement provides 350 kcal and 20 grams of protein. ?- 30 ml ProSource Plus BID, each supplement provides 100 kcals and 15 grams protein.  ?- MVI with minerals daily ? ?NUTRITION DIAGNOSIS:  ? ?Increased nutrient needs related to acute illness as evidenced by estimated needs. ? ?GOAL:  ? ?Patient will meet greater than or equal to 90% of their needs ? ?MONITOR:  ? ?PO intake, Supplement acceptance, Labs, Weight trends, Skin ? ?REASON FOR ASSESSMENT:  ? ?Malnutrition Screening Tool ?  ? ?ASSESSMENT:  ? ?Pt admitted from home with fever and weakness secondary to PNA and sepsis. PMH includes HTN, AAA, CAD, PE on xarelto, Barrett's esophagus and prostate CA s/p radiation. ? ?Unsuccessful attempt to reach pt by phone.  ? ?Per review of chart, pt has had a decrease in appetite and energy x 4 days. Noted pt's wife reports that he will drink wine continuously for ~4 hours daily.  ? ?No documented meal completions during admission. ? ?Limited documentation of recent weight history. However weights appear to be stable since 03/2021. Current weight noted to be 97.5 kg.  ? ?Medications: IV abx ? ?Labs: sodium 130, BUN 24, corrected calcium 8.3 ? ?NUTRITION - FOCUSED PHYSICAL EXAM: ?RD working remotely. Deferred to follow up. ? ?Diet Order:   ?Diet Order   ? ?       ?  Diet regular Room service appropriate? Yes; Fluid consistency: Thin  Diet effective now       ?  ? ?  ?  ? ?  ? ? ?EDUCATION NEEDS:  ? ?No education needs have been identified at this time ? ?Skin:  Skin Assessment: Skin Integrity Issues: ?Skin Integrity Issues:: Stage II ?Stage II: lower L back ? ?Last BM:  3/4 (type 6) ? ?Height:  ? ?Ht Readings from Last 1 Encounters:  ?08/24/21 6' (1.829 m)  ? ? ?Weight:  ? ?Wt Readings from Last 1 Encounters:  ?08/24/21 97.5 kg  ? ? ?BMI:  Body mass index is 29.16  kg/m?. ? ?Estimated Nutritional Needs:  ? ?Kcal:  2200-2400 ? ?Protein:  110-125g ? ?Fluid:  >/=2.2L ? ?Clayborne Dana, RDN, LDN ?Clinical Nutrition ?

## 2021-08-26 NOTE — Progress Notes (Signed)
?PROGRESS NOTE ? ? ? ?[name]  TFT:[id number] DOB: 04-10-1941 DOA: 08/24/2021 ?PCP: Deland Pretty, MD  ? ? ? ?Brief Narrative:  ? ?H/o Barrett's esophagus, prostate CA s/p radiation ,gout, HTN, AAA, CAD, PE on xarelto, presented with fever, decrease in appetite ,weakness, increased falls, found to have pneumonia and sepsis ? ?Subjective: ? ?Tachypnea and sinus tachycardia has improved ?No fever ?Reports feeling weak, denies pain ?He did not want to get up ?Wife at bedside  ? ?Assessment & Plan: ? Principal Problem: ?  Sepsis ([place]) ?Active Problems: ?  Gout ?  Hypertension ?  History of pulmonary embolus (PE) ?  Generalized weakness ?  Falls ?  Pressure injury of skin ? ? ? ?Assessment and Plan: ?No notes have been filed under this hospital service. ?Service: Hospitalist ? ? ?Sepsis present on admission likely due to pneumonia ?-Tachypnea /tachycardia /leukocytosis has improved, lactic acidosis normalized ?-COVID screening and flu screening negative ?-Blood culture and urine culture obtained in the ED in process ?-MRSA screening negative  ?- urine strep negative ,urine Legionella in process  ?-Continue current regimen ? ?Right lower qaudrant /groin tenderness on 3/4 exam ?Ua no bacteria,  ?Denies pain , no tenderness on exam on 3/5 ?Monitor  ? ? ?Hyponatremia ?Likely from dehydration from poor oral intake in the last few days, continue hydration ? ?Hypomagnesemia ?Replace mag ? ?Mild thrombocytopenia ?Likely due to infection ?Normalized  ? ?Normocytic anemia ?No overt sign of external bleeding ?Monitor CBC ? ?History of hypertension ?-Hold Benicar in the setting of sepsis, resume when appropriate ? ? ?History of PE ?Continue Xarelto ? ?Barrett's esophagus ?Continue PPI ? ?Gout ?Continue allopurinol ? ?FTT/falls, PT eval. Snf placement ? ?Alcohol use, wife reports he drinks wine continuously for about 4hrs daily ?No prior alcohol withdrawal issues ?Start thiamin supplement ?Monitor  ? ?Reports developed a sore at  his back for a month, he has not walked much recently, he mostly sit around due to weakness and fear of falls, wound care RN consult placed  ? ? ? ? ? ?: Body mass index is 29.16 kg/m?Marland Kitchen. ? ? ? ? ?I have Reviewed nursing notes, Vitals, pain scores, I/o's, Lab results and  imaging results since pt's last encounter, details please see discussion above  ?I ordered the following labs:  ?Unresulted Labs (From admission, onward)  ? ?  Start     Ordered  ? 08/27/21 0500  CBC with Differential/Platelet  Tomorrow morning,   R       ?Question:  Specimen collection method  Answer:  Lab=Lab collect  ? 08/26/21 2006  ? 08/27/21 7062  Basic metabolic panel  Tomorrow morning,   R       ?Question:  Specimen collection method  Answer:  Lab=Lab collect  ? 08/26/21 2006  ? 08/27/21 0500  Magnesium  Tomorrow morning,   R       ?Question:  Specimen collection method  Answer:  Lab=Lab collect  ? 08/26/21 2006  ? 08/25/21 1215  Legionella Pneumophila Serogp 1 Ur Ag  Once,   R       ? 08/25/21 1215  ? ?  ?  ? ?  ? ? ? ?DVT prophylaxis:   On Xarelto ?rivaroxaban (XARELTO) tablet 20 mg  ? ?Code Status:   Code Status: Full Code ? ?Family Communication: wife at bedside daily ?Disposition:  ? ?Status is: Inpatient ? ? ?Dispo: The patient is from: Home ?  Anticipated d/c is to: snf ?             Anticipated d/c date is: 24 to 48 hours ? ?Antimicrobials:   ?Anti-infectives (From admission, onward)  ? ? Start     Dose/Rate Route Frequency Ordered Stop  ? 08/24/21 0945  cefTRIAXone (ROCEPHIN) 2 g in sodium chloride 0.9 % 100 mL IVPB       ? 2 g ?200 mL/hr over 30 Minutes Intravenous Every 24 hours 08/24/21 0940 08/29/21 0959  ? 08/24/21 0945  azithromycin (ZITHROMAX) 500 mg in sodium chloride 0.9 % 250 mL IVPB       ? 500 mg ?250 mL/hr over 60 Minutes Intravenous Every 24 hours 08/24/21 0940 08/29/21 0959  ? ?  ? ? ? ? ? ? ?Objective: ?Vitals:  ? 08/25/21 1700 08/25/21 2025 08/26/21 0020 08/26/21 0417  ?BP: (!) 158/92 122/67 112/64 110/62   ?Pulse: (!) 101 (!) 110 94 93  ?Resp:  '14 16 12  '$ ?Temp: 99.3 ?F (37.4 ?C) 98.3 ?F (36.8 ?C) 97.9 ?F (36.6 ?C) 98.7 ?F (37.1 ?C)  ?TempSrc: Oral Oral Oral Oral  ?SpO2: 98% 98% 97% 97%  ?Weight:      ?Height:      ? ? ?Intake/Output Summary (Last 24 hours) at 08/26/2021 2006 ?Last data filed at 08/26/2021 0400 ?Gross per 24 hour  ?Intake 1018.73 ml  ?Output 425 ml  ?Net 593.73 ml  ? ?Filed Weights  ? 08/24/21 0814  ?Weight: 97.5 kg  ? ? ?Examination: ? ?General exam: alert, awake, communicative,calm, NAD ?Respiratory system: Clear to auscultation. Respiratory effort normal. ?Cardiovascular system:  RRR.  ?Gastrointestinal system: Abdomen is nondistended, soft and nontender.  Normal bowel sounds heard. ?Central nervous system: Alert and oriented. No focal neurological deficits. ?Extremities:  no edema ?Skin: pressure ulcer low back as in the  pic  ?Psychiatry: Judgement and insight appear normal. Mood & affect appropriate.  ? ? ? ?Data Reviewed: I have personally reviewed  labs and visualized  imaging studies since the last encounter and formulate the plan  ? ? ? ? ? ? ?Scheduled Meds: ? (feeding supplement) PROSource Plus  30 mL Oral BID BM  ? allopurinol  300 mg Oral q morning  ? feeding supplement  237 mL Oral BID BM  ? mouth rinse  15 mL Mouth Rinse BID  ? multivitamin with minerals  1 tablet Oral Daily  ? rivaroxaban  20 mg Oral Q breakfast  ? thiamine  100 mg Oral Daily  ? ?Continuous Infusions: ? azithromycin 500 mg (08/26/21 1209)  ? cefTRIAXone (ROCEPHIN)  IV 2 g (08/26/21 1010)  ? ? ? LOS: 2 days  ? ? ?Florencia Reasons, MD PhD FACP ?[facility] ? ?Available via Epic secure chat 7am-7pm for nonurgent issues ?Please page for urgent issues ?To page the attending provider between 7A-7P or the covering provider during after hours 7P-7A, please log into the web site [email or web address] and access using universal [place] password for that web site. If you do not have the password, please call the hospital  operator. ? ? ? ?08/26/2021, 8:06 PM  ? ? ?

## 2021-08-26 NOTE — Progress Notes (Signed)
Chaplain offered support to [name] and [name].  [name] who is originally from [place] decided to stay in [place] upon marrying [name] years ago.  [name]'s Berry has been Jeffery primary caregiver.  They expressed their faith traditions and love for each other.  [name] also expressed feeling like he is a burden to [name].  She affirmed that he isn't a burden and that she wanted to be there to help, while also acknowledging that if she could get who he was 20 years ago back, she would.  ? ?Chaplain provided space and opportunity for [name] to talk and freely express themselves.  [name] has a desire for Jeffery health to get better.  Jeffery health has weighed heavily on him and voiced that it has impacted Jeffery spirit.  Chaplain asked him questions about Jeffery faith community and found that Jeffery ministers visit him once a month and provide communion to him.  He still feels like Jeffery spirit is suffering.  Chaplain asked if [name] would ever be interested in virtual therapy and he voiced, "No."  Chaplain offered prayer over [name] and [name] to work to meet Jeffery spirit needs.   ? ?Chaplain will continue to follow-up. ? ? ? 08/26/21 1700  ?Clinical Encounter Type  ?Visited With Patient and family together  ?Visit Type Initial;Spiritual support  ?Spiritual Encounters  ?Spiritual Needs Prayer  ? ? ?

## 2021-08-26 NOTE — Evaluation (Signed)
Occupational Therapy Evaluation Patient Details Name: [name] MRN: [id number] DOB: 1941-05-28 Today's Date: 08/26/2021   History of Present Illness H/o chronic lower back, L1 kyphoplasty, Barrett's esophagus, prostate CA s/p radiation ,gout, HTN, AAA, CAD, PE, on xarelto, presented with fever, decrease in appetite ,weakness, increased falls, found to have pneumonia and sepsis. Wife reports fall 2 days before admission and another fall day before admission.   Clinical Impression   Patient is a 81 year old male who was admitted for above. Patient was living at home with support from wife prior to admission. Currently, patients pain and concerns over wound healing are impacting participation in all ADL tasks. Patient was extensively educated about benefits of pressure reduction near wound and movement to help with healing. Patient reported he would do these things after the wound was healed first. Patient demonstrated self limitng behaviors this AM with education continued on positioning and movement being beneficial. Patient would continue to benefit from skilled OT services at this time while admitted and after d/c to address noted deficits in order to improve overall safety and independence in ADLs.        Recommendations for follow up therapy are one component of a multi-disciplinary discharge planning process, led by the attending physician.  Recommendations may be updated based on patient status, additional functional criteria and insurance authorization.   Follow Up Recommendations  Skilled nursing-short term rehab (<3 hours/day)    Assistance Recommended at Discharge Frequent or constant Supervision/Assistance  Patient can return home with the following Two people to help with walking and/or transfers;Two people to help with bathing/dressing/bathroom;Direct supervision/assist for medications management;Help with stairs or ramp for entrance;Assist for transportation;Direct  supervision/assist for financial management;Assistance with cooking/housework    Functional Status Assessment     Equipment Recommendations  None recommended by OT    Recommendations for Other Services       Precautions / Restrictions Precautions Precautions: Fall Precaution Comments: 2 falls last week Restrictions Weight Bearing Restrictions: No      Mobility Bed Mobility                    Transfers                          Balance                                           ADL either performed or assessed with clinical judgement   ADL Overall ADL's : Needs assistance/impaired Eating/Feeding: Set up;Sitting     Grooming Details (indicate cue type and reason): patient declined until after breakfast. Upper Body Bathing: Bed level;Total assistance Upper Body Bathing Details (indicate cue type and reason): patient declined stating he already washed up this AM Lower Body Bathing: Total assistance;Bed level Lower Body Bathing Details (indicate cue type and reason): patient declined stating he already washed up this AM Upper Body Dressing : Bed level;Total assistance Upper Body Dressing Details (indicate cue type and reason): patient declined stating he already washed up this AM Lower Body Dressing: Total assistance;Bed level Lower Body Dressing Details (indicate cue type and reason): patient declined stating he already washed up this AM Toilet Transfer: +2 for physical assistance;+2 for safety/equipment Toilet Transfer Details (indicate cue type and reason): patient was continuously educated on importance of reducing pressure on back with positioning in bed  and movement. patient reported that the wound needed to be heald up first then he would move. patient was educated on how not moving was going to keep pressure on wound and increase weakness. patient continued to demonstrate self limitng behaviors impacting participation in any movement  or improvement in positioning on this date. MD in room at end of session encouraging patient to participate with patient continuing to decline Toileting- Clothing Manipulation and Hygiene: Total assistance;Bed level       Functional mobility during ADLs: +2 for physical assistance;+2 for safety/equipment       Vision   Vision Assessment?: No apparent visual deficits Additional Comments: patient was noted to hold head laterally to R side with gaze downward in bed. patient was able to turn head to look at therapist but not in midline     Perception     Praxis      Pertinent Vitals/Pain Pain Assessment Pain Assessment: Faces Faces Pain Scale: Hurts a little bit Pain Location: chronic back pain Pain Descriptors / Indicators: Discomfort Pain Intervention(s): Monitored during session     Hand Dominance Right   Extremity/Trunk Assessment Upper Extremity Assessment Upper Extremity Assessment: Generalized weakness   Lower Extremity Assessment Lower Extremity Assessment: Defer to PT evaluation   Cervical / Trunk Assessment Cervical / Trunk Assessment: Kyphotic   Communication Communication Communication: No difficulties   Cognition Arousal/Alertness: Awake/alert Behavior During Therapy: WFL for tasks assessed/performed Overall Cognitive Status: Difficult to assess                                 General Comments: patient was noted to have increased fixation on sore on back with patient reporting he did not see MD previous day then when MD enters room patient reported he did see her yesterday. patient demonstrated self limiting behaviors reporting he wants wound to heal first prior to moving even with encouragement from MD and therapist on needing to move     General Comments       Exercises     Shoulder [place] expects to be discharged to:: Private residence Living Arrangements: Spouse/significant other Available  Help at Discharge: Family Type of Home: House Home Access: Stairs to enter Technical brewer of Steps: 1   Home Layout: One level     Bathroom Shower/Tub: Occupational psychologist: Handicapped height     Home Equipment: [place];Shower seat - built in;Cane - single point          Prior Functioning/Environment Prior Level of Function : Independent/Modified Independent                        OT Problem List: Decreased activity tolerance;Impaired balance (sitting and/or standing);Decreased safety awareness;Pain;Decreased knowledge of precautions;Decreased knowledge of use of DME or AE;Cardiopulmonary status limiting activity      OT Treatment/Interventions: Neuromuscular education;Energy conservation;DME and/or AE instruction;Therapeutic activities;Balance training;Patient/family education;Self-care/ADL training    OT Goals(Current goals can be found in the care plan section) Acute Rehab OT Goals Patient Stated Goal: to get wound healed up prior to any movement OT Goal Formulation: With patient Time For Goal Achievement: 09/09/21 Potential to Achieve Goals: Fair  OT Frequency: Min 2X/week    Co-evaluation              AM-PAC OT "6 Clicks" Daily Activity     Outcome  Measure Help from another person eating meals?: A Lot Help from another person taking care of personal grooming?: A Lot Help from another person toileting, which includes using toliet, bedpan, or urinal?: Total Help from another person bathing (including washing, rinsing, drying)?: Total Help from another person to put on and taking off regular upper body clothing?: A Lot Help from another person to put on and taking off regular lower body clothing?: Total 6 Click Score: 9   End of Session Nurse Communication: Other (comment) (patients concerns over wound care)  Activity Tolerance: Other (comment);Patient limited by pain (self limiting behaviors) Patient left: in  bed;with call bell/phone within reach;with bed alarm set;Other (comment) (MD in room)  OT Visit Diagnosis: Pain;Muscle weakness (generalized) (M62.81);Other abnormalities of gait and mobility (R26.89)                Time: 6579-0383 OT Time Calculation (min): 22 min Charges:  OT General Charges $OT Visit: 1 Visit OT Evaluation $OT Eval Low Complexity: 1 Low  Jackelyn Poling OTR/L, MS Acute Rehabilitation Department Office# [phone] Pager# [phone]   Marcellina Millin 08/26/2021, 9:29 AM

## 2021-08-27 DIAGNOSIS — J690 Pneumonitis due to inhalation of food and vomit: Secondary | ICD-10-CM

## 2021-08-27 DIAGNOSIS — I10 Essential (primary) hypertension: Secondary | ICD-10-CM

## 2021-08-27 DIAGNOSIS — E871 Hypo-osmolality and hyponatremia: Secondary | ICD-10-CM | POA: Diagnosis present

## 2021-08-27 DIAGNOSIS — L8942 Pressure ulcer of contiguous site of back, buttock and hip, stage 2: Secondary | ICD-10-CM

## 2021-08-27 LAB — CBC WITH DIFFERENTIAL/PLATELET
Abs Immature Granulocytes: 0.11 10*3/uL — ABNORMAL HIGH (ref 0.00–0.07)
Basophils Absolute: 0 10*3/uL (ref 0.0–0.1)
Basophils Relative: 0 %
Eosinophils Absolute: 0.1 10*3/uL (ref 0.0–0.5)
Eosinophils Relative: 1 %
HCT: 33.3 % — ABNORMAL LOW (ref 39.0–52.0)
Hemoglobin: 11.1 g/dL — ABNORMAL LOW (ref 13.0–17.0)
Immature Granulocytes: 1 %
Lymphocytes Relative: 4 %
Lymphs Abs: 0.5 10*3/uL — ABNORMAL LOW (ref 0.7–4.0)
MCH: 33 pg (ref 26.0–34.0)
MCHC: 33.3 g/dL (ref 30.0–36.0)
MCV: 99.1 fL (ref 80.0–100.0)
Monocytes Absolute: 1.1 10*3/uL — ABNORMAL HIGH (ref 0.1–1.0)
Monocytes Relative: 10 %
Neutro Abs: 9.8 10*3/uL — ABNORMAL HIGH (ref 1.7–7.7)
Neutrophils Relative %: 84 %
Platelets: 181 10*3/uL (ref 150–400)
RBC: 3.36 MIL/uL — ABNORMAL LOW (ref 4.22–5.81)
RDW: 13.2 % (ref 11.5–15.5)
WBC: 11.6 10*3/uL — ABNORMAL HIGH (ref 4.0–10.5)
nRBC: 0 % (ref 0.0–0.2)

## 2021-08-27 LAB — BASIC METABOLIC PANEL
Anion gap: 8 (ref 5–15)
BUN: 20 mg/dL (ref 8–23)
CO2: 25 mmol/L (ref 22–32)
Calcium: 7.3 mg/dL — ABNORMAL LOW (ref 8.9–10.3)
Chloride: 101 mmol/L (ref 98–111)
Creatinine, Ser: 0.64 mg/dL (ref 0.61–1.24)
GFR, Estimated: >60 mL/min (ref >60)
Glucose, Bld: 126 mg/dL — ABNORMAL HIGH (ref 70–99)
Potassium: 3.7 mmol/L (ref 3.5–5.1)
Sodium: 134 mmol/L — ABNORMAL LOW (ref 135–145)

## 2021-08-27 LAB — MAGNESIUM: Magnesium: 2.1 mg/dL (ref 1.7–2.4)

## 2021-08-27 MED ORDER — AMPICILLIN-SULBACTAM SODIUM 3 (2-1) G IJ SOLR
3.0000 g | Freq: Four times a day (QID) | INTRAMUSCULAR | Status: DC
  Administered 2021-08-27 – 2021-08-28 (×3): 3 g via INTRAVENOUS
  Filled 2021-08-27 (×4): qty 8

## 2021-08-27 NOTE — Assessment & Plan Note (Addendum)
Continue with allopurinol 

## 2021-08-27 NOTE — Assessment & Plan Note (Addendum)
Monitor on lasix.  ?

## 2021-08-27 NOTE — Progress Notes (Signed)
Pharmacy Antibiotic Note ? ?[name] is a 81 y.o. male admitted on 08/24/2021 with pneumonia.  He was started on ceftriaxone/azithromycin on admission and has had 4 days of antibiotics.  Pharmacy has now been consulted for Unasyn dosing for aspiration pneumonia. ? ?Plan: ?Unasyn 3g IV q6h ?Follow up renal function, culture results, and clinical course. ? ? ?Height: 6' (182.9 cm) ?Weight: 97.5 kg (215 lb) ?IBW/kg (Calculated) : 77.6 ? ?Temp (24hrs), Avg:98.1 ?F (36.7 ?C), Min:97.8 ?F (36.6 ?C), Max:98.4 ?F (36.9 ?C) ? ?Recent Labs  ?Lab 08/24/21 ?0800 08/24/21 ?1016 08/24/21 ?2349 08/25/21 ?0149 08/26/21 ?0350 08/27/21 ?0354  ?WBC 14.0*  --   --  10.6* 10.9* 11.6*  ?CREATININE 1.13  --   --  0.95 0.80 0.64  ?LATICACIDVEN 2.9* 2.0* 1.3 1.5  --   --   ?  ?Estimated Creatinine Clearance: 87.7 mL/min (by C-G formula based on SCr of 0.64 mg/dL).   ? ?No Known Allergies ? ?Antimicrobials this admission: ?3/3 Ceftriaxone / Azithromycin >>  ?3/6 Unasyn >>  ? ?Dose adjustments this admission: ? ? ?Microbiology results: ?3/3 Covid: neg; Influenza neg ?3/4 MRSA PCR: not detected ?3/3 UCx: < 10k colonies ?3/2 BCx: ngtd ?3/5 Strep pneumo Ur Ag: negative ? ? ?Thank you for allowing pharmacy to be a part of this patient?s care. ? ?Gretta Arab PharmD, BCPS ?Clinical Pharmacist ?[facility] [phone] ?08/27/2021 6:13 PM ? ?

## 2021-08-27 NOTE — Assessment & Plan Note (Addendum)
Multifactorial, patient very deconditioned.  ?Plan to continue nutritional support and transfer to SNF . ?B 12, mg, and Phosphorus normal range. Vitamin D low, started supplement.  ?

## 2021-08-27 NOTE — Progress Notes (Signed)
Physical Therapy Treatment ?Patient Details ?Name: [name] ?MRN: [id number] ?DOB: 1941-06-12 ?Today's Date: 08/27/2021 ? ? ?History of Present Illness H/o chronic lower back, L1 kyphoplasty, Barrett's esophagus, prostate CA s/p radiation ,gout, HTN, AAA, CAD, PE, on xarelto, presented with fever, decrease in appetite ,weakness, increased falls, found to have pneumonia and sepsis. Wife reports fall 2 days before admission and another fall day before admission. ? ?  ?PT Comments  ? ? Pt's breathing is wheezy at rest, RR 33 at rest, RR 44 while sitting at edge of bed, SaO2 93% on room air. Mod assist for supine to sit, pt not able to come to full upright position but leaned posteriorly and to R propped on his R elbow for ~5 minutes at edge of bed. Sitting tolerance limited by 4/4 dyspnea and fatigue.    ?Recommendations for follow up therapy are one component of a multi-disciplinary discharge planning process, led by the attending physician.  Recommendations may be updated based on patient status, additional functional criteria and insurance authorization. ? ?Follow Up Recommendations ? Skilled nursing-short term rehab (<3 hours/day) ?  ?  ?Assistance Recommended at Discharge Frequent or constant Supervision/Assistance  ?Patient can return home with the following Two people to help with walking and/or transfers;Two people to help with bathing/dressing/bathroom;Assist for transportation;Assistance with cooking/housework;Help with stairs or ramp for entrance ?  ?Equipment Recommendations ? None recommended by PT  ?  ?Recommendations for Other Services OT consult ? ? ?  ?Precautions / Restrictions Precautions ?Precautions: Fall ?Precaution Comments: 2 falls last week ?Restrictions ?Weight Bearing Restrictions: No  ?  ? ?Mobility ? Bed Mobility ?  ?Bed Mobility: Supine to Sit, Sit to Supine ?  ?  ?Supine to sit: Mod assist ?Sit to supine: Max assist ?  ?General bed mobility comments: assist to advance BLEs and to raise  trunk, pt unable to come to full upright position, he propped on R elbow and sat at EOB for ~5 minutes, resp rate was 44 in sitting, 33 at rest in supine. His breathing sounded wheezy, RN notified. Pt declined transfers 2* fatigue/weakness. +2 total to scoot up in bed. ?  ? ?Transfers ?  ?  ?  ?  ?  ?  ?  ?  ?  ?  ?  ? ?Ambulation/Gait ?  ?  ?  ?  ?  ?  ?  ?  ? ? ?Stairs ?  ?  ?  ?  ?  ? ? ?Wheelchair Mobility ?  ? ?Modified Rankin (Stroke Patients Only) ?  ? ? ?  ?Balance Overall balance assessment: History of Falls, Needs assistance ?Sitting-balance support: Single extremity supported, Feet supported ?Sitting balance-Leahy Scale: Poor ?  ?  ?  ?  ?  ?  ?  ?  ?  ?  ?  ?  ?  ?  ?  ?  ?  ? ?  ?Cognition Arousal/Alertness: Awake/alert ?Behavior During Therapy: [facility] for tasks assessed/performed ?Overall Cognitive Status: Within Functional Limits for tasks assessed ?  ?  ?  ?  ?  ?  ?  ?  ?  ?  ?  ?  ?  ?  ?  ?  ?  ?  ?  ? ?  ?Exercises General Exercises - Lower Extremity ?Long Arc Quad: AROM, Both, 10 reps, Supine ? ?  ?General Comments   ?  ?  ? ?Pertinent Vitals/Pain Pain Assessment ?Faces Pain Scale: No hurt  ? ? ?Home Living   ?  ?  ?  ?  ?  ?  ?  ?  ?  ?   ?  ?  Prior Function    ?  ?  ?   ? ?PT Goals (current goals can now be found in the care plan section) Acute Rehab PT Goals ?Patient Stated Goal: rehab ?PT Goal Formulation: With family ?Time For Goal Achievement: 09/08/21 ?Potential to Achieve Goals: Good ?Progress towards PT goals: Progressing toward goals ? ?  ?Frequency ? ? ? Min 2X/week ? ? ? ?  ?PT Plan Current plan remains appropriate  ? ? ?Co-evaluation   ?  ?  ?  ?  ? ?  ?AM-PAC PT "6 Clicks" Mobility   ?Outcome Measure ? Help needed turning from your back to your side while in a flat bed without using bedrails?: A Lot ?Help needed moving from lying on your back to sitting on the side of a flat bed without using bedrails?: A Lot ?Help needed moving to and from a bed to a chair (including a wheelchair)?:  Total ?Help needed standing up from a chair using your arms (e.g., wheelchair or bedside chair)?: Total ?Help needed to walk in hospital room?: Total ?Help needed climbing 3-5 steps with a railing? : Total ?6 Click Score: 8 ? ?  ?End of Session   ?Activity Tolerance: Patient limited by fatigue ?Patient left: in bed;with call bell/phone within reach;with bed alarm set ?Nurse Communication: Mobility status ?PT Visit Diagnosis: History of falling (Z91.81);Muscle weakness (generalized) (M62.81);Difficulty in walking, not elsewhere classified (R26.2);Unsteadiness on feet (R26.81) ?  ? ? ?Time: 2703-5009 ?PT Time Calculation (min) (ACUTE ONLY): 15 min ? ?Charges:  $Therapeutic Activity: 8-22 mins          ?          ? ?Philomena Doheny PT 08/27/2021  ?Acute Rehabilitation Services ?Pager [phone] ?Office [phone] ? ? ?

## 2021-08-27 NOTE — Assessment & Plan Note (Addendum)
Continue  with rivaroxaban.  ?

## 2021-08-27 NOTE — Consult Note (Addendum)
WOC Nurse Consult Note: ?Reason for Consult: Consult requested for back wound.  There are 2 areas which patient states have been present "for awhile." ?Wound type: Stage 2 pressure injury to right lower back, 2X1.5X.2cm, 90% red, 10% yellow and moist.  Mod amt tan drainage.  Located in close proximity is a partial thickness wound; .2X.2X.1cm, yellow and moist, mod amt tan drainage. ?Pressure Injury POA: Yes ?Dressing procedure/placement/frequency: Topical treatment orders provided for bedside nurses to perform to protect from further injury: Foam dressing to 2 back wounds, change Q 3 days or PRN soiling ?Please re-consult if further assistance is needed.  Thank-you,  ?Julien Girt MSN, RN, [place], [place], CNS ?[phone]  ? ? ?  ?

## 2021-08-27 NOTE — Assessment & Plan Note (Addendum)
Sepsis present on admission.  ?Presents with tachypnea, tachycardia, leukocytosis, lactic acidosis. chest x ray with bibasilar airspace diseases. Admitted with PNA>  ?-Evaluated by speech, who recommend aspiration precaution  ?-Plan to complete 3 more days of Augmentin.  ? ?

## 2021-08-27 NOTE — Care Management Important Message (Signed)
Important Message ? ?Patient Details IM Letter given to the Patient. ?Name: [name] ?MRN: [id number] ?Date of Birth: 01/14/1941 ? ? ?Medicare Important Message Given:  Yes ? ? ? ? ?Kerin Salen ?08/27/2021, 10:27 AM ?

## 2021-08-27 NOTE — Assessment & Plan Note (Addendum)
Positive pressure injury at his lower back, it is related to a local lipoma. ?Patient has been scheduled for resection as outpatient. ?Surgery has been consulted with recommendations for wound care team consult. ?Avoid pressure on wound. ?Follow up as outpatient, no current indication for surgery during his hospitalization.  ?

## 2021-08-27 NOTE — Assessment & Plan Note (Addendum)
Now on lasix. Resume ARB ? ? ?

## 2021-08-27 NOTE — NC FL2 (Signed)
?[place] MEDICAID FL2 LEVEL OF CARE SCREENING TOOL  ?  ? ?IDENTIFICATION  ?Patient Name: ?[name] Birthdate: 1940-09-05 Sex: male Admission Date (Current Location): ?08/24/2021  ?[place] and [place] Number: ? [place] ?  Facility and Address:  ?[facility],  [place], [place] ?     Provider Number: ?[id number]  ?Attending Physician Name and Address:  ?Arrien, Jimmy Picket,* ? Relative Name and Phone Number:  ?  ?   ?Current Level of Care: ?Hospital Recommended Level of Care: ?[place] Prior Approval Number: ?  ? ?Date Approved/Denied: ?  PASRR Number: ?  ? ?Discharge Plan: ?SNF ?  ? ?Current Diagnoses: ?Patient Active Problem List  ? Diagnosis Date Noted  ? Pressure injury of skin 08/25/2021  ? Sepsis ([place]) 08/24/2021  ? Generalized weakness 08/24/2021  ? Falls 08/24/2021  ? Heme positive stool   ? Polyp of ascending colon   ? GI bleed 07/09/2018  ? History of pulmonary embolus (PE)   ? History of esophageal stricture   ? Bilateral lower extremity edema   ? Anticoagulant long-term use   ? History of DVT of lower extremity   ? Lumbar compression fracture ([place]) 12/23/2017  ? Near syncope 10/23/2017  ? History of colonic polyps   ? Diarrhea 07/01/2014  ? Rectal bleeding 06/28/2014  ? Positive blood cultures 05/26/2014  ? Bacteremia due to Gram-positive bacteria 05/26/2014  ? Leukopenia 05/26/2014  ? Neutropenia ([place]) 05/26/2014  ? DVT, lower extremity, distal, acute ([place]) 04/19/2014  ? Bilateral pulmonary embolism ([place]) 04/14/2014  ? Acute pulmonary embolism ([place]) 04/14/2014  ? Pulmonary HTN ([place]) 04/12/2014  ? Infected sebaceous cyst back 7x5x4cm s/p I&D [id number] 01/11/2013  ? Abdominal aortic aneurysm 05/25/2012  ? CAD (coronary artery disease) 05/25/2012  ? Dyspnea 03/30/2012  ? Hypertension 03/30/2012  ? Bruit 03/30/2012  ? Prostate cancer ([place]) 02/03/2012  ? GERD 11/26/2007  ? Malignant neoplasm of colon ([place]) 11/25/2007  ? Gout 11/25/2007  ? ESOPHAGEAL  STRICTURE 11/25/2007  ? BARRETTS ESOPHAGUS 11/25/2007  ? DUODENITIS, WITHOUT HEMORRHAGE 11/25/2007  ? DIVERTICULOSIS OF COLON 11/25/2007  ? ABDOMINAL PAIN, EPIGASTRIC 11/25/2007  ? FOREIGN BODY IN ESOPHAGUS 11/25/2007  ? COLONIC POLYPS, ADENOMATOUS, HX OF 11/25/2007  ? ? ?Orientation RESPIRATION BLADDER Height & Weight   ?  ?Self, Time, Situation, Place ? O2 (2L [place]) Incontinent Weight: 97.5 kg ?Height:  6' (182.9 cm)  ?BEHAVIORAL SYMPTOMS/MOOD NEUROLOGICAL BOWEL NUTRITION STATUS  ?    Incontinent Diet (regular)  ?AMBULATORY STATUS COMMUNICATION OF NEEDS Skin   ?Limited Assist Verbally PU Stage and Appropriate Care (Stage 2 pressure injury to right lower back.) ?  ?  ?  ?    ?     ?     ? ? ?Personal Care Assistance Level of Assistance  ?Bathing, Feeding, Dressing Bathing Assistance: Limited assistance ?Feeding assistance: Independent ?Dressing Assistance: Limited assistance ?   ? ?Functional Limitations Info  ?Sight, Hearing, Speech Sight Info: Impaired ?Hearing Info: Impaired ?Speech Info: Adequate  ? ? ?SPECIAL CARE FACTORS FREQUENCY  ?PT (By licensed PT), OT (By licensed OT)   ?  ?PT Frequency: 5x/week ?OT Frequency: 5x/week ?  ?  ?  ?   ? ? ?Contractures Contractures Info: Not present  ? ? ?Additional Factors Info  ?Code Status, Allergies Code Status Info: Full ?Allergies Info: no known allergies ?  ?  ?  ?   ? ?Current Medications (08/27/2021):  This is the current hospital active medication list ?Current  Facility-Administered Medications  ?Medication Dose Route Frequency Provider Last Rate Last Admin  ? (feeding supplement) PROSource Plus liquid 30 mL  30 mL Oral BID BM Florencia Reasons, MD   30 mL at 08/27/21 1021  ? acetaminophen (TYLENOL) tablet 650 mg  650 mg Oral Q6H PRN Cherylann Ratel A, DO      ? Or  ? acetaminophen (TYLENOL) suppository 650 mg  650 mg Rectal Q6H PRN Marylyn Ishihara, Tyrone A, DO      ? allopurinol (ZYLOPRIM) tablet 300 mg  300 mg Oral q morning Florencia Reasons, MD   300 mg at 08/27/21 1027  ? azithromycin  (ZITHROMAX) 500 mg in sodium chloride 0.9 % 250 mL IVPB  500 mg Intravenous Q24H Kyle, Tyrone A, DO 250 mL/hr at 08/26/21 1209 500 mg at 08/26/21 1209  ? cefTRIAXone (ROCEPHIN) 2 g in sodium chloride 0.9 % 100 mL IVPB  2 g Intravenous Q24H Kyle, Tyrone A, DO 200 mL/hr at 08/27/21 1044 2 g at 08/27/21 1044  ? feeding supplement (ENSURE ENLIVE / ENSURE PLUS) liquid 237 mL  237 mL Oral BID BM Kyle, Tyrone A, DO   237 mL at 08/25/21 1405  ? levalbuterol (XOPENEX) nebulizer solution 0.63 mg  0.63 mg Nebulization Q8H PRN Lovey Newcomer T, NP   0.63 mg at 08/24/21 2348  ? MEDLINE mouth rinse  15 mL Mouth Rinse BID Marylyn Ishihara, Tyrone A, DO   15 mL at 08/27/21 1022  ? multivitamin with minerals tablet 1 tablet  1 tablet Oral Daily Florencia Reasons, MD   1 tablet at 08/27/21 1021  ? ondansetron (ZOFRAN) tablet 4 mg  4 mg Oral Q6H PRN Cherylann Ratel A, DO      ? Or  ? ondansetron (ZOFRAN) injection 4 mg  4 mg Intravenous Q6H PRN Marylyn Ishihara, Tyrone A, DO      ? rivaroxaban (XARELTO) tablet 20 mg  20 mg Oral Q breakfast Florencia Reasons, MD   20 mg at 08/27/21 1020  ? thiamine tablet 100 mg  100 mg Oral Daily Florencia Reasons, MD   100 mg at 08/27/21 1020  ? ? ? ?Discharge Medications: ?Please see discharge summary for a list of discharge medications. ? ?Relevant Imaging Results: ? ?Relevant Lab Results: ? ? ?Additional Information ?Covid vaccinations: [place] x4; SS# [id number] ? ?Tawanna Cooler, RN ? ? ? ? ?

## 2021-08-27 NOTE — Hospital Course (Addendum)
[name] was admitted to the hospital with the working diagnosis of sepsis due to pneumonia, complicated with acute diastolic heart failure  ? ?81 yo male with the past medical history of hypertension, abdominal aortic aneurysm, Barrett's esophagus who presented with fever and weakness for few days. Positive fever at home with frequent falls. On his initial physical examination his blood pressure was 128/58, HR 95, RR 16 and oxygen saturation 98%, heart with S1 and S2 present and tachycardic, lungs with scattered wheezing and increased work of breathing, abdomen soft and no lower extremity edema.  ? ?Chest radiograph with bilateral atelectasis at bases, more at the right lower lobe.  ? ?He developed volume overload consistent with acute diastolic heart failure, started on diuresis to target negative fluid balance. He had good urine out put yesterday 2.8 L urine. He will be transition to oral lasix. He denies worsening dyspnea.  ?03/07 transition to oral antibiotic therapy. ?He is medical stable to be transfer to SNF>  ?

## 2021-08-27 NOTE — Progress Notes (Signed)
?Progress Note ? ? ?Patient: [name] HWE:[id number] DOB: Dec 12, 1940 DOA: 08/24/2021     3 ?DOS: the patient was seen and examined on 08/27/2021 ?  ?Brief hospital course: ?[name] was admitted to the hospital with the working diagnosis of sepsis due to pneumonia.  ? ?81 yo male with the past medical history of hypertension, abdominal aortic aneurysm, Barrett's esophagus who presented with fever and weakness for few days. Positive fever at home with frequent falls. On his initial physical examination his blood pressure was 128/58, HR 95, RR 16 and oxygen saturation 98%, heart with S1 and S2 present and tachycardic, lungs with scattered wheezing and increased work of breathing, abdomen soft and no lower extremity edema.  ? ?Na 130, K 4,3, CL 99, bicarbonate 17, glucose 134, bun 26 and cr 1,13 ?Lactic acid 2,9  ?Wbc 14,0, Hgb 12.4, Hct 37 plt 159  ?Sars covid 19 negative  ? ?Urine analysis with specific gravity 1,020 with 0-5 wbc  ? ?Chest radiograph with bilateral atelectasis at bases, more at the right lower lobe.  ? ?EKG 118 bpm, left axis deviation, right bundle branch block, sinus rhythm with no significant ST segment or T wave changes.  ? ?Patient was placed on antibiotic therapy to cover pulmonary pathogens.  ? ?Assessment and Plan: ?* Aspiration pneumonia ([place]) ?Sepsis present on admission.  ? ?Patient continue to have cough, limited mobility due to back pain. ? ?Wbc is 11.6, he has been afebrile.  ? ?Plan to change antibiotic therapy to Unasyn for aspiration pneumonia ?Consult speech for swallow evaluation. ?Today not able to participate in therapy due to increased work of breathing.  ? ? ?Pressure injury of skin ?Positive pressure injury at his lower back, it is related to a local lipoma. ?Patient has been scheduled for resection as outpatient. ?Will consult [facility] surgery for inpatient follow up.  ?Continue pain control, no local sings of wound infection. ?Continue local wound care.    ? ?Hypertension ?Continue blood pressure monitoring ?Patient off antihypertensive medications.  ? ?History of pulmonary embolus (PE) ?Continue anticoagulation with rivaroxaban.  ? ?Gout ?No acute flare, continue with allopurinol.  ? ?Generalized weakness ?Patient deconditioned, poor mobility, dependent edema. ?Consult nutrition for evaluation, continue with pt and ot.  ? ?Hyponatremia ?Patient is tolerating po well, renal function stable with serum cr at 0,64, K is 3,7 and Na 134. ?Plan to follow up renal function in am.  ? ? ? ? ?  ? ?Subjective: patient complains of back pain, worse with movement at the site of the lipoma.  ? ?Physical Exam: ?Vitals:  ? 08/26/21 2014 08/27/21 0517 08/27/21 1300 08/27/21 1504  ?BP: (!) 146/71 118/64 112/64   ?Pulse: (!) 105 88 85   ?Resp: '20 20 20 '$ (!) 33  ?Temp: 98.4 ?F (36.9 ?C) 98.2 ?F (36.8 ?C) 97.8 ?F (36.6 ?C)   ?TempSrc: Oral Oral    ?SpO2: 97% 98% 94% 93%  ?Weight:      ?Height:      ? ?Neurology awake and alert ?ENT with no pallor ?Cardiovascular with S1 and S2 present and rhythmic with no gallops or murmurs ?No JVD ?Positive trace pitting bilateral lower extremity edema. ?Respiratory with positive expiratory wheezing on the right side and scattered rales bilaterally ?Abdomen soft  ?Data Reviewed: ? ? ? ?Family Communication: I spoke with patient's wife  at the bedside, we talked in detail about patient's condition, plan of care and prognosis and all questions were addressed. ? ? ?Disposition: ?Status is: Inpatient ?Remains  inpatient appropriate because: IV antibiotic therapy  ? Planned Discharge Destination: Skilled nursing facility ? ? ? ?Author: ?Tawni Millers, MD ?08/27/2021 5:52 PM ? ?For on call review [email or web address].  ?

## 2021-08-28 ENCOUNTER — Encounter (HOSPITAL_COMMUNITY): Payer: Self-pay | Admitting: Internal Medicine

## 2021-08-28 ENCOUNTER — Encounter: Payer: Self-pay | Admitting: Gastroenterology

## 2021-08-28 DIAGNOSIS — I5033 Acute on chronic diastolic (congestive) heart failure: Secondary | ICD-10-CM | POA: Diagnosis not present

## 2021-08-28 LAB — BASIC METABOLIC PANEL
Anion gap: 7 (ref 5–15)
BUN: 24 mg/dL — ABNORMAL HIGH (ref 8–23)
CO2: 26 mmol/L (ref 22–32)
Calcium: 7.1 mg/dL — ABNORMAL LOW (ref 8.9–10.3)
Chloride: 100 mmol/L (ref 98–111)
Creatinine, Ser: 0.74 mg/dL (ref 0.61–1.24)
GFR, Estimated: >60 mL/min (ref >60)
Glucose, Bld: 119 mg/dL — ABNORMAL HIGH (ref 70–99)
Potassium: 3.5 mmol/L (ref 3.5–5.1)
Sodium: 133 mmol/L — ABNORMAL LOW (ref 135–145)

## 2021-08-28 LAB — CBC
HCT: 31.6 % — ABNORMAL LOW (ref 39.0–52.0)
Hemoglobin: 10.5 g/dL — ABNORMAL LOW (ref 13.0–17.0)
MCH: 33.3 pg (ref 26.0–34.0)
MCHC: 33.2 g/dL (ref 30.0–36.0)
MCV: 100.3 fL — ABNORMAL HIGH (ref 80.0–100.0)
Platelets: 214 10*3/uL (ref 150–400)
RBC: 3.15 MIL/uL — ABNORMAL LOW (ref 4.22–5.81)
RDW: 13.3 % (ref 11.5–15.5)
WBC: 10.4 10*3/uL (ref 4.0–10.5)
nRBC: 0 % (ref 0.0–0.2)

## 2021-08-28 LAB — LEGIONELLA PNEUMOPHILA SEROGP 1 UR AG: L. pneumophila Serogp 1 Ur Ag: NEGATIVE

## 2021-08-28 MED ORDER — ENSURE ENLIVE PO LIQD
237.0000 mL | Freq: Two times a day (BID) | ORAL | Status: DC
  Administered 2021-08-29 (×2): 237 mL via ORAL

## 2021-08-28 MED ORDER — BOOST / RESOURCE BREEZE PO LIQD CUSTOM
1.0000 | Freq: Three times a day (TID) | ORAL | Status: DC
  Administered 2021-08-28 – 2021-08-30 (×6): 1 via ORAL

## 2021-08-28 MED ORDER — FUROSEMIDE 10 MG/ML IJ SOLN
40.0000 mg | Freq: Two times a day (BID) | INTRAMUSCULAR | Status: DC
  Administered 2021-08-28 – 2021-08-29 (×2): 40 mg via INTRAVENOUS
  Filled 2021-08-28 (×3): qty 4

## 2021-08-28 MED ORDER — AMOXICILLIN-POT CLAVULANATE 875-125 MG PO TABS
1.0000 | ORAL_TABLET | Freq: Two times a day (BID) | ORAL | Status: DC
  Administered 2021-08-28 – 2021-08-30 (×4): 1 via ORAL
  Filled 2021-08-28 (×4): qty 1

## 2021-08-28 NOTE — Progress Notes (Signed)
?   08/28/21 1300  ?SLP Assessment  ?Clinical Impression Statement (ACUTE ONLY) Patient presents with functional oropharyngeal swallow ability. Observed pt consuming water and pills - (one at a time per pt request) No s/s of aspiration and no focal CN deficits present.  He did advised that he "choked" on eggs yesterday - but states is happens rarely.  He declined to consume any solids stating "I just ate breakfast"- meal tray was empty.  Given pt's primary "choking" occurs with foods - suspect his esophageal dysphagia may play a role.  He declined any desire for further testing  thus SLP reviewed basic precautions.. Regular/thin diet advised -  wife denies pt coughing with any intake at home. Advised pt to follow up with GI if concern for refluxing worsens - as frequent belching observed during intake.  ?SLP Visit Diagnosis Dysphagia, unspecified (R13.10)  ?Impact on safety and function Mild aspiration risk  ?Swallow Evaluation Recommendations  ?SLP Diet Recommendations Regular;Thin liquid  ?Liquid Administration via Straw;Cup  ?Medication Administration Whole meds with liquid  ?Supervision Patient able to self feed  ?Compensations Slow rate;Small sips/bites  ?Postural Changes Seated upright at 90 degrees;Remain upright for at least 30 minutes after po intake  ?Treatment Plan  ?Oral Care Recommendations Oral care BID  ?Individuals Consulted  ?Consulted and Agree with Results and Recommendations Patient;Family member/caregiver  ?Family Member Consulted wife  ?SLP Time Calculation  ?SLP Start Time (ACUTE ONLY) 1043  ?SLP Stop Time (ACUTE ONLY) 1103  ?SLP Time Calculation (min) (ACUTE ONLY) 20 min  ?SLP Evaluations  ?$ SLP Speech Visit 1 Visit  ?SLP Evaluations  ?$BSS Swallow 1 Procedure  ? ?Kathleen Lime, MS CCC SLP ?Acute Rehab Services ?Office [phone] ?Pager [phone] ? ?

## 2021-08-28 NOTE — Consult Note (Addendum)
[name] [birth year]  [id number].    Requesting MD: Dr. Riccardo Dubin Arrien Chief Complaint/Reason for Consult: back lipoma and wound  HPI:  This is an 81 yo male with multiple medical problems who is currently hospitalized for aspiration pneumonia secondary to dysphagia.  The patient has had a known lipoma on his back in his mid-thoracic region.  Recently he has developed a wound near this area.  It is bothering him and so we were asked to evaluate this area in relation to his lipoma.  ROS: ROS: Please see HPI  Family History  Problem Relation Age of Onset   Macular degeneration Mother    Osteoarthritis Mother    Aneurysm Father        d/o 38 yo, ruptured AAA   Heart disease Other        No family history   Colon cancer Neg Hx    Colon polyps Neg Hx    Diabetes Neg Hx     Past Medical History:  Diagnosis Date   AAA (abdominal aortic aneurysm) followed by dr Stanford Breed   last duplex 07-17-2017  3.2 x 3.3   Anticoagulant long-term use    xarelto for hx PEs and DVT   Arthritis    back   Barrett's esophagus 2009   Bilateral lower extremity edema    chronic pedal   CAD (coronary artery disease) cardioloigst--  dr Stanford Breed   cardiac cath 04-12-2014  single vessel obstructive cad involving ostrium D2, and other nonobstructive cad , normall lvf   Chronic lower back pain    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[place])    per duplex 07-17-2017  left cia 2 x 2, right cia 2.1 x 2   Diverticulosis    Dyspnea    with exertion    Dyspnea on exertion    FH: colonic polyps    GERD (gastroesophageal reflux disease)    Gout    08-27-2017  per pt stable   History of adenomatous polyp of colon    History of DVT of lower extremity    10/ 2015  LLE   History of esophageal stricture    post dilation   History of gastric polyp    benign   History of pulmonary embolus (PE)    2005  treated 1 yr w/ coumadin and 10/ 2015  bilateral   History of radiation therapy 06/22/2012-08/18/2012   78 Gy to  prostate (external beam)   History of subdural hematoma 03/2012   s/p  evacuation   Hypertension    Lesion of bladder    non canerous   Prostate cancer [facility]) urologist-  dr wrenn/ oncologist- dr Sondra Come   dx 07/ 2013--  Stage T1c, Gleson 7, vol 67cc--- completed external beam radiation therapy 08-18-2012   Pulmonary HTN ([place]) 04/12/2014   last echo 05-01-2017  PASP 39mHg   RBBB (right bundle branch block with left anterior fascicular block)    ocassional    Past Surgical History:  Procedure Laterality Date   BIOPSY  07/11/2018   Procedure: BIOPSY;  Surgeon: JMilus Banister MD;  [facility]: [facility];  Service: Endoscopy;;   CATARACT EXTRACTION W/ INTRAOCULAR LENS  IMPLANT, BILATERAL  2018   COLONOSCOPY WITH PROPOFOL N/A 04/25/2015   Procedure: COLONOSCOPY WITH PROPOFOL;  Surgeon: SManus Gunning MD;  [facility];  Service: Gastroenterology;  Laterality: N/A;   COLONOSCOPY WITH PROPOFOL N/A 07/11/2018   Procedure: COLONOSCOPY WITH PROPOFOL;  Surgeon: JMilus Banister MD;  [facility];  Service: Endoscopy;  Laterality: N/A;   CRANIOTOMY  04/03/2012   Procedure: CRANIOTOMY HEMATOMA EVACUATION SUBDURAL;  Surgeon: Erline Levine, MD;  Location: [facility];  Service: Neurosurgery;  Laterality: Left;  Left Craniotomy for Evacuation of Subdural Hematoma   CYSTOSCOPY WITH BIOPSY N/A 09/02/2017   Procedure: CYSTOSCOPY WITH BIOPSY AND FULGURATION POSSIBLE TRANSURETHRAL RESECTION OF BLADDER TUMOR;  Surgeon: [name], MD;  [facility];  Service: Urology;  Laterality: N/A;   ESOPHAGOGASTRODUODENOSCOPY (EGD) WITH PROPOFOL N/A 04/25/2015   Procedure: ESOPHAGOGASTRODUODENOSCOPY (EGD) WITH PROPOFOL;  Surgeon: Manus Gunning, MD;  [facility];  Service: Gastroenterology;  Laterality: N/A;   ESOPHAGOGASTRODUODENOSCOPY (EGD) WITH PROPOFOL N/A 07/11/2018   Procedure: ESOPHAGOGASTRODUODENOSCOPY (EGD) WITH PROPOFOL;  Surgeon: Milus Banister, MD;  [facility];  Service: Endoscopy;  Laterality: N/A;   IRRIGATION AND DEBRIDEMENT SEBACEOUS CYST  01-11-13   "off my back"   KYPHOPLASTY N/A 12/23/2017   Procedure: Lumbar One KYPHOPLASTY;  Surgeon: Erline Levine, MD;  Location: [facility];  Service: Neurosurgery;  Laterality: N/A;  Lumbar One KYPHOPLASTY   LEFT AND RIGHT HEART CATHETERIZATION WITH CORONARY ANGIOGRAM N/A 04/12/2014   Procedure: LEFT AND RIGHT HEART CATHETERIZATION WITH CORONARY ANGIOGRAM;  Surgeon: Peter M Martinique, MD;  [facility];  Service: Cardiovascular;  Laterality: N/A;   ORIF FEMUR FRACTURE Left 10-02-2010   dr Alvan Dame   subtrochanteric   POLYPECTOMY  07/11/2018   Procedure: POLYPECTOMY;  Surgeon: Milus Banister, MD;  [facility];  Service: Endoscopy;;   TONSILLECTOMY  1940's   TRANSTHORACIC ECHOCARDIOGRAM  05/01/2017   mild concentric LVH, ef 09-73%, grade 1 diastolic dysfunction/ trivial AR/ mild dilated ascending aorta/ mild to moderate MR (no stenosis)/ mild LAE/ mild TR/ mild to moderate increase PASP 3mHg    Social History:  reports that he quit smoking about 49 years ago. His smoking use included cigarettes. He has a 20.00 pack-year smoking history. He has never used smokeless tobacco. He reports current alcohol use of about 7.0 standard drinks per week. He reports that he does not use drugs.  Allergies: No Known Allergies  Medications Prior to Admission  Medication Sig Dispense Refill   allopurinol (ZYLOPRIM) 300 MG tablet Take 300 mg by mouth every morning.      ferrous sulfate 325 (65 FE) MG tablet Take 1 tablet (325 mg total) by mouth daily for 30 days. (Patient taking differently: Take 325 mg by mouth daily with breakfast.) 30 tablet 0   gentamicin cream (GARAMYCIN) 0.1 % Apply 1 application topically See admin instructions. Apply to sacral-area pressure sores 2 times a day     olmesartan (BENICAR) 20 MG tablet TAKE 1 TABLET(20 MG) BY MOUTH DAILY (Patient taking  differently: Take 20 mg by mouth in the morning.) 90 tablet 3   omeprazole (PRILOSEC) 20 MG capsule Take 1 capsule (20 mg total) by mouth daily. Please keep your March Appointment for further refills. Thank you (Patient taking differently: Take 20 mg by mouth daily before breakfast.) 30 capsule 1   XARELTO 20 MG TABS tablet TAKE 1 TABLET(20 MG) BY MOUTH DAILY WITH SUPPER (Patient taking differently: Take 20 mg by mouth daily with breakfast.) 90 tablet 1     Physical Exam: Blood pressure 126/67, pulse 88, temperature 97.6 F (36.4 C), temperature source Oral, resp. rate (!) 24, height 6' (1.829 m), weight 97.5 kg, SpO2 98 %. General: elderly frail, white male who is laying in bed in NAD MS: all 4  extremities are symmetrical.  Patient with significant kyphosis of his thoracic spine.  He has a small stage I appearing wound over his mid thoracic spine.  This is over a bony prominence.  The patient is not able to mobilize or turn well.  It is difficult to fully assess his lipoma, but this does not appear related to the wound as it is more lateral than the wound. Skin: warm and dry with no masses, lesions, or rashes, see above as well Psych: A&Ox3 with an appropriate affect.   Results for orders placed or performed during the hospital encounter of 08/24/21 (from the past 48 hour(s))  CBC with Differential/Platelet     Status: Abnormal   Collection Time: 08/27/21  3:54 AM  Result Value Ref Range   WBC 11.6 (H) 4.0 - 10.5 K/uL   RBC 3.36 (L) 4.22 - 5.81 MIL/uL   Hemoglobin 11.1 (L) 13.0 - 17.0 g/dL   HCT 33.3 (L) 39.0 - 52.0 %   MCV 99.1 80.0 - 100.0 fL   MCH 33.0 26.0 - 34.0 pg   MCHC 33.3 30.0 - 36.0 g/dL   RDW 13.2 11.5 - 15.5 %   Platelets 181 150 - 400 K/uL   nRBC 0.0 0.0 - 0.2 %   Neutrophils Relative % 84 %   Neutro Abs 9.8 (H) 1.7 - 7.7 K/uL   Lymphocytes Relative 4 %   Lymphs Abs 0.5 (L) 0.7 - 4.0 K/uL   Monocytes Relative 10 %   Monocytes Absolute 1.1 (H) 0.1 - 1.0 K/uL    Eosinophils Relative 1 %   Eosinophils Absolute 0.1 0.0 - 0.5 K/uL   Basophils Relative 0 %   Basophils Absolute 0.0 0.0 - 0.1 K/uL   Immature Granulocytes 1 %   Abs Immature Granulocytes 0.11 (H) 0.00 - 0.07 K/uL    Comment: Performed at [facility], [place] [address], [place], [place] [address]  Basic metabolic panel     Status: Abnormal   Collection Time: 08/27/21  3:54 AM  Result Value Ref Range   Sodium 134 (L) 135 - 145 mmol/L   Potassium 3.7 3.5 - 5.1 mmol/L   Chloride 101 98 - 111 mmol/L   CO2 25 22 - 32 mmol/L   Glucose, Bld 126 (H) 70 - 99 mg/dL    Comment: Glucose reference range applies only to samples taken after fasting for at least 8 hours.   BUN 20 8 - 23 mg/dL   Creatinine, Ser 0.64 0.61 - 1.24 mg/dL   Calcium 7.3 (L) 8.9 - 10.3 mg/dL   GFR, Estimated >60 >60 mL/min    Comment: (NOTE) Calculated using the CKD-EPI Creatinine Equation (2021)    Anion gap 8 5 - 15    Comment: Performed at [facility], [place] [address] [place], [place] [address]  Magnesium     Status: None   Collection Time: 08/27/21  3:54 AM  Result Value Ref Range   Magnesium 2.1 1.7 - 2.4 mg/dL    Comment: Performed at [facility], [place] [address], [place], [place] [address]  CBC     Status: Abnormal   Collection Time: 08/28/21  4:05 AM  Result Value Ref Range   WBC 10.4 4.0 - 10.5 K/uL   RBC 3.15 (L) 4.22 - 5.81 MIL/uL   Hemoglobin 10.5 (L) 13.0 - 17.0 g/dL   HCT 31.6 (L) 39.0 - 52.0 %   MCV 100.3 (H) 80.0 - 100.0 fL   MCH  33.3 26.0 - 34.0 pg   MCHC 33.2 30.0 - 36.0 g/dL   RDW 13.3 11.5 - 15.5 %   Platelets 214 150 - 400 K/uL   nRBC 0.0 0.0 - 0.2 %    Comment: Performed at [facility], [place] [address] [place], [place] [address]  Basic metabolic panel     Status: Abnormal   Collection Time: 08/28/21  4:05 AM  Result Value Ref Range   Sodium 133 (L) 135 - 145 mmol/L   Potassium 3.5 3.5 - 5.1 mmol/L   Chloride 100  98 - 111 mmol/L   CO2 26 22 - 32 mmol/L   Glucose, Bld 119 (H) 70 - 99 mg/dL    Comment: Glucose reference range applies only to samples taken after fasting for at least 8 hours.   BUN 24 (H) 8 - 23 mg/dL   Creatinine, Ser 0.74 0.61 - 1.24 mg/dL   Calcium 7.1 (L) 8.9 - 10.3 mg/dL   GFR, Estimated >60 >60 mL/min    Comment: (NOTE) Calculated using the CKD-EPI Creatinine Equation (2021)    Anion gap 7 5 - 15    Comment: Performed at [facility], [place] [address], [place], [place] [address]   No results found.    Assessment/Plan Stage I mid-thoracic pressure wound/mid back lipoma The patient has been seen and his chart and imaging reviewed.  He appears to have a small stage I mid thoracic pressure wound secondary to a bony prominence from his significant kyphosis.  His lipoma/fatty tissue is lateral to this wound.  Lipomas do not typically cause issues with wounds or wound development.  I suspect his lipoma is not contributing to this wound, but pressure from sitting in his chair most of the day against this bony prominence is causing this breakdown.  Would not recommend lipoma removal at this time, but he can certainly follow up with a surgeon in our office to discuss this if he would like.  For an elective surgery, he certainly would not be low risk given his multiple medical problems.  I have discussed this case with Dr. Cathlean Sauer and recommended a WOC, RN consult to see if there are any types of dressings, padding, etc to help off-load pressure at this site to help with pain control and wound healing.  No further general surgery needs.  We will sign off.  PE, on xarelto Barrett's esophagus CAD HTN Prostate cancer Pulm HTN RBB Dysphagia with aspiration PNA  I reviewed hospitalist notes, last 24 h labs and trends, and last 24 h imaging results.  Henreitta Cea, [facility] Surgery 08/28/2021, 3:18 PM Please see Amion for pager number during day hours  7:00am-4:30pm or 7:00am -11:30am on weekends

## 2021-08-28 NOTE — Progress Notes (Signed)
Nutrition Follow-up ? ?DOCUMENTATION CODES:  ? ?Not applicable ? ?INTERVENTION:  ?- will d/c Prosource Plus. ?- continue Ensure Plus High Protein BID, each supplement provides 350 kcal and 20 grams of protein. ?- will order Boost Breeze TID, each supplement provides 250 kcal and 9 grams of protein. ?- weigh patient today.  ?- complete NFPE when feasible.  ? ? ?NUTRITION DIAGNOSIS:  ? ?Increased nutrient needs related to acute illness as evidenced by estimated needs. -ongoing ? ?GOAL:  ? ?Patient will meet greater than or equal to 90% of their needs -minimally met on average ? ?MONITOR:  ? ?PO intake, Supplement acceptance, Labs, Weight trends, Skin ? ?REASON FOR ASSESSMENT:  ? ?Consult ?Assessment of nutrition requirement/status ? ?ASSESSMENT:  ? ?Pt admitted from home with fever and weakness secondary to PNA and sepsis. PMH includes HTN, AAA, CAD, PE on xarelto, Barrett's esophagus and prostate CA s/p radiation. ? ?Unable to see patient x2 attempts this afternoon as other staff were in patient's room. ? ?Review of flow sheet indicates that he ate 100% of breakfast and lunch yesterday and 100% of breakfast today.  ? ?He has been accepting Ensure ~75% of the time offered and Prosource ~30% of the time offered.  ? ?He has not been weighed since 3/3 and weight on that date appeared to be a stated weight and/or copied forward from 04/10/21. Non-pitting edema to BLE documented in the edema section of flow sheet.  ? ?SLP saw patient this afternoon and recommendation is for current diet order: Regular, thin liquids.  ? ? ?Labs reviewed; Na: 133 mmol/l, BUN: 24 mg/dl, Ca: 7.1 mg/dl. ? ?Medications reviewed; 1 tablet multivitamin with minerals/day, 100 mg oral thiamine/day. ?  ? ?NUTRITION - FOCUSED PHYSICAL EXAM: ? ?Unable to complete at this time.  ? ?Diet Order:   ?Diet Order   ? ?       ?  Diet regular Room service appropriate? Yes; Fluid consistency: Thin  Diet effective now       ?  ? ?  ?  ? ?  ? ? ?EDUCATION NEEDS:   ? ?No education needs have been identified at this time ? ?Skin:  Skin Assessment: Skin Integrity Issues: ?Skin Integrity Issues:: Stage II ?Stage II: R lower back ? ?Last BM:  3/5 (type 6, medium amount) ? ?Height:  ? ?Ht Readings from Last 1 Encounters:  ?08/24/21 6' (1.829 m)  ? ? ?Weight:  ? ?Wt Readings from Last 1 Encounters:  ?08/24/21 97.5 kg  ? ? ? ?BMI:  Body mass index is 29.16 kg/m?. ? ?Estimated Nutritional Needs:  ?Kcal:  2200-2400 ?Protein:  110-125g ?Fluid:  >/=2.2L ? ? ? ? ?Jarome Matin, MS, RD, LDN ?Inpatient Clinical Dietitian ?RD pager # available in [place]  ?After hours/weekend pager # available in [place] ? ?

## 2021-08-28 NOTE — Progress Notes (Addendum)
?Progress Note ? ? ?Patient: [name] MCN:[id number] DOB: 1940/09/12 DOA: 08/24/2021     4 ?DOS: the patient was seen and examined on 08/28/2021 ?  ?Brief hospital course: ?[name] was admitted to the hospital with the working diagnosis of sepsis due to pneumonia, complicated with acute diastolic heart failure  ? ?81 yo male with the past medical history of hypertension, abdominal aortic aneurysm, Barrett's esophagus who presented with fever and weakness for few days. Positive fever at home with frequent falls. On his initial physical examination his blood pressure was 128/58, HR 95, RR 16 and oxygen saturation 98%, heart with S1 and S2 present and tachycardic, lungs with scattered wheezing and increased work of breathing, abdomen soft and no lower extremity edema.  ? ?Na 130, K 4,3, CL 99, bicarbonate 17, glucose 134, bun 26 and cr 1,13 ?Lactic acid 2,9  ?Wbc 14,0, Hgb 12.4, Hct 37 plt 159  ?Sars covid 19 negative  ? ?Urine analysis with specific gravity 1,020 with 0-5 wbc  ? ?Chest radiograph with bilateral atelectasis at bases, more at the right lower lobe.  ? ?EKG 118 bpm, left axis deviation, right bundle branch block, sinus rhythm with no significant ST segment or T wave changes.  ? ?Patient was placed on antibiotic therapy to cover pulmonary pathogens.  ? ?He developed volume overload consistent with acute diastolic heart failure, started on diuresis to target negative fluid balance.  ? ?03/07 transition to oral antibiotic therapy. ?Plan to transfer to SNF when medically stable.  ? ?Assessment and Plan: ?* Aspiration pneumonia ([place]) ?Sepsis present on admission.  ? ?Wbc is 10,4 and he has been afebrile.  ?Oxymetry has been 98% on 2 L/min per [place] ? ?Swallow evaluation per speech therapy has recommended aspiration precautions. ? ?Plan to change antibiotic therapy to Augmentin for 4 more days.  ?Ok to change level of care to telemetry.  ? ?Pressure injury of skin ?Positive pressure injury at his lower back,  it is related to a local lipoma. ?Patient has been scheduled for resection as outpatient. ? ?Surgery has been consulted with recommendations for wound care team consult. ?Avoid pressure on wound. ?Follow up as outpatient, no current indication for surgery during his hospitalization.  ? ?Acute on chronic diastolic CHF (congestive heart failure) ([place]) ?Acute diastolic heart failure: Patient with edema at his lower extremities, scrotal edema and wheezing. Positive JVD, consistent with volume overload.  ? ?Plan to add diuresis with furosemide 40 mg IV q12 hrs and follow up echocardiogram.  ? ?Hypertension ?Blood pressure has been stable with systolic 629 to 476 mmHg. ?Continue close monitoring. ? ? ? ?History of pulmonary embolus (PE) ?Anticoagulation with rivaroxaban.  ? ?Gout ?No acute flare, continue with allopurinol.  ? ?Generalized weakness ?Multifactorial, patient very deconditioned.  ?Plan to continue nutritional support and transfer to SNF when medically stable.  ? ?Hyponatremia ?Na continue to be low down to 133, clinically patient with hypervolemia. ?Plan to add furosemide and follow up renal function in am. ?Avoid hypotension and nephrotoxic medications.  ? ? ? ? ?  ? ?Subjective: Patient continue to be very weak and deconditioned, continue to have wheezing, denies dyspnea or chest pain  ? ?Physical Exam: ?Vitals:  ? 08/27/21 1504 08/28/21 0247 08/28/21 5465 08/28/21 1303  ?BP:  (!) 151/74 129/64 126/67  ?Pulse:  97 88 88  ?Resp: (!) 33 20 20 (!) 24  ?Temp:  98 ?F (36.7 ?C) 98 ?F (36.7 ?C) 97.6 ?F (36.4 ?C)  ?TempSrc:  Oral  Oral  ?SpO2: 93% 96% 96% 98%  ?Weight:      ?Height:      ? ?Neurology deconditioned, awake and alert ?ENT with no pallor ?Cardiovascular with S1 and S2 present and rhythmic, with no gallops or murmurs.  ?Positive JVD. ?Positive pitting bilateral lower extremity edema. Positive scrotal edema ?Respiratory with bilateral wheezing, with scattered rales but no wheezing. ?Abdomen protuberant  but soft and non tender  ?Data Reviewed: ? ? ? ?Family Communication: I spoke with patient's wife at the bedside, we talked in detail about patient's condition, plan of care and prognosis and all questions were addressed. ? ? ?Disposition: ?Status is: Inpatient ?Remains inpatient appropriate because: acute heart failure  ? Planned Discharge Destination: Skilled nursing facility ? ? ? ?Author: ?Tawni Millers, MD ?08/28/2021 4:15 PM ? ?For on call review [email or web address].  ?

## 2021-08-28 NOTE — TOC Progression Note (Signed)
Transition of Care (TOC) - Progression Note  ? ? ?Patient Details  ?Name: Torin Modica ?MRN: [id number] ?Date of Birth: 1940/11/16 ? ?Transition of Care (TOC) CM/SW Contact  ?Purcell Mouton, RN ?Phone Number: ?08/28/2021, 3:53 PM ? ?Clinical Narrative:    ?Spoke with pt's wife concerning SNF, bed offers given. Mrs [name] selected [facility] with a private room. [facility] was contacted and will be able to take pt if insurance approves. Insurance co was called to start auth with no answer will continue to call.  ? ? ?  ?  ? ?Expected Discharge Plan and Services ?  ?  ?  ?  ?  ?                ?  ?  ?  ?  ?  ?  ?  ?  ?  ?  ? ? ?Social Determinants of Health (SDOH) Interventions ?  ? ?Readmission Risk Interventions ?No flowsheet data found. ? ?

## 2021-08-28 NOTE — Plan of Care (Signed)
  Problem: Education: Goal: Knowledge of General Education information will improve Description: Including pain rating scale, medication(s)/side effects and non-pharmacologic comfort measures Outcome: Progressing   Problem: Clinical Measurements: Goal: Ability to maintain clinical measurements within normal limits will improve Outcome: Progressing   

## 2021-08-28 NOTE — Progress Notes (Signed)
BSE completed, full report to follow.  Recommend continue regular/thin diet with precautions. Reviewed indication for aspiration and reflux precautions with pt and his wife.   ?Kathleen Lime, MS [facility] SLP ?Acute Rehab Services ?Office [phone] ?Pager [phone] ?  ?

## 2021-08-28 NOTE — Assessment & Plan Note (Addendum)
Acute diastolic heart failure: Patient develops lower extremities edema, scrotal edema and wheezing. Positive JVD, consistent with volume overload.  ?He was started o IV lasix 40 mg IV BID. Good respond to diureis. Lungs CTA, no wheezing. No significant LE> Negative 1.1 L  ?-Transition to oral lasix 3-08. Stable.  ? ? ?

## 2021-08-28 NOTE — Plan of Care (Signed)
?  Problem: Education: ?Goal: Knowledge of General Education information will improve ?Description: Including pain rating scale, medication(s)/side effects and non-pharmacologic comfort measures ?08/28/2021 2356 by Baker Pierini, RN ?Outcome: Progressing ? ?  ?

## 2021-08-29 ENCOUNTER — Inpatient Hospital Stay (HOSPITAL_COMMUNITY): Payer: PPO

## 2021-08-29 DIAGNOSIS — R0609 Other forms of dyspnea: Secondary | ICD-10-CM

## 2021-08-29 DIAGNOSIS — E876 Hypokalemia: Secondary | ICD-10-CM

## 2021-08-29 LAB — BASIC METABOLIC PANEL
Anion gap: 8 (ref 5–15)
BUN: 19 mg/dL (ref 8–23)
CO2: 27 mmol/L (ref 22–32)
Calcium: 7.3 mg/dL — ABNORMAL LOW (ref 8.9–10.3)
Chloride: 98 mmol/L (ref 98–111)
Creatinine, Ser: 0.62 mg/dL (ref 0.61–1.24)
GFR, Estimated: >60 mL/min (ref >60)
Glucose, Bld: 129 mg/dL — ABNORMAL HIGH (ref 70–99)
Potassium: 3.1 mmol/L — ABNORMAL LOW (ref 3.5–5.1)
Sodium: 133 mmol/L — ABNORMAL LOW (ref 135–145)

## 2021-08-29 LAB — ECHOCARDIOGRAM COMPLETE
Height: 72 in
S' Lateral: 2.8 cm
Weight: 3440 oz

## 2021-08-29 LAB — CULTURE, BLOOD (ROUTINE X 2)
Culture: NO GROWTH
Culture: NO GROWTH
Special Requests: ADEQUATE

## 2021-08-29 LAB — VITAMIN B12: Vitamin B-12: 1209 pg/mL — ABNORMAL HIGH (ref 180–914)

## 2021-08-29 LAB — VITAMIN D 25 HYDROXY (VIT D DEFICIENCY, FRACTURES): Vit D, 25-Hydroxy: 15.83 ng/mL — ABNORMAL LOW (ref 30–100)

## 2021-08-29 LAB — PHOSPHORUS: Phosphorus: 2.6 mg/dL (ref 2.5–4.6)

## 2021-08-29 LAB — MAGNESIUM: Magnesium: 2.1 mg/dL (ref 1.7–2.4)

## 2021-08-29 MED ORDER — POTASSIUM CHLORIDE CRYS ER 20 MEQ PO TBCR
40.0000 meq | EXTENDED_RELEASE_TABLET | Freq: Once | ORAL | Status: AC
  Administered 2021-08-29: 40 meq via ORAL
  Filled 2021-08-29: qty 2

## 2021-08-29 MED ORDER — FUROSEMIDE 40 MG PO TABS
40.0000 mg | ORAL_TABLET | Freq: Every day | ORAL | Status: DC
  Administered 2021-08-30: 10:00:00 40 mg via ORAL
  Filled 2021-08-29: qty 1

## 2021-08-29 MED ORDER — IRBESARTAN 75 MG PO TABS
37.5000 mg | ORAL_TABLET | Freq: Every day | ORAL | Status: DC
  Administered 2021-08-29 – 2021-08-30 (×2): 37.5 mg via ORAL
  Filled 2021-08-29 (×2): qty 1

## 2021-08-29 NOTE — Consult Note (Signed)
WOC consult requested for back wounds.   ?Surgical team performed a consult yesterday, refer to their progress notes. "Would not recommend lipoma removal at this time, but he can certainly follow up with a surgeon in our office to discuss this if he would like.  For an elective surgery, he certainly would not be low risk given his multiple medical problems.  I have discussed this case with Dr. Cathlean Sauer and recommended a WOC, RN consult to see if there are any types of dressings, padding, etc to help off-load pressure at this site to help with pain control and wound healing.  No further general surgery needs."  ?WOC consult was already performed on 3/6; refer to previous consult note for assessment, and topical treatment orders have been provided for bedside nurses to perform.  ?Please re-consult if further assistance is needed.  Thank-you,  ?Julien Girt MSN, RN, [place], [place], CNS ?[phone]  ?

## 2021-08-29 NOTE — Progress Notes (Signed)
Echocardiogram ?2D Echocardiogram has been performed. ? ?Arlyss Gandy ?08/29/2021, 2:49 PM ?

## 2021-08-29 NOTE — Progress Notes (Signed)
Notified on call provider about patient having a potassium level of 3.1. Also, made on call provider aware that patient is getting IV Lasix. On call provider put a one time order for oral potassium 40 mEq CR tab. ?

## 2021-08-29 NOTE — Progress Notes (Signed)
Occupational Therapy Treatment ?Patient Details ?Name: [name] ?MRN: [id number] ?DOB: 1940/09/10 ?Today's Date: 08/29/2021 ? ? ?History of present illness H/o chronic lower back, L1 kyphoplasty, Barrett's esophagus, prostate CA s/p radiation ,gout, HTN, AAA, CAD, PE, on xarelto, presented with fever, decrease in appetite ,weakness, increased falls, found to have pneumonia and sepsis. Wife reports fall 2 days before admission and another fall day before admission. ?  ?OT comments ? Treatment focused on educating patient in regards to therapeutic process and the need for movement to regain strength. Patient reports continues weakness and persistently stating "I can't." Patient max assist to transfer to side of bed. He was able to briefly kick his legs at edge of bed. He was propping his body up with his arms and then leaned back on his elbows reporting back pain. Patient max assist to transfer back to bed but needed +2 physical assistance to pull up in bed. Max x 2 to roll in bed when found to be incontinent of stool. Some improvement with physical abilities today but still requires significant assistance for all tasks. Continue to recommend short term rehab at discharge.  ? ?Recommendations for follow up therapy are one component of a multi-disciplinary discharge planning process, led by the attending physician.  Recommendations may be updated based on patient status, additional functional criteria and insurance authorization. ?   ?Follow Up Recommendations ? Skilled nursing-short term rehab (<3 hours/day)  ?  ?Assistance Recommended at Discharge Frequent or constant Supervision/Assistance  ?Patient can return home with the following ? Two people to help with walking and/or transfers;Two people to help with bathing/dressing/bathroom;Direct supervision/assist for medications management;Help with stairs or ramp for entrance;Assist for transportation;Direct supervision/assist for financial management;Assistance with  cooking/housework ?  ?Equipment Recommendations ?    ?  ?Recommendations for Other Services   ? ?  ?Precautions / Restrictions Precautions ?Precautions: Fall ?Precaution Comments: large lipoma on back, back sores that cause pain ?Restrictions ?Weight Bearing Restrictions: No  ? ? ?  ? ?Mobility Bed Mobility ?Overal bed mobility: Needs Assistance ?Bed Mobility: Supine to Sit, Sit to Supine, Rolling ?Rolling: Max assist, +2 for physical assistance (to roll left and right) ?  ?Supine to sit: Max assist ?Sit to supine: Max assist ?  ?General bed mobility comments: Max assist to transfer to side of bed - needing assistance for LEs and assist with pivoting hips via bed pad. Patient assisting with arms on bed rails. Also max assist to return to supine. ?  ? ?Transfers ?  ?  ?  ?  ?  ?  ?  ?  ?  ?  ?  ?  ?Balance Overall balance assessment: Needs assistance ?Sitting-balance support: Bilateral upper extremity supported ?Sitting balance-Leahy Scale: Poor ?Sitting balance - Comments: propping with [place] ?  ?  ?  ?  ?  ?  ?  ?  ?  ?  ?  ?  ?  ?  ?  ?   ? ?ADL either performed or assessed with clinical judgement  ? ?ADL   ?  ?  ?  ?  ?  ?  ?  ?  ?  ?  ?  ?  ?  ?  ?  ?  ?  ?  ?  ?  ?  ? ?Extremity/Trunk Assessment Upper Extremity Assessment ?Upper Extremity Assessment: Overall WFL for tasks assessed ?  ?  ?  ?  ?  ? ?Vision   ?Vision Assessment?: No apparent visual deficits ?  ?  Perception   ?  ?Praxis   ?  ? ?Cognition Arousal/Alertness: Awake/alert ?Behavior During Therapy: Flat affect ?Overall Cognitive Status: Within Functional Limits for tasks assessed ?  ?  ?  ?  ?  ?  ?  ?  ?  ?  ?  ?  ?  ?  ?  ?  ?  ?  ?  ?   ?Exercises   ? ?  ?Shoulder Instructions   ? ? ?  ?General Comments    ? ? ?Pertinent Vitals/ Pain       Pain Assessment ?Pain Assessment: Faces ?Faces Pain Scale: Hurts little more ?Pain Location: back pain ?Pain Descriptors / Indicators: Discomfort ?Pain Intervention(s): Limited activity within patient's  tolerance ? ?Home Living   ?  ?  ?  ?  ?  ?  ?  ?  ?  ?  ?  ?  ?  ?  ?  ?  ?  ?  ? ?  ?Prior Functioning/Environment    ?  ?  ?  ?   ? ?Frequency ? Min 2X/week  ? ? ? ? ?  ?Progress Toward Goals ? ?OT Goals(current goals can now be found in the care plan section) ? Progress towards OT goals: Progressing toward goals ? ?Acute Rehab OT Goals ?OT Goal Formulation: With patient ?Time For Goal Achievement: 09/09/21 ?Potential to Achieve Goals: Fair  ?Plan Discharge plan remains appropriate   ? ?Co-evaluation ? ? ?   ?  ?  ?  ?  ? ?  ?AM-PAC OT "6 Clicks" Daily Activity     ?Outcome Measure ? ? Help from another person eating meals?: A Lot ?Help from another person taking care of personal grooming?: A Lot ?Help from another person toileting, which includes using toliet, bedpan, or urinal?: Total ?Help from another person bathing (including washing, rinsing, drying)?: Total ?Help from another person to put on and taking off regular upper body clothing?: A Lot ?Help from another person to put on and taking off regular lower body clothing?: Total ?6 Click Score: 9 ? ?  ?End of Session   ? ?OT Visit Diagnosis: Pain;Muscle weakness (generalized) (M62.81);Other abnormalities of gait and mobility (R26.89) ?  ?Activity Tolerance Patient limited by fatigue;Patient limited by pain ?  ?Patient Left in bed;with call bell/phone within reach;with bed alarm set;with nursing/sitter in room ?  ?Nurse Communication Mobility status ?  ? ?   ? ?Time: 5361-4431 ?OT Time Calculation (min): 21 min ? ?Charges: OT General Charges ?$OT Visit: 1 Visit ?OT Treatments ?$Therapeutic Activity: 8-22 mins ? ?[name], OTR/L ?Acute Care Rehab Services  ?Office [phone] ?Pager: [phone]  ? ?[name] ?08/29/2021, 9:43 AM ?

## 2021-08-29 NOTE — Assessment & Plan Note (Signed)
Replete orally.  ?Mg normal.  ?

## 2021-08-29 NOTE — TOC Progression Note (Signed)
Transition of Care (TOC) - Progression Note  ? ? ?Patient Details  ?Name: [name] ?MRN: [id number] ?Date of Birth: [birth year] ? ?Transition of Care (TOC) CM/SW Contact  ?Purcell Mouton, RN ?Phone Number: ?08/29/2021, 2:27 PM ? ?Clinical Narrative:    ? ?Pt may discharge in the AM to [facility]. Insurance auth # or SNF [id number]. Insurance auth # For [facility] [id number]. ? ?  ?  ? ?Expected Discharge Plan and Services ?  ?  ?  ?  ?  ?                ?  ?  ?  ?  ?  ?  ?  ?  ?  ?  ? ? ?Social Determinants of Health (SDOH) Interventions ?  ? ?Readmission Risk Interventions ?No flowsheet data found. ? ?

## 2021-08-29 NOTE — TOC Progression Note (Signed)
Transition of Care (TOC) - Progression Note  ? ? ?Patient Details  ?Name: [name] ?MRN: [id number] ?Date of Birth: 15-May-1941 ? ?Transition of Care (TOC) CM/SW Contact  ?Purcell Mouton, RN ?Phone Number: ?08/29/2021, 1:29 PM ? ?Clinical Narrative:    ? ?Wife and husband changed to [facility] for SNF. A call was made to [facility] for a bed. Waiting for insurance auth. ? ?  ?  ? ?Expected Discharge Plan and Services ?  ?  ?  ?  ?  ?                ?  ?  ?  ?  ?  ?  ?  ?  ?  ?  ? ? ?Social Determinants of Health (SDOH) Interventions ?  ? ?Readmission Risk Interventions ?No flowsheet data found. ? ?

## 2021-08-29 NOTE — Progress Notes (Signed)
?Progress Note ? ? ?Patient: [name] FWY:[id number] DOB: 08/08/1940 DOA: 08/24/2021     5 ?DOS: the patient was seen and examined on 08/29/2021 ?  ?Brief hospital course: ?[name] was admitted to the hospital with the working diagnosis of sepsis due to pneumonia, complicated with acute diastolic heart failure  ? ?81 yo male with the past medical history of hypertension, abdominal aortic aneurysm, Barrett's esophagus who presented with fever and weakness for few days. Positive fever at home with frequent falls. On his initial physical examination his blood pressure was 128/58, HR 95, RR 16 and oxygen saturation 98%, heart with S1 and S2 present and tachycardic, lungs with scattered wheezing and increased work of breathing, abdomen soft and no lower extremity edema.  ? ?Chest radiograph with bilateral atelectasis at bases, more at the right lower lobe.  ? ?He developed volume overload consistent with acute diastolic heart failure, started on diuresis to target negative fluid balance. He had good urine out put yesterday 2.8 L urine. He will be transition to oral lasix. He denies worsening dyspnea.  ?03/07 transition to oral antibiotic therapy. ?He is medical stable to be transfer to SNF>  ? ?Assessment and Plan: ?* Aspiration pneumonia ([place]) ?Sepsis present on admission.  ?Presents with tachypnea, tachycardia, leukocytosis, lactic acidosis. chest x ray with bibasilar airspace diseases. Admitted with PNA>  ?-Evaluated by speech, who recommend aspiration precaution  ?-Plan to complete 4 more days of Augmentin.  ? ? ?Pressure injury of skin ?Positive pressure injury at his lower back, it is related to a local lipoma. ?Patient has been scheduled for resection as outpatient. ?Surgery has been consulted with recommendations for wound care team consult. ?Avoid pressure on wound. ?Follow up as outpatient, no current indication for surgery during his hospitalization.  ? ?Acute on chronic diastolic CHF (congestive heart  failure) ([place]) ?Acute diastolic heart failure: Patient develops lower extremities edema, scrotal edema and wheezing. Positive JVD, consistent with volume overload.  ?He was started o IV lasix 40 mg IV BID. Good respond to diureis. Lungs CTA, no wheezing. No significant LE>  ?-2.8 L urine out put since yesterday.  ?-Transition to oral lasix today.  ? ? ? ?Hypertension ?Now on lasix. Resume ARB ? ? ? ?History of pulmonary embolus (PE) ?Continue  with rivaroxaban.  ? ?Gout ?Continue with allopurinol.  ? ?Generalized weakness ?Multifactorial, patient very deconditioned.  ?Plan to continue nutritional support and transfer to SNF . ?B 12, mg, and Phosphorus normal range. Vitamin D pending.  ? ?Hyponatremia ?Monitor on lasix.  ? ?Hypokalemia ?Replete orally.  ?Mg normal.  ? ? ? ? ?  ? ?Subjective: he is alert, denies worsening dyspnea.  ?Lower extremity edema improved. Scrotal edema persist , not worse, denies scrotal pain.  ? ?Physical Exam: ?Vitals:  ? 08/28/21 1303 08/28/21 2035 08/29/21 0536 08/29/21 0900  ?BP: 126/67 (!) 154/79 (!) 146/87   ?Pulse: 88 (!) 105 91   ?Resp: (!) '24 20 20 '$ (!) 21  ?Temp: 97.6 ?F (36.4 ?C) 99.2 ?F (37.3 ?C) 97.9 ?F (36.6 ?C)   ?TempSrc: Oral Oral Oral   ?SpO2: 98% 95% 93%   ?Weight:      ?Height:      ? ?General; NAD ?Lung; CTA ?CVS; S 1, S 2 RRR ?Extremity, no edema ? ?Data Reviewed: ? ?Bmet, Mg, Phosphorus, B12 reviewed.  ? ?Family Communication: Care discussed with wife who was at bedside.  ? ?Disposition: ?Status is: Inpatient ?Remains inpatient appropriate because: treatment for PNA and heart failure.  ?  Planned Discharge Destination: Skilled nursing facility ? ? ? ?Time spent: 45 minutes ? ?Author: ?Elmarie Shiley, MD ?08/29/2021 12:29 PM ? ?For on call review [email or web address].  ?

## 2021-08-29 NOTE — Progress Notes (Signed)
PT Cancellation Note ? ?Patient Details ?Name: [name] ?MRN: [id number] ?DOB: 07/12/1940 ? ? ?Cancelled Treatment:    Reason Eval/Treat Not Completed: Patient declined, no reason specified. Pt reported he does not want PT today. He stated he did not get any sleep last night and stated I was the 20th person to bother him today. Pt did participate in OT this morning, but unfortunately has declined PT. Pt will continue to benefit from acute PT and recommend d/c to SNF for rehabilitation.  ? ? ?Lelon Mast ?08/29/2021, 11:28 AM ?

## 2021-08-30 DIAGNOSIS — R2681 Unsteadiness on feet: Secondary | ICD-10-CM | POA: Diagnosis not present

## 2021-08-30 DIAGNOSIS — K219 Gastro-esophageal reflux disease without esophagitis: Secondary | ICD-10-CM | POA: Diagnosis not present

## 2021-08-30 DIAGNOSIS — B372 Candidiasis of skin and nail: Secondary | ICD-10-CM | POA: Diagnosis not present

## 2021-08-30 DIAGNOSIS — I5033 Acute on chronic diastolic (congestive) heart failure: Secondary | ICD-10-CM | POA: Diagnosis not present

## 2021-08-30 DIAGNOSIS — I5032 Chronic diastolic (congestive) heart failure: Secondary | ICD-10-CM | POA: Diagnosis not present

## 2021-08-30 DIAGNOSIS — Z743 Need for continuous supervision: Secondary | ICD-10-CM | POA: Diagnosis not present

## 2021-08-30 DIAGNOSIS — I2699 Other pulmonary embolism without acute cor pulmonale: Secondary | ICD-10-CM | POA: Diagnosis not present

## 2021-08-30 DIAGNOSIS — I2510 Atherosclerotic heart disease of native coronary artery without angina pectoris: Secondary | ICD-10-CM | POA: Diagnosis not present

## 2021-08-30 DIAGNOSIS — M6259 Muscle wasting and atrophy, not elsewhere classified, multiple sites: Secondary | ICD-10-CM | POA: Diagnosis not present

## 2021-08-30 DIAGNOSIS — L89141 Pressure ulcer of left lower back, stage 1: Secondary | ICD-10-CM | POA: Diagnosis not present

## 2021-08-30 DIAGNOSIS — J690 Pneumonitis due to inhalation of food and vomit: Secondary | ICD-10-CM | POA: Diagnosis not present

## 2021-08-30 DIAGNOSIS — I10 Essential (primary) hypertension: Secondary | ICD-10-CM | POA: Diagnosis not present

## 2021-08-30 DIAGNOSIS — J189 Pneumonia, unspecified organism: Secondary | ICD-10-CM | POA: Diagnosis not present

## 2021-08-30 DIAGNOSIS — D171 Benign lipomatous neoplasm of skin and subcutaneous tissue of trunk: Secondary | ICD-10-CM | POA: Diagnosis not present

## 2021-08-30 DIAGNOSIS — R1312 Dysphagia, oropharyngeal phase: Secondary | ICD-10-CM | POA: Diagnosis not present

## 2021-08-30 DIAGNOSIS — M109 Gout, unspecified: Secondary | ICD-10-CM | POA: Diagnosis not present

## 2021-08-30 DIAGNOSIS — R1084 Generalized abdominal pain: Secondary | ICD-10-CM | POA: Diagnosis not present

## 2021-08-30 DIAGNOSIS — R531 Weakness: Secondary | ICD-10-CM | POA: Diagnosis not present

## 2021-08-30 DIAGNOSIS — E559 Vitamin D deficiency, unspecified: Secondary | ICD-10-CM

## 2021-08-30 DIAGNOSIS — Z86711 Personal history of pulmonary embolism: Secondary | ICD-10-CM | POA: Diagnosis not present

## 2021-08-30 DIAGNOSIS — L8942 Pressure ulcer of contiguous site of back, buttock and hip, stage 2: Secondary | ICD-10-CM | POA: Diagnosis not present

## 2021-08-30 DIAGNOSIS — N492 Inflammatory disorders of scrotum: Secondary | ICD-10-CM | POA: Diagnosis not present

## 2021-08-30 DIAGNOSIS — M6281 Muscle weakness (generalized): Secondary | ICD-10-CM | POA: Diagnosis not present

## 2021-08-30 DIAGNOSIS — N5082 Scrotal pain: Secondary | ICD-10-CM | POA: Diagnosis not present

## 2021-08-30 MED ORDER — LEVALBUTEROL HCL 0.63 MG/3ML IN NEBU: 0.6300 mg | INHALATION_SOLUTION | Freq: Three times a day (TID) | RESPIRATORY_TRACT | 12 refills | Status: DC | PRN

## 2021-08-30 MED ORDER — THIAMINE HCL 100 MG PO TABS: 100.0000 mg | ORAL_TABLET | Freq: Every day | ORAL | 0 refills | Status: DC

## 2021-08-30 MED ORDER — FUROSEMIDE 40 MG PO TABS: 40.0000 mg | ORAL_TABLET | Freq: Every day | ORAL | 0 refills | Status: DC

## 2021-08-30 MED ORDER — VITAMIN D (ERGOCALCIFEROL) 1.25 MG (50000 UNIT) PO CAPS
50000.0000 [IU] | ORAL_CAPSULE | ORAL | Status: DC
  Administered 2021-08-30: 10:00:00 50000 [IU] via ORAL
  Filled 2021-08-30: qty 1

## 2021-08-30 MED ORDER — AMOXICILLIN-POT CLAVULANATE 875-125 MG PO TABS
1.0000 | ORAL_TABLET | Freq: Two times a day (BID) | ORAL | 0 refills | Status: AC
Start: 2021-08-30 — End: 2021-09-02

## 2021-08-30 MED ORDER — VITAMIN D (ERGOCALCIFEROL) 1.25 MG (50000 UNIT) PO CAPS
50000.0000 [IU] | ORAL_CAPSULE | ORAL | 0 refills | Status: DC
Start: 2021-08-30 — End: 2021-10-06

## 2021-08-30 MED ORDER — ADULT MULTIVITAMIN W/MINERALS CH: 1.0000 | ORAL_TABLET | Freq: Every day | ORAL | 0 refills | Status: DC

## 2021-08-30 NOTE — TOC Progression Note (Signed)
Transition of Care (TOC) - Progression Note  ? ? ?Patient Details  ?Name: [name] ?MRN: [id number] ?Date of Birth: Nov 08, 1940 ? ?Transition of Care (TOC) CM/SW Contact  ?Purcell Mouton, RN ?Phone Number: ?08/30/2021, 12:14 PM ? ?Clinical Narrative:    ? ?[facility] was called. RN aware.  ? ?  ?  ? ?Expected Discharge Plan and Services ?  ?  ?  ?  ?  ?Expected Discharge Date: 08/30/21               ?  ?  ?  ?  ?  ?  ?  ?  ?  ?  ? ? ?Social Determinants of Health (SDOH) Interventions ?  ? ?Readmission Risk Interventions ?No flowsheet data found. ? ?

## 2021-08-30 NOTE — Assessment & Plan Note (Signed)
Replete with 50,000 units weekly.  ?

## 2021-08-30 NOTE — TOC Progression Note (Signed)
Transition of Care (TOC) - Progression Note  ? ? ?Patient Details  ?Name: [name] ?MRN: [id number] ?Date of Birth: 1941-05-07 ? ?Transition of Care (TOC) CM/SW Contact  ?Purcell Mouton, RN ?Phone Number: ?08/30/2021, 10:24 AM ? ?Clinical Narrative:    ?Pt and his wife changed SNF to [facility]. Pt will transfer today.  ? ? ?  ?  ? ?Expected Discharge Plan and Services ?  ?  ?  ?  ?  ?Expected Discharge Date: 08/30/21               ?  ?  ?  ?  ?  ?  ?  ?  ?  ?  ? ? ?Social Determinants of Health (SDOH) Interventions ?  ? ?Readmission Risk Interventions ?No flowsheet data found. ? ?

## 2021-08-30 NOTE — Plan of Care (Signed)
  Problem: Education: Goal: Knowledge of General Education information will improve Description Including pain rating scale, medication(s)/side effects and non-pharmacologic comfort measures Outcome: Progressing   

## 2021-08-30 NOTE — Progress Notes (Signed)
Physical Therapy Treatment ?Patient Details ?Name: [name] ?MRN: [id number] ?DOB: 12-04-1940 ?Today's Date: 08/30/2021 ? ? ?History of Present Illness H/o chronic lower back, L1 kyphoplasty, Barrett's esophagus, prostate CA s/p radiation ,gout, HTN, AAA, CAD, PE, on xarelto, presented with fever, decrease in appetite ,weakness, increased falls, found to have pneumonia and sepsis. Wife reports fall 2 days before admission and another fall day before admission. ? ?  ?PT Comments  ? ? Patient progressing today to OOB activity. He required Mod-Max +2 assist for bed mobility and transfer from bed to chair with Denna Haggard. Pt was able to stand 3x from stedy paddles with mod+2 assist and required Max assist to control lowering to recliner. Pt fatigued after ~20 minutes sitting/standing EOB or in [place]. Discussed with pt benefits of SNF for ST rehab and benefit of increased OOB activity. Will continue to progress pt as able throughout stay. ? ?  ?Recommendations for follow up therapy are one component of a multi-disciplinary discharge planning process, led by the attending physician.  Recommendations may be updated based on patient status, additional functional criteria and insurance authorization. ? ?Follow Up Recommendations ? Skilled nursing-short term rehab (<3 hours/day) ?  ?  ?Assistance Recommended at Discharge Frequent or constant Supervision/Assistance  ?Patient can return home with the following Two people to help with walking and/or transfers;Two people to help with bathing/dressing/bathroom;Assist for transportation;Assistance with cooking/housework;Help with stairs or ramp for entrance ?  ?Equipment Recommendations ? None recommended by PT  ?  ?Recommendations for Other Services OT consult ? ? ?  ?Precautions / Restrictions Precautions ?Precautions: Fall ?Precaution Comments: large lipoma on back, back sores that cause pain ?Restrictions ?Weight Bearing Restrictions: No  ?  ? ?Mobility ? Bed Mobility ?Overal  bed mobility: Needs Assistance ?Bed Mobility: Supine to Sit, Sit to Supine, Rolling ?  ?  ?Supine to sit: Max assist, +2 for physical assistance, +2 for safety/equipment, HOB elevated ?  ?  ?  ?  ? ?Transfers ?Overall transfer level: Needs assistance ?Equipment used:  Charlaine Dalton) ?Transfers: Sit to/from Stand, Bed to chair/wheelchair/BSC ?Sit to Stand: From elevated surface, Mod assist, Max assist, +2 physical assistance, +2 safety/equipment (Stedy) ?  ?Step pivot transfers: Total assist, +2 safety/equipment, From elevated surface ?  ?  ?  ?General transfer comment: Mod-Max from significantly elevated surface of EOB to [place]. Pt completed 1 stand at EOB, 3x form Stedy paddles. Able to stand for ~1 minute at a time for pericare due to BM. Required cues for hand placement on Stedy crossbar and Mod-Max assist at hips to facilitate rise. Max +2 to control lowering to recliner. ?Transfer via Lift Equipment: Stedy ? ?Ambulation/Gait ?  ?  ?  ?  ?  ?  ?  ?  ? ? ?Stairs ?  ?  ?  ?  ?  ? ? ?Wheelchair Mobility ?  ? ?Modified Rankin (Stroke Patients Only) ?  ? ? ?  ?Balance Overall balance assessment: Needs assistance ?Sitting-balance support: Bilateral upper extremity supported ?Sitting balance-Leahy Scale: Poor ?Sitting balance - Comments: propping with Ues ?Postural control: Posterior lean ?Standing balance support: Reliant on assistive device for balance ?Standing balance-Leahy Scale: Poor ?Standing balance comment: Stedy for support ?  ?  ?  ?  ?  ?  ?  ?  ?  ?  ?  ?  ? ?  ?Cognition Arousal/Alertness: Awake/alert ?Behavior During Therapy: Flat affect ?Overall Cognitive Status: Within Functional Limits for tasks assessed ?  ?  ?  ?  ?  ?  ?  ?  ?  ?  ?  ?  ?  ?  ?  ?  ?  General Comments: pt fixating on need to be able to transfer to bathroom at rehab. pt wanting to be able to get out of bed before discharging. ?  ?  ? ?  ?Exercises   ? ?  ?General Comments   ?  ?  ? ?Pertinent Vitals/Pain Pain Assessment ?Pain Assessment:  Faces ?Faces Pain Scale: Hurts little more ?Pain Location: back pain ?Pain Descriptors / Indicators: Discomfort ?Pain Intervention(s): Limited activity within patient's tolerance, Monitored during session, Repositioned  ? ? ?Home Living   ?  ?  ?  ?  ?  ?  ?  ?  ?  ?   ?  ?Prior Function    ?  ?  ?   ? ?PT Goals (current goals can now be found in the care plan section) Acute Rehab PT Goals ?Patient Stated Goal: rehab ?PT Goal Formulation: With family ?Time For Goal Achievement: 09/08/21 ?Potential to Achieve Goals: Good ?Progress towards PT goals: Progressing toward goals ? ?  ?Frequency ? ? ? Min 2X/week ? ? ? ?  ?PT Plan Current plan remains appropriate  ? ? ?Co-evaluation   ?  ?  ?  ?  ? ?  ?AM-PAC PT "6 Clicks" Mobility   ?Outcome Measure ? Help needed turning from your back to your side while in a flat bed without using bedrails?: A Lot ?Help needed moving from lying on your back to sitting on the side of a flat bed without using bedrails?: A Lot ?Help needed moving to and from a bed to a chair (including a wheelchair)?: Total ?Help needed standing up from a chair using your arms (e.g., wheelchair or bedside chair)?: Total ?Help needed to walk in hospital room?: Total ?Help needed climbing 3-5 steps with a railing? : Total ?6 Click Score: 8 ? ?  ?End of Session Equipment Utilized During Treatment: Gait belt ?Activity Tolerance: Patient tolerated treatment well ?Patient left: in chair;with call bell/phone within reach;with chair alarm set;with family/visitor present ?Nurse Communication: Mobility status;Need for lift equipment ?PT Visit Diagnosis: History of falling (Z91.81);Muscle weakness (generalized) (M62.81);Difficulty in walking, not elsewhere classified (R26.2);Unsteadiness on feet (R26.81) ?  ? ? ?Time: 7510-2585 ?PT Time Calculation (min) (ACUTE ONLY): 31 min ? ?Charges:  $Therapeutic Activity: 23-37 mins          ?          ? ?Gwynneth Albright PT, DPT ?Acute Rehabilitation Services ?Office [phone] ?Pager  [phone]  ? ? ?[name] ?08/30/2021, 3:35 PM ? ?

## 2021-08-30 NOTE — Progress Notes (Signed)
Report called to Gomez Cleverly, LPN at [facility] for pt. Going to room 116.  All questions answered and awaiting PTAR for transport ?

## 2021-08-30 NOTE — Discharge Summary (Signed)
Physician Discharge Summary   Patient: [name] MRN: [id number] DOB: 05-26-1941  Admit date:     08/24/2021  Discharge date: 08/30/21  Discharge Physician: Elmarie Shiley   PCP: Deland Pretty, MD   Recommendations at discharge:   Needs labs to follow electrolytes, now he is on lasix.  Discharge Diagnoses: Principal Problem:   Aspiration pneumonia ([place]) Active Problems:   Pressure injury of skin   Hypertension   Acute on chronic diastolic CHF (congestive heart failure) (HCC)   History of pulmonary embolus (PE)   Gout   Generalized weakness   Hyponatremia   Hypokalemia   Vitamin D deficiency  Resolved Problems:   * No resolved hospital problems. [facility] Course: [name] was admitted to the hospital with the working diagnosis of sepsis due to pneumonia, complicated with acute diastolic heart failure   81 yo male with the past medical history of hypertension, abdominal aortic aneurysm, Barrett's esophagus who presented with fever and weakness for few days. Positive fever at home with frequent falls. On his initial physical examination his blood pressure was 128/58, HR 95, RR 16 and oxygen saturation 98%, heart with S1 and S2 present and tachycardic, lungs with scattered wheezing and increased work of breathing, abdomen soft and no lower extremity edema.   Chest radiograph with bilateral atelectasis at bases, more at the right lower lobe.   He developed volume overload consistent with acute diastolic heart failure, started on diuresis to target negative fluid balance. He had good urine out put yesterday 2.8 L urine. He will be transition to oral lasix. He denies worsening dyspnea.  03/07 transition to oral antibiotic therapy. He is medical stable to be transfer to SNF>   Assessment and Plan: * Aspiration pneumonia ([place]) Sepsis present on admission.  Presents with tachypnea, tachycardia, leukocytosis, lactic acidosis. chest x ray with bibasilar airspace diseases.  Admitted with PNA>  -Evaluated by speech, who recommend aspiration precaution  -Plan to complete 3 more days of Augmentin.    Pressure injury of skin Positive pressure injury at his lower back, it is related to a local lipoma. Patient has been scheduled for resection as outpatient. Surgery has been consulted with recommendations for wound care team consult. Avoid pressure on wound. Follow up as outpatient, no current indication for surgery during his hospitalization.   Acute on chronic diastolic CHF (congestive heart failure) (HCC) Acute diastolic heart failure: Patient develops lower extremities edema, scrotal edema and wheezing. Positive JVD, consistent with volume overload.  He was started o IV lasix 40 mg IV BID. Good respond to diureis. Lungs CTA, no wheezing. No significant LE> Negative 1.1 L  -Transition to oral lasix 3-08. Stable.     Hypertension Now on lasix. Resume ARB    History of pulmonary embolus (PE) Continue  with rivaroxaban.   Gout Continue with allopurinol.   Generalized weakness Multifactorial, patient very deconditioned.  Plan to continue nutritional support and transfer to SNF . B 12, mg, and Phosphorus normal range. Vitamin D low, started supplement.   Hyponatremia Monitor on lasix.   Vitamin D deficiency Replete with 50,000 units weekly.   Hypokalemia Replete orally.  Mg normal.          Consultants: None Procedures performed: None Disposition: Skilled nursing facility Diet recommendation:  Discharge Diet Orders (From admission, onward)     Start     Ordered   08/30/21 0000  Diet - low sodium heart healthy        08/30/21 0956  Cardiac diet DISCHARGE MEDICATION: Allergies as of 08/30/2021   No Known Allergies      Medication List     TAKE these medications    allopurinol 300 MG tablet Commonly known as: ZYLOPRIM Take 300 mg by mouth every morning.   amoxicillin-clavulanate 875-125 MG tablet Commonly  known as: AUGMENTIN Take 1 tablet by mouth every 12 (twelve) hours for 3 days.   ferrous sulfate 325 (65 FE) MG tablet Take 1 tablet (325 mg total) by mouth daily for 30 days. What changed: when to take this   furosemide 40 MG tablet Commonly known as: LASIX Take 1 tablet (40 mg total) by mouth daily.   gentamicin cream 0.1 % Commonly known as: GARAMYCIN Apply 1 application topically See admin instructions. Apply to sacral-area pressure sores 2 times a day   levalbuterol 0.63 MG/3ML nebulizer solution Commonly known as: XOPENEX Take 3 mLs (0.63 mg total) by nebulization every 8 (eight) hours as needed for wheezing or shortness of breath.   multivitamin with minerals Tabs tablet Take 1 tablet by mouth daily.   olmesartan 20 MG tablet Commonly known as: BENICAR TAKE 1 TABLET(20 MG) BY MOUTH DAILY What changed: See the new instructions.   omeprazole 20 MG capsule Commonly known as: PRILOSEC Take 1 capsule (20 mg total) by mouth daily. Please keep your March Appointment for further refills. Thank you What changed:  when to take this additional instructions   thiamine 100 MG tablet Take 1 tablet (100 mg total) by mouth daily.   Vitamin D (Ergocalciferol) 1.25 MG (50000 UNIT) Caps capsule Commonly known as: DRISDOL Take 1 capsule (50,000 Units total) by mouth every 7 (seven) days.   Xarelto 20 MG Tabs tablet Generic drug: rivaroxaban TAKE 1 TABLET(20 MG) BY MOUTH DAILY WITH SUPPER What changed: See the new instructions.               Discharge Care Instructions  (From admission, onward)           Start     Ordered   08/30/21 0000  Discharge wound care:       Comments: See above   08/30/21 0956            Follow-up Information     [facility] [place] Follow up today.   Specialty: General Surgery Why: as needed for lipoma removal Contact information: [place] [address] [phone]                 Discharge Exam: [name] Weights   08/24/21 0814  Weight: 97.5 kg   General; NAD Lung; CTA Abdomen; soft, nt  Condition at discharge: stable  The results of significant diagnostics from this hospitalization (including imaging, microbiology, ancillary and laboratory) are listed below for reference.   Imaging Studies: DG Chest Port 1 View  Result Date: 08/24/2021 CLINICAL DATA:  Questionable sepsis. Increased weakness. Decreased mobility. Low-grade fever. EXAM: PORTABLE CHEST 1 VIEW COMPARISON:  Two-view chest x-ray 07/09/2018 FINDINGS: Heart size is scratched at the heart is enlarged. Atherosclerotic calcifications are present at the aortic arch. Lung volumes are low bilaterally. Bibasilar airspace opacities are present. Degenerative changes are noted at the [facility] joints bilaterally. IMPRESSION: Low lung volumes with bibasilar airspace disease. While this likely reflects atelectasis, infection is not excluded. Electronically Signed   By: San Morelle M.D.   On: 08/24/2021 09:17   ECHOCARDIOGRAM COMPLETE  Result Date: 08/29/2021    ECHOCARDIOGRAM REPORT   Patient Name:  [name] Date of Exam: 08/29/2021 Medical Rec #:  [id number]      Height:       72.0 in Accession #:    [id number]     Weight:       215.0 lb Date of Birth:  1941/06/12       BSA:          2.197 m Patient Age:    10 years       BP:           146/87 mmHg Patient Gender: M              HR:           91 bpm. Exam Location:  Inpatient Procedure: 2D Echo Indications:    Dyspnea  History:        Patient has prior history of Echocardiogram examinations, most                 recent 10/24/2017. CAD, Signs/Symptoms:Shortness of Breath; Risk                 Factors:Hypertension.  Sonographer:    Arlyss Gandy Referring Phys: [id number] [place]  Sonographer Comments: No apical window and no subcostal window. Image acquisition challenging due to patient body habitus. IMPRESSIONS  1. Left ventricular ejection fraction, by  estimation, is 55 to 60%. The left ventricle has normal function. The left ventricle has no regional wall motion abnormalities. There is mild concentric left ventricular hypertrophy. Left ventricular diastolic function could not be evaluated.  2. Right ventricular systolic function was not well visualized. The right ventricular size is moderately enlarged. Tricuspid regurgitation signal is inadequate for assessing PA pressure.  3. The mitral valve is grossly normal. Trivial mitral valve regurgitation. No evidence of mitral stenosis.  4. Tricuspid valve regurgitation is mild to moderate.  5. The aortic valve is tricuspid. There is mild calcification of the aortic valve. There is mild thickening of the aortic valve. Aortic valve regurgitation is trivial. Aortic valve sclerosis/calcification is present, without any evidence of aortic stenosis. Comparison(s): Prior images reviewed side by side. Conclusion(s)/Recommendation(s): Otherwise normal echocardiogram, with minor abnormalities described in the report. Very limited windows. LV appears grossly normal. RV not well seen but appears enlarged relative to LV size. No severe valve disease noted by images or Doppler. FINDINGS  Left Ventricle: Left ventricular ejection fraction, by estimation, is 55 to 60%. The left ventricle has normal function. The left ventricle has no regional wall motion abnormalities. The left ventricular internal cavity size was normal in size. There is  mild concentric left ventricular hypertrophy. Left ventricular diastolic function could not be evaluated. Right Ventricle: The right ventricular size is moderately enlarged. Right vetricular wall thickness was not well visualized. Right ventricular systolic function was not well visualized. Tricuspid regurgitation signal is inadequate for assessing PA pressure. Left Atrium: Left atrial size was not well visualized. Right Atrium: Right atrial size was not well visualized. Pericardium: There is no  evidence of pericardial effusion. Mitral Valve: The mitral valve is grossly normal. Trivial mitral valve regurgitation. No evidence of mitral valve stenosis. Tricuspid Valve: The tricuspid valve is grossly normal. Tricuspid valve regurgitation is mild to moderate. No evidence of tricuspid stenosis. Aortic Valve: The aortic valve is tricuspid. There is mild calcification of the aortic valve. There is mild thickening of the aortic valve. Aortic valve regurgitation is trivial. Aortic valve sclerosis/calcification is present, without any evidence of aortic stenosis. Pulmonic Valve: The pulmonic valve  was grossly normal. Pulmonic valve regurgitation is trivial. No evidence of pulmonic stenosis. Aorta: The aortic root and ascending aorta are structurally normal, with no evidence of dilitation. Venous: The inferior vena cava was not well visualized. IAS/Shunts: The interatrial septum was not assessed.  LEFT VENTRICLE PLAX 2D LVIDd:         4.10 cm LVIDs:         2.80 cm LV PW:         1.20 cm LV IVS:        1.20 cm LVOT diam:     2.40 cm LVOT Area:     4.52 cm  LEFT ATRIUM         Index LA diam:    3.60 cm 1.64 cm/m   AORTA Ao Root diam: 3.70 cm Ao Asc diam:  3.60 cm TRICUSPID VALVE TR Peak grad:   29.8 mmHg TR Vmax:        273.00 cm/s  SHUNTS Systemic Diam: 2.40 cm Buford Dresser MD Electronically signed by Buford Dresser MD Signature Date/Time: 08/29/2021/3:02:49 PM    Final     Microbiology: Results for orders placed or performed during the hospital encounter of 08/24/21  Blood Culture (routine x 2)     Status: None   Collection Time: 08/24/21  8:00 AM   Specimen: BLOOD  Result Value Ref Range Status   Specimen Description   Final    BLOOD BLOOD LEFT HAND Performed at [facility], [place] [address] [place], [place] [address]    Special Requests   Final    BOTTLES DRAWN AEROBIC AND ANAEROBIC Blood Culture results may not be optimal due to an inadequate volume of blood received  in culture bottles Performed at [place] [address], [place], [place] [address]    Culture   Final    NO GROWTH 5 DAYS Performed at [facility], [place] [address] [place], [place] [address]    Report Status 08/29/2021 FINAL  Final  Urine Culture     Status: Abnormal   Collection Time: 08/24/21  8:16 AM   Specimen: In/Out Cath Urine  Result Value Ref Range Status   Specimen Description   Final    IN/OUT CATH URINE Performed at [facility] [address] [place], [place] [address]    Special Requests   Final    NONE Performed at [facility], [place] [address] [place], [place] [address]    Culture (A)  Final    <10,000 COLONIES/mL INSIGNIFICANT GROWTH Performed at [facility] [address] [place], [place] [address]    Report Status 08/26/2021 FINAL  Final  Blood Culture (routine x 2)     Status: None   Collection Time: 08/24/21  8:35 AM   Specimen: BLOOD  Result Value Ref Range Status   Specimen Description   Final    BLOOD BLOOD RIGHT FOREARM Performed at [facility] [address] [place], [place] [address]    Special Requests   Final    BOTTLES DRAWN AEROBIC AND ANAEROBIC Blood Culture adequate volume Performed at [facility] [address], [place], [place] [address]    Culture   Final    NO GROWTH 5 DAYS Performed at [facility], [place] [address], [place],  [address]    Report Status 08/29/2021 FINAL  Final  Resp Panel by RT-PCR (Flu A&B, Covid) Nasopharyngeal Swab  Status: None   Collection Time: 08/24/21  8:45 AM   Specimen: Nasopharyngeal Swab; Nasopharyngeal(NP) swabs in vial transport medium  Result Value Ref Range Status   SARS Coronavirus 2 by RT PCR NEGATIVE NEGATIVE Final    Comment: (NOTE) SARS-CoV-2 target nucleic acids are NOT DETECTED.  The SARS-CoV-2 RNA is generally detectable in upper respiratory specimens  during the acute phase of infection. The lowest concentration of SARS-CoV-2 viral copies this assay can detect is 138 copies/mL. A negative result does not preclude SARS-Cov-2 infection and should not be used as the sole basis for treatment or other patient management decisions. A negative result may occur with  improper specimen collection/handling, submission of specimen other than nasopharyngeal swab, presence of viral mutation(s) within the areas targeted by this assay, and inadequate number of viral copies(<138 copies/mL). A negative result must be combined with clinical observations, patient history, and epidemiological information. The expected result is Negative.  Fact Sheet for Patients:  EntrepreneurPulse.com.au  Fact Sheet for Healthcare Providers:  IncredibleEmployment.be  This test is no t yet approved or cleared by the [place] FDA and  has been authorized for detection and/or diagnosis of SARS-CoV-2 by FDA under an Emergency Use Authorization (EUA). This EUA will remain  in effect (meaning this test can be used) for the duration of the COVID-19 declaration under Section 564(b)(1) of the Act, 21 U.S.C.section 360bbb-3(b)(1), unless the authorization is terminated  or revoked sooner.       Influenza A by PCR NEGATIVE NEGATIVE Final   Influenza B by PCR NEGATIVE NEGATIVE Final    Comment: (NOTE) The Xpert Xpress SARS-CoV-2/FLU/RSV plus assay is intended as an aid in the diagnosis of influenza from Nasopharyngeal swab specimens and should not be used as a sole basis for treatment. Nasal washings and aspirates are unacceptable for Xpert Xpress SARS-CoV-2/FLU/RSV testing.  Fact Sheet for Patients: EntrepreneurPulse.com.au  Fact Sheet for Healthcare Providers: IncredibleEmployment.be  This test is not yet approved or cleared by the [place] FDA and has been authorized for detection  and/or diagnosis of SARS-CoV-2 by FDA under an Emergency Use Authorization (EUA). This EUA will remain in effect (meaning this test can be used) for the duration of the COVID-19 declaration under Section 564(b)(1) of the Act, 21 U.S.C. section 360bbb-3(b)(1), unless the authorization is terminated or revoked.  Performed at [facility], [place] [address], [place], [place] [address]   MRSA Next Gen by PCR, Nasal     Status: None   Collection Time: 08/25/21 11:23 AM   Specimen: Nasal Mucosa; Nasal Swab  Result Value Ref Range Status   MRSA by PCR Next Gen NOT DETECTED NOT DETECTED Final    Comment: (NOTE) The GeneXpert MRSA Assay (FDA approved for NASAL specimens only), is one component of a comprehensive MRSA colonization surveillance program. It is not intended to diagnose MRSA infection nor to guide or monitor treatment for MRSA infections. Test performance is not FDA approved in patients less than 7 years old. Performed at [facility], [place] [address], [place], [place] [address]     Labs: CBC: Recent Labs  Lab 08/24/21 0800 08/25/21 0149 08/26/21 0350 08/27/21 0354 08/28/21 0405  WBC 14.0* 10.6* 10.9* 11.6* 10.4  NEUTROABS 12.0*  --  9.9* 9.8*  --   HGB 12.4* 10.7* 11.1* 11.1* 10.5*  HCT 37.0* 31.8* 33.6* 33.3* 31.6*  MCV 100.0 99.4 99.1 99.1 100.3*  PLT 159 133* 151 181 440   Basic Metabolic Panel: Recent Labs  Lab 08/25/21 0149 08/26/21 0350 08/27/21 0354 08/28/21 0405 08/29/21 0402 08/29/21 0952  NA 132* 130* 134* 133* 133*  --   K 4.5 3.7 3.7 3.5 3.1*  --   CL 100 101 101 100 98  --   CO2 '25 24 25 26 27  '$ --   GLUCOSE 116* 122* 126* 119* 129*  --   BUN 26* 24* 20 24* 19  --   CREATININE 0.95 0.80 0.64 0.74 0.62  --   CALCIUM 7.5* 7.2* 7.3* 7.1* 7.3*  --   MG  --  1.6* 2.1  --  2.1  --   PHOS  --   --   --   --   --  2.6   Liver Function Tests: Recent Labs  Lab 08/24/21 0800 08/25/21 0149  AST 36 24  ALT 15 16   ALKPHOS 47 38  BILITOT 1.8* 0.5  PROT 6.9 5.2*  ALBUMIN 3.4* 2.6*   CBG: No results for input(s): GLUCAP in the last 168 hours.  Discharge time spent: greater than 30 minutes.  Signed: Elmarie Shiley, MD [facility] 08/30/2021

## 2021-08-31 ENCOUNTER — Encounter: Payer: Self-pay | Admitting: Adult Health

## 2021-08-31 ENCOUNTER — Non-Acute Institutional Stay (SKILLED_NURSING_FACILITY): Payer: PPO | Admitting: Adult Health

## 2021-08-31 DIAGNOSIS — R531 Weakness: Secondary | ICD-10-CM

## 2021-08-31 DIAGNOSIS — J690 Pneumonitis due to inhalation of food and vomit: Secondary | ICD-10-CM

## 2021-08-31 DIAGNOSIS — Z86711 Personal history of pulmonary embolism: Secondary | ICD-10-CM | POA: Diagnosis not present

## 2021-08-31 DIAGNOSIS — B372 Candidiasis of skin and nail: Secondary | ICD-10-CM

## 2021-08-31 DIAGNOSIS — I5033 Acute on chronic diastolic (congestive) heart failure: Secondary | ICD-10-CM | POA: Diagnosis not present

## 2021-08-31 DIAGNOSIS — L8942 Pressure ulcer of contiguous site of back, buttock and hip, stage 2: Secondary | ICD-10-CM | POA: Diagnosis not present

## 2021-08-31 NOTE — Progress Notes (Unsigned)
Location:  [facility] Room Number: 116 A Place of Service:  SNF (31) Provider:  Durenda Age, DNP, FNP-BC  Patient Care Team: Deland Pretty, MD as PCP - General (Internal Medicine) Stanford Breed Denice Bors, MD as PCP - Internal Medicine (Cardiology) Lelon Perla, MD as PCP - Cardiology (Cardiology)  Extended Emergency Contact Information Primary Emergency Contact: Wacha,Gilda Address: [address] [place], [place] [address] [name] Phone: [phone] Mobile Phone: [phone] Relation: Spouse  Code Status:  FULL CODE  Goals of care: Advanced Directive information Advanced Directives 08/31/2021  Does Patient Have a Medical Advance Directive? No  Does patient want to make changes to medical advance directive? -  Would patient like information on creating a medical advance directive? -  Pre-existing out of facility DNR order (yellow form or pink MOST form) -     Chief Complaint  Patient presents with   Hospitalization Follow-up    Hospital follow up.    HPI:  Pt is a 81 y.o. male seen today for medical management of chronic diseases.  ***   Past Medical History:  Diagnosis Date   AAA (abdominal aortic aneurysm) followed by dr Stanford Breed   last duplex 07-17-2017  3.2 x 3.3   Anticoagulant long-term use    xarelto for hx PEs and DVT   Arthritis    back   Barrett's esophagus 2009   Bilateral lower extremity edema    chronic pedal   CAD (coronary artery disease) cardioloigst--  dr Stanford Breed   cardiac cath 04-12-2014  single vessel obstructive cad involving ostrium D2, and other nonobstructive cad , normall lvf   Chronic lower back pain    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[place])    per duplex 07-17-2017  left cia 2 x 2, right cia 2.1 x 2   Diverticulosis    Dyspnea    with exertion    Dyspnea on exertion    FH: colonic polyps    GERD (gastroesophageal reflux disease)    Gout    08-27-2017  per  pt stable   History of adenomatous polyp of colon    History of DVT of lower extremity    10/ 2015  LLE   History of esophageal stricture    post dilation   History of gastric polyp    benign   History of pulmonary embolus (PE)    2005  treated 1 yr w/ coumadin and 10/ 2015  bilateral   History of radiation therapy 06/22/2012-08/18/2012   78 Gy to prostate (external beam)   History of subdural hematoma 03/2012   s/p  evacuation   Hypertension    Lesion of bladder    non canerous   Prostate cancer [facility]) urologist-  dr wrenn/ oncologist- dr Sondra Come   dx 07/ 2013--  Stage T1c, [place] 7, vol 67cc--- completed external beam radiation therapy 08-18-2012   Pulmonary HTN ([place]) 04/12/2014   last echo 05-01-2017  PASP 38mHg   RBBB (right bundle branch block with left anterior fascicular block)    ocassional   Past Surgical History:  Procedure Laterality Date   BIOPSY  07/11/2018   Procedure: BIOPSY;  Surgeon: JMilus Banister MD;  Location: [facility];  Service: Endoscopy;;   CATARACT EXTRACTION W/ INTRAOCULAR LENS  IMPLANT, BILATERAL  2018   COLONOSCOPY WITH PROPOFOL N/A 04/25/2015   Procedure: COLONOSCOPY WITH PROPOFOL;  Surgeon: Manus Gunning, MD;  Location: [facility];  Service: Gastroenterology;  Laterality: N/A;   COLONOSCOPY WITH PROPOFOL N/A 07/11/2018   Procedure: COLONOSCOPY WITH PROPOFOL;  Surgeon: Milus Banister, MD;  Location: [facility];  Service: Endoscopy;  Laterality: N/A;   CRANIOTOMY  04/03/2012   Procedure: CRANIOTOMY HEMATOMA EVACUATION SUBDURAL;  Surgeon: Erline Levine, MD;  Location: [facility];  Service: Neurosurgery;  Laterality: Left;  Left Craniotomy for Evacuation of Subdural Hematoma   CYSTOSCOPY WITH BIOPSY N/A 09/02/2017   Procedure: CYSTOSCOPY WITH BIOPSY AND FULGURATION POSSIBLE TRANSURETHRAL RESECTION OF BLADDER TUMOR;  Surgeon: [name], MD;  Location: [facility];  Service: Urology;  Laterality: N/A;    ESOPHAGOGASTRODUODENOSCOPY (EGD) WITH PROPOFOL N/A 04/25/2015   Procedure: ESOPHAGOGASTRODUODENOSCOPY (EGD) WITH PROPOFOL;  Surgeon: Manus Gunning, MD;  Location: [facility];  Service: Gastroenterology;  Laterality: N/A;   ESOPHAGOGASTRODUODENOSCOPY (EGD) WITH PROPOFOL N/A 07/11/2018   Procedure: ESOPHAGOGASTRODUODENOSCOPY (EGD) WITH PROPOFOL;  Surgeon: Milus Banister, MD;  Location: [facility];  Service: Endoscopy;  Laterality: N/A;   IRRIGATION AND DEBRIDEMENT SEBACEOUS CYST  01-11-13   "off my back"   KYPHOPLASTY N/A 12/23/2017   Procedure: Lumbar One KYPHOPLASTY;  Surgeon: Erline Levine, MD;  Location: [facility];  Service: Neurosurgery;  Laterality: N/A;  Lumbar One KYPHOPLASTY   LEFT AND RIGHT HEART CATHETERIZATION WITH CORONARY ANGIOGRAM N/A 04/12/2014   Procedure: LEFT AND RIGHT HEART CATHETERIZATION WITH CORONARY ANGIOGRAM;  Surgeon: Peter M Martinique, MD;  Location[facility] LAB;  Service: Cardiovascular;  Laterality: N/A;   ORIF FEMUR FRACTURE Left 10-02-2010   dr Alvan Dame   subtrochanteric   POLYPECTOMY  07/11/2018   Procedure: POLYPECTOMY;  Surgeon: Milus Banister, MD;  Location: [facility];  Service: Endoscopy;;   TONSILLECTOMY  1940's   TRANSTHORACIC ECHOCARDIOGRAM  05/01/2017   mild concentric LVH, ef 34-19%, grade 1 diastolic dysfunction/ trivial AR/ mild dilated ascending aorta/ mild to moderate MR (no stenosis)/ mild LAE/ mild TR/ mild to moderate increase PASP 63mHg    No Known Allergies  Outpatient Encounter Medications as of 08/31/2021  Medication Sig   acetaminophen (TYLENOL) 325 MG tablet Take 650 mg by mouth every 6 (six) hours as needed.   allopurinol (ZYLOPRIM) 300 MG tablet Take 300 mg by mouth every morning.    amoxicillin-clavulanate (AUGMENTIN) 875-125 MG tablet Take 1 tablet by mouth every 12 (twelve) hours for 3 days.   ferrous sulfate 325 (65 FE) MG tablet Take 1 tablet (325 mg total) by mouth daily for 30 days.   furosemide (LASIX) 40 MG tablet Take 1  tablet (40 mg total) by mouth daily.   levalbuterol (XOPENEX) 0.63 MG/3ML nebulizer solution Take 3 mLs (0.63 mg total) by nebulization every 8 (eight) hours as needed for wheezing or shortness of breath.   Multiple Vitamin (MULTIVITAMIN WITH MINERALS) TABS tablet Take 1 tablet by mouth daily.   olmesartan (BENICAR) 20 MG tablet TAKE 1 TABLET(20 MG) BY MOUTH DAILY   omeprazole (PRILOSEC) 20 MG capsule Take 1 capsule (20 mg total) by mouth daily. Please keep your March Appointment for further refills. Thank you   OXYGEN Inhale 2 L into the lungs.   Vitamin D, Ergocalciferol, (DRISDOL) 1.25 MG (50000 UNIT) CAPS capsule Take 1 capsule (50,000 Units total) by mouth every 7 (seven) days.   XARELTO 20 MG TABS tablet TAKE 1 TABLET(20 MG) BY MOUTH DAILY WITH SUPPER   [DISCONTINUED] gentamicin cream (GARAMYCIN) 0.1 % Apply 1 application topically See admin instructions. Apply to  sacral-area pressure sores 2 times a day   [DISCONTINUED] thiamine 100 MG tablet Take 1 tablet (100 mg total) by mouth daily.   No facility-administered encounter medications on file as of 08/31/2021.    Review of Systems ***    Immunization History  Administered Date(s) Administered   DTaP 10/15/2012   Influenza, High Dose Seasonal PF 04/07/2015, 04/15/2016   Influenza, Quadrivalent, Recombinant, Inj, Pf 02/27/2018, 02/10/2019, 03/10/2020, 03/02/2021   Influenza,inj,Quad PF,6+ Mos 04/23/2017   Influenza-Unspecified 03/19/2012, 04/09/2013, 02/22/2014, 04/28/2014   PFIZER(Purple Top)SARS-COV-2 Vaccination 07/06/2019, 07/26/2019, 03/28/2020   Pfizer Covid-19 Vaccine Bivalent Booster 77yr & up 05/10/2021   Pneumococcal Conjugate-13 11/08/2013   Pneumococcal Polysaccharide-23 08/23/2003   Td 02/06/2006   Tdap 10/15/2012   Zoster, Live 02/04/2006   Pertinent  Health Maintenance Due  Topic Date Due   COLONOSCOPY (Pts 45-439yrInsurance coverage will need to be confirmed)  07/12/2023   INFLUENZA VACCINE  Completed    Fall Risk 08/28/2021 08/28/2021 08/29/2021 08/29/2021 08/30/2021  Patient Fall Risk Level High fall risk High fall risk High fall risk High fall risk High fall risk     Vitals:   08/31/21 1104  BP: 124/71  Pulse: 84  Temp: 97.9 F (36.6 C)  SpO2: 99%  Weight: 214 lb (97.1 kg)  Height: 6' (1.829 m)   Body mass index is 29.02 kg/m.  Physical Exam     Labs reviewed: Recent Labs    08/26/21 0350 08/27/21 0354 08/28/21 0405 08/29/21 0402 08/29/21 0952  NA 130* 134* 133* 133*  --   K 3.7 3.7 3.5 3.1*  --   CL 101 101 100 98  --   CO2 '24 25 26 27  '$ --   GLUCOSE 122* 126* 119* 129*  --   BUN 24* 20 24* 19  --   CREATININE 0.80 0.64 0.74 0.62  --   CALCIUM 7.2* 7.3* 7.1* 7.3*  --   MG 1.6* 2.1  --  2.1  --   PHOS  --   --   --   --  2.6   Recent Labs    08/24/21 0800 08/25/21 0149  AST 36 24  ALT 15 16  ALKPHOS 47 38  BILITOT 1.8* 0.5  PROT 6.9 5.2*  ALBUMIN 3.4* 2.6*   Recent Labs    08/24/21 0800 08/25/21 0149 08/26/21 0350 08/27/21 0354 08/28/21 0405  WBC 14.0*   < > 10.9* 11.6* 10.4  NEUTROABS 12.0*  --  9.9* 9.8*  --   HGB 12.4*   < > 11.1* 11.1* 10.5*  HCT 37.0*   < > 33.6* 33.3* 31.6*  MCV 100.0   < > 99.1 99.1 100.3*  PLT 159   < > 151 181 214   < > = values in this interval not displayed.   Lab Results  Component Value Date   TSH 2.056 08/01/2014   No results found for: HGBA1C No results found for: CHOL, HDL, LDLCALC, LDLDIRECT, TRIG, CHOLHDL  Significant Diagnostic Results in last 30 days:  DG Chest Port 1 View  Result Date: 08/24/2021 CLINICAL DATA:  Questionable sepsis. Increased weakness. Decreased mobility. Low-grade fever. EXAM: PORTABLE CHEST 1 VIEW COMPARISON:  Two-view chest x-ray 07/09/2018 FINDINGS: Heart size is scratched at the heart is enlarged. Atherosclerotic calcifications are present at the aortic arch. Lung volumes are low bilaterally. Bibasilar airspace opacities are present. Degenerative changes are noted at the ACB[facility]oints  bilaterally. IMPRESSION: Low lung volumes with bibasilar airspace disease. While this likely reflects atelectasis,  infection is not excluded. Electronically Signed   By: San Morelle M.D.   On: 08/24/2021 09:17   ECHOCARDIOGRAM COMPLETE  Result Date: 08/29/2021    ECHOCARDIOGRAM REPORT   Patient Name:   [name] Date of Exam: 08/29/2021 Medical Rec #:  [id number]      Height:       72.0 in Accession #:    [id number]     Weight:       215.0 lb Date of Birth:  1941-04-15       BSA:          2.197 m Patient Berry:    57 years       BP:           146/87 mmHg Patient Gender: M              HR:           91 bpm. Exam Location:  Inpatient Procedure: 2D Echo Indications:    Dyspnea  History:        Patient has prior history of Echocardiogram examinations, most                 recent 10/24/2017. CAD, Signs/Symptoms:Shortness of Breath; Risk                 Factors:Hypertension.  Sonographer:    Arlyss Gandy Referring Phys: [id number] [place]  Sonographer Comments: No apical window and no subcostal window. Image acquisition challenging due to patient body habitus. IMPRESSIONS  1. Left ventricular ejection fraction, by estimation, is 55 to 60%. The left ventricle has normal function. The left ventricle has no regional wall motion abnormalities. There is mild concentric left ventricular hypertrophy. Left ventricular diastolic function could not be evaluated.  2. Right ventricular systolic function was not well visualized. The right ventricular size is moderately enlarged. Tricuspid regurgitation signal is inadequate for assessing PA pressure.  3. The mitral valve is grossly normal. Trivial mitral valve regurgitation. No evidence of mitral stenosis.  4. Tricuspid valve regurgitation is mild to moderate.  5. The aortic valve is tricuspid. There is mild calcification of the aortic valve. There is mild thickening of the aortic valve. Aortic valve regurgitation is trivial. Aortic valve sclerosis/calcification  is present, without any evidence of aortic stenosis. Comparison(s): Prior images reviewed side by side. Conclusion(s)/Recommendation(s): Otherwise normal echocardiogram, with minor abnormalities described in the report. Very limited windows. LV appears grossly normal. RV not well seen but appears enlarged relative to LV size. No severe valve disease noted by images or Doppler. FINDINGS  Left Ventricle: Left ventricular ejection fraction, by estimation, is 55 to 60%. The left ventricle has normal function. The left ventricle has no regional wall motion abnormalities. The left ventricular internal cavity size was normal in size. There is  mild concentric left ventricular hypertrophy. Left ventricular diastolic function could not be evaluated. Right Ventricle: The right ventricular size is moderately enlarged. Right vetricular wall thickness was not well visualized. Right ventricular systolic function was not well visualized. Tricuspid regurgitation signal is inadequate for assessing PA pressure. Left Atrium: Left atrial size was not well visualized. Right Atrium: Right atrial size was not well visualized. Pericardium: There is no evidence of pericardial effusion. Mitral Valve: The mitral valve is grossly normal. Trivial mitral valve regurgitation. No evidence of mitral valve stenosis. Tricuspid Valve: The tricuspid valve is grossly normal. Tricuspid valve regurgitation is mild to moderate. No evidence of tricuspid stenosis. Aortic Valve: The aortic valve is tricuspid. There  is mild calcification of the aortic valve. There is mild thickening of the aortic valve. Aortic valve regurgitation is trivial. Aortic valve sclerosis/calcification is present, without any evidence of aortic stenosis. Pulmonic Valve: The pulmonic valve was grossly normal. Pulmonic valve regurgitation is trivial. No evidence of pulmonic stenosis. Aorta: The aortic root and ascending aorta are structurally normal, with no evidence of dilitation.  Venous: The inferior vena cava was not well visualized. IAS/Shunts: The interatrial septum was not assessed.  LEFT VENTRICLE PLAX 2D LVIDd:         4.10 cm LVIDs:         2.80 cm LV PW:         1.20 cm LV IVS:        1.20 cm LVOT diam:     2.40 cm LVOT Area:     4.52 cm  LEFT ATRIUM         Index LA diam:    3.60 cm 1.64 cm/m   AORTA Ao Root diam: 3.70 cm Ao Asc diam:  3.60 cm TRICUSPID VALVE TR Peak grad:   29.8 mmHg TR Vmax:        273.00 cm/s  SHUNTS Systemic Diam: 2.40 cm Buford Dresser MD Electronically signed by Buford Dresser MD Signature Date/Time: 08/29/2021/3:02:49 PM    Final     Assessment/Plan ***   Family/ staff Communication: Discussed plan of care with resident and charge nurse  Labs/tests ordered:     Jeffery Age, DNP, MSN, FNP-BC [facility] and Adult Medicine [phone] (Monday-Friday 8:00 a.m. - 5:00 p.m.) [phone] (after hours)

## 2021-09-01 ENCOUNTER — Encounter: Payer: Self-pay | Admitting: Internal Medicine

## 2021-09-01 ENCOUNTER — Non-Acute Institutional Stay (SKILLED_NURSING_FACILITY): Payer: Self-pay | Admitting: Internal Medicine

## 2021-09-01 DIAGNOSIS — R531 Weakness: Secondary | ICD-10-CM

## 2021-09-01 DIAGNOSIS — I5033 Acute on chronic diastolic (congestive) heart failure: Secondary | ICD-10-CM

## 2021-09-01 DIAGNOSIS — E46 Unspecified protein-calorie malnutrition: Secondary | ICD-10-CM | POA: Insufficient documentation

## 2021-09-01 DIAGNOSIS — E440 Moderate protein-calorie malnutrition: Secondary | ICD-10-CM

## 2021-09-01 DIAGNOSIS — J690 Pneumonitis due to inhalation of food and vomit: Secondary | ICD-10-CM

## 2021-09-01 NOTE — Patient Instructions (Signed)
See assessment and plan under each diagnosis in the problem list and acutely for this visit 

## 2021-09-01 NOTE — Progress Notes (Signed)
? ?NURSING HOME LOCATION:  [employer] ?ROOM NUMBER:   ? ?CODE STATUS: Full code ? ?PCP: Deland Pretty, MD ? ?This is a comprehensive admission note to this SNFperformed on this date less than 30 days from date of admission. ?Included are preadmission medical/surgical history; reconciled medication list; family history; social history and comprehensive review of systems.  ?Corrections and additions to the records were documented. Comprehensive physical exam was also performed. Additionally a clinical summary was entered for each active diagnosis pertinent to this admission in the Problem List to enhance continuity of care. ? ?HPI: He was hospitalized 3/3 - 08/30/2021 presenting from home with recurrent falls in the context of fever and weakness present for several days.  Scattered wheezing was noted and increased work of breathing was present clinically with tachypnea and tachycardia.  Chest x-ray revealed low lung volumes with suggestion of atelectasis; basilar pneumonia could not be ruled out.  White count was 14,000 with 12% neutrophils indicating left shift.  Procalcitonin was 5.26 suggesting infection.  Lactic acid was 2.9.  Possible aspiration pneumonia was postulated.  Empiric parenteral antibiotics were initiated. ?Course was complicated by the development of volume overload consistent with acute diastolic congestive heart failure.  This was associated with bilateral lower extremity edema as well as scrotal edema.  JVD was present on exam.  He had a good response to diuretics with output of 2.8 L and was transitioned to oral furosemide. ?Parenteral antibiotics were transitioned to Augmentin. ?Pressure injury of the lower back was present in the context of a lipoma.  Surgical resection was to be scheduled as an outpatient. ?Initial H/H was 12.4/37.  H/H dropped to 10.5/31.6 with a MCV of 100.3.  B12 was supranormal.  There is no current folate; the last 1 on record was normal in 2019.   CKD stage II was present with a creatinine of 0.62 and GFR greater than 60.  Protein caloric malnutrition was suggested by total protein of 5.2 and albumin of 2.6. ?Vitamin D level was 15.83 with normals of 30-100; 50,000 units of vitamin D weekly was initiated. ?Speech therapy recommended aspiration precautions.  PT/OT recommended SNF placement for rehab. ? ?Past medical and surgical history: Includes history of AAA, GERD with Barrett's esophagus, CAD, chronic low back pain, common iliac aneurysm, diverticulosis, history of adenomatous colon polyp, history of gout, history of prostate cancer, essential hypertension, history of subdural hematoma, history of PTE, history of gastric polyp, and history of pulmonary hypertension. ?Surgeries and seizures include colonoscopy, craniotomy, cystoscopy, EGD, cardiac catheterization, ORIF femur fracture, and colon polypectomy. ? ?Social history: Social alcohol; former smoker with at least 20 pack years.  Retired Psychologist, clinical.  He is originally from [place]. ? ?Family history: Noncontributory due to advanced age. ?  ?Review of systems: Some neurocognitive deficit was suggested as his history was often contradicted by his wife.  When asked why he had been in the hospital his response was "no idea".  He complained that he had seen "3 different doctors".  He does deny dysphagia.  He states that he has had anorexia since admission.  He describes a constant discomfort in the low back related to the "sore".  It stated that it been present for a couple of weeks; his wife states he has had this lesion for months.  Despite the fact that outpatient elective surgery was to be scheduled he stated the last surgeon told him to "leave it alone".  He states he has had swelling of the scrotum  for 3 days.  His wife states that it was present at admission. ?He denies any paroxysmal nocturnal dyspnea.  He states that the peripheral edema is improved but the scrotal swelling persists.  He has no  other GU symptoms. ?His wife validates that he snores occasionally but there is no apnea.  He denies any history of emphysema despite a diagnosis of pulmonary artery hypertension in the past medical history. ? ?Constitutional: No fever, significant weight change  ?Eyes: No redness, discharge, pain, vision change ?ENT/mouth: No nasal congestion, purulent discharge, earache, change in hearing, sore throat  ?Cardiovascular: No chest pain, palpitations, claudication, edema  ?Respiratory: No cough, sputum production, hemoptysis ?Gastrointestinal: No heartburn, abdominal pain, nausea /vomiting, rectal bleeding, melena, change in bowels ?Genitourinary: No dysuria, hematuria, pyuria, incontinence, nocturia ?Musculoskeletal: No joint stiffness, joint swelling ?Dermatologic: No rash, pruritus ?Neurologic: No dizziness, headache, syncope, seizures, numbness, tingling ?Psychiatric: No significant anxiety, depression, insomnia ?Endocrine: No change in hair/skin/nails, excessive thirst, excessive hunger, excessive urination  ?Hematologic/lymphatic: No significant bruising, lymphadenopathy, abnormal bleeding ?Allergy/immunology: No itchy/watery eyes, significant sneezing, urticaria, angioedema ? ?Physical exam:  ?Pertinent or positive findings: Pattern alopecia is present.  He keeps his neck flexed.  Eyes are sunken.  His responses are delayed and his voice is loud.  He has complete dentures.  Eschar is present below the left nare.  A gallop cadence is present.  He exhibits an intermittent nonproductive cough.  Breath sounds are decreased posteriorly; bronchovesicular quality is suggested anteriorly.  There is dullness to percussion in the right upper quadrant without definite hepatomegaly.  Thorax height appears to be foreshortened.  Abdomen is protuberant.  Scrotal edema is present.  He has 1/2+ edema at the sock line and 1+ edema at the ankles.  Dorsalis pedis pulses are stronger than posterior tibial pulses.  He is weak to  opposition in all extremities.  Interosseous wasting is noted.  Slight clubbing is suggested.  He has a flexion contracture of the fifth right digit.  A polyp was noted over the right lateral orbital area.  Wound care nurse documented a left posterior/inferior lipoma draining serosanguineous material.  He also has a small sacral wound. ? ?General appearance: no acute distress, increased work of breathing is present.   ?Lymphatic: No lymphadenopathy about the head, neck, axilla. ?Eyes: No conjunctival inflammation or lid edema is present. There is no scleral icterus. ?Ears:  External ear exam shows no significant lesions or deformities.   ?Nose:  External nasal examination shows no deformity or inflammation. Nasal mucosa are pink and moist without lesions, exudates ?Oral exam: Lips and gums are healthy appearing.There is no oropharyngeal erythema or exudate. ?Neck:  No thyromegaly, masses, tenderness noted.    ?Heart:  No murmur, click, rub.  ?Lungs:  without wheezes, rhonchi, rales, rubs. ?Abdomen: Bowel sounds are normal.  Abdomen is soft and nontender with no hernias, masses. ?GU: Deferred  ?Extremities:  No cyanosis ?Neurologic exam:  Balance, Rhomberg, finger to nose testing could not be completed due to clinical state ?Skin: Warm & dry w/o tenting. ?No significant rash. ? ?See clinical summary under each active problem in the Problem List with associated updated therapeutic plan ? ?

## 2021-09-01 NOTE — Assessment & Plan Note (Addendum)
Complete Augmentin at SNF.  Continue pulmonary toilet with Xopenex. ?

## 2021-09-01 NOTE — Assessment & Plan Note (Signed)
Continue diuretic at the SNF.  Nutrition consult for protein repletion. ?

## 2021-09-01 NOTE — Assessment & Plan Note (Addendum)
Current total protein 5.2, albumin 2.6.  Weakness to opposition in all extremities.  Interosseous wasting and and sunken orbits present. ?Nutrition consult at SNF for protein repletion. ?The need for protein repletion explained to him and his wife in reference to wound healing as well as addressing the scrotal and peripheral edema. ?

## 2021-09-02 NOTE — Assessment & Plan Note (Signed)
Multifactorial, including advanced comorbidities & protein/caloric malnutrition.  PT/OT at SNF. ?

## 2021-09-04 ENCOUNTER — Encounter: Payer: Self-pay | Admitting: Adult Health

## 2021-09-04 ENCOUNTER — Non-Acute Institutional Stay (SKILLED_NURSING_FACILITY): Payer: PPO | Admitting: Adult Health

## 2021-09-04 DIAGNOSIS — N492 Inflammatory disorders of scrotum: Secondary | ICD-10-CM | POA: Diagnosis not present

## 2021-09-04 DIAGNOSIS — J690 Pneumonitis due to inhalation of food and vomit: Secondary | ICD-10-CM | POA: Diagnosis not present

## 2021-09-04 LAB — COMPREHENSIVE METABOLIC PANEL: Calcium: 7.7 — AB (ref 8.7–10.7)

## 2021-09-04 LAB — BASIC METABOLIC PANEL
BUN: 29 — AB (ref 4–21)
CO2: 27 — AB (ref 13–22)
Chloride: 93 — AB (ref 99–108)
Creatinine: 0.7 (ref 0.6–1.3)
Glucose: 129
Potassium: 3.8 mEq/L (ref 3.5–5.1)
Sodium: 130 — AB (ref 137–147)

## 2021-09-04 LAB — CBC: RBC: 3.2 — AB (ref 3.87–5.11)

## 2021-09-04 LAB — CBC AND DIFFERENTIAL
HCT: 31 — AB (ref 41–53)
Hemoglobin: 10 — AB (ref 13.5–17.5)
Platelets: 470 10*3/uL — AB (ref 150–400)
WBC: 15.3

## 2021-09-04 NOTE — Progress Notes (Signed)
? ?Location:  [facility] ?Nursing Home Room Number: 989-Q ?Place of Service:  SNF (31) ?Provider:  Durenda Age, DNP, FNP-BC ? ?Patient Care Team: ?Deland Pretty, MD as PCP - General (Internal Medicine) ?Lelon Perla, MD as PCP - Internal Medicine (Cardiology) ?Lelon Perla, MD as PCP - Cardiology (Cardiology) ? ?Extended Emergency Contact Information ?Primary Emergency Contact: Gorr,Gilda ?Address: [address] ?         [place] ?         [place], [place] [address] [place] ?Home Phone: [phone] ?Mobile Phone: [phone] ?Relation: Spouse ? ?Code Status:  FULL CODE ? ?Goals of care: Advanced Directive information ?Advanced Directives 09/04/2021  ?Does Patient Have a Medical Advance Directive? No  ?Does patient want to make changes to medical advance directive? -  ?Would patient like information on creating a medical advance directive? No - Patient declined  ?Pre-existing out of facility DNR order (yellow form or pink MOST form) -  ? ? ? ?Chief Complaint  ?Patient presents with  ? Acute Visit  ?  Scrotal Drainage.   ? ? ?HPI:  ?Pt is a 81 y.o. male seen today for an acute visit for scrotal drainage. Staff reported that drainage squirted when they were changing his diaper. He was seen in his room today. Scrotum noted to be enlarged and erythematous. Brownish drainage with urine noted on his diaper. No reported fever nor chills. He has completed Augmentin course yesterday which is given for aspiration pneumonia.  ? ? ?Past Medical History:  ?Diagnosis Date  ? AAA (abdominal aortic aneurysm) followed by dr Stanford Breed  ? last duplex 07-17-2017  3.2 x 3.3  ? Anticoagulant long-term use   ? xarelto for hx PEs and DVT  ? Arthritis   ? back  ? Barrett's esophagus 2009  ? Bilateral lower extremity edema   ? chronic pedal  ? CAD (coronary artery disease) cardioloigst--  dr Stanford Breed  ? cardiac cath 04-12-2014  single vessel obstructive cad involving ostrium D2, and other  nonobstructive cad , normall lvf  ? Chronic lower back pain   ? 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[place])   ? per duplex 07-17-2017  left cia 2 x 2, right cia 2.1 x 2  ? Diverticulosis   ? Dyspnea on exertion   ? FH: colonic polyps   ? GERD (gastroesophageal reflux disease)   ? Gout   ? 08-27-2017  per pt stable  ? History of adenomatous polyp of colon   ? History of DVT of lower extremity   ? 10/ 2015  LLE  ? History of esophageal stricture   ? post dilation  ? History of gastric polyp   ? benign  ? History of pulmonary embolus (PE)   ? 2005  treated 1 yr w/ coumadin and 10/ 2015  bilateral  ? History of radiation therapy 06/22/2012-08/18/2012  ? 78 Gy to prostate (external beam)  ? History of subdural hematoma 03/2012  ? s/p  evacuation  ? Hypertension   ? Lesion of bladder   ? non canerous  ? Prostate cancer [facility]) urologist-  dr wrenn/ oncologist- dr Sondra Come  ? dx 07/ 2013--  Stage T1c, Gleson 7, vol 67cc--- completed external beam radiation therapy 08-18-2012  ? Pulmonary HTN ([place]) 04/12/2014  ? last echo 05-01-2017  PASP 32mHg  ? RBBB (right bundle branch block with left anterior fascicular block)   ? ocassional  ? ?Past Surgical History:  ?Procedure Laterality Date  ? BIOPSY  07/11/2018  ? Procedure: BIOPSY;  Surgeon: Milus Banister, MD;  Location: [facility];  Service: Endoscopy;;  ? CATARACT EXTRACTION W/ INTRAOCULAR LENS  IMPLANT, BILATERAL  2018  ? COLONOSCOPY WITH PROPOFOL N/A 04/25/2015  ? Procedure: COLONOSCOPY WITH PROPOFOL;  Surgeon: Manus Gunning, MD;  Location: [facility];  Service: Gastroenterology;  Laterality: N/A;  ? COLONOSCOPY WITH PROPOFOL N/A 07/11/2018  ? Procedure: COLONOSCOPY WITH PROPOFOL;  Surgeon: Milus Banister, MD;  [facility]: [facility];  Service: Endoscopy;  Laterality: N/A;  ? CRANIOTOMY  04/03/2012  ? Procedure: CRANIOTOMY HEMATOMA EVACUATION SUBDURAL;  Surgeon: Erline Levine, MD;  Location: [place] [facility];  Service: Neurosurgery;  Laterality: Left;  Left Craniotomy for Evacuation  of Subdural Hematoma  ? CYSTOSCOPY WITH BIOPSY N/A 09/02/2017  ? Procedure: CYSTOSCOPY WITH BIOPSY AND FULGURATION POSSIBLE TRANSURETHRAL RESECTION OF BLADDER TUMOR;  Surgeon: [name], MD;  Location: [facility];  Service: Urology;  Laterality: N/A;  ? ESOPHAGOGASTRODUODENOSCOPY (EGD) WITH PROPOFOL N/A 04/25/2015  ? Procedure: ESOPHAGOGASTRODUODENOSCOPY (EGD) WITH PROPOFOL;  Surgeon: Manus Gunning, MD;  [facility];  Service: Gastroenterology;  Laterality: N/A;  ? ESOPHAGOGASTRODUODENOSCOPY (EGD) WITH PROPOFOL N/A 07/11/2018  ? Procedure: ESOPHAGOGASTRODUODENOSCOPY (EGD) WITH PROPOFOL;  Surgeon: Milus Banister, MD;  [facility];  Service: Endoscopy;  Laterality: N/A;  ? IRRIGATION AND DEBRIDEMENT SEBACEOUS CYST  01-11-13  ? "off my back"  ? KYPHOPLASTY N/A 12/23/2017  ? Procedure: Lumbar One KYPHOPLASTY;  Surgeon: Erline Levine, MD;  Location: [facility];  Service: Neurosurgery;  Laterality: N/A;  Lumbar One KYPHOPLASTY  ? LEFT AND RIGHT HEART CATHETERIZATION WITH CORONARY ANGIOGRAM N/A 04/12/2014  ? Procedure: LEFT AND RIGHT HEART CATHETERIZATION WITH CORONARY ANGIOGRAM;  Surgeon: Peter M Martinique, MD;  Location: [facility] CATH LAB;  Service: Cardiovascular;  Laterality: N/A;  ? ORIF FEMUR FRACTURE Left 10-02-2010   dr Alvan Dame  ? subtrochanteric  ? POLYPECTOMY  07/11/2018  ? Procedure: POLYPECTOMY;  Surgeon: Milus Banister, MD;  Location: [facility];  Service: Endoscopy;;  ? TONSILLECTOMY  1940's  ? TRANSTHORACIC ECHOCARDIOGRAM  05/01/2017  ? mild concentric LVH, ef 67-61%, grade 1 diastolic dysfunction/ trivial AR/ mild dilated ascending aorta/ mild to moderate MR (no stenosis)/ mild LAE/ mild TR/ mild to moderate increase PASP 51mHg  ? ? ?No Known Allergies ? ?Outpatient Encounter Medications as of 09/04/2021  ?Medication Sig  ? acetaminophen (TYLENOL) 325 MG tablet Take 650 mg by mouth every 6 (six) hours as needed.  ? allopurinol (ZYLOPRIM) 300 MG tablet Take 300 mg by mouth  every morning.   ? Amino Acids-Protein Hydrolys (PRO-STAT SUGAR FREE PO) Take 30 mLs by mouth daily at 12 noon.  ? amoxicillin-clavulanate (AUGMENTIN) 875-125 MG tablet Take 1 tablet by mouth every 12 (twelve) hours.  ? bisacodyl (DULCOLAX) 10 MG suppository Place 10 mg rectally as needed for moderate constipation.  ? ferrous sulfate 325 (65 FE) MG tablet Take 1 tablet (325 mg total) by mouth daily for 30 days.  ? furosemide (LASIX) 40 MG tablet Take 1 tablet (40 mg total) by mouth daily.  ? levalbuterol (XOPENEX) 0.63 MG/3ML nebulizer solution Take 3 mLs (0.63 mg total) by nebulization every 8 (eight) hours as needed for wheezing or shortness of breath.  ? Magnesium Hydroxide (MILK OF MAGNESIA PO) Take 30 mLs by mouth as needed.  ? Multiple Vitamin (MULTIVITAMIN WITH MINERALS) TABS tablet Take 1 tablet by mouth daily.  ? nystatin ointment (MYCOSTATIN) Apply 1 application. topically 2 (two) times daily. Apply to scrotum.  ?  olmesartan (BENICAR) 20 MG tablet TAKE 1 TABLET(20 MG) BY MOUTH DAILY  ? omeprazole (PRILOSEC) 20 MG capsule Take 1 capsule (20 mg total) by mouth daily. Please keep your March Appointment for further refills. Thank you  ? OXYGEN Inhale 2 L into the lungs.  ? Sodium Phosphates (RA SALINE ENEMA RE) Place rectally as needed.  ? Thiamine HCl (VITAMIN B1) 100 MG TABS Take 1 tablet by mouth daily.  ? Vitamin D, Ergocalciferol, (DRISDOL) 1.25 MG (50000 UNIT) CAPS capsule Take 1 capsule (50,000 Units total) by mouth every 7 (seven) days.  ? XARELTO 20 MG TABS tablet TAKE 1 TABLET(20 MG) BY MOUTH DAILY WITH SUPPER  ? ?No facility-administered encounter medications on file as of 09/04/2021.  ? ? ?Review of Systems  ?Constitutional:  Negative for activity change, appetite change and fever.  ?HENT:  Negative for sore throat.   ?Eyes: Negative.   ?Cardiovascular:  Negative for chest pain and leg swelling.  ?Gastrointestinal:  Negative for abdominal distention, diarrhea and vomiting.  ?Genitourinary:  Negative  for dysuria, frequency and urgency.  ?Skin:  Negative for color change.  ?Neurological:  Negative for dizziness and headaches.  ?Psychiatric/Behavioral:  Negative for behavioral problems and sleep disturbance.

## 2021-09-06 ENCOUNTER — Encounter: Payer: Self-pay | Admitting: Adult Health

## 2021-09-06 ENCOUNTER — Non-Acute Institutional Stay (SKILLED_NURSING_FACILITY): Payer: PPO | Admitting: Adult Health

## 2021-09-06 DIAGNOSIS — N492 Inflammatory disorders of scrotum: Secondary | ICD-10-CM | POA: Diagnosis not present

## 2021-09-06 DIAGNOSIS — N5082 Scrotal pain: Secondary | ICD-10-CM

## 2021-09-06 NOTE — Progress Notes (Signed)
? ?Location:  [facility] ?Nursing Home Room Number: 213-Y ?Place of Service:  SNF (31) ?Provider:  Durenda Age, DNP, FNP-BC ? ?Patient Care Team: ?Deland Pretty, MD as PCP - General (Internal Medicine) ?Lelon Perla, MD as PCP - Internal Medicine (Cardiology) ?Lelon Perla, MD as PCP - Cardiology (Cardiology) ? ?Extended Emergency Contact Information ?Primary Emergency Contact: Allsup,Gilda ?Address: [address] ?         [place] ?         [place], [place] [address] [place] ?Home Phone: [phone] ?Mobile Phone: [phone] ?Relation: Spouse ? ?Code Status:  Full Code ? ?Goals of care: Advanced Directive information ? ?  09/10/2021  ?  2:59 PM  ?Advanced Directives  ?Does Patient Have a Medical Advance Directive? No  ?Would patient like information on creating a medical advance directive? No - Patient declined  ? ? ? ?Chief Complaint  ?Patient presents with  ? Acute Visit  ?  Scrotal cellulitis and pain  ? ? ?HPI:  ?Pt is a 81 y.o. male seen today for follow up on scrotal infection. He is currently taking Augmentin for scrotal cellulitis. Noted scrotum to have moderate pus drainage. Scrotum remains to be enlarged and erythematous. He complains having pain on his scrotum especially at night. No reported fever nor chills.  ? ? ?Past Medical History:  ?Diagnosis Date  ? AAA (abdominal aortic aneurysm) followed by dr Stanford Breed  ? last duplex 07-17-2017  3.2 x 3.3  ? Anticoagulant long-term use   ? xarelto for hx PEs and DVT  ? Arthritis   ? back  ? Barrett's esophagus 2009  ? Bilateral lower extremity edema   ? chronic pedal  ? CAD (coronary artery disease) cardioloigst--  dr Stanford Breed  ? cardiac cath 04-12-2014  single vessel obstructive cad involving ostrium D2, and other nonobstructive cad , normall lvf  ? Chronic lower back pain   ? 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[place])   ? per duplex 07-17-2017  left cia 2 x 2, right cia 2.1 x 2  ? Diverticulosis   ? Dyspnea on exertion   ? FH:  colonic polyps   ? GERD (gastroesophageal reflux disease)   ? Gout   ? 08-27-2017  per pt stable  ? History of adenomatous polyp of colon   ? History of DVT of lower extremity   ? 10/ 2015  LLE  ? History of esophageal stricture   ? post dilation  ? History of gastric polyp   ? benign  ? History of pulmonary embolus (PE)   ? 2005  treated 1 yr w/ coumadin and 10/ 2015  bilateral  ? History of radiation therapy 06/22/2012-08/18/2012  ? 78 Gy to prostate (external beam)  ? History of subdural hematoma 03/2012  ? s/p  evacuation  ? Hypertension   ? Lesion of bladder   ? non canerous  ? Prostate cancer [facility]) urologist-  dr wrenn/ oncologist- dr Sondra Come  ? dx 07/ 2013--  Stage T1c, Gleson 7, vol 67cc--- completed external beam radiation therapy 08-18-2012  ? Pulmonary HTN ([place]) 04/12/2014  ? last echo 05-01-2017  PASP 69mHg  ? RBBB (right bundle branch block with left anterior fascicular block)   ? ocassional  ? ?Past Surgical History:  ?Procedure Laterality Date  ? BIOPSY  07/11/2018  ? Procedure: BIOPSY;  Surgeon: JMilus Banister MD;  [facility];  Service: Endoscopy;;  ? CATARACT EXTRACTION W/ INTRAOCULAR LENS  IMPLANT, BILATERAL  2018  ?  COLONOSCOPY WITH PROPOFOL N/A 04/25/2015  ? Procedure: COLONOSCOPY WITH PROPOFOL;  Surgeon: Manus Gunning, MD;  Location: [facility];  Service: Gastroenterology;  Laterality: N/A;  ? COLONOSCOPY WITH PROPOFOL N/A 07/11/2018  ? Procedure: COLONOSCOPY WITH PROPOFOL;  Surgeon: Milus Banister, MD;  [facility];  Service: Endoscopy;  Laterality: N/A;  ? CRANIOTOMY  04/03/2012  ? Procedure: CRANIOTOMY HEMATOMA EVACUATION SUBDURAL;  Surgeon: Erline Levine, MD;  Location: [place] [facility] ORS;  Service: Neurosurgery;  Laterality: Left;  Left Craniotomy for Evacuation of Subdural Hematoma  ? CYSTOSCOPY WITH BIOPSY N/A 09/02/2017  ? Procedure: CYSTOSCOPY WITH BIOPSY AND FULGURATION POSSIBLE TRANSURETHRAL RESECTION OF BLADDER TUMOR;  Surgeon: [name], MD;  [facility];  Service: Urology;  Laterality: N/A;  ? ESOPHAGOGASTRODUODENOSCOPY (EGD) WITH PROPOFOL N/A 04/25/2015  ? Procedure: ESOPHAGOGASTRODUODENOSCOPY (EGD) WITH PROPOFOL;  Surgeon: Manus Gunning, MD;  [facility];  Service: Gastroenterology;  Laterality: N/A;  ? ESOPHAGOGASTRODUODENOSCOPY (EGD) WITH PROPOFOL N/A 07/11/2018  ? Procedure: ESOPHAGOGASTRODUODENOSCOPY (EGD) WITH PROPOFOL;  Surgeon: Milus Banister, MD;  [facility];  Service: Endoscopy;  Laterality: N/A;  ? IRRIGATION AND DEBRIDEMENT SEBACEOUS CYST  01-11-13  ? "off my back"  ? KYPHOPLASTY N/A 12/23/2017  ? Procedure: Lumbar One KYPHOPLASTY;  Surgeon: Erline Levine, MD;  Location: [place];  Service: Neurosurgery;  Laterality: N/A;  Lumbar One KYPHOPLASTY  ? LEFT AND RIGHT HEART CATHETERIZATION WITH CORONARY ANGIOGRAM N/A 04/12/2014  ? Procedure: LEFT AND RIGHT HEART CATHETERIZATION WITH CORONARY ANGIOGRAM;  Surgeon: Peter M Martinique, MD;  Location: [facility] CATH LAB;  Service: Cardiovascular;  Laterality: N/A;  ? ORIF FEMUR FRACTURE Left 10-02-2010   dr Alvan Dame  ? subtrochanteric  ? POLYPECTOMY  07/11/2018  ? Procedure: POLYPECTOMY;  Surgeon: Milus Banister, MD;  Location: [facility];  Service: Endoscopy;;  ? TONSILLECTOMY  1940's  ? TRANSTHORACIC ECHOCARDIOGRAM  05/01/2017  ? mild concentric LVH, ef 16-10%, grade 1 diastolic dysfunction/ trivial AR/ mild dilated ascending aorta/ mild to moderate MR (no stenosis)/ mild LAE/ mild TR/ mild to moderate increase PASP 36mHg  ? ? ?No Known Allergies ? ?Outpatient Encounter Medications as of 09/06/2021  ?Medication Sig  ? acetaminophen (TYLENOL) 325 MG tablet Take 650 mg by mouth every 6 (six) hours as needed.  ? allopurinol (ZYLOPRIM) 300 MG tablet Take 300 mg by mouth every morning.   ? Amino Acids-Protein Hydrolys (PRO-STAT SUGAR FREE PO) Take 30 mLs by mouth daily at 12 noon.  ? bisacodyl (DULCOLAX) 10 MG suppository Place 10 mg rectally as needed for moderate  constipation.  ? furosemide (LASIX) 40 MG tablet Take 1 tablet (40 mg total) by mouth daily.  ? gentamicin cream (GARAMYCIN) 0.1 % Apply one application to sacral-area pressure sores twice daily  ? levalbuterol (XOPENEX) 0.63 MG/3ML nebulizer solution Take 3 mLs (0.63 mg total) by nebulization every 8 (eight) hours as needed for wheezing or shortness of breath.  ? Magnesium Hydroxide (MILK OF MAGNESIA PO) Take 30 mLs by mouth as needed.  ? Multiple Vitamin (MULTIVITAMIN WITH MINERALS) TABS tablet Take 1 tablet by mouth daily.  ? nystatin ointment (MYCOSTATIN) Apply 1 application. topically 2 (two) times daily. Apply to scrotum.  ? olmesartan (BENICAR) 20 MG tablet TAKE 1 TABLET(20 MG) BY MOUTH DAILY  ? omeprazole (PRILOSEC) 20 MG capsule Take 1 capsule (20 mg total) by mouth daily. Please keep your March Appointment for further refills. Thank you  ? OXYGEN Inhale 2 L into the lungs.  ? Sodium Phosphates (  RA SALINE ENEMA RE) Place rectally as needed.  ? Thiamine HCl (VITAMIN B1) 100 MG TABS Take 1 tablet by mouth daily.  ? Vitamin D, Ergocalciferol, (DRISDOL) 1.25 MG (50000 UNIT) CAPS capsule Take 1 capsule (50,000 Units total) by mouth every 7 (seven) days.  ? XARELTO 20 MG TABS tablet TAKE 1 TABLET(20 MG) BY MOUTH DAILY WITH SUPPER  ? [DISCONTINUED] amoxicillin-clavulanate (AUGMENTIN) 875-125 MG tablet Take 1 tablet by mouth every 12 (twelve) hours.  ? [DISCONTINUED] HYDROcodone-acetaminophen (NORCO) 5-325 MG tablet ADMINISTER 1 TABLET BY MOUTH EVERY 8 HOURS AS NEEDED FOR PAIN  ? ferrous sulfate 325 (65 FE) MG tablet Take 1 tablet (325 mg total) by mouth daily for 30 days.  ? ?No facility-administered encounter medications on file as of 09/06/2021.  ? ? ?Review of Systems  ?Constitutional:  Negative for activity change, appetite change and fever.  ?HENT:  Negative for sore throat.   ?Eyes: Negative.   ?Cardiovascular:  Negative for chest pain and leg swelling.  ?Gastrointestinal:  Negative for abdominal distention,  diarrhea and vomiting.  ?Genitourinary:  Positive for scrotal swelling. Negative for dysuria, frequency and urgency.  ?Skin:  Negative for color change.  ?Neurological:  Negative for dizziness and headaches.

## 2021-09-07 ENCOUNTER — Other Ambulatory Visit: Payer: Self-pay | Admitting: Adult Health

## 2021-09-07 MED ORDER — HYDROCODONE-ACETAMINOPHEN 5-325 MG PO TABS: 1.0000 | ORAL_TABLET | Freq: Three times a day (TID) | ORAL | 0 refills | Status: DC | PRN

## 2021-09-10 ENCOUNTER — Encounter: Payer: Self-pay | Admitting: Adult Health

## 2021-09-10 ENCOUNTER — Non-Acute Institutional Stay (SKILLED_NURSING_FACILITY): Payer: PPO | Admitting: Adult Health

## 2021-09-10 DIAGNOSIS — N492 Inflammatory disorders of scrotum: Secondary | ICD-10-CM | POA: Diagnosis not present

## 2021-09-10 DIAGNOSIS — Z86711 Personal history of pulmonary embolism: Secondary | ICD-10-CM

## 2021-09-10 DIAGNOSIS — R1084 Generalized abdominal pain: Secondary | ICD-10-CM

## 2021-09-10 NOTE — Progress Notes (Signed)
? ?Location:  [facility] ?Nursing Home Room Number: 458-K ?Place of Service:  SNF (31) ?Provider:  Durenda Age, DNP, FNP-BC ? ?Patient Care Team: ?Deland Pretty, MD as PCP - General (Internal Medicine) ?Lelon Perla, MD as PCP - Internal Medicine (Cardiology) ?Lelon Perla, MD as PCP - Cardiology (Cardiology) ? ?Extended Emergency Contact Information ?Primary Emergency Contact: Nichter,Gilda ?Address: [address] ?         [place] ?         [place], [place] [address] [place] ?Home Phone: [phone] ?Mobile Phone: [phone] ?Relation: Spouse ? ?Code Status:  Full Code ? ?Goals of care: Advanced Directive information ?Advanced Directives 09/10/2021  ?Does Patient Have a Medical Advance Directive? No  ?Does patient want to make changes to medical advance directive? -  ?Would patient like information on creating a medical advance directive? No - Patient declined  ?Pre-existing out of facility DNR order (yellow form or pink MOST form) -  ? ? ? ?Chief Complaint  ?Patient presents with  ? Acute Visit  ?  Pain management  ? ? ?HPI:  ?Pt is a 81 y.o. male seen today for an acute visit for pain management. He complained of abdominal pain. KUB done showed no bowel obstruction. He, also, complained of pain on his scrotum at night. He is currently taking Ceftriaxone 1 gm IM daily for scrotal infection. Scrotal swelling and drainage has now decreased. Erythema on scrotum is minimal. No noted SOB. He takes Xarelto 20 mg daily for PE. ? ? ?Past Medical History:  ?Diagnosis Date  ? AAA (abdominal aortic aneurysm) followed by dr Stanford Breed  ? last duplex 07-17-2017  3.2 x 3.3  ? Anticoagulant long-term use   ? xarelto for hx PEs and DVT  ? Arthritis   ? back  ? Barrett's  esophagus 2009  ? Bilateral lower extremity edema   ? chronic pedal  ? CAD (coronary artery disease) cardioloigst--  dr Stanford Breed  ? cardiac cath 04-12-2014  single vessel obstructive cad involving ostrium D2, and other nonobstructive cad , normall lvf  ? Chronic lower back pain   ? 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[place])   ? per duplex 07-17-2017  left cia 2 x 2, right cia 2.1 x 2  ? Diverticulosis   ? Dyspnea on exertion   ? FH: colonic polyps   ? GERD (gastroesophageal reflux disease)   ? Gout   ? 08-27-2017  per pt stable  ? History of adenomatous polyp of colon   ? History of DVT of lower extremity   ? 10/ 2015  LLE  ? History of esophageal stricture   ? post dilation  ? History of gastric polyp   ? benign  ? History of pulmonary embolus (PE)   ? 2005  treated 1 yr w/ coumadin and 10/ 2015  bilateral  ? History of radiation therapy 06/22/2012-08/18/2012  ? 78 Gy to prostate (external beam)  ? History of subdural hematoma 03/2012  ? s/p  evacuation  ? Hypertension   ? Lesion of bladder   ? non canerous  ? Prostate cancer [facility]) urologist-  dr wrenn/ oncologist- dr Sondra Come  ? dx 07/ 2013--  Stage T1c, Gleson 7, vol 67cc--- completed external beam radiation therapy 08-18-2012  ? Pulmonary HTN ([place]) 04/12/2014  ? last echo 05-01-2017  PASP 102mHg  ? RBBB (right bundle branch block with left anterior fascicular block)   ? ocassional  ? ?Past Surgical History:  ?Procedure Laterality Date  ?  BIOPSY  07/11/2018  ? Procedure: BIOPSY;  Surgeon: Milus Banister, MD;  Location: [facility];  Service: Endoscopy;;  ? CATARACT EXTRACTION W/ INTRAOCULAR LENS  IMPLANT, BILATERAL  2018  ? COLONOSCOPY WITH PROPOFOL N/A 04/25/2015  ? Procedure: COLONOSCOPY WITH PROPOFOL;  Surgeon: Manus Gunning, MD;  Location: [facility];  Service: Gastroenterology;  Laterality: N/A;  ? COLONOSCOPY WITH PROPOFOL N/A 07/11/2018  ? Procedure: COLONOSCOPY WITH PROPOFOL;  Surgeon: Milus Banister, MD;  Location: [facility];  Service: Endoscopy;   Laterality: N/A;  ? CRANIOTOMY  04/03/2012  ? Procedure: CRANIOTOMY HEMATOMA EVACUATION SUBDURAL;  Surgeon: Erline Levine, MD;  Location: [facility];  Service: Neurosurgery;  Laterality: Left;  Left Craniotomy for Evacuation of Subdural Hematoma  ? CYSTOSCOPY WITH BIOPSY N/A 09/02/2017  ? Procedure: CYSTOSCOPY WITH BIOPSY AND FULGURATION POSSIBLE TRANSURETHRAL RESECTION OF BLADDER TUMOR;  Surgeon: [name], MD;  Location: [facility];  Service: Urology;  Laterality: N/A;  ? ESOPHAGOGASTRODUODENOSCOPY (EGD) WITH PROPOFOL N/A 04/25/2015  ? Procedure: ESOPHAGOGASTRODUODENOSCOPY (EGD) WITH PROPOFOL;  Surgeon: Manus Gunning, MD;  [facility]: [facility];  Service: Gastroenterology;  Laterality: N/A;  ? ESOPHAGOGASTRODUODENOSCOPY (EGD) WITH PROPOFOL N/A 07/11/2018  ? Procedure: ESOPHAGOGASTRODUODENOSCOPY (EGD) WITH PROPOFOL;  Surgeon: Milus Banister, MD;  [facility]: [facility];  Service: Endoscopy;  Laterality: N/A;  ? IRRIGATION AND DEBRIDEMENT SEBACEOUS CYST  01-11-13  ? "off my back"  ? KYPHOPLASTY N/A 12/23/2017  ? Procedure: Lumbar One KYPHOPLASTY;  Surgeon: Erline Levine, MD;  Location: [facility];  Service: Neurosurgery;  Laterality: N/A;  Lumbar One KYPHOPLASTY  ? LEFT AND RIGHT HEART CATHETERIZATION WITH CORONARY ANGIOGRAM N/A 04/12/2014  ? Procedure: LEFT AND RIGHT HEART CATHETERIZATION WITH CORONARY ANGIOGRAM;  Surgeon: Peter M Martinique, MD;  Location: [facility] CATH LAB;  Service: Cardiovascular;  Laterality: N/A;  ? ORIF FEMUR FRACTURE Left 10-02-2010   dr Alvan Dame  ? subtrochanteric  ? POLYPECTOMY  07/11/2018  ? Procedure: POLYPECTOMY;  Surgeon: Milus Banister, MD;  Location: [facility];  Service: Endoscopy;;  ? TONSILLECTOMY  1940's  ? TRANSTHORACIC ECHOCARDIOGRAM  05/01/2017  ? mild concentric LVH, ef 68-08%, grade 1 diastolic dysfunction/ trivial AR/ mild dilated ascending aorta/ mild to moderate MR (no stenosis)/ mild LAE/ mild TR/ mild to moderate increase PASP 34mHg  ? ? ?No Known  Allergies ? ?Outpatient Encounter Medications as of 09/10/2021  ?Medication Sig  ? acetaminophen (TYLENOL) 325 MG tablet Take 650 mg by mouth every 6 (six) hours as needed.  ? allopurinol (ZYLOPRIM) 300 MG tablet Take 300 mg by mouth every morning.   ? Amino Acids-Protein Hydrolys (PRO-STAT SUGAR FREE PO) Take 30 mLs by mouth daily at 12 noon.  ? bisacodyl (DULCOLAX) 10 MG suppository Place 10 mg rectally as needed for moderate constipation.  ? cefTRIAXone (ROCEPHIN) 1 g injection INJECT 1GM INTRAMUSCULARLY ONCE DAILY FOR 10 DAYS FOR SCROTUM INFECTION (RECONSTITUTE WITH 2.1ML OF DILUENT)  ? furosemide (LASIX) 40 MG tablet Take 1 tablet (40 mg total) by mouth daily.  ? gentamicin cream (GARAMYCIN) 0.1 % Apply one application to sacral-area pressure sores twice daily  ? HYDROcodone-acetaminophen (NORCO) 5-325 MG tablet Take 1 tablet by mouth every 8 (eight) hours as needed for moderate pain. ADMINISTER 1 TABLET BY MOUTH EVERY 8 HOURS AS NEEDED FOR PAIN  ? levalbuterol (XOPENEX) 0.63 MG/3ML nebulizer solution Take 3 mLs (0.63 mg total) by nebulization every 8 (eight) hours as needed for wheezing or shortness of breath.  ? Magnesium Hydroxide (MILK OF MAGNESIA PO) Take  30 mLs by mouth as needed.  ? Multiple Vitamin (MULTIVITAMIN WITH MINERALS) TABS tablet Take 1 tablet by mouth daily.  ? nystatin ointment (MYCOSTATIN) Apply 1 application. topically 2 (two) times daily. Apply to scrotum.  ? olmesartan (BENICAR) 20 MG tablet TAKE 1 TABLET(20 MG) BY MOUTH DAILY  ? omeprazole (PRILOSEC) 20 MG capsule Take 1 capsule (20 mg total) by mouth daily. Please keep your March Appointment for further refills. Thank you  ? OXYGEN Inhale 2 L into the lungs.  ? Sodium Phosphates (RA SALINE ENEMA RE) Place rectally as needed.  ? Thiamine HCl (VITAMIN B1) 100 MG TABS Take 1 tablet by mouth daily.  ? Vitamin D, Ergocalciferol, (DRISDOL) 1.25 MG (50000 UNIT) CAPS capsule Take 1 capsule (50,000 Units total) by mouth every 7 (seven) days.  ?  XARELTO 20 MG TABS tablet TAKE 1 TABLET(20 MG) BY MOUTH DAILY WITH SUPPER  ? ferrous sulfate 325 (65 FE) MG tablet Take 1 tablet (325 mg total) by mouth daily for 30 days.  ? [DISCONTINUED] amoxicillin-clavulanate (AUGM

## 2021-09-11 LAB — CBC AND DIFFERENTIAL
HCT: 32 — AB (ref 41–53)
Hemoglobin: 11.1 — AB (ref 13.5–17.5)
Platelets: 420 10*3/uL — AB (ref 150–400)
WBC: 5.8

## 2021-09-11 LAB — CBC: RBC: 3.3 — AB (ref 3.87–5.11)

## 2021-09-11 LAB — COMPREHENSIVE METABOLIC PANEL: Calcium: 8.5 — AB (ref 8.7–10.7)

## 2021-09-11 LAB — BASIC METABOLIC PANEL
BUN: 33 — AB (ref 4–21)
CO2: 29 — AB (ref 13–22)
Chloride: 101 (ref 99–108)
Creatinine: 0.8 (ref 0.6–1.3)
Glucose: 99
Potassium: 4.4 mEq/L (ref 3.5–5.1)
Sodium: 139 (ref 137–147)

## 2021-09-17 DIAGNOSIS — D171 Benign lipomatous neoplasm of skin and subcutaneous tissue of trunk: Secondary | ICD-10-CM | POA: Diagnosis not present

## 2021-09-17 DIAGNOSIS — L89141 Pressure ulcer of left lower back, stage 1: Secondary | ICD-10-CM | POA: Diagnosis not present

## 2021-09-20 DIAGNOSIS — N492 Inflammatory disorders of scrotum: Secondary | ICD-10-CM | POA: Diagnosis not present

## 2021-09-24 ENCOUNTER — Non-Acute Institutional Stay (SKILLED_NURSING_FACILITY): Payer: PPO | Admitting: Adult Health

## 2021-09-24 ENCOUNTER — Encounter: Payer: Self-pay | Admitting: Adult Health

## 2021-09-24 ENCOUNTER — Other Ambulatory Visit: Payer: Self-pay | Admitting: Adult Health

## 2021-09-24 DIAGNOSIS — N492 Inflammatory disorders of scrotum: Secondary | ICD-10-CM

## 2021-09-24 DIAGNOSIS — I10 Essential (primary) hypertension: Secondary | ICD-10-CM

## 2021-09-24 DIAGNOSIS — Z86711 Personal history of pulmonary embolism: Secondary | ICD-10-CM | POA: Diagnosis not present

## 2021-09-24 MED ORDER — HYDROCODONE-ACETAMINOPHEN 5-325 MG PO TABS: 1.0000 | ORAL_TABLET | Freq: Three times a day (TID) | ORAL | 0 refills | Status: DC | PRN

## 2021-09-24 MED ORDER — HYDROCODONE-ACETAMINOPHEN 5-325 MG PO TABS: 1.0000 | ORAL_TABLET | Freq: Every day | ORAL | 0 refills | Status: DC

## 2021-09-24 NOTE — Progress Notes (Signed)
? ?Location:  [facility] ?Nursing Home Room Number: 417-E ?Place of Service:  SNF (31) ?Provider:  Durenda Age, DNP, FNP-BC ? ?Patient Care Team: ?Deland Pretty, MD as PCP - General (Internal Medicine) ?Lelon Perla, MD as PCP - Internal Medicine (Cardiology) ?Lelon Perla, MD as PCP - Cardiology (Cardiology) ? ?Extended Emergency Contact Information ?Primary Emergency Contact: Donnellan,Gilda ?Address: [address] ?         [address] ?         [place], [place] [address] [place] ?Home Phone: [phone] ?Mobile Phone: [phone] ?Relation: Spouse ? ?Code Status:  Full Code ? ?Goals of care: Advanced Directive information ? ?  09/24/2021  ? 11:36 AM  ?Advanced Directives  ?Does Patient Have a Medical Advance Directive? No  ?Would patient like information on creating a medical advance directive? No - Patient declined  ? ? ? ?Chief Complaint  ?Patient presents with  ? Acute Visit  ?  Short term rehab  ? ? ?HPI:  ?Pt is a 81 y.o. male seen today for short-term care rehabilitation visit. He is currently having PT and OT. SBPs ranging from 116 to 139, with outlier 107. He takes olmesartan Medoximil 20 mg 1 tab daily for hypertension.  No reported shortness of breath.  He takes Xarelto 20 mg 1 tab in the evening for pulmonary embolism.  He continues to take doxycycline 100 mg 1 tab twice a day for a total of 14 days, till 10/04/2021, for scrotal cellulitis.  Scrotum with minimal drainage and no erythema noted. ? ? ?Past Medical History:  ?Diagnosis Date  ? AAA (abdominal aortic aneurysm) ([place]) followed by dr Stanford Breed  ? last duplex 07-17-2017  3.2 x 3.3  ? Anticoagulant long-term use   ? xarelto for hx PEs and DVT  ? Arthritis   ? back  ? Barrett's esophagus 2009  ? Bilateral lower extremity edema   ? chronic pedal  ? CAD (coronary artery disease) cardioloigst--  dr Stanford Breed  ? cardiac cath 04-12-2014  single vessel obstructive cad involving ostrium D2, and other nonobstructive  cad , normall lvf  ? Chronic lower back pain   ? 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[place])   ? per duplex 07-17-2017  left cia 2 x 2, right cia 2.1 x 2  ? Diverticulosis   ? Dyspnea on exertion   ? FH: colonic polyps   ? GERD (gastroesophageal reflux disease)   ? Gout   ? 08-27-2017  per pt stable  ? History of adenomatous polyp of colon   ? History of DVT of lower extremity   ? 10/ 2015  LLE  ? History of esophageal stricture   ? post dilation  ? History of gastric polyp   ? benign  ? History of pulmonary embolus (PE)   ? 2005  treated 1 yr w/ coumadin and 10/ 2015  bilateral  ? History of radiation therapy 06/22/2012-08/18/2012  ? 78 Gy to prostate (external beam)  ? History of subdural hematoma 03/2012  ? s/p  evacuation  ? Hypertension   ? Lesion of bladder   ? non canerous  ? Prostate cancer [facility]) urologist-  dr wrenn/ oncologist- dr Sondra Come  ? dx 07/ 2013--  Stage T1c, Gleson 7, vol 67cc--- completed external beam radiation therapy 08-18-2012  ? Pulmonary HTN ([place]) 04/12/2014  ? last echo 05-01-2017  PASP 82mHg  ? RBBB (right bundle branch block with left anterior fascicular block)   ? ocassional  ? ?Past  Surgical History:  ?Procedure Laterality Date  ? BIOPSY  07/11/2018  ? Procedure: BIOPSY;  Surgeon: Milus Banister, MD;  Location: [facility];  Service: Endoscopy;;  ? CATARACT EXTRACTION W/ INTRAOCULAR LENS  IMPLANT, BILATERAL  2018  ? COLONOSCOPY WITH PROPOFOL N/A 04/25/2015  ? Procedure: COLONOSCOPY WITH PROPOFOL;  Surgeon: Manus Gunning, MD;  Location: [facility];  Service: Gastroenterology;  Laterality: N/A;  ? COLONOSCOPY WITH PROPOFOL N/A 07/11/2018  ? Procedure: COLONOSCOPY WITH PROPOFOL;  Surgeon: Milus Banister, MD;  [facility];  Service: Endoscopy;  Laterality: N/A;  ? CRANIOTOMY  04/03/2012  ? Procedure: CRANIOTOMY HEMATOMA EVACUATION SUBDURAL;  Surgeon: Erline Levine, MD;  Location: [facility];  Service: Neurosurgery;  Laterality: Left;  Left Craniotomy for Evacuation of Subdural  Hematoma  ? CYSTOSCOPY WITH BIOPSY N/A 09/02/2017  ? Procedure: CYSTOSCOPY WITH BIOPSY AND FULGURATION POSSIBLE TRANSURETHRAL RESECTION OF BLADDER TUMOR;  Surgeon: [name], MD;  Location: [facility];  Service: Urology;  Laterality: N/A;  ? ESOPHAGOGASTRODUODENOSCOPY (EGD) WITH PROPOFOL N/A 04/25/2015  ? Procedure: ESOPHAGOGASTRODUODENOSCOPY (EGD) WITH PROPOFOL;  Surgeon: Manus Gunning, MD;  [facility];  Service: Gastroenterology;  Laterality: N/A;  ? ESOPHAGOGASTRODUODENOSCOPY (EGD) WITH PROPOFOL N/A 07/11/2018  ? Procedure: ESOPHAGOGASTRODUODENOSCOPY (EGD) WITH PROPOFOL;  Surgeon: Milus Banister, MD;  [facility];  Service: Endoscopy;  Laterality: N/A;  ? IRRIGATION AND DEBRIDEMENT SEBACEOUS CYST  01-11-13  ? "off my back"  ? KYPHOPLASTY N/A 12/23/2017  ? Procedure: Lumbar One KYPHOPLASTY;  Surgeon: Erline Levine, MD;  Location: [facility];  Service: Neurosurgery;  Laterality: N/A;  Lumbar One KYPHOPLASTY  ? LEFT AND RIGHT HEART CATHETERIZATION WITH CORONARY ANGIOGRAM N/A 04/12/2014  ? Procedure: LEFT AND RIGHT HEART CATHETERIZATION WITH CORONARY ANGIOGRAM;  Surgeon: Peter M Martinique, MD;  Location: [facility] CATH LAB;  Service: Cardiovascular;  Laterality: N/A;  ? ORIF FEMUR FRACTURE Left 10-02-2010   dr Alvan Dame  ? subtrochanteric  ? POLYPECTOMY  07/11/2018  ? Procedure: POLYPECTOMY;  Surgeon: Milus Banister, MD;  Location: [facility];  Service: Endoscopy;;  ? TONSILLECTOMY  1940's  ? TRANSTHORACIC ECHOCARDIOGRAM  05/01/2017  ? mild concentric LVH, ef 62-83%, grade 1 diastolic dysfunction/ trivial AR/ mild dilated ascending aorta/ mild to moderate MR (no stenosis)/ mild LAE/ mild TR/ mild to moderate increase PASP 67mHg  ? ? ?No Known Allergies ? ?Outpatient Encounter Medications as of 09/24/2021  ?Medication Sig  ? acetaminophen (TYLENOL) 325 MG tablet Take 650 mg by mouth every 6 (six) hours as needed.  ? allopurinol (ZYLOPRIM) 300 MG tablet Take 300 mg by mouth every morning.    ? Amino Acids-Protein Hydrolys (PRO-STAT SUGAR FREE PO) Take 30 mLs by mouth daily at 12 noon.  ? bisacodyl (DULCOLAX) 10 MG suppository Place 10 mg rectally as needed for moderate constipation.  ? DOXYCYCLINE HYCLATE PO Take 100 mg by mouth in the morning and at bedtime. FOR SCROTAL WOUND  ? furosemide (LASIX) 40 MG tablet Take 1 tablet (40 mg total) by mouth daily.  ? HYDROcodone-acetaminophen (NORCO) 5-325 MG tablet Take 1 tablet by mouth every 8 (eight) hours as needed for moderate pain. ADMINISTER 1 TABLET BY MOUTH EVERY 8 HOURS AS NEEDED FOR PAIN  ? HYDROcodone-acetaminophen (NORCO/VICODIN) 5-325 MG tablet Take 1 tablet by mouth daily.  ? levalbuterol (XOPENEX) 0.63 MG/3ML nebulizer solution Take 3 mLs (0.63 mg total) by nebulization every 8 (eight) hours as needed for wheezing or shortness of breath.  ? Magnesium Hydroxide (MILK OF MAGNESIA PO)  Take 30 mLs by mouth as needed.  ? Multiple Vitamin (MULTIVITAMIN WITH MINERALS) TABS tablet Take 1 tablet by mouth daily.  ? olmesartan (BENICAR) 20 MG tablet TAKE 1 TABLET(20 MG) BY MOUTH DAILY  ? omeprazole (PRILOSEC) 20 MG capsule Take 1 capsule (20 mg total) by mouth daily. Please keep your March Appointment for further refills. Thank you  ? OXYGEN Inhale 2 L into the lungs.  ? Sodium Phosphates (RA SALINE ENEMA RE) Place rectally as needed.  ? Thiamine HCl (VITAMIN B1) 100 MG TABS Take 1 tablet by mouth daily.  ? Vitamin D, Ergocalciferol, (DRISDOL) 1.25 MG (50000 UNIT) CAPS capsule Take 1 capsule (50,000 Units total) by mouth every 7 (seven) days.  ? XARELTO 20 MG TABS tablet TAKE 1 TABLET(20 MG) BY MOUTH DAILY WITH SUPPER  ? ferrous sulfate 325 (65 FE) MG tablet Take 1 tablet (325 mg total) by mouth daily for 30 days.  ? [DISCONTINUED] gentamicin cream (GARAMYCIN) 0.1 % Apply one application to sacral-area pressure sores twice daily  ? [DISCONTINUED] nystatin ointment (MYCOSTATIN) Apply 1 application. topically 2 (two) times daily. Apply to scrotum.  ? ?No  facility-administered encounter medications on file as of 09/24/2021.  ? ? ?Review of Systems  ?Constitutional:  Negative for activity change, appetite change and fever.  ?HENT:  Negative for sore throat.   ?Eye

## 2021-10-03 ENCOUNTER — Non-Acute Institutional Stay (SKILLED_NURSING_FACILITY): Payer: PPO | Admitting: Adult Health

## 2021-10-03 ENCOUNTER — Encounter: Payer: Self-pay | Admitting: Adult Health

## 2021-10-03 DIAGNOSIS — N492 Inflammatory disorders of scrotum: Secondary | ICD-10-CM

## 2021-10-03 DIAGNOSIS — I5032 Chronic diastolic (congestive) heart failure: Secondary | ICD-10-CM | POA: Diagnosis not present

## 2021-10-03 DIAGNOSIS — K219 Gastro-esophageal reflux disease without esophagitis: Secondary | ICD-10-CM

## 2021-10-03 DIAGNOSIS — Z86711 Personal history of pulmonary embolism: Secondary | ICD-10-CM | POA: Diagnosis not present

## 2021-10-03 DIAGNOSIS — R531 Weakness: Secondary | ICD-10-CM

## 2021-10-03 DIAGNOSIS — I10 Essential (primary) hypertension: Secondary | ICD-10-CM

## 2021-10-03 NOTE — Progress Notes (Addendum)
? ?Location:  [facility] ?Nursing Home Room Number: 767-H ?Place of Service:  SNF (31) ?Provider:  Durenda Age, DNP, FNP-BC ? ?Patient Care Team: ?Deland Pretty, MD as PCP - General (Internal Medicine) ?Lelon Perla, MD as PCP - Internal Medicine (Cardiology) ?Lelon Perla, MD as PCP - Cardiology (Cardiology) ? ?Extended Emergency Contact Information ?Primary Emergency Contact: Mccartt,Gilda ?Address: [address] ?         [place] ?         [place], [place] [address] [place] ?Home Phone: [phone] ?Mobile Phone: [phone] ?Relation: Spouse ? ?Code Status:  Full code  ? ?Goals of care: Advanced Directive information ? ?  10/03/2021  ? 11:35 AM  ?Advanced Directives  ?Does Patient Have a Medical Advance Directive? No  ?Would patient like information on creating a medical advance directive? No - Patient declined  ? ? ? ?Chief Complaint  ?Patient presents with  ? Follow-up  ?  Short-term rehab follow-up   ? ? ?HPI:  ?Pt is a 81 y.o. male seen today for for short-term rehabilitation follow up. He is currently having PT and OT at [facility].  ? ?Cellulitis of scrotum -takes doxycycline 100 mg 1 tab twice a day with stop date 10/04/2021 ? ?Primary hypertension -  SBPs ranging from 103-135, with outlier 94 and 99.  He takes olmesartan 20 mg 1 tab daily ? ?History of pulmonary embolus (PE) -no SOB, takes Xarelto 20 mg in the evening ? ?Gastroesophageal reflux disease without esophagitis  -   denies acid reflux episodes, takes omeprazole 20 mg 1 capsule daily ? ?Generalized weakness -continues with PT and OT daily ? ? ? ?Past Medical History:  ?Diagnosis Date  ? AAA (abdominal aortic aneurysm) ([place]) followed by dr Stanford Breed  ? last duplex 07-17-2017  3.2 x 3.3  ? Anticoagulant long-term use   ? xarelto for hx PEs and DVT  ? Arthritis   ? back  ? Barrett's esophagus 2009  ? Bilateral lower extremity edema   ? chronic pedal  ? CAD (coronary artery  disease) cardioloigst--  dr Stanford Breed  ? cardiac cath 04-12-2014  single vessel obstructive cad involving ostrium D2, and other nonobstructive cad , normall lvf  ? Chronic lower back pain   ? 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[place])   ? per duplex 07-17-2017  left cia 2 x 2, right cia 2.1 x 2  ? Diverticulosis   ? Dyspnea on exertion   ? FH: colonic polyps   ? GERD (gastroesophageal reflux disease)   ? Gout   ? 08-27-2017  per pt stable  ? History of adenomatous polyp of colon   ? History of DVT of lower extremity   ? 10/ 2015  LLE  ? History of esophageal stricture   ? post dilation  ? History of gastric polyp   ? benign  ? History of pulmonary embolus (PE)   ? 2005  treated 1 yr w/ coumadin and 10/ 2015  bilateral  ? History of radiation therapy 06/22/2012-08/18/2012  ? 78 Gy to prostate (external beam)  ? History of subdural hematoma 03/2012  ? s/p  evacuation  ? Hypertension   ? Lesion of bladder   ? non canerous  ? Prostate cancer [facility]) urologist-  dr wrenn/ oncologist- dr Sondra Come  ? dx 07/ 2013--  Stage T1c, Gleson 7, vol 67cc--- completed external beam radiation therapy 08-18-2012  ? Pulmonary HTN ([place]) 04/12/2014  ? last echo 05-01-2017  PASP 52mHg  ? RBBB (right bundle branch block with left anterior fascicular block)   ? ocassional  ? ?Past Surgical History:  ?Procedure Laterality Date  ? BIOPSY  07/11/2018  ? Procedure: BIOPSY;  Surgeon: JMilus Banister MD;  [facility];  Service: Endoscopy;;  ? CATARACT EXTRACTION W/ INTRAOCULAR LENS  IMPLANT, BILATERAL  2018  ? COLONOSCOPY WITH PROPOFOL N/A 04/25/2015  ? Procedure: COLONOSCOPY WITH PROPOFOL;  Surgeon: SManus Gunning MD;  [facility];  Service: Gastroenterology;  Laterality: N/A;  ? COLONOSCOPY WITH PROPOFOL N/A 07/11/2018  ? Procedure: COLONOSCOPY WITH PROPOFOL;  Surgeon: JMilus Banister MD;  [facility];  Service: Endoscopy;  Laterality: N/A;  ? CRANIOTOMY  04/03/2012  ? Procedure: CRANIOTOMY HEMATOMA EVACUATION SUBDURAL;   Surgeon: JErline Levine MD;  [facility];  Service: Neurosurgery;  Laterality: Left;  Left Craniotomy for Evacuation of Subdural Hematoma  ? CYSTOSCOPY WITH BIOPSY N/A 09/02/2017  ? Procedure: CYSTOSCOPY WITH BIOPSY AND FULGURATION POSSIBLE TRANSURETHRAL RESECTION OF BLADDER TUMOR;  Surgeon: WIrine Seal MD;  [facility]  Service: Urology;  Laterality: N/A;  ? ESOPHAGOGASTRODUODENOSCOPY (EGD) WITH PROPOFOL N/A 04/25/2015  ? Procedure: ESOPHAGOGASTRODUODENOSCOPY (EGD) WITH PROPOFOL;  Surgeon: SManus Gunning MD;  [facility];  Service: Gastroenterology;  Laterality: N/A;  ? ESOPHAGOGASTRODUODENOSCOPY (EGD) WITH PROPOFOL N/A 07/11/2018  ? Procedure: ESOPHAGOGASTRODUODENOSCOPY (EGD) WITH PROPOFOL;  Surgeon: JMilus Banister MD;  [facility];  Service: Endoscopy;  Laterality: N/A;  ? IRRIGATION AND DEBRIDEMENT SEBACEOUS CYST  01-11-13  ? "off my back"  ? KYPHOPLASTY N/A 12/23/2017  ? Procedure: Lumbar One KYPHOPLASTY;  Surgeon: SErline Levine MD;  [facility]  Service: Neurosurgery;  Laterality: N/A;  Lumbar One KYPHOPLASTY  ? LEFT AND RIGHT HEART CATHETERIZATION WITH CORONARY ANGIOGRAM N/A 04/12/2014  ? Procedure: LEFT AND RIGHT HEART CATHETERIZATION WITH CORONARY ANGIOGRAM;  Surgeon: Peter M JMartinique MD;  [facility];  Service: Cardiovascular;  Laterality: N/A;  ? ORIF FEMUR FRACTURE Left 10-02-2010   dr oAlvan Dame ? subtrochanteric  ? POLYPECTOMY  07/11/2018  ? Procedure: POLYPECTOMY;  Surgeon: JMilus Banister MD;  [facility];  Service: Endoscopy;;  ? TONSILLECTOMY  1940's  ? TRANSTHORACIC ECHOCARDIOGRAM  05/01/2017  ? mild concentric LVH, ef 518-56% grade 1 diastolic dysfunction/ trivial AR/ mild dilated ascending aorta/ mild to moderate MR (no stenosis)/ mild LAE/ mild TR/ mild to moderate increase PASP 432mg  ? ? ?No Known Allergies ? ?Outpatient Encounter Medications as of 10/03/2021  ?Medication Sig  ? acetaminophen (TYLENOL) 325  MG tablet Take 650 mg by mouth every 6 (six) hours as needed.  ? allopurinol (ZYLOPRIM) 300 MG tablet Take 300 mg by mouth every morning.   ? Amino Acids-Protein Hydrolys (PRO-STAT SUGAR FREE PO) Take 30 mLs by mouth daily at 12 noon.  ? bisacodyl (DULCOLAX) 10 MG suppository Place 10 mg rectally as needed for moderate constipation.  ? DOXYCYCLINE HYCLATE PO Take 100 mg by mouth in the morning and at bedtime. FOR SCROTAL WOUND  ? furosemide (LASIX) 40 MG tablet Take 1 tablet (40 mg total) by mouth daily.  ? HYDROcodone-acetaminophen (NORCO) 5-325 MG tablet Take 1 tablet by mouth every 8 (eight) hours as needed for moderate pain. ADMINISTER 1 TABLET BY MOUTH EVERY 8 HOURS AS NEEDED FOR PAIN  ? HYDROcodone-acetaminophen (NORCO/VICODIN) 5-325 MG tablet Take 1 tablet by mouth daily.  ? levalbuterol (XOPENEX) 0.63 MG/3ML nebulizer solution Take 3 mLs (0.63 mg total) by  nebulization every 8 (eight) hours as needed for wheezing or shortness of breath.  ? Magnesium Hydroxide (MILK OF MAGNESIA PO) Take 30 mLs by mouth as needed.  ? Multiple Vitamin (MULTIVITAMIN WITH MINERALS) TABS tablet Take 1 tablet by mouth daily.  ? olmesartan (BENICAR) 20 MG tablet TAKE 1 TABLET(20 MG) BY MOUTH DAILY  ? omeprazole (PRILOSEC) 20 MG capsule Take 1 capsule (20 mg total) by mouth daily. Please keep your March Appointment for further refills. Thank you  ? OXYGEN Inhale 2 L into the lungs.  ? Sodium Phosphates (RA SALINE ENEMA RE) Place rectally as needed.  ? Thiamine HCl (VITAMIN B1) 100 MG TABS Take 1 tablet by mouth daily.  ? Vitamin D, Ergocalciferol, (DRISDOL) 1.25 MG (50000 UNIT) CAPS capsule Take 1 capsule (50,000 Units total) by mouth every 7 (seven) days.  ? XARELTO 20 MG TABS tablet TAKE 1 TABLET(20 MG) BY MOUTH DAILY WITH SUPPER  ? [DISCONTINUED] ferrous sulfate 325 (65 FE) MG tablet Take 1 tablet (325 mg total) by mouth daily for 30 days.  ? ?No facility-administered encounter medications on file as of 10/03/2021.  ? ? ?Review of  Systems  ?Constitutional:  Negative for activity change, appetite change and fever.  ?HENT:  Negative for sore throat.   ?Eyes: Negative.   ?Cardiovascular:  Negative for chest pain and leg swelling.  ?Gast

## 2021-10-05 ENCOUNTER — Encounter: Payer: Self-pay | Admitting: Adult Health

## 2021-10-05 ENCOUNTER — Non-Acute Institutional Stay (SKILLED_NURSING_FACILITY): Payer: PPO | Admitting: Adult Health

## 2021-10-05 DIAGNOSIS — J690 Pneumonitis due to inhalation of food and vomit: Secondary | ICD-10-CM

## 2021-10-05 DIAGNOSIS — K219 Gastro-esophageal reflux disease without esophagitis: Secondary | ICD-10-CM | POA: Diagnosis not present

## 2021-10-05 DIAGNOSIS — I5032 Chronic diastolic (congestive) heart failure: Secondary | ICD-10-CM

## 2021-10-05 DIAGNOSIS — Z86711 Personal history of pulmonary embolism: Secondary | ICD-10-CM

## 2021-10-05 DIAGNOSIS — I10 Essential (primary) hypertension: Secondary | ICD-10-CM | POA: Diagnosis not present

## 2021-10-05 DIAGNOSIS — R531 Weakness: Secondary | ICD-10-CM | POA: Diagnosis not present

## 2021-10-05 DIAGNOSIS — M109 Gout, unspecified: Secondary | ICD-10-CM

## 2021-10-05 DIAGNOSIS — N492 Inflammatory disorders of scrotum: Secondary | ICD-10-CM

## 2021-10-05 NOTE — Progress Notes (Addendum)
Location:  [facility] Room Number: 311-A Place of Service:  SNF (31) Provider:  Kenard Gower, DNP, FNP-BC  Patient Care Team: Merri Brunette, MD as PCP - General (Internal Medicine) Jens Som Madolyn Frieze, MD as PCP - Internal Medicine (Cardiology) [name], MD as PCP - Cardiology (Cardiology)  Extended Emergency Contact Information Primary Emergency Contact: Johanson,Gilda Address: [address] [place], [place] [address] [place] Home Phone: [phone] Mobile Phone: [phone] Relation: Spouse  Code Status:  Full Code  Goals of care: Advanced Directive information    10/03/2021   11:35 AM  Advanced Directives  Does Patient Have a Medical Advance Directive? No  Would patient like information on creating a medical advance directive? No - Patient declined     Chief Complaint  Patient presents with   Discharge Note    Discharge on 10/07/21    HPI:  Pt is a 81 y.o. male who is for discharge on 10/07/21 with Home health PT, OT and Nursing.  He was admitted to [facility] on  08/30/21 post hospital admission 08/24/21 to 08/30/21.  He has a PMH of hypertension, abdominal aortic aneurysm and Barrett's esophagus.  He presented to the hospital with fever and weakness.  He had been having multiple falls at home and had a fall out of the recliner on morning of hospital admission.  Wife was alarmed and called EMS.  There was no head injury or LOC.  Chest x-ray revealed bilateral atelectasis at the bases. He was started on Ceftriaxone and Azithromycin then transitioned to Augmentin. He developed volume overload consistent with acute diastolic heart failure so IV diuresis was started then transitioned to oral Lasix.  He had a lipoma on his left upper back and developed a sore on his back for not walking so much and sits around due to weakness and fear of falls. Lipoma was evaluated by  surgery but does not recommend removal of lipoma.  At [facility], he developed scrotal cellulitis with abscess for which he was started on Rocephin  1 gm IM daily  X 10 days then Doxycycline 100 mg BID X 2 weeks. He completed antibiotics and resolved cellulitis and pressure sore on his back.  Patient was admitted to this facility for short-term rehabilitation after the patient's recent hospitalization.  Patient has completed SNF rehabilitation and therapy has cleared the patient for discharge.   Past Medical History:  Diagnosis Date   AAA (abdominal aortic aneurysm) ([facility]) followed by dr Jens Som   last duplex 07-17-2017  3.2 x 3.3   Anticoagulant long-term use    xarelto for hx PEs and DVT   Arthritis    back   Barrett's esophagus 2009   Bilateral lower extremity edema    chronic pedal   CAD (coronary artery disease) cardioloigst--  dr Jens Som   cardiac cath 04-12-2014  single vessel obstructive cad involving ostrium D2, and other nonobstructive cad , normall lvf   Chronic lower back pain    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[facility])    per duplex 07-17-2017  left cia 2 x 2, right cia 2.1 x 2   Diverticulosis    Dyspnea on exertion    FH: colonic polyps    GERD (gastroesophageal reflux disease)    Gout    08-27-2017  per pt stable   History of adenomatous polyp of colon  History of DVT of lower extremity    10/ 2015  LLE   History of esophageal stricture    post dilation   History of gastric polyp    benign   History of pulmonary embolus (PE)    2005  treated 1 yr w/ coumadin and 10/ 2015  bilateral   History of radiation therapy 06/22/2012-08/18/2012   78 Gy to prostate (external beam)   History of subdural hematoma 03/2012   s/p  evacuation   Hypertension    Lesion of bladder    non canerous   Prostate cancer [facility]) urologist-  dr wrenn/ oncologist- dr Roselind Messier   dx 07/ 2013--  Stage T1c, Gleson 7, vol 67cc--- completed external beam radiation therapy 08-18-2012   Pulmonary HTN ([facility])  04/12/2014   last echo 05-01-2017  PASP   RBBB (right bundle branch block with left anterior fascicular block)    ocassional   Past Surgical History:  Procedure Laterality Date   BIOPSY  07/11/2018   Procedure: BIOPSY;  Surgeon: Rachael Fee, MD;  [facility];  Service: Endoscopy;;   CATARACT EXTRACTION W/ INTRAOCULAR LENS  IMPLANT, BILATERAL  2018   COLONOSCOPY WITH PROPOFOL N/A 04/25/2015   Procedure: COLONOSCOPY WITH PROPOFOL;  Surgeon: Ruffin Frederick, MD;  [facility];  Service: Gastroenterology;  Laterality: N/A;   COLONOSCOPY WITH PROPOFOL N/A 07/11/2018   Procedure: COLONOSCOPY WITH PROPOFOL;  Surgeon: Rachael Fee, MD;  [facility];  Service: Endoscopy;  Laterality: N/A;   CRANIOTOMY  04/03/2012   Procedure: CRANIOTOMY HEMATOMA EVACUATION SUBDURAL;  Surgeon: Maeola Harman, MD;  [facility];  Service: Neurosurgery;  Laterality: Left;  Left Craniotomy for Evacuation of Subdural Hematoma   CYSTOSCOPY WITH BIOPSY N/A 09/02/2017   Procedure: CYSTOSCOPY WITH BIOPSY AND FULGURATION POSSIBLE TRANSURETHRAL RESECTION OF BLADDER TUMOR;  Surgeon: Bjorn Pippin, MD;  Location: [facility];  Service: Urology;  Laterality: N/A;   ESOPHAGOGASTRODUODENOSCOPY (EGD) WITH PROPOFOL N/A 04/25/2015   Procedure: ESOPHAGOGASTRODUODENOSCOPY (EGD) WITH PROPOFOL;  Surgeon: Ruffin Frederick, MD;  [facility];  Service: Gastroenterology;  Laterality: N/A;   ESOPHAGOGASTRODUODENOSCOPY (EGD) WITH PROPOFOL N/A 07/11/2018   Procedure: ESOPHAGOGASTRODUODENOSCOPY (EGD) WITH PROPOFOL;  Surgeon: Rachael Fee, MD;  [facility];  Service: Endoscopy;  Laterality: N/A;   IRRIGATION AND DEBRIDEMENT SEBACEOUS CYST  01-11-13   "off my back"   KYPHOPLASTY N/A 12/23/2017   Procedure: Lumbar One KYPHOPLASTY;  Surgeon: Maeola Harman, MD;  [facility];  Service: Neurosurgery;  Laterality: N/A;  Lumbar One KYPHOPLASTY   LEFT AND  RIGHT HEART CATHETERIZATION WITH CORONARY ANGIOGRAM N/A 04/12/2014   Procedure: LEFT AND RIGHT HEART CATHETERIZATION WITH CORONARY ANGIOGRAM;  Surgeon: Peter M Swaziland, MD;  Location: [facility] LAB;  Service: Cardiovascular;  Laterality: N/A;   ORIF FEMUR FRACTURE Left 10-02-2010   dr Charlann Boxer   subtrochanteric   POLYPECTOMY  07/11/2018   Procedure: POLYPECTOMY;  Surgeon: Rachael Fee, MD;  [facility];  Service: Endoscopy;;   TONSILLECTOMY  1940's   TRANSTHORACIC ECHOCARDIOGRAM  05/01/2017   mild concentric LVH, ef 55-60%, grade 1 diastolic dysfunction/ trivial AR/ mild dilated ascending aorta/ mild to moderate MR (no stenosis)/ mild LAE/ mild TR/ mild to moderate increase PASP    No Known Allergies  Outpatient Encounter Medications as of 10/05/2021  Medication Sig   acetaminophen (TYLENOL) 325 MG tablet Take 650 mg by mouth every 6 (six) hours as needed.   Amino Acids-Protein Hydrolys (PRO-STAT  SUGAR FREE PO) Take 30 mLs by mouth daily at 12 noon.   bisacodyl (DULCOLAX) 10 MG suppository Place 10 mg rectally as needed for moderate constipation.   Magnesium Hydroxide (MILK OF MAGNESIA PO) Take 30 mLs by mouth as needed.   Multiple Vitamin (MULTIVITAMIN WITH MINERALS) TABS tablet Take 1 tablet by mouth daily.   OXYGEN Inhale 2 L into the lungs.   Sodium Phosphates (RA SALINE ENEMA RE) Place rectally as needed.   Thiamine HCl (VITAMIN B1) 100 MG TABS Take 1 tablet by mouth daily.   [DISCONTINUED] allopurinol (ZYLOPRIM) 300 MG tablet Take 300 mg by mouth every morning.    [DISCONTINUED] furosemide (LASIX) 40 MG tablet Take 1 tablet (40 mg total) by mouth daily.   [DISCONTINUED] HYDROcodone-acetaminophen (NORCO) 5-325 MG tablet Take 1 tablet by mouth every 8 (eight) hours as needed for moderate pain. ADMINISTER 1 TABLET BY MOUTH EVERY 8 HOURS AS NEEDED FOR PAIN   [DISCONTINUED] HYDROcodone-acetaminophen (NORCO/VICODIN) 5-325 MG tablet Take 1 tablet by mouth daily.    [DISCONTINUED] olmesartan (BENICAR) 20 MG tablet TAKE 1 TABLET(20 MG) BY MOUTH DAILY   [DISCONTINUED] omeprazole (PRILOSEC) 20 MG capsule Take 1 capsule (20 mg total) by mouth daily. Please keep your March Appointment for further refills. Thank you   [DISCONTINUED] Vitamin D, Ergocalciferol, (DRISDOL) 1.25 MG (50000 UNIT) CAPS capsule Take 1 capsule (50,000 Units total) by mouth every 7 (seven) days.   [DISCONTINUED] XARELTO 20 MG TABS tablet TAKE 1 TABLET(20 MG) BY MOUTH DAILY WITH SUPPER   allopurinol (ZYLOPRIM) 300 MG tablet Take 1 tablet (300 mg total) by mouth every morning.   furosemide (LASIX) 40 MG tablet Take 1 tablet (40 mg total) by mouth daily.   HYDROcodone-acetaminophen (NORCO/VICODIN) 5-325 MG tablet Take 1 tablet by mouth daily as needed for moderate pain.   levalbuterol (XOPENEX) 0.63 MG/3ML nebulizer solution Take 3 mLs (0.63 mg total) by nebulization every 8 (eight) hours as needed for wheezing or shortness of breath.   olmesartan (BENICAR) 20 MG tablet TAKE 1 TABLET(20 MG) BY MOUTH DAILY   omeprazole (PRILOSEC) 20 MG capsule Take 1 capsule (20 mg total) by mouth daily. Please keep your March Appointment for further refills. Thank you   rivaroxaban (XARELTO) 20 MG TABS tablet TAKE 1 TABLET(20 MG) BY MOUTH DAILY WITH SUPPER   Vitamin D, Ergocalciferol, (DRISDOL) 1.25 MG (50000 UNIT) CAPS capsule Take 1 capsule (50,000 Units total) by mouth every 7 (seven) days.   [DISCONTINUED] levalbuterol (XOPENEX) 0.63 MG/3ML nebulizer solution Take 3 mLs (0.63 mg total) by nebulization every 8 (eight) hours as needed for wheezing or shortness of breath.   No facility-administered encounter medications on file as of 10/05/2021.    Review of Systems  Constitutional:  Negative for activity change, appetite change and fever.  HENT:  Negative for sore throat.   Eyes: Negative.   Cardiovascular:  Positive for leg swelling. Negative for chest pain.  Gastrointestinal:  Negative for abdominal  distention, diarrhea and vomiting.  Genitourinary:  Negative for dysuria, frequency and urgency.  Skin:  Negative for color change.  Neurological:  Negative for dizziness and headaches.  Psychiatric/Behavioral:  Negative for behavioral problems and sleep disturbance. The patient is not nervous/anxious.       Immunization History  Administered Date(s) Administered   DTaP 10/15/2012   Influenza, High Dose Seasonal PF 04/07/2015, 04/15/2016   Influenza, Quadrivalent, Recombinant, Inj, Pf 02/27/2018, 02/10/2019, 03/10/2020, 03/02/2021   Influenza,inj,Quad PF,6+ Mos 04/23/2017   Influenza-Unspecified 03/19/2012, 04/09/2013, 02/22/2014, 04/28/2014  PFIZER(Purple Top)SARS-COV-2 Vaccination 07/06/2019, 07/26/2019, 03/28/2020   Pfizer Covid-19 Vaccine Bivalent Booster 41yrs & up 05/10/2021   Pneumococcal Conjugate-13 11/08/2013   Pneumococcal Polysaccharide-23 08/23/2003   Td 02/06/2006   Tdap 10/15/2012   Zoster, Live 02/04/2006   Pertinent  Health Maintenance Due  Topic Date Due   INFLUENZA VACCINE  01/22/2022   COLONOSCOPY (Pts 45-56yrs Insurance coverage will need to be confirmed)  07/12/2023      08/28/2021    9:43 AM 08/28/2021    9:55 PM 08/29/2021    9:00 AM 08/29/2021    9:40 PM 08/30/2021    7:45 AM  Fall Risk  Patient Fall Risk Level High fall risk High fall risk High fall risk High fall risk High fall risk     Vitals:   10/05/21 1508  BP: 115/66  Pulse: 80  Resp: 18  Temp: (!) 97.5 F (36.4 C)  SpO2: 99%  Weight: 201 lb (91.2 kg)  Height: 6' (1.829 m)   Body mass index is 27.26 kg/m.  Physical Exam Constitutional:      Appearance: Normal appearance.  HENT:     Head: Normocephalic and atraumatic.     Mouth/Throat:     Mouth: Mucous membranes are moist.  Eyes:     Conjunctiva/sclera: Conjunctivae normal.  Cardiovascular:     Rate and Rhythm: Normal rate and regular rhythm.     Pulses: Normal pulses.     Heart sounds: Normal heart sounds.  Pulmonary:      Effort: Pulmonary effort is normal.     Breath sounds: Normal breath sounds.  Abdominal:     General: Bowel sounds are normal.     Palpations: Abdomen is soft.  Musculoskeletal:        General: Swelling present. Normal range of motion.     Cervical back: Normal range of motion.     Right lower leg: Edema present.     Left lower leg: Edema present.     Comments: BLE 2+edema  Skin:    General: Skin is warm and dry.  Neurological:     General: No focal deficit present.     Mental Status: He is alert and oriented to person, place, and time.  Psychiatric:        Mood and Affect: Mood normal.        Behavior: Behavior normal.        Thought Content: Thought content normal.        Judgment: Judgment normal.      Labs reviewed: Recent Labs    08/26/21 0350 08/27/21 0354 08/28/21 0405 08/29/21 0402 08/29/21 0952 09/04/21 0000 09/11/21 0000  NA 130* 134* 133* 133*  --  130* 139  K 3.7 3.7 3.5 3.1*  --  3.8 4.4  CL 101 101 100 98  --  93* 101  CO2 24 25 26 27   --  27* 29*  GLUCOSE 122* 126* 119* 129*  --   --   --   BUN 24* 20 24* 19  --  29* 33*  CREATININE 0.80 0.64 0.74 0.62  --  0.7 0.8  CALCIUM 7.2* 7.3* 7.1* 7.3*  --  7.7* 8.5*  MG 1.6* 2.1  --  2.1  --   --   --   PHOS  --   --   --   --  2.6  --   --    Recent Labs    08/24/21 0800 08/25/21 0149  AST 36 24  ALT 15  16  ALKPHOS 47 38  BILITOT 1.8* 0.5  PROT 6.9 5.2*  ALBUMIN 3.4* 2.6*   Recent Labs    08/24/21 0800 08/25/21 0149 08/26/21 0350 08/27/21 0354 08/28/21 0405 09/04/21 0000 09/11/21 0000  WBC 14.0*   < > 10.9* 11.6* 10.4 15.3 5.8  NEUTROABS 12.0*  --  9.9* 9.8*  --   --   --   HGB 12.4*   < > 11.1* 11.1* 10.5* 10.0* 11.1*  HCT 37.0*   < > 33.6* 33.3* 31.6* 31* 32*  MCV 100.0   < > 99.1 99.1 100.3*  --   --   PLT 159   < > 151 181 214 470* 420*   < > = values in this interval not displayed.   Lab Results  Component Value Date   TSH 2.056 08/01/2014   No results found for: HGBA1C No  results found for: CHOL, HDL, LDLCALC, LDLDIRECT, TRIG, CHOLHDL  Significant Diagnostic Results in last 30 days:  No results found.  Assessment/Plan  1. Aspiration pneumonia of right lower lobe, unspecified aspiration pneumonia type (HCC) -   completed antibiotics, resolved - levalbuterol (XOPENEX) 0.63 MG/3ML nebulizer solution; Take 3 mLs (0.63 mg total) by nebulization every 8 (eight) hours as needed for wheezing or shortness of breath.  Dispense: 3 mL; Refill: 12  2. Essential hypertension - olmesartan (BENICAR) 20 MG tablet; TAKE 1 TABLET(20 MG) BY MOUTH DAILY  Dispense: 30 tablet; Refill: 0  3. Chronic diastolic congestive heart failure (HCC) - furosemide (LASIX) 40 MG tablet; Take 1 tablet (40 mg total) by mouth daily.  Dispense: 30 tablet; Refill: 0  4. History of pulmonary embolus (PE) - rivaroxaban (XARELTO) 20 MG TABS tablet; TAKE 1 TABLET(20 MG) BY MOUTH DAILY WITH SUPPER  Dispense: 30 tablet; Refill: 0  5. Gastroesophageal reflux disease without esophagitis - omeprazole (PRILOSEC) 20 MG capsule; Take 1 capsule (20 mg total) by mouth daily. Please keep your March Appointment for further refills. Thank you  Dispense: 30 capsule; Refill: 0  6. Cellulitis of scrotum -  completed antibiotics, for home health nurse for wound care follow-up - HYDROcodone-acetaminophen (NORCO/VICODIN) 5-325 MG tablet; Take 1 tablet by mouth daily as needed for moderate pain.  Dispense: 30 tablet; Refill: 0  7. Gout, unspecified cause, unspecified chronicity, unspecified site - allopurinol (ZYLOPRIM) 300 MG tablet; Take 1 tablet (300 mg total) by mouth every morning.  Dispense: 30 tablet; Refill: 0  8. Generalized weakness -  for Home health PT and OT, for therapeutic and strengthening exercises     I have filled out patient's discharge paperwork and e-prescribed medications.  Patient will have home health PT, OT and Nurse.  DME provided:    walker and 3-in-1  Total discharge time: Greater  than 30 minutes Greater than 50% was spent in counseling and coordination of care.   Discharge time involved coordination of the discharge process with social worker, nursing staff and therapy department. Medical justification for home health services/DME verified.      Kenard Gower, DNP, MSN, FNP-BC [facility] [phone] (Monday-Friday 8:00 a.m. - 5:00 p.m.) [phone] (after hours)

## 2021-10-06 MED ORDER — VITAMIN D (ERGOCALCIFEROL) 1.25 MG (50000 UNIT) PO CAPS: 50000.0000 [IU] | ORAL_CAPSULE | ORAL | 0 refills | Status: DC

## 2021-10-06 MED ORDER — RIVAROXABAN 20 MG PO TABS: ORAL_TABLET | ORAL | 0 refills | Status: DC

## 2021-10-06 MED ORDER — OMEPRAZOLE 20 MG PO CPDR: 20.0000 mg | DELAYED_RELEASE_CAPSULE | Freq: Every day | ORAL | 0 refills | Status: DC

## 2021-10-06 MED ORDER — LEVALBUTEROL HCL 0.63 MG/3ML IN NEBU: 0.6300 mg | INHALATION_SOLUTION | Freq: Three times a day (TID) | RESPIRATORY_TRACT | 12 refills | Status: DC | PRN

## 2021-10-06 MED ORDER — HYDROCODONE-ACETAMINOPHEN 5-325 MG PO TABS: 1.0000 | ORAL_TABLET | Freq: Every day | ORAL | 0 refills | Status: DC | PRN

## 2021-10-06 MED ORDER — FUROSEMIDE 40 MG PO TABS: 40.0000 mg | ORAL_TABLET | Freq: Every day | ORAL | 0 refills | Status: DC

## 2021-10-06 MED ORDER — OLMESARTAN MEDOXOMIL 20 MG PO TABS: ORAL_TABLET | ORAL | 0 refills | Status: DC

## 2021-10-06 MED ORDER — ALLOPURINOL 300 MG PO TABS: 300.0000 mg | ORAL_TABLET | Freq: Every morning | ORAL | 0 refills | Status: DC

## 2021-10-08 DIAGNOSIS — M6281 Muscle weakness (generalized): Secondary | ICD-10-CM | POA: Diagnosis not present

## 2021-10-08 DIAGNOSIS — R2681 Unsteadiness on feet: Secondary | ICD-10-CM | POA: Diagnosis not present

## 2021-10-30 DIAGNOSIS — R5381 Other malaise: Secondary | ICD-10-CM | POA: Diagnosis not present

## 2021-10-30 DIAGNOSIS — E778 Other disorders of glycoprotein metabolism: Secondary | ICD-10-CM | POA: Diagnosis not present

## 2021-10-30 DIAGNOSIS — I10 Essential (primary) hypertension: Secondary | ICD-10-CM | POA: Diagnosis not present

## 2021-10-30 DIAGNOSIS — R600 Localized edema: Secondary | ICD-10-CM | POA: Diagnosis not present

## 2021-11-06 DIAGNOSIS — I714 Abdominal aortic aneurysm, without rupture, unspecified: Secondary | ICD-10-CM | POA: Diagnosis not present

## 2021-11-06 DIAGNOSIS — M170 Bilateral primary osteoarthritis of knee: Secondary | ICD-10-CM | POA: Diagnosis not present

## 2021-11-06 DIAGNOSIS — R7303 Prediabetes: Secondary | ICD-10-CM | POA: Diagnosis not present

## 2021-11-06 DIAGNOSIS — D509 Iron deficiency anemia, unspecified: Secondary | ICD-10-CM | POA: Diagnosis not present

## 2021-11-06 DIAGNOSIS — I2510 Atherosclerotic heart disease of native coronary artery without angina pectoris: Secondary | ICD-10-CM | POA: Diagnosis not present

## 2021-11-06 DIAGNOSIS — Z8701 Personal history of pneumonia (recurrent): Secondary | ICD-10-CM | POA: Diagnosis not present

## 2021-11-06 DIAGNOSIS — K2270 Barrett's esophagus without dysplasia: Secondary | ICD-10-CM | POA: Diagnosis not present

## 2021-11-06 DIAGNOSIS — J439 Emphysema, unspecified: Secondary | ICD-10-CM | POA: Diagnosis not present

## 2021-11-06 DIAGNOSIS — M545 Low back pain, unspecified: Secondary | ICD-10-CM | POA: Diagnosis not present

## 2021-11-06 DIAGNOSIS — E782 Mixed hyperlipidemia: Secondary | ICD-10-CM | POA: Diagnosis not present

## 2021-11-06 DIAGNOSIS — Z87891 Personal history of nicotine dependence: Secondary | ICD-10-CM | POA: Diagnosis not present

## 2021-11-06 DIAGNOSIS — M1A9XX0 Chronic gout, unspecified, without tophus (tophi): Secondary | ICD-10-CM | POA: Diagnosis not present

## 2021-11-06 DIAGNOSIS — I452 Bifascicular block: Secondary | ICD-10-CM | POA: Diagnosis not present

## 2021-11-06 DIAGNOSIS — Z86711 Personal history of pulmonary embolism: Secondary | ICD-10-CM | POA: Diagnosis not present

## 2021-11-06 DIAGNOSIS — K219 Gastro-esophageal reflux disease without esophagitis: Secondary | ICD-10-CM | POA: Diagnosis not present

## 2021-11-06 DIAGNOSIS — Z8546 Personal history of malignant neoplasm of prostate: Secondary | ICD-10-CM | POA: Diagnosis not present

## 2021-11-06 DIAGNOSIS — Z86010 Personal history of colonic polyps: Secondary | ICD-10-CM | POA: Diagnosis not present

## 2021-11-06 DIAGNOSIS — Z6824 Body mass index (BMI) 24.0-24.9, adult: Secondary | ICD-10-CM | POA: Diagnosis not present

## 2021-11-06 DIAGNOSIS — Z9181 History of falling: Secondary | ICD-10-CM | POA: Diagnosis not present

## 2021-11-06 DIAGNOSIS — E778 Other disorders of glycoprotein metabolism: Secondary | ICD-10-CM | POA: Diagnosis not present

## 2021-11-06 DIAGNOSIS — D72819 Decreased white blood cell count, unspecified: Secondary | ICD-10-CM | POA: Diagnosis not present

## 2021-11-06 DIAGNOSIS — Z7901 Long term (current) use of anticoagulants: Secondary | ICD-10-CM | POA: Diagnosis not present

## 2021-11-14 DIAGNOSIS — R2681 Unsteadiness on feet: Secondary | ICD-10-CM | POA: Diagnosis not present

## 2021-11-14 DIAGNOSIS — M6281 Muscle weakness (generalized): Secondary | ICD-10-CM | POA: Diagnosis not present

## 2021-11-21 DIAGNOSIS — Z86010 Personal history of colonic polyps: Secondary | ICD-10-CM | POA: Diagnosis not present

## 2021-11-21 DIAGNOSIS — M1A9XX0 Chronic gout, unspecified, without tophus (tophi): Secondary | ICD-10-CM | POA: Diagnosis not present

## 2021-11-21 DIAGNOSIS — I452 Bifascicular block: Secondary | ICD-10-CM | POA: Diagnosis not present

## 2021-11-21 DIAGNOSIS — M545 Low back pain, unspecified: Secondary | ICD-10-CM | POA: Diagnosis not present

## 2021-11-21 DIAGNOSIS — E778 Other disorders of glycoprotein metabolism: Secondary | ICD-10-CM | POA: Diagnosis not present

## 2021-11-21 DIAGNOSIS — I714 Abdominal aortic aneurysm, without rupture, unspecified: Secondary | ICD-10-CM | POA: Diagnosis not present

## 2021-11-21 DIAGNOSIS — M170 Bilateral primary osteoarthritis of knee: Secondary | ICD-10-CM | POA: Diagnosis not present

## 2021-11-21 DIAGNOSIS — D509 Iron deficiency anemia, unspecified: Secondary | ICD-10-CM | POA: Diagnosis not present

## 2021-11-21 DIAGNOSIS — K2270 Barrett's esophagus without dysplasia: Secondary | ICD-10-CM | POA: Diagnosis not present

## 2021-11-21 DIAGNOSIS — Z9181 History of falling: Secondary | ICD-10-CM | POA: Diagnosis not present

## 2021-11-21 DIAGNOSIS — J439 Emphysema, unspecified: Secondary | ICD-10-CM | POA: Diagnosis not present

## 2021-11-21 DIAGNOSIS — Z86711 Personal history of pulmonary embolism: Secondary | ICD-10-CM | POA: Diagnosis not present

## 2021-11-21 DIAGNOSIS — K219 Gastro-esophageal reflux disease without esophagitis: Secondary | ICD-10-CM | POA: Diagnosis not present

## 2021-11-21 DIAGNOSIS — Z87891 Personal history of nicotine dependence: Secondary | ICD-10-CM | POA: Diagnosis not present

## 2021-11-21 DIAGNOSIS — R7303 Prediabetes: Secondary | ICD-10-CM | POA: Diagnosis not present

## 2021-11-21 DIAGNOSIS — Z6824 Body mass index (BMI) 24.0-24.9, adult: Secondary | ICD-10-CM | POA: Diagnosis not present

## 2021-11-21 DIAGNOSIS — Z7901 Long term (current) use of anticoagulants: Secondary | ICD-10-CM | POA: Diagnosis not present

## 2021-11-21 DIAGNOSIS — Z8546 Personal history of malignant neoplasm of prostate: Secondary | ICD-10-CM | POA: Diagnosis not present

## 2021-11-21 DIAGNOSIS — E782 Mixed hyperlipidemia: Secondary | ICD-10-CM | POA: Diagnosis not present

## 2021-11-21 DIAGNOSIS — D72819 Decreased white blood cell count, unspecified: Secondary | ICD-10-CM | POA: Diagnosis not present

## 2021-11-21 DIAGNOSIS — Z8701 Personal history of pneumonia (recurrent): Secondary | ICD-10-CM | POA: Diagnosis not present

## 2021-11-21 DIAGNOSIS — I2510 Atherosclerotic heart disease of native coronary artery without angina pectoris: Secondary | ICD-10-CM | POA: Diagnosis not present

## 2021-11-23 DIAGNOSIS — D509 Iron deficiency anemia, unspecified: Secondary | ICD-10-CM | POA: Diagnosis not present

## 2021-11-23 DIAGNOSIS — Z8701 Personal history of pneumonia (recurrent): Secondary | ICD-10-CM | POA: Diagnosis not present

## 2021-11-23 DIAGNOSIS — I2510 Atherosclerotic heart disease of native coronary artery without angina pectoris: Secondary | ICD-10-CM | POA: Diagnosis not present

## 2021-11-23 DIAGNOSIS — Z9181 History of falling: Secondary | ICD-10-CM | POA: Diagnosis not present

## 2021-11-23 DIAGNOSIS — I452 Bifascicular block: Secondary | ICD-10-CM | POA: Diagnosis not present

## 2021-11-23 DIAGNOSIS — E782 Mixed hyperlipidemia: Secondary | ICD-10-CM | POA: Diagnosis not present

## 2021-11-23 DIAGNOSIS — M1A9XX0 Chronic gout, unspecified, without tophus (tophi): Secondary | ICD-10-CM | POA: Diagnosis not present

## 2021-11-23 DIAGNOSIS — J439 Emphysema, unspecified: Secondary | ICD-10-CM | POA: Diagnosis not present

## 2021-11-23 DIAGNOSIS — Z8546 Personal history of malignant neoplasm of prostate: Secondary | ICD-10-CM | POA: Diagnosis not present

## 2021-11-23 DIAGNOSIS — Z87891 Personal history of nicotine dependence: Secondary | ICD-10-CM | POA: Diagnosis not present

## 2021-11-23 DIAGNOSIS — E778 Other disorders of glycoprotein metabolism: Secondary | ICD-10-CM | POA: Diagnosis not present

## 2021-11-23 DIAGNOSIS — M170 Bilateral primary osteoarthritis of knee: Secondary | ICD-10-CM | POA: Diagnosis not present

## 2021-11-23 DIAGNOSIS — Z7901 Long term (current) use of anticoagulants: Secondary | ICD-10-CM | POA: Diagnosis not present

## 2021-11-23 DIAGNOSIS — K219 Gastro-esophageal reflux disease without esophagitis: Secondary | ICD-10-CM | POA: Diagnosis not present

## 2021-11-23 DIAGNOSIS — R7303 Prediabetes: Secondary | ICD-10-CM | POA: Diagnosis not present

## 2021-11-23 DIAGNOSIS — K2270 Barrett's esophagus without dysplasia: Secondary | ICD-10-CM | POA: Diagnosis not present

## 2021-11-23 DIAGNOSIS — I714 Abdominal aortic aneurysm, without rupture, unspecified: Secondary | ICD-10-CM | POA: Diagnosis not present

## 2021-11-23 DIAGNOSIS — Z86711 Personal history of pulmonary embolism: Secondary | ICD-10-CM | POA: Diagnosis not present

## 2021-11-23 DIAGNOSIS — Z6824 Body mass index (BMI) 24.0-24.9, adult: Secondary | ICD-10-CM | POA: Diagnosis not present

## 2021-11-23 DIAGNOSIS — Z86010 Personal history of colonic polyps: Secondary | ICD-10-CM | POA: Diagnosis not present

## 2021-11-23 DIAGNOSIS — M545 Low back pain, unspecified: Secondary | ICD-10-CM | POA: Diagnosis not present

## 2021-11-23 DIAGNOSIS — D72819 Decreased white blood cell count, unspecified: Secondary | ICD-10-CM | POA: Diagnosis not present

## 2021-11-26 DIAGNOSIS — I2510 Atherosclerotic heart disease of native coronary artery without angina pectoris: Secondary | ICD-10-CM | POA: Diagnosis not present

## 2021-11-26 DIAGNOSIS — M170 Bilateral primary osteoarthritis of knee: Secondary | ICD-10-CM | POA: Diagnosis not present

## 2021-11-26 DIAGNOSIS — J439 Emphysema, unspecified: Secondary | ICD-10-CM | POA: Diagnosis not present

## 2021-12-27 DIAGNOSIS — Z9181 History of falling: Secondary | ICD-10-CM | POA: Diagnosis not present

## 2021-12-27 DIAGNOSIS — I2510 Atherosclerotic heart disease of native coronary artery without angina pectoris: Secondary | ICD-10-CM | POA: Diagnosis not present

## 2021-12-27 DIAGNOSIS — Z86010 Personal history of colonic polyps: Secondary | ICD-10-CM | POA: Diagnosis not present

## 2021-12-27 DIAGNOSIS — Z8546 Personal history of malignant neoplasm of prostate: Secondary | ICD-10-CM | POA: Diagnosis not present

## 2021-12-27 DIAGNOSIS — Z86711 Personal history of pulmonary embolism: Secondary | ICD-10-CM | POA: Diagnosis not present

## 2021-12-27 DIAGNOSIS — D72819 Decreased white blood cell count, unspecified: Secondary | ICD-10-CM | POA: Diagnosis not present

## 2021-12-27 DIAGNOSIS — M1A9XX0 Chronic gout, unspecified, without tophus (tophi): Secondary | ICD-10-CM | POA: Diagnosis not present

## 2021-12-27 DIAGNOSIS — Z8701 Personal history of pneumonia (recurrent): Secondary | ICD-10-CM | POA: Diagnosis not present

## 2021-12-27 DIAGNOSIS — K2270 Barrett's esophagus without dysplasia: Secondary | ICD-10-CM | POA: Diagnosis not present

## 2021-12-27 DIAGNOSIS — Z7901 Long term (current) use of anticoagulants: Secondary | ICD-10-CM | POA: Diagnosis not present

## 2021-12-27 DIAGNOSIS — Z87891 Personal history of nicotine dependence: Secondary | ICD-10-CM | POA: Diagnosis not present

## 2021-12-27 DIAGNOSIS — E778 Other disorders of glycoprotein metabolism: Secondary | ICD-10-CM | POA: Diagnosis not present

## 2021-12-27 DIAGNOSIS — Z6824 Body mass index (BMI) 24.0-24.9, adult: Secondary | ICD-10-CM | POA: Diagnosis not present

## 2021-12-27 DIAGNOSIS — J439 Emphysema, unspecified: Secondary | ICD-10-CM | POA: Diagnosis not present

## 2021-12-27 DIAGNOSIS — I714 Abdominal aortic aneurysm, without rupture, unspecified: Secondary | ICD-10-CM | POA: Diagnosis not present

## 2021-12-27 DIAGNOSIS — R7303 Prediabetes: Secondary | ICD-10-CM | POA: Diagnosis not present

## 2021-12-27 DIAGNOSIS — E782 Mixed hyperlipidemia: Secondary | ICD-10-CM | POA: Diagnosis not present

## 2021-12-27 DIAGNOSIS — I452 Bifascicular block: Secondary | ICD-10-CM | POA: Diagnosis not present

## 2021-12-27 DIAGNOSIS — M170 Bilateral primary osteoarthritis of knee: Secondary | ICD-10-CM | POA: Diagnosis not present

## 2021-12-27 DIAGNOSIS — M545 Low back pain, unspecified: Secondary | ICD-10-CM | POA: Diagnosis not present

## 2021-12-27 DIAGNOSIS — D509 Iron deficiency anemia, unspecified: Secondary | ICD-10-CM | POA: Diagnosis not present

## 2021-12-27 DIAGNOSIS — K219 Gastro-esophageal reflux disease without esophagitis: Secondary | ICD-10-CM | POA: Diagnosis not present

## 2022-01-02 ENCOUNTER — Other Ambulatory Visit: Payer: Self-pay | Admitting: Adult Health

## 2022-01-02 DIAGNOSIS — Z86711 Personal history of pulmonary embolism: Secondary | ICD-10-CM

## 2022-01-21 ENCOUNTER — Telehealth: Payer: Self-pay | Admitting: Gastroenterology

## 2022-01-21 NOTE — Telephone Encounter (Signed)
Returned call to patient. I spoke with his wife, Bethanne Ginger. Pt has been scheduled for a follow up appt with Dr. Havery Moros on Wednesday, 01/23/22 at 3:40 pm. Pt's wife verbalized understanding and had no concerns at the end of the call.

## 2022-01-23 ENCOUNTER — Encounter: Payer: Self-pay | Admitting: Gastroenterology

## 2022-01-23 ENCOUNTER — Ambulatory Visit: Payer: PPO | Admitting: Gastroenterology

## 2022-01-23 ENCOUNTER — Other Ambulatory Visit (INDEPENDENT_AMBULATORY_CARE_PROVIDER_SITE_OTHER): Payer: PPO

## 2022-01-23 VITALS — BP 80/50 | HR 100 | Ht 72.0 in | Wt 193.0 lb

## 2022-01-23 DIAGNOSIS — K625 Hemorrhage of anus and rectum: Secondary | ICD-10-CM

## 2022-01-23 DIAGNOSIS — K2270 Barrett's esophagus without dysplasia: Secondary | ICD-10-CM

## 2022-01-23 DIAGNOSIS — R197 Diarrhea, unspecified: Secondary | ICD-10-CM

## 2022-01-23 DIAGNOSIS — K627 Radiation proctitis: Secondary | ICD-10-CM

## 2022-01-23 DIAGNOSIS — Z7901 Long term (current) use of anticoagulants: Secondary | ICD-10-CM | POA: Diagnosis not present

## 2022-01-23 DIAGNOSIS — Z86010 Personal history of colonic polyps: Secondary | ICD-10-CM | POA: Diagnosis not present

## 2022-01-23 LAB — CBC WITH DIFFERENTIAL/PLATELET
Basophils Absolute: 0.1 10*3/uL (ref 0.0–0.1)
Basophils Relative: 1.1 % (ref 0.0–3.0)
Eosinophils Absolute: 0.1 10*3/uL (ref 0.0–0.7)
Eosinophils Relative: 2.6 % (ref 0.0–5.0)
HCT: 27.9 % — ABNORMAL LOW (ref 39.0–52.0)
Hemoglobin: 9.2 g/dL — ABNORMAL LOW (ref 13.0–17.0)
Lymphocytes Relative: 22.1 % (ref 12.0–46.0)
Lymphs Abs: 1.1 10*3/uL (ref 0.7–4.0)
MCHC: 33.1 g/dL (ref 30.0–36.0)
MCV: 98.3 fl (ref 78.0–100.0)
Monocytes Absolute: 0.5 10*3/uL (ref 0.1–1.0)
Monocytes Relative: 9.8 % (ref 3.0–12.0)
Neutro Abs: 3.2 10*3/uL (ref 1.4–7.7)
Neutrophils Relative %: 64.4 % (ref 43.0–77.0)
Platelets: 187 10*3/uL (ref 150.0–400.0)
RBC: 2.84 Mil/uL — ABNORMAL LOW (ref 4.22–5.81)
RDW: 15.9 % — ABNORMAL HIGH (ref 11.5–15.5)
WBC: 4.9 10*3/uL (ref 4.0–10.5)

## 2022-01-23 LAB — IBC + FERRITIN
Ferritin: 82.3 ng/mL (ref 22.0–322.0)
Iron: 46 ug/dL (ref 42–165)
Saturation Ratios: 13.9 % — ABNORMAL LOW (ref 20.0–50.0)
TIBC: 330.4 ug/dL (ref 250.0–450.0)
Transferrin: 236 mg/dL (ref 212.0–360.0)

## 2022-01-23 MED ORDER — AMBULATORY NON FORMULARY MEDICATION: 2 refills | Status: DC

## 2022-01-23 NOTE — Progress Notes (Addendum)
HPI :  81 year old male here for a follow-up visit for rectal bleeding which is a new issue for him.  I have not seen him since August 2020.  Recall he has a history of Barrett's esophagus and multiple colonic adenomas, iron deficiency anemia, recurrent C Diff. He has a history of pulmonary embolism and remains on Xarelto.   Recall he was admitted to the hospital for symptomatic anemia in 2020.  He had an EGD and colonoscopy at that time.  EGD showed 4 cm segment of Barrett's esophagus but no cause for anemia.  He had radiation proctitis in his colon, residual polyp in the right colon from prior EMR that was removed, and internal hemorrhoids.  His blood counts had improved over time.  Radiation proctitis was not bleeding at the time, was not treated endoscopically.  I have not seen him in almost 3 years.  Unfortunately he was admitted in the hospital last March with pneumonia causing sepsis complicated by diastolic heart failure.  He was treated with antibiotics, started on Lasix, transitioned to rehab where he was there for some time and eventually home with home health.  He states has been pretty weak and recovering from his hospitalization for the past several months.   He presents today for rectal bleeding, he states this is been going on for about a week and a half.  He denies any constipation, has been having actually loose watery stools for the past week and a half with the bleeding with he thinks every BM.  Hard for him to quantify the amount of blood he has had, red blood he has seen this blood in the toilet and and his underwear, he has a bowel movement every time he urinates.  He does think over the past few days the volume and frequency of bleeding has reduced compared to how it was before.  He has not had any incontinence that he endorses.  He has not been on any recent antibiotics.  He denies any rectal pain.  Denies any hemorrhoids that are bothering him.  He denies any abdominal pain.  He  is slow to ambulate, uses a walker, has baseline mild dyspnea, states this is been going on since his hospitalization.  He denies any chest pain.  He is on Xarelto for history of PE.  He takes his omeprazole daily.  He denies any reflux, no heartburn, is eating well.  Denies any dysphagia.  No nausea or vomiting.  No NSAID use.  We had remotely discussed options in the past and discussed small bowel evaluation given we did not find a clear cause on his EGD and colonoscopy. He initially was agreeable to it however did not schedule and hoped to avoid any further workup if possible.    Endoscopic history: EGD 04/25/2015 - Evidence of Barrett's esophagus was noted at the distal esophagus (C2M3) - no dysplasia, 7cm hiatal hernia, multiple benign gastric polyps. Recall 04/2018 Colonoscopy 04/25/2015 - severe diverticulosis, 3-4cm polyp in ascending colon not removed, another 1cm polyp removed, 51m sigmoid polyp, radiation proctitis s/p APC. Polyps c/w adenoma. Referred to Dr. GArsenio Loaderfor polypectomy [facility]  EGD 07/11/18 - Prague classification C4M4 Barrett's esophagus was noted in the distal esophagus. The EG landmarks were not clear given the large hiatal hernia however it looks like the EG junction is at 32cm from the incisors and the proximal edge of the Barrett's mucosa is at 36cm from the incisors. No cameron lesions  Colonoscopy 07/11/18 - 1.5cm area of residual polyp, removed piecemeal via EMR, radiation proctitis, medium hemorrhoids, diverticulosis     Echo 08/29/2021 - EF 55-60%, mild LVH,      Past Medical History:  Diagnosis Date   AAA (abdominal aortic aneurysm) ([place]) followed by dr Stanford Breed   last duplex 07-17-2017  3.2 x 3.3   Anticoagulant long-term use    xarelto for hx PEs and DVT   Arthritis    back   Barrett's esophagus 2009   Bilateral lower extremity edema    chronic pedal   CAD (coronary artery disease) cardioloigst--  dr Stanford Breed   cardiac  cath 04-12-2014  single vessel obstructive cad involving ostrium D2, and other nonobstructive cad , normall lvf   Chronic lower back pain    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[place])    per duplex 07-17-2017  left cia 2 x 2, right cia 2.1 x 2   Diverticulosis    Dyspnea on exertion    FH: colonic polyps    GERD (gastroesophageal reflux disease)    Gout    08-27-2017  per pt stable   History of adenomatous polyp of colon    History of DVT of lower extremity    10/ 2015  LLE   History of esophageal stricture    post dilation   History of gastric polyp    benign   History of pulmonary embolus (PE)    2005  treated 1 yr w/ coumadin and 10/ 2015  bilateral   History of radiation therapy 06/22/2012-08/18/2012   78 Gy to prostate (external beam)   History of subdural hematoma 03/2012   s/p  evacuation   Hypertension    Lesion of bladder    non canerous   Prostate cancer [facility]) urologist-  dr wrenn/ oncologist- dr Sondra Come   dx 07/ 2013--  Stage T1c, [place] 7, vol 67cc--- completed external beam radiation therapy 08-18-2012   Pulmonary HTN ([place]) 04/12/2014   last echo 05-01-2017  PASP 50mHg   RBBB (right bundle branch block with left anterior fascicular block)    ocassional     Past Surgical History:  Procedure Laterality Date   BIOPSY  07/11/2018   Procedure: BIOPSY;  Surgeon: JMilus Banister MD;  [facility]: [facility];  Service: Endoscopy;;   CATARACT EXTRACTION W/ INTRAOCULAR LENS  IMPLANT, BILATERAL  2018   COLONOSCOPY WITH PROPOFOL N/A 04/25/2015   Procedure: COLONOSCOPY WITH PROPOFOL;  Surgeon: SManus Gunning MD;  [facility];  Service: Gastroenterology;  Laterality: N/A;   COLONOSCOPY WITH PROPOFOL N/A 07/11/2018   Procedure: COLONOSCOPY WITH PROPOFOL;  Surgeon: JMilus Banister MD;  [facility];  Service: Endoscopy;  Laterality: N/A;   CRANIOTOMY  04/03/2012   Procedure: CRANIOTOMY HEMATOMA EVACUATION SUBDURAL;  Surgeon: JErline Levine MD;  [facility];  Service: Neurosurgery;  Laterality: Left;  Left Craniotomy for Evacuation of Subdural Hematoma   CYSTOSCOPY WITH BIOPSY N/A 09/02/2017   Procedure: CYSTOSCOPY WITH BIOPSY AND FULGURATION POSSIBLE TRANSURETHRAL RESECTION OF BLADDER TUMOR;  Surgeon: WIrine Seal MD;  [facility]  Service: Urology;  Laterality: N/A;   ESOPHAGOGASTRODUODENOSCOPY (EGD) WITH PROPOFOL N/A 04/25/2015   Procedure: ESOPHAGOGASTRODUODENOSCOPY (EGD) WITH PROPOFOL;  Surgeon: SManus Gunning MD;  [facility];  Service: Gastroenterology;  Laterality: N/A;   ESOPHAGOGASTRODUODENOSCOPY (EGD) WITH PROPOFOL N/A 07/11/2018   Procedure: ESOPHAGOGASTRODUODENOSCOPY (EGD) WITH PROPOFOL;  Surgeon: JMilus Banister MD;  [facility];  Service: Endoscopy;  Laterality: N/A;  IRRIGATION AND DEBRIDEMENT SEBACEOUS CYST  01-11-13   "off my back"   KYPHOPLASTY N/A 12/23/2017   Procedure: Lumbar One KYPHOPLASTY;  Surgeon: Erline Levine, MD;  Location: [facility];  Service: Neurosurgery;  Laterality: N/A;  Lumbar One KYPHOPLASTY   LEFT AND RIGHT HEART CATHETERIZATION WITH CORONARY ANGIOGRAM N/A 04/12/2014   Procedure: LEFT AND RIGHT HEART CATHETERIZATION WITH CORONARY ANGIOGRAM;  Surgeon: Peter M Martinique, MD;  [facility] LAB;  Service: Cardiovascular;  Laterality: N/A;   ORIF FEMUR FRACTURE Left 10-02-2010   dr Alvan Dame   subtrochanteric   POLYPECTOMY  07/11/2018   Procedure: POLYPECTOMY;  Surgeon: Milus Banister, MD;  [facility];  Service: Endoscopy;;   TONSILLECTOMY  1940's   TRANSTHORACIC ECHOCARDIOGRAM  05/01/2017   mild concentric LVH, ef 50-53%, grade 1 diastolic dysfunction/ trivial AR/ mild dilated ascending aorta/ mild to moderate MR (no stenosis)/ mild LAE/ mild TR/ mild to moderate increase PASP 29mHg   Family History  Problem Relation Age of Onset   Macular degeneration Mother    Osteoarthritis Mother    Aneurysm Father        d/o 680yo, ruptured AAA    Heart disease Other        No family history   Colon cancer Neg Hx    Colon polyps Neg Hx    Diabetes Neg Hx    Social History   Tobacco Use   Smoking status: Former    Packs/day: 1.00    Years: 20.00    Total pack years: 20.00    Types: Cigarettes    Quit date: 12/23/1971    Years since quitting: 50.1   Smokeless tobacco: Never  Vaping Use   Vaping Use: Never used  Substance Use Topics   Alcohol use: Yes    Alcohol/week: 7.0 standard drinks of alcohol    Types: 7 Cans of beer per week    Comment: 1 beer daily   Drug use: No   Current Outpatient Medications  Medication Sig Dispense Refill   allopurinol (ZYLOPRIM) 300 MG tablet Take 1 tablet (300 mg total) by mouth every morning. 30 tablet 0   AMBULATORY NON FORMULARY MEDICATION Carafate suppository 85 mg: Insert rectally twice daily 60 suppository 2   ferrous sulfate 325 (65 FE) MG tablet Take 325 mg by mouth daily with breakfast.     furosemide (LASIX) 40 MG tablet Take 1 tablet (40 mg total) by mouth daily. 30 tablet 0   olmesartan (BENICAR) 20 MG tablet TAKE 1 TABLET(20 MG) BY MOUTH DAILY 30 tablet 0   omeprazole (PRILOSEC) 20 MG capsule Take 1 capsule (20 mg total) by mouth daily. Please keep your March Appointment for further refills. Thank you 30 capsule 0   rivaroxaban (XARELTO) 20 MG TABS tablet TAKE 1 TABLET(20 MG) BY MOUTH DAILY WITH SUPPER 30 tablet 0   No current facility-administered medications for this visit.   No Known Allergies   Review of Systems: All systems reviewed and negative except where noted in HPI.   Lab Results  Component Value Date   WBC 5.8 09/11/2021   HGB 11.1 (A) 09/11/2021   HCT 32 (A) 09/11/2021   MCV 100.3 (H) 08/28/2021   PLT 420 (A) 09/11/2021    Lab Results  Component Value Date   CREATININE 0.8 09/11/2021   BUN 33 (A) 09/11/2021   NA 139 09/11/2021   K 4.4 09/11/2021   CL 101 09/11/2021   CO2 29 (A) 09/11/2021    Lab Results  Component Value Date   ALT 16  08/25/2021   AST 24 08/25/2021   ALKPHOS 38 08/25/2021   BILITOT 0.5 08/25/2021     Physical Exam: BP (!) 80/50 (BP Location: Left Arm, Patient Position: Sitting, Cuff Size: Normal)   Pulse 100   Ht 6' (1.829 m)   Wt 193 lb (87.5 kg)   BMI 26.18 kg/m  Constitutional: Pleasant, male in no acute distress, using walker to ambulated DRE - Tia Alert CMA as standby - no external fissure or hemorrhoids. Old blood on DRE, no mass lesions, no pain Neurological: Alert and oriented to person place and time. Psychiatric: Normal mood and affect. Behavior is normal.   ASSESSMENT: 81 y.o. male here for assessment of the following  1. Rectal bleeding   2. Diarrhea of presumed infectious origin   3. Radiation proctitis   4. Anticoagulated   5. History of colon polyps   6. Barrett's esophagus without dysplasia    As above, patient with a history of C. difficile, history of radiation proctitis, on chronic Xarelto, recovering from hospitalization a few months ago, presenting with a week and a half worth of loose watery stools along with rectal bleeding.  On DRE I do not see anything external to be causing this, he has some old blood in the vault.  Hard for him to quantify exactly how much bleeding he is having, sounds like he is having blood with most bowel movements.   I discussed differential diagnosis for his bleeding and diarrhea - suspect this could be bleeding from radiation proctitis while hemorrhoidal also possible. He needs C. difficile PCR to rule out recurrent C. Difficile. Diverticular bleed would seem less likely given duration of his symptoms.  While he thinks his bleeding has improved slightly in recent days, he is frail in general, his blood pressure was low in the office - on review of notes his BP has fluctuated in the past.  Did a CBC today and his Hgb returned (late) at 9.2. His prior baseline was 11.1 in March.  I recommended he go to the hospital for admission given this result  and low blood pressure. He really does not want to go to the hospital and manage this as outpatient if possible.  I discussed with him that he is at risk for life-threatening hemorrhage being on an anticoagulant and his reserve to be able to handle further blood loss is low given his comorbidities.  With his hemoglobin and his hypotension I strongly recommend he go to the hospital but he does not want to go and declined.  I called them a few hours after they got home from our visit.  I spoke to him and his wife for several minutes. His wife is a Marine scientist, she repeated his blood pressure at home 2 more times and it was 120s over 50s to 70s.  His pulse remains around 100.  Again it is safest for him to go to the hospital this evening to make to manage this, and recommended again he go to the hospital.  They understand this but have elected to stay at home tonight.  If that is the case, at this time I recommend he hold his Lasix, olmesartan, and Xarelto tonight while we sort this out.  They are agreeable to take him to the hospital if he has worsening bleeding, lightheaded/dizziness, feeling worse etc. , or his vitals worsen.    Otherwise, the only way to truly sort out what is causing his bleeding  is to do a flex sig or colonoscopy.  He wants to avoid that.  I will empirically treat him for radiation proctitis with Carafate suppositories.  If he has C. difficile we will treat him for that as well, he will submit stool sample for C. Difficile.  I will have our staff check on him tomorrow to see how he is doing and he will need a repeat CBC in the next 24 to 48 hours to make sure he is not having any significant worsening of his anemia.  Hopefully he responds to conservative measures.  Of note we briefly discussed his history of Barrett's, he is on PPI without any alarm symptoms.  Given his age and comorbidities he is not interested in further surveillance endoscopy.  At the same time he is not interested in  surveillance colonoscopy for his history of colon polyps.  He would only do flex sig or colonoscopy if his symptoms persist.  PLAN: - as above recommended the patient go to the hospital for admission.  Lengthy discussion with the patient and his wife in the office and after hours with phone call. Patient declines. His wife is a Marine scientist and has repeated his blood pressure at home which is much better than it was in the office. She will keep an eye on his vitals tonight and agrees to take him to the hospital if she thinks things are worsening.   - starting Carafate suppositories empirically for treatment of radiation proctitis - C Diff PCR - continue PPI for Barrett's - as above holding off on surveillance EGD given comorbidities and age - will need flex sig or colonoscopy if symptoms persist, if he is agreeable - holding BP meds and Xarelto tonight for now - if he does not go to the hospital overnight will call them tomorrow to check in and repeat CBC in near future - if any worsening overnight they agree to go to the hospital in that situation  Jolly Mango, MD [facility] Gastroenterology

## 2022-01-23 NOTE — Addendum Note (Signed)
Addended by: Yetta Flock on: 01/23/2022 06:15 PM   Modules accepted: Level of Service

## 2022-01-23 NOTE — Patient Instructions (Addendum)
_______________________________________________________  If you are age 82 or older, your body mass index should be between 23-30. Your Body mass index is 26.18 kg/m. If this is out of the aforementioned range listed, please consider follow up with your Primary Care Provider.  If you are age 2 or younger, your body mass index should be between 19-25. Your Body mass index is 26.18 kg/m. If this is out of the aformentioned range listed, please consider follow up with your Primary Care Provider.  ________________________________________________________  The [facility] providers would like to encourage you to use [facility] to communicate with providers for non-urgent requests or questions.  Due to long hold times on the telephone, sending your provider a message by [facility] may be a faster and more efficient way to get a response.  Please allow 48 business hours for a response.  Please remember that this is for non-urgent requests.  _______________________________________________________  Please go to the lab in the basement of our building to have lab work done as you leave today. Hit "B" for basement when you get on the elevator.  When the doors open the lab is on your left.  We will call you with the results. Thank you.  We have sent the following medications to [facility] located at [id number], [place], [place] [address] at  for you to pick up at your convenience. Their number is  [phone]. Carafate suppository 85 mg: insert rectally twice daily.   Thank you for entrusting me with your care and for choosing [facility], Dr. [place] Cellar

## 2022-01-24 ENCOUNTER — Other Ambulatory Visit: Payer: Self-pay

## 2022-01-24 ENCOUNTER — Other Ambulatory Visit (INDEPENDENT_AMBULATORY_CARE_PROVIDER_SITE_OTHER): Payer: PPO

## 2022-01-24 DIAGNOSIS — Z7901 Long term (current) use of anticoagulants: Secondary | ICD-10-CM

## 2022-01-24 DIAGNOSIS — K625 Hemorrhage of anus and rectum: Secondary | ICD-10-CM

## 2022-01-24 DIAGNOSIS — R197 Diarrhea, unspecified: Secondary | ICD-10-CM

## 2022-01-24 DIAGNOSIS — K2270 Barrett's esophagus without dysplasia: Secondary | ICD-10-CM | POA: Diagnosis not present

## 2022-01-24 DIAGNOSIS — Z86010 Personal history of colonic polyps: Secondary | ICD-10-CM

## 2022-01-24 DIAGNOSIS — K627 Radiation proctitis: Secondary | ICD-10-CM

## 2022-01-24 LAB — CBC WITH DIFFERENTIAL/PLATELET
Basophils Absolute: 0 10*3/uL (ref 0.0–0.1)
Basophils Relative: 0.7 % (ref 0.0–3.0)
Eosinophils Absolute: 0.1 10*3/uL (ref 0.0–0.7)
Eosinophils Relative: 2.2 % (ref 0.0–5.0)
HCT: 26.9 % — ABNORMAL LOW (ref 39.0–52.0)
Hemoglobin: 9.2 g/dL — ABNORMAL LOW (ref 13.0–17.0)
Lymphocytes Relative: 20.2 % (ref 12.0–46.0)
Lymphs Abs: 1.1 10*3/uL (ref 0.7–4.0)
MCHC: 34.1 g/dL (ref 30.0–36.0)
MCV: 98.7 fl (ref 78.0–100.0)
Monocytes Absolute: 0.6 10*3/uL (ref 0.1–1.0)
Monocytes Relative: 10.3 % (ref 3.0–12.0)
Neutro Abs: 3.6 10*3/uL (ref 1.4–7.7)
Neutrophils Relative %: 66.6 % (ref 43.0–77.0)
Platelets: 205 10*3/uL (ref 150.0–400.0)
RBC: 2.72 Mil/uL — ABNORMAL LOW (ref 4.22–5.81)
RDW: 16 % — ABNORMAL HIGH (ref 11.5–15.5)
WBC: 5.4 10*3/uL (ref 4.0–10.5)

## 2022-01-25 LAB — CLOSTRIDIUM DIFFICILE TOXIN B, QUALITATIVE, REAL-TIME PCR: Toxigenic C. Difficile by PCR: NOT DETECTED

## 2022-01-29 ENCOUNTER — Telehealth: Payer: Self-pay

## 2022-01-29 NOTE — Telephone Encounter (Signed)
Pt called in today. He was crying and stated that he needed to talk with Dr. Havery Moros, it was important. I told pt that unfortunately Dr. Havery Moros is out of the office this week. I told him that I am Dr. Doyne Keel nurse. He was audibly crying and could not speak in full sentences. I told the patient to take a moment to breathe. He stated that he received a bad call and has to go to [place]. Pt states that he has not seen any more blood since last week. I told pt that he is OK to travel if he is having an emergency and needs to leave. I told him that I will make you aware and we can repeat labs when he returns. Pt verbalized understanding and had no concerns at the end of the call.

## 2022-01-30 NOTE — Telephone Encounter (Signed)
Sorry to hear this [place]. If he is back on Xarelto, no further bleeding, yes I agree he should leave town to address family emergency. Not sure if he has time to get a CBC done before he leaves to make sure Hgb stable, if he does not and no further bleeding okay to have it done upon his return. If he is having bleeding symptoms that are bothering him still otherwise please let me know. Thanks

## 2022-01-30 NOTE — Telephone Encounter (Signed)
Pt notified of recommendations yesterday. New reminder in epic to follow up with patient next week.

## 2022-02-04 ENCOUNTER — Telehealth: Payer: Self-pay

## 2022-02-04 NOTE — Telephone Encounter (Signed)
-----   Message from Yevette Edwards, RN sent at 01/30/2022  8:37 AM EDT ----- Regarding: FW: Labs  ----- Message ----- From: Yevette Edwards, RN Sent: 01/30/2022  12:00 AM EDT To: Yevette Edwards, RN Subject: Labs                                           CBC - order is in epic

## 2022-02-04 NOTE — Telephone Encounter (Signed)
Called to follow up with patient to see if he was back in town. Pt states that he is still in [place] at this time. Pt states that they will be home soon and he will have labs drawn. He is aware that he can just stop by the lab when he returns. Pt states that he had 1 episode of bleeding due to constipation and straining. He is not taking any stool softeners, I told pt that he may want to try adding Miralax to his regimen. He did not take his Carafate suppositories out of town with him. He has also been off of Xarelto for a couple of days when he noticed the bleeding. I told pt that if he is no longer bleeding he should resume Xarelto. Pt will come by for labs as soon as he can. Pt verbalized understanding and had no concerns at the end of the call.

## 2022-02-06 ENCOUNTER — Telehealth: Payer: Self-pay | Admitting: Cardiology

## 2022-02-06 ENCOUNTER — Other Ambulatory Visit: Payer: Self-pay | Admitting: Adult Health

## 2022-02-06 DIAGNOSIS — Z86711 Personal history of pulmonary embolism: Secondary | ICD-10-CM

## 2022-02-06 NOTE — Telephone Encounter (Signed)
*  STAT* If patient is at the pharmacy, call can be transferred to refill team.   1. Which medications need to be refilled? (please list name of each medication and dose if known) rivaroxaban (XARELTO) 20 MG TABS tablet  2. Which pharmacy/location (including street and city if local pharmacy) is medication to be sent to? [place], [place] - [address] AT [place]  3. Do they need a 30 day or 90 day supply? 90 day  Patient has 1 tablet left.

## 2022-02-07 ENCOUNTER — Other Ambulatory Visit (INDEPENDENT_AMBULATORY_CARE_PROVIDER_SITE_OTHER): Payer: PPO

## 2022-02-07 DIAGNOSIS — R197 Diarrhea, unspecified: Secondary | ICD-10-CM

## 2022-02-07 DIAGNOSIS — Z7901 Long term (current) use of anticoagulants: Secondary | ICD-10-CM | POA: Diagnosis not present

## 2022-02-07 DIAGNOSIS — K627 Radiation proctitis: Secondary | ICD-10-CM

## 2022-02-07 DIAGNOSIS — K625 Hemorrhage of anus and rectum: Secondary | ICD-10-CM | POA: Diagnosis not present

## 2022-02-07 LAB — CBC WITH DIFFERENTIAL/PLATELET
Basophils Absolute: 0.1 10*3/uL (ref 0.0–0.1)
Basophils Relative: 0.9 % (ref 0.0–3.0)
Eosinophils Absolute: 0.2 10*3/uL (ref 0.0–0.7)
Eosinophils Relative: 3.3 % (ref 0.0–5.0)
HCT: 28.8 % — ABNORMAL LOW (ref 39.0–52.0)
Hemoglobin: 9.6 g/dL — ABNORMAL LOW (ref 13.0–17.0)
Lymphocytes Relative: 17.6 % (ref 12.0–46.0)
Lymphs Abs: 1 10*3/uL (ref 0.7–4.0)
MCHC: 33.3 g/dL (ref 30.0–36.0)
MCV: 98.7 fl (ref 78.0–100.0)
Monocytes Absolute: 0.6 10*3/uL (ref 0.1–1.0)
Monocytes Relative: 9.8 % (ref 3.0–12.0)
Neutro Abs: 3.9 10*3/uL (ref 1.4–7.7)
Neutrophils Relative %: 68.4 % (ref 43.0–77.0)
Platelets: 251 10*3/uL (ref 150.0–400.0)
RBC: 2.92 Mil/uL — ABNORMAL LOW (ref 4.22–5.81)
RDW: 15.6 % — ABNORMAL HIGH (ref 11.5–15.5)
WBC: 5.7 10*3/uL (ref 4.0–10.5)

## 2022-02-07 MED ORDER — RIVAROXABAN 20 MG PO TABS: ORAL_TABLET | ORAL | 0 refills | Status: DC

## 2022-02-07 NOTE — Telephone Encounter (Signed)
Prescription refill request for Xarelto received.  Indication: Dvt. Pe Last office visit: [place], 04/20/2021 Weight: 87.5 kg Age: 81 yo  Scr: 0.8, 09/11/2021 CrCl:  88 ml/min

## 2022-02-18 ENCOUNTER — Telehealth: Payer: Self-pay

## 2022-02-18 NOTE — Telephone Encounter (Signed)
Pt called into the office today. He wanted to know if we could fax all of his labs from August to his PCP. I told pt that I will fax labs today. Pt also wanted to know if he could d/c Carafate suppositories since he is no longer bleeding? Pt has still been using 1 suppository daily, but wishes to discontinue if he can. Please advise, thanks.   Labs printed and faxed to Dr. Deland Pretty at [phone].

## 2022-02-18 NOTE — Telephone Encounter (Signed)
Spoke with pt and he is aware of Dr. Doyne Keel recommendations.

## 2022-02-18 NOTE — Telephone Encounter (Signed)
I would not stop using completely, I would taper off slowly the suppositories.  Can use 1 every other day for 2 weeks or so and then stop.  I am scheduled to see him in the office in a few weeks and we can discuss long-term plan at that time.  Thanks

## 2022-03-05 ENCOUNTER — Telehealth: Payer: PPO | Admitting: Gastroenterology

## 2022-03-05 DIAGNOSIS — D649 Anemia, unspecified: Secondary | ICD-10-CM | POA: Diagnosis not present

## 2022-03-05 DIAGNOSIS — K2270 Barrett's esophagus without dysplasia: Secondary | ICD-10-CM

## 2022-03-05 DIAGNOSIS — Z7901 Long term (current) use of anticoagulants: Secondary | ICD-10-CM | POA: Diagnosis not present

## 2022-03-05 DIAGNOSIS — K627 Radiation proctitis: Secondary | ICD-10-CM

## 2022-03-05 NOTE — Progress Notes (Signed)
Virtual Visit via Video Note  I connected with [name] on 03/05/22 at 11:00 AM EDT by a video enabled telemedicine application and verified that I am speaking with the correct person using two identifiers.  Location: Patient: home Provider: office   I discussed the limitations of evaluation and management by telemedicine and the availability of in person appointments. The patient expressed understanding and agreed to proceed.   HPI :  81 year old male here for a follow-up visit for rectal bleeding. Recall he has a history of Barrett's esophagus and multiple colonic adenomas, iron deficiency anemia, recurrent C Diff. He has a history of pulmonary embolism and remains on Xarelto.   Most notably he has a history of radiation proctitis noted on prior colonoscopies.  I saw him in the office in early August for rectal bleeding that has been going on for a few weeks in the setting of some loose stool.  We had discussed options at that time, he was anemic with a drop in his hemoglobin from baseline 11 to 9.2 in the setting of Xarelto.  He stopped his Xarelto.  I recommended evaluation in the hospital but he declined at the time.  He tested negative for C. difficile.  I started him empirically on Carafate suppositories to treat empirically for radiation proctitis.  He reports the suppositories worked really well.  He states this has completely resolved his rectal bleeding over time.  He had a follow-up hemoglobin on August 17 which showed his hemoglobin increased to 9.6.  He is on iron supplementation.  His diarrhea has resolved.  He is not having any further rectal bleeding.  For the past 2 weeks he has reduced his Carafate suppositories to once every other day and he continues to feel well.  He denies any pain in his rectum.  He does have some generalized weakness and fatigue, but otherwise doing well.   Recall he was admitted to the hospital for symptomatic anemia in 2020.  He had an EGD  and colonoscopy at that time.  EGD showed 4 cm segment of Barrett's esophagus but no cause for anemia.  He had radiation proctitis in his colon, residual polyp in the right colon from prior EMR that was removed, and internal hemorrhoids.  His blood counts had improved over time.  Radiation proctitis was not bleeding at the time, was not treated endoscopically.  He is also been in the hospital earlier this year with pneumonia causing sepsis, course was complicated by diastolic heart failure.  He was pretty frail when I saw him in the office at the last visit.  He is rather adamant he does not want any further colonoscopy or endoscopy performed unless he really needs it due to symptoms.  He is declining surveillance.  He is maintained on omeprazole for Barrett's esophagus.  Endoscopic history: EGD 04/25/2015 - Evidence of Barrett's esophagus was noted at the distal esophagus (C2M3) - no dysplasia, 7cm hiatal hernia, multiple benign gastric polyps. Recall 04/2018 Colonoscopy 04/25/2015 - severe diverticulosis, 3-4cm polyp in ascending colon not removed, another 1cm polyp removed, 17m sigmoid polyp, radiation proctitis s/p APC. Polyps c/w adenoma. Referred to Dr. GArsenio Loaderfor polypectomy [facility]  EGD 07/11/18 - Prague classification C4M4 Barrett's esophagus was noted in the distal esophagus. The EG landmarks were not clear given the large hiatal hernia however it looks like the EG junction is at 32cm from the incisors and the proximal edge of the Barrett's mucosa is  at 36cm from the incisors. No cameron lesions   Colonoscopy 07/11/18 - 1.5cm area of residual polyp, removed piecemeal via EMR, radiation proctitis, medium hemorrhoids, diverticulosis      Echo 08/29/2021 - EF 55-60%, mild LVH,      Past Medical History:  Diagnosis Date   AAA (abdominal aortic aneurysm) ([place]) followed by dr Stanford Breed   last duplex 07-17-2017  3.2 x 3.3   Anticoagulant long-term use    xarelto  for hx PEs and DVT   Arthritis    back   Barrett's esophagus 2009   Bilateral lower extremity edema    chronic pedal   CAD (coronary artery disease) cardioloigst--  dr Stanford Breed   cardiac cath 04-12-2014  single vessel obstructive cad involving ostrium D2, and other nonobstructive cad , normall lvf   Chronic lower back pain    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[place])    per duplex 07-17-2017  left cia 2 x 2, right cia 2.1 x 2   Diverticulosis    Dyspnea on exertion    FH: colonic polyps    GERD (gastroesophageal reflux disease)    Gout    08-27-2017  per pt stable   History of adenomatous polyp of colon    History of DVT of lower extremity    10/ 2015  LLE   History of esophageal stricture    post dilation   History of gastric polyp    benign   History of pulmonary embolus (PE)    2005  treated 1 yr w/ coumadin and 10/ 2015  bilateral   History of radiation therapy 06/22/2012-08/18/2012   78 Gy to prostate (external beam)   History of subdural hematoma 03/2012   s/p  evacuation   Hypertension    Lesion of bladder    non canerous   Prostate cancer [facility]) urologist-  dr wrenn/ oncologist- dr Sondra Come   dx 07/ 2013--  Stage T1c, Gleson 7, vol 67cc--- completed external beam radiation therapy 08-18-2012   Pulmonary HTN ([place]) 04/12/2014   last echo 05-01-2017  PASP 32mHg   RBBB (right bundle branch block with left anterior fascicular block)    ocassional     Past Surgical History:  Procedure Laterality Date   BIOPSY  07/11/2018   Procedure: BIOPSY;  Surgeon: JMilus Banister MD;  [facility];  Service: Endoscopy;;   CATARACT EXTRACTION W/ INTRAOCULAR LENS  IMPLANT, BILATERAL  2018   COLONOSCOPY WITH PROPOFOL N/A 04/25/2015   Procedure: COLONOSCOPY WITH PROPOFOL;  Surgeon: SManus Gunning MD;  [facility];  Service: Gastroenterology;  Laterality: N/A;   COLONOSCOPY WITH PROPOFOL N/A 07/11/2018   Procedure: COLONOSCOPY WITH PROPOFOL;  Surgeon: JMilus Banister  MD;  [facility];  Service: Endoscopy;  Laterality: N/A;   CRANIOTOMY  04/03/2012   Procedure: CRANIOTOMY HEMATOMA EVACUATION SUBDURAL;  Surgeon: JErline Levine MD;  [facility];  Service: Neurosurgery;  Laterality: Left;  Left Craniotomy for Evacuation of Subdural Hematoma   CYSTOSCOPY WITH BIOPSY N/A 09/02/2017   Procedure: CYSTOSCOPY WITH BIOPSY AND FULGURATION POSSIBLE TRANSURETHRAL RESECTION OF BLADDER TUMOR;  Surgeon: WIrine Seal MD;  [facility]  Service: Urology;  Laterality: N/A;   ESOPHAGOGASTRODUODENOSCOPY (EGD) WITH PROPOFOL N/A 04/25/2015   Procedure: ESOPHAGOGASTRODUODENOSCOPY (EGD) WITH PROPOFOL;  Surgeon: SManus Gunning MD;  [facility];  Service: Gastroenterology;  Laterality: N/A;   ESOPHAGOGASTRODUODENOSCOPY (EGD) WITH PROPOFOL N/A 07/11/2018   Procedure: ESOPHAGOGASTRODUODENOSCOPY (EGD) WITH PROPOFOL;  Surgeon: JMilus Banister MD;  [facility];  Service: Endoscopy;  Laterality: N/A;   IRRIGATION AND DEBRIDEMENT SEBACEOUS CYST  01-11-13   "off my back"   KYPHOPLASTY N/A 12/23/2017   Procedure: Lumbar One KYPHOPLASTY;  Surgeon: Erline Levine, MD;  Location: [facility];  Service: Neurosurgery;  Laterality: N/A;  Lumbar One KYPHOPLASTY   LEFT AND RIGHT HEART CATHETERIZATION WITH CORONARY ANGIOGRAM N/A 04/12/2014   Procedure: LEFT AND RIGHT HEART CATHETERIZATION WITH CORONARY ANGIOGRAM;  Surgeon: Peter M Martinique, MD;  Location: [facility] CATH LAB;  Service: Cardiovascular;  Laterality: N/A;   ORIF FEMUR FRACTURE Left 10-02-2010   dr Alvan Dame   subtrochanteric   POLYPECTOMY  07/11/2018   Procedure: POLYPECTOMY;  Surgeon: Milus Banister, MD;  [facility];  Service: Endoscopy;;   TONSILLECTOMY  1940's   TRANSTHORACIC ECHOCARDIOGRAM  05/01/2017   mild concentric LVH, ef 63-87%, grade 1 diastolic dysfunction/ trivial AR/ mild dilated ascending aorta/ mild to moderate MR (no stenosis)/ mild LAE/ mild TR/ mild to  moderate increase PASP 34mHg   Family History  Problem Relation Age of Onset   Macular degeneration Mother    Osteoarthritis Mother    Aneurysm Father        d/o 676yo, ruptured AAA   Heart disease Other        No family history   Colon cancer Neg Hx    Colon polyps Neg Hx    Diabetes Neg Hx    Social History   Tobacco Use   Smoking status: Former    Packs/day: 1.00    Years: 20.00    Total pack years: 20.00    Types: Cigarettes    Quit date: 12/23/1971    Years since quitting: 50.2   Smokeless tobacco: Never  Vaping Use   Vaping Use: Never used  Substance Use Topics   Alcohol use: Yes    Alcohol/week: 7.0 standard drinks of alcohol    Types: 7 Cans of beer per week    Comment: 1 beer daily   Drug use: No   Current Outpatient Medications  Medication Sig Dispense Refill   allopurinol (ZYLOPRIM) 300 MG tablet Take 1 tablet (300 mg total) by mouth every morning. 30 tablet 0   AMBULATORY NON FORMULARY MEDICATION Carafate suppository 85 mg: Insert rectally twice daily 60 suppository 2   ferrous sulfate 325 (65 FE) MG tablet Take 325 mg by mouth daily with breakfast.     furosemide (LASIX) 40 MG tablet Take 1 tablet (40 mg total) by mouth daily. 30 tablet 0   olmesartan (BENICAR) 20 MG tablet TAKE 1 TABLET(20 MG) BY MOUTH DAILY 30 tablet 0   omeprazole (PRILOSEC) 20 MG capsule Take 1 capsule (20 mg total) by mouth daily. Please keep your March Appointment for further refills. Thank you 30 capsule 0   rivaroxaban (XARELTO) 20 MG TABS tablet TAKE 1 TABLET(20 MG) BY MOUTH DAILY WITH SUPPER 90 tablet 0   No current facility-administered medications for this visit.   No Known Allergies   Review of Systems: All systems reviewed and negative except where noted in HPI.   Lab Results  Component Value Date   WBC 5.7 02/07/2022   HGB 9.6 (L) 02/07/2022   HCT 28.8 (L) 02/07/2022   MCV 98.7 02/07/2022   PLT 251.0 02/07/2022       Latest Ref Rng & Units 02/07/2022    1:58 PM  01/24/2022   10:39 AM 01/23/2022    4:27 PM  CBC  WBC 4.0 - 10.5 K/uL  5.7  5.4  4.9   Hemoglobin 13.0 - 17.0 g/dL 9.6  9.2  9.2   Hematocrit 39.0 - 52.0 % 28.8  26.9  27.9   Platelets 150.0 - 400.0 K/uL 251.0  205.0  187.0      Physical Exam: There were no vitals taken for this visit. Constitutional: Pleasant,well-developed, male in no acute distress.  ASSESSMENT: 81 y.o. male here for assessment of the following  1. Radiation proctitis   2. Anemia, unspecified type   3. Anticoagulated   4. Barrett's esophagus without dysplasia    As above, rectal bleeding associated with anemia in recent months.  He declined flex Sig/colonoscopy at the time.  We held his Xarelto for short period of time and started him on Carafate suppositories empirically for radiation proctitis which has fortunately worked really well.  His symptoms resolved fairly quickly with therapy.  He has resumed his Xarelto, hemoglobin is uptrending a few weeks ago.  Generally doing really well.  Again he is not interested in pursuing any endoscopic evaluation for this nor is he wishing to pursue any surveillance of his Barrett's esophagus given his other medical problems.  I recommend we check CBC in the next 1 to 2 weeks to make sure appropriate uptrend in his hemoglobin given he continues to feel rather weak in general.  We discussed how long to continue the Carafate suppositories.  I think okay to continue every other day dosing for now to make sure stable over a period of time.  If his hemoglobin has risen appropriately and he continues to do well we may be able to stop it but at risk for recurrence when stopping this.  He will contact me if he has any recurrence of symptoms in the interim.  Continue omeprazole for his Barrett's esophagus.  No dysphagia.  I would like to see him in the office in 3 to 4 months for reassessment.  He agrees and will call me in the interim with any issues.   PLAN: - continue carafate suppositories  QOD, may taper off if Hgb rising appropriately and no further bleeding - CBC in 1-2 weeks - continue omeprazole - patient declines endoscopic evaluation - f/u in the office in 3-4 months  I provided a total of 30 minutes of time including time spent discussing over virtual platform, review of his medical record, and documenting this encounter.  Yetta Flock, MD

## 2022-03-05 NOTE — Patient Instructions (Signed)
Continue Carafate suppositories.   Your provider has requested that you go to the basement level for lab work in 1-2 weeks. Press "B" on the elevator. The lab is located at the first door on the left as you exit the elevator.  The [facility] providers would like to encourage you to use [facility] to communicate with providers for non-urgent requests or questions.  Due to long hold times on the telephone, sending your provider a message by [facility] may be a faster and more efficient way to get a response.  Please allow 48 business hours for a response.  Please remember that this is for non-urgent requests.   Due to recent changes in healthcare laws, you may see the results of your imaging and laboratory studies on MyChart before your provider has had a chance to review them.  We understand that in some cases there may be results that are confusing or concerning to you. Not all laboratory results come back in the same time frame and the provider may be waiting for multiple results in order to interpret others.  Please give [place] 48 hours in order for your provider to thoroughly review all the results before contacting the office for clarification of your results.   Thank you for entrusting me with your care and for choosing [facility], Dr. Dana Cellar

## 2022-03-20 ENCOUNTER — Other Ambulatory Visit (INDEPENDENT_AMBULATORY_CARE_PROVIDER_SITE_OTHER): Payer: PPO

## 2022-03-20 DIAGNOSIS — K627 Radiation proctitis: Secondary | ICD-10-CM

## 2022-03-20 DIAGNOSIS — Z7901 Long term (current) use of anticoagulants: Secondary | ICD-10-CM | POA: Diagnosis not present

## 2022-03-20 DIAGNOSIS — D649 Anemia, unspecified: Secondary | ICD-10-CM | POA: Diagnosis not present

## 2022-03-20 LAB — CBC WITH DIFFERENTIAL/PLATELET
Basophils Absolute: 0 10*3/uL (ref 0.0–0.1)
Basophils Relative: 0.9 % (ref 0.0–3.0)
Eosinophils Absolute: 0.1 10*3/uL (ref 0.0–0.7)
Eosinophils Relative: 3.4 % (ref 0.0–5.0)
HCT: 38.3 % — ABNORMAL LOW (ref 39.0–52.0)
Hemoglobin: 12.6 g/dL — ABNORMAL LOW (ref 13.0–17.0)
Lymphocytes Relative: 28 % (ref 12.0–46.0)
Lymphs Abs: 1.2 10*3/uL (ref 0.7–4.0)
MCHC: 32.9 g/dL (ref 30.0–36.0)
MCV: 95 fl (ref 78.0–100.0)
Monocytes Absolute: 0.5 10*3/uL (ref 0.1–1.0)
Monocytes Relative: 11.1 % (ref 3.0–12.0)
Neutro Abs: 2.3 10*3/uL (ref 1.4–7.7)
Neutrophils Relative %: 56.6 % (ref 43.0–77.0)
Platelets: 187 10*3/uL (ref 150.0–400.0)
RBC: 4.03 Mil/uL — ABNORMAL LOW (ref 4.22–5.81)
RDW: 13.9 % (ref 11.5–15.5)
WBC: 4.2 10*3/uL (ref 4.0–10.5)

## 2022-03-30 ENCOUNTER — Other Ambulatory Visit: Payer: Self-pay | Admitting: Cardiology

## 2022-03-30 DIAGNOSIS — Z86711 Personal history of pulmonary embolism: Secondary | ICD-10-CM

## 2022-04-01 NOTE — Telephone Encounter (Signed)
Prescription refill request for Xarelto received.  Indication: PE Last office visit: 04/10/21 Stanford Breed)  Weight: 87.5kg Age: 81 Scr: 0.8 (09/11/21) CrCl: 88.67m/min  Appropriate dose and refill sent to requested pharmacy.

## 2022-04-02 ENCOUNTER — Telehealth: Payer: Self-pay | Admitting: Gastroenterology

## 2022-04-02 NOTE — Telephone Encounter (Signed)
Lab results from 8/2-9/27 have been printed and faxed to Dr. Shelia Media at [phone] and Dr. Jeffie Pollock at [phone].

## 2022-04-02 NOTE — Telephone Encounter (Signed)
Inbound call from patient requesting we send results from his blood test that Dr. Havery Moros did sent to Dr. Shelia Media and Dr. Jeffie Pollock. Please advise.

## 2022-04-03 ENCOUNTER — Other Ambulatory Visit: Payer: Self-pay

## 2022-04-03 DIAGNOSIS — I10 Essential (primary) hypertension: Secondary | ICD-10-CM

## 2022-04-03 MED ORDER — OLMESARTAN MEDOXOMIL 20 MG PO TABS: ORAL_TABLET | ORAL | 1 refills | Status: DC

## 2022-04-04 DIAGNOSIS — Z23 Encounter for immunization: Secondary | ICD-10-CM | POA: Diagnosis not present

## 2022-05-09 ENCOUNTER — Other Ambulatory Visit: Payer: Self-pay | Admitting: Adult Health

## 2022-05-09 DIAGNOSIS — I10 Essential (primary) hypertension: Secondary | ICD-10-CM

## 2022-10-18 ENCOUNTER — Other Ambulatory Visit: Payer: Self-pay | Admitting: *Deleted

## 2022-10-18 DIAGNOSIS — Z86711 Personal history of pulmonary embolism: Secondary | ICD-10-CM

## 2022-10-18 MED ORDER — RIVAROXABAN 20 MG PO TABS: ORAL_TABLET | ORAL | 1 refills | Status: DC

## 2022-12-18 NOTE — Progress Notes (Signed)
Virtual Visit via Telephone Note   Because of [name] co-morbid illnesses, he is at least at moderate risk for complications without adequate follow up.  This format is felt to be most appropriate for this patient at this time.  The patient did not have access to video technology/had technical difficulties with video requiring transitioning to audio format only (telephone).  All issues noted in this document were discussed and addressed.  No physical exam could be performed with this format.  Please refer to the patient's chart for his consent to telehealth for [facility].   Date:  12/30/2022   ID:  [name], DOB September 06, 1940, MRN [id number]  Patient Location:Home Provider Location: Home  PCP:  Merri Brunette, MD  Cardiologist:  Dr Jens Som  Evaluation Performed:  Follow-Up Visit  Chief Complaint:  FU CAD  History of Present Illness:    FU CAD. Pt with h/o unexplained pulmonary embolus years ago treated with one year of Coumadin. Patient was admitted in October of 2013 and found to have a subdural hematoma which required evacuation. Seen for dyspnea 10/15. Cardiac catheterization October 2015 showed an 80% second diagonal but otherwise nonobstructive coronary disease. Ejection fraction 55-65%. Chest CT October 2015 showed bilateral pulmonary emboli. Echocardiogram May 2019 showed normal LV function and mild mitral regurgitation. Patient admitted January 2020 with dyspnea and hemoglobin 5.9. Patient was transfused. He had EGD and colonoscopy that revealed Barrett's esophagus, hiatal hernia, diverticulosis, internal hemorrhoids and radiation proctitis. Xarelto ultimately resumed. Abdominal ultrasound October 2022 showed abdominal aorta 2.5 cm.  However aorta and iliac arteries not fully visualized..  Echocardiogram March 2023 showed normal LV function, mild left ventricular hypertrophy, moderate right ventricular enlargement, mild to moderate tricuspid regurgitation, trace  aortic insufficiency. Since I last saw him, patient denies dyspnea, chest pain or syncope.  He complains of fatigue.  The patient does not have symptoms concerning for COVID-19 infection (fever, chills, cough, or new shortness of breath).    Past Medical History:  Diagnosis Date   AAA (abdominal aortic aneurysm) ([facility]) followed by dr Jens Som   last duplex 07-17-2017  3.2 x 3.3   Anticoagulant long-term use    xarelto for hx PEs and DVT   Arthritis    back   Barrett's esophagus 2009   Bilateral lower extremity edema    chronic pedal   CAD (coronary artery disease) cardioloigst--  dr Jens Som   cardiac cath 04-12-2014  single vessel obstructive cad involving ostrium D2, and other nonobstructive cad , normall lvf   Chronic lower back pain    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[facility])    per duplex 07-17-2017  left cia 2 x 2, right cia 2.1 x 2   Diverticulosis    Dyspnea on exertion    FH: colonic polyps    GERD (gastroesophageal reflux disease)    Gout    08-27-2017  per pt stable   History of adenomatous polyp of colon    History of DVT of lower extremity    10/ 2015  LLE   History of esophageal stricture    post dilation   History of gastric polyp    benign   History of pulmonary embolus (PE)    2005  treated 1 yr w/ coumadin and 10/ 2015  bilateral   History of radiation therapy 06/22/2012-08/18/2012   78 Gy to prostate (external beam)   History of subdural hematoma 03/2012   s/p  evacuation   Hypertension    Lesion of bladder  non canerous   Prostate cancer [facility]) urologist-  dr wrenn/ oncologist- dr Roselind Messier   dx 07/ 2013--  Stage T1c, Gleson 7, vol 67cc--- completed external beam radiation therapy 08-18-2012   Pulmonary HTN ([facility]) 04/12/2014   last echo 05-01-2017  PASP   RBBB (right bundle branch block with left anterior fascicular block)    ocassional   Past Surgical History:  Procedure Laterality Date   BIOPSY  07/11/2018   Procedure: BIOPSY;  Surgeon: Rachael Fee, MD;  [facility];  Service: Endoscopy;;   CATARACT EXTRACTION W/ INTRAOCULAR LENS  IMPLANT, BILATERAL  2018   COLONOSCOPY WITH PROPOFOL N/A 04/25/2015   Procedure: COLONOSCOPY WITH PROPOFOL;  Surgeon: Ruffin Frederick, MD;  Location: [facility];  Service: Gastroenterology;  Laterality: N/A;   COLONOSCOPY WITH PROPOFOL N/A 07/11/2018   Procedure: COLONOSCOPY WITH PROPOFOL;  Surgeon: Rachael Fee, MD;  [facility];  Service: Endoscopy;  Laterality: N/A;   CRANIOTOMY  04/03/2012   Procedure: CRANIOTOMY HEMATOMA EVACUATION SUBDURAL;  Surgeon: Maeola Harman, MD;  [facility];  Service: Neurosurgery;  Laterality: Left;  Left Craniotomy for Evacuation of Subdural Hematoma   CYSTOSCOPY WITH BIOPSY N/A 09/02/2017   Procedure: CYSTOSCOPY WITH BIOPSY AND FULGURATION POSSIBLE TRANSURETHRAL RESECTION OF BLADDER TUMOR;  Surgeon: Bjorn Pippin, MD;  Location: [facility];  Service: Urology;  Laterality: N/A;   ESOPHAGOGASTRODUODENOSCOPY (EGD) WITH PROPOFOL N/A 04/25/2015   Procedure: ESOPHAGOGASTRODUODENOSCOPY (EGD) WITH PROPOFOL;  Surgeon: Ruffin Frederick, MD;  [facility];  Service: Gastroenterology;  Laterality: N/A;   ESOPHAGOGASTRODUODENOSCOPY (EGD) WITH PROPOFOL N/A 07/11/2018   Procedure: ESOPHAGOGASTRODUODENOSCOPY (EGD) WITH PROPOFOL;  Surgeon: Rachael Fee, MD;  [facility];  Service: Endoscopy;  Laterality: N/A;   IRRIGATION AND DEBRIDEMENT SEBACEOUS CYST  01-11-13   "off my back"   KYPHOPLASTY N/A 12/23/2017   Procedure: Lumbar One KYPHOPLASTY;  Surgeon: Maeola Harman, MD;  [facility];  Service: Neurosurgery;  Laterality: N/A;  Lumbar One KYPHOPLASTY   LEFT AND RIGHT HEART CATHETERIZATION WITH CORONARY ANGIOGRAM N/A 04/12/2014   Procedure: LEFT AND RIGHT HEART CATHETERIZATION WITH CORONARY ANGIOGRAM;  Surgeon: Peter M Swaziland, MD;  Location: [facility] CATH LAB;  Service: Cardiovascular;  Laterality: N/A;   ORIF FEMUR  FRACTURE Left 10-02-2010   dr Charlann Boxer   subtrochanteric   POLYPECTOMY  07/11/2018   Procedure: POLYPECTOMY;  Surgeon: Rachael Fee, MD;  [facility];  Service: Endoscopy;;   TONSILLECTOMY  1940's   TRANSTHORACIC ECHOCARDIOGRAM  05/01/2017   mild concentric LVH, ef 55-60%, grade 1 diastolic dysfunction/ trivial AR/ mild dilated ascending aorta/ mild to moderate MR (no stenosis)/ mild LAE/ mild TR/ mild to moderate increase PASP     Current Meds  Medication Sig   ferrous sulfate 325 (65 FE) MG tablet Take 325 mg by mouth daily with breakfast.   furosemide (LASIX) 40 MG tablet Take 1 tablet (40 mg total) by mouth daily.   omeprazole (PRILOSEC) 20 MG capsule Take 1 capsule (20 mg total) by mouth daily. Please keep your March Appointment for further refills. Thank you   rivaroxaban (XARELTO) 20 MG TABS tablet TAKE 1 TABLET(20 MG) BY MOUTH DAILY WITH SUPPER     Allergies:   Patient has no known allergies.   Social History   Tobacco Use   Smoking status: Former    Packs/day: 1.00    Years: 20.00    Additional pack years: 0.00    Total pack years: 20.00  Types: Cigarettes    Quit date: 12/23/1971    Years since quitting: 51.0   Smokeless tobacco: Never  Vaping Use   Vaping Use: Never used  Substance Use Topics   Alcohol use: Yes    Alcohol/week: 7.0 standard drinks of alcohol    Types: 7 Cans of beer per week    Comment: 1 beer daily   Drug use: No     Family Hx: The patient's family history includes Aneurysm in his father; Heart disease in an other family member; Macular degeneration in his mother; Osteoarthritis in his mother. There is no history of Colon cancer, Colon polyps, or Diabetes.  ROS:   Please see the history of present illness.    No Fever, chills  or productive cough; does have chronic back pain and cannot bend. All other systems reviewed and are negative.     Recent Labs: 03/20/2022: Hemoglobin 12.6; Platelets 187.0     Wt Readings  from Last 3 Encounters:  12/30/22 180 lb (81.6 kg)  01/23/22 193 lb (87.5 kg)  10/05/21 201 lb (91.2 kg)     Objective:    Vital Signs:  BP (!) 165/96   Pulse 68   Ht 6' (1.829 m)   Wt 180 lb (81.6 kg)   BMI 24.41 kg/m    VITAL SIGNS:  reviewed NAD Answers questions appropriately Normal affect Remainder of physical examination not performed (telehealth visit; coronavirus pandemic)  ASSESSMENT & PLAN:    1 coronary artery disease-patient denies chest pain.  Continue present management.  He is not on a statin as he has declined in the past.  No aspirin given need for anticoagulation.  2 history of abdominal aortic aneurysm-The aneurysm was not visualized on most recent ultrasound and was technically limited.  We discussed repeating the ultrasound or CTA to further assess but he states he cannot lie flat and therefore the test will not be useful.  We therefore will be conservative at his request.  3 history of pulmonary emboli-patient has had recurrences.  Continue Xarelto.  4 hypertension-patient's blood pressure is elevated at home; resume olmesartan and follow blood pressure.  Check potassium, renal function and CBC in 1 week.  5 chronic dyspnea-felt to be secondary to a combination of deconditioning, restrictive lung disease and prior pulmonary emboli.  COVID-19 Education: The importance of social distancing was discussed today.  Time:   Today, I have spent 14 minutes with the patient with telehealth technology discussing the above problems.     Medication Adjustments/Labs and Tests Ordered: Current medicines are reviewed at length with the patient today.  Concerns regarding medicines are outlined above.   Tests Ordered: No orders of the defined types were placed in this encounter.   Medication Changes: No orders of the defined types were placed in this encounter.   Follow Up:  In Person in 1 year(s)  Signed, Olga Millers, MD  12/30/2022 1:34 PM    [facility]

## 2022-12-23 ENCOUNTER — Telehealth: Payer: Self-pay | Admitting: Cardiology

## 2022-12-23 NOTE — Telephone Encounter (Signed)
Spoke with pt wife, aware office visit has been changed to video visit through my chart.

## 2022-12-23 NOTE — Telephone Encounter (Signed)
Pt would like to know if upcoming appt is able to to be over the phone or by video? Pt states that he is not able to come into office unless he has an ambulance bring him, which he is not able to afford. Pt would like a callback regarding this matter . Please advise

## 2022-12-30 ENCOUNTER — Ambulatory Visit: Payer: PPO | Attending: Cardiology | Admitting: Cardiology

## 2022-12-30 ENCOUNTER — Encounter: Payer: Self-pay | Admitting: Cardiology

## 2022-12-30 VITALS — BP 165/96 | HR 68 | Ht 72.0 in | Wt 180.0 lb

## 2022-12-30 DIAGNOSIS — I2510 Atherosclerotic heart disease of native coronary artery without angina pectoris: Secondary | ICD-10-CM | POA: Diagnosis not present

## 2022-12-30 DIAGNOSIS — I10 Essential (primary) hypertension: Secondary | ICD-10-CM | POA: Diagnosis not present

## 2022-12-30 MED ORDER — OLMESARTAN MEDOXOMIL 20 MG PO TABS: 20.0000 mg | ORAL_TABLET | Freq: Every day | ORAL | 3 refills | Status: DC

## 2022-12-30 NOTE — Patient Instructions (Signed)
Medication Instructions:   START OLMESARTAN 20 MG ONCE DAILY  *If you need a refill on your cardiac medications before your next appointment, please call your pharmacy*   Lab Work:  Your physician recommends that you return for lab work in: ONE WEEK-DO NOT NEED TO FAST  If you have labs (blood work) drawn today and your tests are completely normal, you will receive your results only by: MyChart Message (if you have MyChart) OR A paper copy in the mail If you have any lab test that is abnormal or we need to change your treatment, we will call you to review the results.    Follow-Up: At [facility], you and your health needs are our priority.  As part of our continuing mission to provide you with exceptional heart care, we have created designated Provider Care Teams.  These Care Teams include your primary Cardiologist (physician) and Advanced Practice Providers (APPs -  Physician Assistants and Nurse Practitioners) who all work together to provide you with the care you need, when you need it.  We recommend signing up for the patient portal called "MyChart".  Sign up information is provided on this After Visit Summary.  MyChart is used to connect with patients for Virtual Visits (Telemedicine).  Patients are able to view lab/test results, encounter notes, upcoming appointments, etc.  Non-urgent messages can be sent to your provider as well.   To learn more about what you can do with MyChart, go to [email or web address].    Your next appointment:   12 month(s)  Provider:   Olga Millers, MD

## 2023-01-08 ENCOUNTER — Telehealth: Payer: Self-pay | Admitting: *Deleted

## 2023-01-08 ENCOUNTER — Encounter: Payer: Self-pay | Admitting: *Deleted

## 2023-01-08 NOTE — Telephone Encounter (Signed)
Patient is calling to speak with Gavin Pound due to receiving this message Please advise.

## 2023-01-08 NOTE — Telephone Encounter (Signed)
Patient called into the office, he had a virtual visit with dr Jens Som and he needs to have lab work drawn but reports he is not able to leave his home to get labs. He reports after walking 10 steps he gets weak and is afraid of falling. He asked if there is someone that can come out to the house to draw his blood. He does not have a wheelchair and has 1 step put of his house. Offered to help patient get a wheelchair, transportation and referral to remote health. Patient voiced understanding that Ellyn Hack, our social worker will reach out and help him get the support he is needing.

## 2023-01-09 ENCOUNTER — Telehealth (HOSPITAL_BASED_OUTPATIENT_CLINIC_OR_DEPARTMENT_OTHER): Payer: Self-pay | Admitting: Licensed Clinical Social Worker

## 2023-01-09 NOTE — Telephone Encounter (Signed)
H&V Care Navigation CSW Progress Note  Clinical Social Worker contacted patient by phone to f/u on referral. Full assessment note pending. Pt and I completed assessment. I discussed pt being connected to a wheelchair for safety/accessibility purposes. Pt adamant he does not need one despite only being able to walk 64ft per his report.   We discussed [facility] referral. Pt states "they're only going to come in and do what I want, right?" Shared that the program could answer specific questions but that they would assume care from PCP and complete all needed care in home, he would continue to see cardiology and gastroenterology. Pt very upset that he was being asked to change PCP providers despite not seeing him since 2023, and not being able to get out of the house to see any providers.   LCSW attempted to encourage pt to consider this, or wheelchair so he could get out of home to appts without ambulance transport. Pt not willing to consider either at this time, he wants to call [employer] and see what they can offer. He requests I do not follow up until Thursday after lunch. I will do so.   Patient is participating in a Managed Medicaid Plan:  No, [employer] only  SDOH Screenings   Food Insecurity: No Food Insecurity (01/09/2023)  Housing: Low Risk  (01/09/2023)  Transportation Needs: Unmet Transportation Needs (01/09/2023)  Utilities: Not At Risk (01/09/2023)  Financial Resource Strain: Low Risk  (01/09/2023)  Tobacco Use: Medium Risk (12/30/2022)    [name], MSW, LCSW Clinical Social Worker II [facility] Heart/Vascular Care Navigation  [phone]- work cell phone (preferred) [phone]- desk phone

## 2023-01-09 NOTE — Progress Notes (Signed)
Heart and Vascular Care Navigation  01/09/2023  [name] [birth year] [id number]  Reason for Referral: assistance with community resources, referral to [facility] for care Patient is participating in a Managed Medicaid Plan: No, [employer]  Engaged with patient by telephone for initial visit for Heart and Vascular Care Coordination.                                                                                                   Assessment:  LCSW unable to reach pt at [phone], had to leave voicemail which was returned by pt later that morning. Introduced self, role, reason for call. Pt confirmed home address, current PCP, insurance. Resides with his wife, has other family but not nearby, does not elaborate. He has been using a walker but states he can only walk about 43ft at a time. His wife has been the one ordering groceries and medications and leaving the house to curbside pick things up. They have one step out of the home. They denied any issues with affording or obtaining food, medications, housing. He is having issues with getting out of the home due to his above mobility issues.   We discussed obtaining a wheelchair so that he could utilize that to ride in a van to appts and use during longer walks. Pt declines. I discussed safety concerns and that not all of his care was going to be able to be completed via video conference but still he declines.   We discussed possibly making referral to [facility] which can assume primary care role and make referrals, as well as provide in home care for things such as lab draws and home visits. Pt declines changing his PCP, was disgruntled this was the option.  He states he will call his insurance and see what he can be eligible for. LCSW inquired if I could f/u next week and see if he had made any progress. He is agreeable to this- see below.                                      HRT/VAS Care Coordination     Patients Home  Cardiology Office [facility]   Outpatient Care Team Social Worker   Social Worker Name: Octavio Graves, [place], [phone]   Living arrangements for the past 2 months Apartment   Lives with: Spouse   Patient Current Insurance Coverage Managed Medicare  [employer]   Patient Has Concern With Paying Medical Bills No   Does Patient Have Prescription Coverage? Yes   Home Assistive Devices/Equipment Walker (specify type); Cane (specify quad or straight); Publishing rights manager Agency [facility]       Social History:  SDOH Screenings   Food Insecurity: No Food Insecurity (01/09/2023)  Housing: Low Risk  (01/09/2023)  Transportation Needs: Unmet Transportation Needs (01/09/2023)  Utilities: Not At Risk (01/09/2023)  Financial Resource Strain: Low Risk  (01/09/2023)  Tobacco Use: Medium Risk (12/30/2022)    SDOH Interventions: Financial Resources:  Financial Strain Interventions: Intervention Not Indicated  Food Insecurity:  Food Insecurity Interventions: Intervention Not Indicated  Housing Insecurity:  Housing Interventions: Intervention Not Indicated  Transportation:   Transportation Interventions: Payor Benefit, Other (Comment) (discussed if we could assist pt with wheelchair we can assist with wheelchair [place] service as needed)    Other Care Navigation Interventions:     Provided Pharmacy assistance resources  Pt denies any issues with obtaining or affording medications   Follow-up plan:   Pt states he will call his insurance about options he may have through coverage. He requests I call him back on 7/25 after lunchtime to f/u on any additional resources he may have accessed/offer assistance again.

## 2023-01-14 ENCOUNTER — Telehealth: Payer: Self-pay | Admitting: Cardiology

## 2023-01-14 NOTE — Telephone Encounter (Signed)
Patient would like to know if he can have home health draw labs at his house and drop them off at the office.

## 2023-01-14 NOTE — Telephone Encounter (Signed)
Call to patient and rings busy. Unable to LM

## 2023-01-15 NOTE — Telephone Encounter (Signed)
Patient returned RN's call and requested call back to [phone].

## 2023-01-15 NOTE — Telephone Encounter (Signed)
Call to Home and rings busy.  Call to secondary number and LM to call office.

## 2023-01-15 NOTE — Telephone Encounter (Signed)
Call to number requested, rings busy and then states call cannot be completed.  Call to Home number, LM detailed that if labs are drawn by [facility] they have to be taken to [facility], not our office.  Repeated message.

## 2023-01-21 ENCOUNTER — Telehealth: Payer: Self-pay | Admitting: Licensed Clinical Social Worker

## 2023-01-21 NOTE — Telephone Encounter (Signed)
Patient returned RN's call. 

## 2023-01-21 NOTE — Telephone Encounter (Signed)
Tried to return pt call. Home  number says can't be completed as dialed. Called the mobile number and reached the wife who states to call back in 30 mins.

## 2023-01-21 NOTE — Telephone Encounter (Signed)
H&V Care Navigation CSW Progress Note  Clinical Social Worker contacted patient by phone to f/u on assistance with commnity resources. Last time we spoke pt was going to contact his [employer] plan as he did not want to change his PCP to [facility] to take advantage of home bloodwork service and additional in home care. Pt also was refusing wheelchair.   When I called today to [phone] the phone rings with no voicemail. Pt returned my call this afternoon. States he has a list of information from [facility] but hasn't utilized it bc he is waiting to hear from Dr. Waunita Schooner team. I shared that it has been difficult to reach him but RN triage has attempted a few times. Inquired if he would like for me to try and connect him with triage nurse. He is agreeable. He spoke with Barth Kirks, triage LPN who provided clarfication that if he has home health they can complete draws and bring to [facility].   Pt does not have home health services at this time so asked Barth Kirks, LPN about wheelchair [place] services. I clarified again that pt does not have a wheelchair, personally offered to call PCP office to request this for pt, Medicare generally covers 1 wheelchair every 5 years if he meets criteria. Pt became upset stating that his wife would be very upset if he got a wheelchair, he has a walker and doesn't need anything else. I clarified however, that he can't get in and out of house with just the walker for extended periods. LCSW at this time remains limited in assistance if he will not accept wheelchair and does not have hands on assist to get him to and from appointment given mobility limitations.   Options remain as follows: - referral to [facility] to establish in home care with their team, meaning they would assume primary care. - allow [place] to order a wheelchair for pt to ensure he can utilize transportation services safely  There may be additional options on sheet provided by pt insurance plan  that I am not privy to, encouraged him to call them as able.  With pt permission I will route this to provider RN Stanton Kidney.   Patient is participating in a Managed Medicaid Plan:  No, [employer] Medicare only  SDOH Screenings   Food Insecurity: No Food Insecurity (01/09/2023)  Housing: Low Risk  (01/09/2023)  Transportation Needs: Unmet Transportation Needs (01/09/2023)  Utilities: Not At Risk (01/09/2023)  Financial Resource Strain: Low Risk  (01/09/2023)  Tobacco Use: Medium Risk (12/30/2022)   Octavio Graves, MSW, LCSW Clinical Social Worker II [facility] Health Heart/Vascular Care Navigation  [phone]- work cell phone (preferred) [phone]- desk phone

## 2023-01-21 NOTE — Telephone Encounter (Signed)
Patient called to [facility] and spoke with him.  Advised that he cannot have labs drawn somewhere and then bring them into our office. He does not have Home Health. He would need to come to our office or other [facility] location to have them done.  At this time returned back to [facility] for them to discuss transportation

## 2023-02-13 DIAGNOSIS — I2510 Atherosclerotic heart disease of native coronary artery without angina pectoris: Secondary | ICD-10-CM | POA: Diagnosis not present

## 2023-02-14 LAB — CBC
Hematocrit: 42 % (ref 37.5–51.0)
MCH: 33.3 pg — ABNORMAL HIGH (ref 26.6–33.0)
Platelets: 159 10*3/uL (ref 150–450)
WBC: 4.5 10*3/uL (ref 3.4–10.8)

## 2023-02-28 DIAGNOSIS — Z23 Encounter for immunization: Secondary | ICD-10-CM | POA: Diagnosis not present

## 2023-04-06 ENCOUNTER — Emergency Department (HOSPITAL_COMMUNITY): Payer: PPO

## 2023-04-06 ENCOUNTER — Encounter (HOSPITAL_COMMUNITY): Payer: Self-pay | Admitting: Emergency Medicine

## 2023-04-06 ENCOUNTER — Inpatient Hospital Stay (HOSPITAL_COMMUNITY)
Admission: EM | Admit: 2023-04-06 | Discharge: 2023-04-25 | DRG: 871 | Disposition: E | Payer: PPO | Attending: Internal Medicine | Admitting: Internal Medicine

## 2023-04-06 ENCOUNTER — Telehealth: Payer: PPO | Admitting: Nurse Practitioner

## 2023-04-06 DIAGNOSIS — R578 Other shock: Secondary | ICD-10-CM | POA: Diagnosis present

## 2023-04-06 DIAGNOSIS — G9341 Metabolic encephalopathy: Secondary | ICD-10-CM | POA: Diagnosis present

## 2023-04-06 DIAGNOSIS — Z8546 Personal history of malignant neoplasm of prostate: Secondary | ICD-10-CM

## 2023-04-06 DIAGNOSIS — R112 Nausea with vomiting, unspecified: Secondary | ICD-10-CM | POA: Diagnosis not present

## 2023-04-06 DIAGNOSIS — R000 Tachycardia, unspecified: Secondary | ICD-10-CM | POA: Diagnosis not present

## 2023-04-06 DIAGNOSIS — I7143 Infrarenal abdominal aortic aneurysm, without rupture: Secondary | ICD-10-CM | POA: Diagnosis present

## 2023-04-06 DIAGNOSIS — Z66 Do not resuscitate: Secondary | ICD-10-CM | POA: Diagnosis present

## 2023-04-06 DIAGNOSIS — K449 Diaphragmatic hernia without obstruction or gangrene: Secondary | ICD-10-CM | POA: Diagnosis present

## 2023-04-06 DIAGNOSIS — R1084 Generalized abdominal pain: Secondary | ICD-10-CM | POA: Diagnosis not present

## 2023-04-06 DIAGNOSIS — N170 Acute kidney failure with tubular necrosis: Secondary | ICD-10-CM | POA: Diagnosis present

## 2023-04-06 DIAGNOSIS — R1110 Vomiting, unspecified: Principal | ICD-10-CM

## 2023-04-06 DIAGNOSIS — J9601 Acute respiratory failure with hypoxia: Secondary | ICD-10-CM | POA: Diagnosis not present

## 2023-04-06 DIAGNOSIS — E8729 Other acidosis: Secondary | ICD-10-CM | POA: Diagnosis present

## 2023-04-06 DIAGNOSIS — R1111 Vomiting without nausea: Secondary | ICD-10-CM | POA: Diagnosis not present

## 2023-04-06 DIAGNOSIS — I959 Hypotension, unspecified: Secondary | ICD-10-CM | POA: Diagnosis not present

## 2023-04-06 DIAGNOSIS — Z86711 Personal history of pulmonary embolism: Secondary | ICD-10-CM

## 2023-04-06 DIAGNOSIS — K920 Hematemesis: Secondary | ICD-10-CM | POA: Diagnosis present

## 2023-04-06 DIAGNOSIS — R64 Cachexia: Secondary | ICD-10-CM | POA: Diagnosis present

## 2023-04-06 DIAGNOSIS — M40205 Unspecified kyphosis, thoracolumbar region: Secondary | ICD-10-CM | POA: Diagnosis present

## 2023-04-06 DIAGNOSIS — Z87891 Personal history of nicotine dependence: Secondary | ICD-10-CM

## 2023-04-06 DIAGNOSIS — I5032 Chronic diastolic (congestive) heart failure: Secondary | ICD-10-CM | POA: Diagnosis present

## 2023-04-06 DIAGNOSIS — I110 Hypertensive heart disease with heart failure: Secondary | ICD-10-CM | POA: Diagnosis present

## 2023-04-06 DIAGNOSIS — I462 Cardiac arrest due to underlying cardiac condition: Secondary | ICD-10-CM | POA: Diagnosis present

## 2023-04-06 DIAGNOSIS — I469 Cardiac arrest, cause unspecified: Secondary | ICD-10-CM | POA: Diagnosis not present

## 2023-04-06 DIAGNOSIS — Z6824 Body mass index (BMI) 24.0-24.9, adult: Secondary | ICD-10-CM

## 2023-04-06 DIAGNOSIS — I472 Ventricular tachycardia, unspecified: Secondary | ICD-10-CM | POA: Diagnosis present

## 2023-04-06 DIAGNOSIS — R110 Nausea: Secondary | ICD-10-CM | POA: Diagnosis not present

## 2023-04-06 DIAGNOSIS — Z79899 Other long term (current) drug therapy: Secondary | ICD-10-CM

## 2023-04-06 DIAGNOSIS — J9602 Acute respiratory failure with hypercapnia: Secondary | ICD-10-CM | POA: Diagnosis present

## 2023-04-06 DIAGNOSIS — I272 Pulmonary hypertension, unspecified: Secondary | ICD-10-CM | POA: Diagnosis present

## 2023-04-06 DIAGNOSIS — Z961 Presence of intraocular lens: Secondary | ICD-10-CM | POA: Diagnosis present

## 2023-04-06 DIAGNOSIS — Z86718 Personal history of other venous thrombosis and embolism: Secondary | ICD-10-CM

## 2023-04-06 DIAGNOSIS — I2510 Atherosclerotic heart disease of native coronary artery without angina pectoris: Secondary | ICD-10-CM | POA: Diagnosis present

## 2023-04-06 DIAGNOSIS — E872 Acidosis, unspecified: Secondary | ICD-10-CM

## 2023-04-06 DIAGNOSIS — E876 Hypokalemia: Secondary | ICD-10-CM | POA: Diagnosis present

## 2023-04-06 DIAGNOSIS — R6521 Severe sepsis with septic shock: Secondary | ICD-10-CM | POA: Diagnosis present

## 2023-04-06 DIAGNOSIS — K219 Gastro-esophageal reflux disease without esophagitis: Secondary | ICD-10-CM | POA: Diagnosis present

## 2023-04-06 DIAGNOSIS — R0602 Shortness of breath: Secondary | ICD-10-CM | POA: Diagnosis not present

## 2023-04-06 DIAGNOSIS — J690 Pneumonitis due to inhalation of food and vomit: Secondary | ICD-10-CM | POA: Diagnosis present

## 2023-04-06 DIAGNOSIS — I700 Atherosclerosis of aorta: Secondary | ICD-10-CM | POA: Diagnosis not present

## 2023-04-06 DIAGNOSIS — Z8249 Family history of ischemic heart disease and other diseases of the circulatory system: Secondary | ICD-10-CM

## 2023-04-06 DIAGNOSIS — A419 Sepsis, unspecified organism: Principal | ICD-10-CM | POA: Diagnosis present

## 2023-04-06 DIAGNOSIS — R918 Other nonspecific abnormal finding of lung field: Secondary | ICD-10-CM | POA: Diagnosis not present

## 2023-04-06 DIAGNOSIS — Z923 Personal history of irradiation: Secondary | ICD-10-CM

## 2023-04-06 DIAGNOSIS — Z515 Encounter for palliative care: Secondary | ICD-10-CM

## 2023-04-06 DIAGNOSIS — Z7901 Long term (current) use of anticoagulants: Secondary | ICD-10-CM

## 2023-04-06 LAB — CBC WITH DIFFERENTIAL/PLATELET
Abs Immature Granulocytes: 0.06 10*3/uL (ref 0.00–0.07)
Basophils Absolute: 0 10*3/uL (ref 0.0–0.1)
Basophils Relative: 0 %
Eosinophils Absolute: 0 10*3/uL (ref 0.0–0.5)
Eosinophils Relative: 0 %
HCT: 45.1 % (ref 39.0–52.0)
Hemoglobin: 15.6 g/dL (ref 13.0–17.0)
Immature Granulocytes: 1 %
Lymphocytes Relative: 4 %
Lymphs Abs: 0.5 10*3/uL — ABNORMAL LOW (ref 0.7–4.0)
MCH: 33 pg (ref 26.0–34.0)
MCHC: 34.6 g/dL (ref 30.0–36.0)
MCV: 95.3 fL (ref 80.0–100.0)
Monocytes Absolute: 1.3 10*3/uL — ABNORMAL HIGH (ref 0.1–1.0)
Monocytes Relative: 10 %
Neutro Abs: 11.4 10*3/uL — ABNORMAL HIGH (ref 1.7–7.7)
Neutrophils Relative %: 85 %
Platelets: 232 10*3/uL (ref 150–400)
RBC: 4.73 MIL/uL (ref 4.22–5.81)
RDW: 12.6 % (ref 11.5–15.5)
WBC: 13.3 10*3/uL — ABNORMAL HIGH (ref 4.0–10.5)
nRBC: 0 % (ref 0.0–0.2)

## 2023-04-06 LAB — COMPREHENSIVE METABOLIC PANEL
ALT: 18 U/L (ref 0–44)
AST: 35 U/L (ref 15–41)
Albumin: 4.5 g/dL (ref 3.5–5.0)
Alkaline Phosphatase: 57 U/L (ref 38–126)
Anion gap: 17 — ABNORMAL HIGH (ref 5–15)
BUN: 41 mg/dL — ABNORMAL HIGH (ref 8–23)
CO2: 29 mmol/L (ref 22–32)
Calcium: 9.3 mg/dL (ref 8.9–10.3)
Chloride: 97 mmol/L — ABNORMAL LOW (ref 98–111)
Creatinine, Ser: 1.66 mg/dL — ABNORMAL HIGH (ref 0.61–1.24)
GFR, Estimated: 41 mL/min — ABNORMAL LOW (ref >60)
Glucose, Bld: 198 mg/dL — ABNORMAL HIGH (ref 70–99)
Potassium: 3.2 mmol/L — ABNORMAL LOW (ref 3.5–5.1)
Sodium: 143 mmol/L (ref 135–145)
Total Bilirubin: 1.2 mg/dL (ref 0.3–1.2)
Total Protein: 7.6 g/dL (ref 6.5–8.1)

## 2023-04-06 LAB — I-STAT CHEM 8, ED
BUN: 43 mg/dL — ABNORMAL HIGH (ref 8–23)
Calcium, Ion: 0.99 mmol/L — ABNORMAL LOW (ref 1.15–1.40)
Chloride: 101 mmol/L (ref 98–111)
Creatinine, Ser: 1.7 mg/dL — ABNORMAL HIGH (ref 0.61–1.24)
Glucose, Bld: 201 mg/dL — ABNORMAL HIGH (ref 70–99)
HCT: 46 % (ref 39.0–52.0)
Hemoglobin: 15.6 g/dL (ref 13.0–17.0)
Potassium: 3 mmol/L — ABNORMAL LOW (ref 3.5–5.1)
Sodium: 144 mmol/L (ref 135–145)
TCO2: 30 mmol/L (ref 22–32)

## 2023-04-06 LAB — MAGNESIUM: Magnesium: 2.2 mg/dL (ref 1.7–2.4)

## 2023-04-06 LAB — I-STAT CG4 LACTIC ACID, ED: Lactic Acid, Venous: 4.4 mmol/L (ref 0.5–1.9)

## 2023-04-06 LAB — LIPASE, BLOOD: Lipase: 500 U/L — ABNORMAL HIGH (ref 11–51)

## 2023-04-06 MED ORDER — ETOMIDATE 2 MG/ML IV SOLN
INTRAVENOUS | Status: AC
  Filled 2023-04-06: qty 20

## 2023-04-06 MED ORDER — PANTOPRAZOLE SODIUM 40 MG IV SOLR
40.0000 mg | Freq: Once | INTRAVENOUS | Status: AC
  Administered 2023-04-06: 40 mg via INTRAVENOUS
  Filled 2023-04-06: qty 10

## 2023-04-06 MED ORDER — MORPHINE SULFATE (PF) 4 MG/ML IV SOLN: 4.0000 mg | Freq: Once | INTRAVENOUS | Status: DC

## 2023-04-06 MED ORDER — SODIUM CHLORIDE 0.9 % IV BOLUS
1000.0000 mL | Freq: Once | INTRAVENOUS | Status: AC
  Administered 2023-04-06: 1000 mL via INTRAVENOUS

## 2023-04-06 MED ORDER — PANTOPRAZOLE 80MG IVPB - SIMPLE MED
80.0000 mg | Freq: Once | INTRAVENOUS | Status: AC
  Administered 2023-04-07: 80 mg via INTRAVENOUS
  Filled 2023-04-06: qty 100

## 2023-04-06 MED ORDER — PANTOPRAZOLE INFUSION (NEW) - SIMPLE MED
8.0000 mg/h | INTRAVENOUS | Status: DC
  Administered 2023-04-07 (×2): 8 mg/h via INTRAVENOUS
  Filled 2023-04-06 (×3): qty 100

## 2023-04-06 MED ORDER — ONDANSETRON HCL 4 MG PO TABS: 4.0000 mg | ORAL_TABLET | Freq: Three times a day (TID) | ORAL | 0 refills | Status: DC | PRN

## 2023-04-06 MED ORDER — SODIUM CHLORIDE 0.9 % IV BOLUS
1000.0000 mL | Freq: Once | INTRAVENOUS | Status: AC
  Administered 2023-04-07: 1000 mL via INTRAVENOUS

## 2023-04-06 MED ORDER — ROCURONIUM BROMIDE 10 MG/ML (PF) SYRINGE
PREFILLED_SYRINGE | INTRAVENOUS | Status: AC
  Administered 2023-04-07: 100 mg
  Filled 2023-04-06: qty 10

## 2023-04-06 MED ORDER — PANTOPRAZOLE SODIUM 40 MG IV SOLR: 40.0000 mg | Freq: Two times a day (BID) | INTRAVENOUS | Status: DC

## 2023-04-06 MED ORDER — SUCCINYLCHOLINE CHLORIDE 200 MG/10ML IV SOSY
PREFILLED_SYRINGE | INTRAVENOUS | Status: AC
  Filled 2023-04-06: qty 10

## 2023-04-06 MED ORDER — POTASSIUM CHLORIDE 10 MEQ/100ML IV SOLN
10.0000 meq | Freq: Once | INTRAVENOUS | Status: AC
  Administered 2023-04-06: 10 meq via INTRAVENOUS
  Filled 2023-04-06: qty 100

## 2023-04-06 NOTE — ED Notes (Signed)
Ct called

## 2023-04-06 NOTE — Progress Notes (Signed)

## 2023-04-06 NOTE — ED Provider Notes (Signed)
Radiology: ordered.  Risk Prescription drug management.   82 year old male with past medical history and HPI as above.  On arrival, initial blood pressure was 67/55 with a respiratory rate of 23.  During my evaluation, patient is very ill-appearing and complaining of dizziness (systolic consistently greater than 90).  IV fluids were promptly started with improvement in blood pressure and dizziness.  However, patient remained ill-appearing.  He was given Zofran with EMS and did not exhibit any vomiting during my first interaction, but patient reportedly had what sounds like coffee-ground emesis at home.  Fortunately, no known history of liver disease or variceal disease, but we will nevertheless go ahead with Protonix as he does have a history of GERD.  Patient's vomiting may be secondary to gastroenteritis, but he has not had a diarrheal component.  Nor has he had any fevers.  Given his hypotension, ill appearance, and comorbidities including abdominal aortic aneurysm, will proceed with broad laboratory workup as well as imaging evaluation.  Imaging will include CT head to ensure that there is no bleed triggering his recurrent vomiting.  Will also obtain CT dissection protocol of the chest, abdomen, pelvis to assess for signs of Boerhaave's, Mallory-Weiss, AAA progression/rupture.  This will also evaluate the lungs for evidence of aspiration versus new pneumonia.  Laboratory workup reviewed.  Patient does have a lactic acidosis of 4.4.  On metabolic panel, potassium is mildly low at 3.2 for which he was given replacement.  He also has an AKI  with a creatinine of 1.66; this correlates with his concern for aortic clinical dehydration.  He does have a mild leukocytosis of 13.3 with neutrophilic predominance.  Of note, patient's lipase is 500. He does have epigastric pain that may coincide with a diagnosis of pancreatitis.  At time of signout, patient's dissection CT scan remains pending.  Blood pressure has improved, but nevertheless feel that the scan is pertinent given his hypotension, coffee-ground emesis, and lactic acidosis.  Oncoming ED team is aware of patient's ED course and plan to follow-up on CT scan and admit.   Final Clinical Impression(s) / ED Diagnoses Final diagnoses:  Vomiting in adult    Rx / DC Orders ED Discharge Orders     None         Lyman Speller, MD 04/08/2023 2329    Charlynne Pander, MD 04/10/2023 2248  is no abdominal tenderness. There is no guarding or rebound.  Musculoskeletal:     Right lower leg: No edema.     Left lower leg: No edema.  Skin:    General: Skin is dry.     Comments: Cool, diaphoretic  Neurological:     Mental Status: He is alert. Mental status is at baseline.     Sensory: No sensory deficit.     ED Results / Procedures / Treatments   Labs (all labs ordered are listed, but only abnormal results are displayed) Labs Reviewed  CBC WITH DIFFERENTIAL/PLATELET - Abnormal; Notable for the following components:      Result Value   WBC 13.3 (*)    Neutro Abs 11.4 (*)    Lymphs Abs 0.5 (*)    Monocytes Absolute 1.3 (*)    All other components within normal limits  COMPREHENSIVE METABOLIC PANEL - Abnormal;  Notable for the following components:   Potassium 3.2 (*)    Chloride 97 (*)    Glucose, Bld 198 (*)    BUN 41 (*)    Creatinine, Ser 1.66 (*)    GFR, Estimated 41 (*)    Anion gap 17 (*)    All other components within normal limits  LIPASE, BLOOD - Abnormal; Notable for the following components:   Lipase 500 (*)    All other components within normal limits  I-STAT CHEM 8, ED - Abnormal; Notable for the following components:   Potassium 3.0 (*)    BUN 43 (*)    Creatinine, Ser 1.70 (*)    Glucose, Bld 201 (*)    Calcium, Ion 0.99 (*)    All other components within normal limits  I-STAT CG4 LACTIC ACID, ED - Abnormal; Notable for the following components:   Lactic Acid, Venous 4.4 (*)    All other components within normal limits  MAGNESIUM  I-STAT CHEM 8, ED    EKG EKG Interpretation Date/Time:  Sunday April 06 2023 21:04:44 EDT Ventricular Rate:  94 PR Interval:  167 QRS Duration:  167 QT Interval:  426 QTC Calculation: 533 R Axis:   -80  Text Interpretation: Sinus tachycardia Multiple premature complexes, vent & supraven Probable left atrial enlargement RBBB and LAFB No significant change since last tracing Confirmed by Richardean Canal [phone]) on 04/04/2023 9:06:49 PM  Radiology DG Chest Portable 1 View  Result Date: 04/14/2023 CLINICAL DATA:  Shortness of breath EXAM: PORTABLE CHEST 1 VIEW COMPARISON:  08/24/2021, 05/25/2014 FINDINGS: Hypoventilatory changes with atelectasis or scarring at the bases. Moderate to large hiatal hernia. Aortic atherosclerosis. No pneumothorax. Suspected emphysema with possible bola at the right apex IMPRESSION: 1. Hypoventilatory changes with atelectasis or scarring at the bases. 2. Moderate to large hiatal hernia. 3. Possible bulla at the right apex Electronically Signed   By: Jasmine Pang M.D.   On: 04/21/2023 21:32    Procedures Procedures    Medications Ordered in ED Medications  potassium chloride 10 mEq in 100 mL IVPB (10 mEq  Intravenous New Bag/Given 04/10/2023 2254)  pantoprazole (PROTONIX) injection 40 mg (40 mg Intravenous Given 03/27/2023 2138)  sodium chloride 0.9 % bolus 1,000 mL (0 mLs Intravenous Stopped 04/17/2023 2144)    ED Course/ Medical Decision Making/ A&P                                 Medical Decision Making Amount and/or Complexity of Data Reviewed Labs: ordered.  Radiology: ordered.  Risk Prescription drug management.   82 year old male with past medical history and HPI as above.  On arrival, initial blood pressure was 67/55 with a respiratory rate of 23.  During my evaluation, patient is very ill-appearing and complaining of dizziness (systolic consistently greater than 90).  IV fluids were promptly started with improvement in blood pressure and dizziness.  However, patient remained ill-appearing.  He was given Zofran with EMS and did not exhibit any vomiting during my first interaction, but patient reportedly had what sounds like coffee-ground emesis at home.  Fortunately, no known history of liver disease or variceal disease, but we will nevertheless go ahead with Protonix as he does have a history of GERD.  Patient's vomiting may be secondary to gastroenteritis, but he has not had a diarrheal component.  Nor has he had any fevers.  Given his hypotension, ill appearance, and comorbidities including abdominal aortic aneurysm, will proceed with broad laboratory workup as well as imaging evaluation.  Imaging will include CT head to ensure that there is no bleed triggering his recurrent vomiting.  Will also obtain CT dissection protocol of the chest, abdomen, pelvis to assess for signs of Boerhaave's, Mallory-Weiss, AAA progression/rupture.  This will also evaluate the lungs for evidence of aspiration versus new pneumonia.  Laboratory workup reviewed.  Patient does have a lactic acidosis of 4.4.  On metabolic panel, potassium is mildly low at 3.2 for which he was given replacement.  He also has an AKI  with a creatinine of 1.66; this correlates with his concern for aortic clinical dehydration.  He does have a mild leukocytosis of 13.3 with neutrophilic predominance.  Of note, patient's lipase is 500. He does have epigastric pain that may coincide with a diagnosis of pancreatitis.  At time of signout, patient's dissection CT scan remains pending.  Blood pressure has improved, but nevertheless feel that the scan is pertinent given his hypotension, coffee-ground emesis, and lactic acidosis.  Oncoming ED team is aware of patient's ED course and plan to follow-up on CT scan and admit.   Final Clinical Impression(s) / ED Diagnoses Final diagnoses:  Vomiting in adult    Rx / DC Orders ED Discharge Orders     None         Lyman Speller, MD 04/08/2023 2329    Charlynne Pander, MD 04/10/2023 2248

## 2023-04-06 NOTE — ED Notes (Signed)
Ct called

## 2023-04-06 NOTE — ED Triage Notes (Addendum)
PT from home where he has been vomiting for since last night after dinner and has not stopped. No diarrhea- 2 normal stools today. EMS gave 200 NS and 4 zofran IV. PT doing better. RBBB. BP 82/40 and 76/52 and HR 110s when EMS arrived. Now 102 systolic.

## 2023-04-06 NOTE — Progress Notes (Signed)
I have spent 5 minutes in review of e-visit questionnaire, review and updating patient chart, medical decision making and response to patient.  ° °Jerrell Mangel W Secilia Apps, NP ° °  °

## 2023-04-07 ENCOUNTER — Inpatient Hospital Stay (HOSPITAL_COMMUNITY): Payer: PPO

## 2023-04-07 ENCOUNTER — Emergency Department (HOSPITAL_COMMUNITY): Payer: PPO

## 2023-04-07 DIAGNOSIS — Z86718 Personal history of other venous thrombosis and embolism: Secondary | ICD-10-CM | POA: Diagnosis not present

## 2023-04-07 DIAGNOSIS — R6521 Severe sepsis with septic shock: Secondary | ICD-10-CM | POA: Diagnosis not present

## 2023-04-07 DIAGNOSIS — Z9911 Dependence on respirator [ventilator] status: Secondary | ICD-10-CM | POA: Diagnosis not present

## 2023-04-07 DIAGNOSIS — R64 Cachexia: Secondary | ICD-10-CM | POA: Diagnosis not present

## 2023-04-07 DIAGNOSIS — I472 Ventricular tachycardia, unspecified: Secondary | ICD-10-CM | POA: Diagnosis not present

## 2023-04-07 DIAGNOSIS — N170 Acute kidney failure with tubular necrosis: Secondary | ICD-10-CM | POA: Diagnosis not present

## 2023-04-07 DIAGNOSIS — Q272 Other congenital malformations of renal artery: Secondary | ICD-10-CM | POA: Diagnosis not present

## 2023-04-07 DIAGNOSIS — I469 Cardiac arrest, cause unspecified: Secondary | ICD-10-CM

## 2023-04-07 DIAGNOSIS — Z452 Encounter for adjustment and management of vascular access device: Secondary | ICD-10-CM | POA: Diagnosis not present

## 2023-04-07 DIAGNOSIS — I700 Atherosclerosis of aorta: Secondary | ICD-10-CM | POA: Diagnosis not present

## 2023-04-07 DIAGNOSIS — R1110 Vomiting, unspecified: Secondary | ICD-10-CM | POA: Diagnosis not present

## 2023-04-07 DIAGNOSIS — K2980 Duodenitis without bleeding: Secondary | ICD-10-CM | POA: Diagnosis not present

## 2023-04-07 DIAGNOSIS — I462 Cardiac arrest due to underlying cardiac condition: Secondary | ICD-10-CM | POA: Diagnosis not present

## 2023-04-07 DIAGNOSIS — R0989 Other specified symptoms and signs involving the circulatory and respiratory systems: Secondary | ICD-10-CM | POA: Diagnosis not present

## 2023-04-07 DIAGNOSIS — Z923 Personal history of irradiation: Secondary | ICD-10-CM | POA: Diagnosis not present

## 2023-04-07 DIAGNOSIS — I5032 Chronic diastolic (congestive) heart failure: Secondary | ICD-10-CM | POA: Diagnosis not present

## 2023-04-07 DIAGNOSIS — G9341 Metabolic encephalopathy: Secondary | ICD-10-CM | POA: Diagnosis not present

## 2023-04-07 DIAGNOSIS — Z87891 Personal history of nicotine dependence: Secondary | ICD-10-CM | POA: Diagnosis not present

## 2023-04-07 DIAGNOSIS — R0902 Hypoxemia: Secondary | ICD-10-CM | POA: Diagnosis not present

## 2023-04-07 DIAGNOSIS — R140 Abdominal distension (gaseous): Secondary | ICD-10-CM | POA: Diagnosis not present

## 2023-04-07 DIAGNOSIS — J9602 Acute respiratory failure with hypercapnia: Secondary | ICD-10-CM | POA: Diagnosis not present

## 2023-04-07 DIAGNOSIS — Z4682 Encounter for fitting and adjustment of non-vascular catheter: Secondary | ICD-10-CM | POA: Diagnosis not present

## 2023-04-07 DIAGNOSIS — R579 Shock, unspecified: Secondary | ICD-10-CM | POA: Diagnosis not present

## 2023-04-07 DIAGNOSIS — E876 Hypokalemia: Secondary | ICD-10-CM | POA: Diagnosis not present

## 2023-04-07 DIAGNOSIS — Z515 Encounter for palliative care: Secondary | ICD-10-CM | POA: Diagnosis not present

## 2023-04-07 DIAGNOSIS — Z66 Do not resuscitate: Secondary | ICD-10-CM | POA: Diagnosis not present

## 2023-04-07 DIAGNOSIS — K922 Gastrointestinal hemorrhage, unspecified: Secondary | ICD-10-CM

## 2023-04-07 DIAGNOSIS — K920 Hematemesis: Secondary | ICD-10-CM | POA: Diagnosis not present

## 2023-04-07 DIAGNOSIS — R4182 Altered mental status, unspecified: Secondary | ICD-10-CM | POA: Diagnosis not present

## 2023-04-07 DIAGNOSIS — J9811 Atelectasis: Secondary | ICD-10-CM | POA: Diagnosis not present

## 2023-04-07 DIAGNOSIS — R918 Other nonspecific abnormal finding of lung field: Secondary | ICD-10-CM | POA: Diagnosis not present

## 2023-04-07 DIAGNOSIS — I110 Hypertensive heart disease with heart failure: Secondary | ICD-10-CM | POA: Diagnosis not present

## 2023-04-07 DIAGNOSIS — J9601 Acute respiratory failure with hypoxia: Secondary | ICD-10-CM

## 2023-04-07 DIAGNOSIS — E8729 Other acidosis: Secondary | ICD-10-CM | POA: Diagnosis not present

## 2023-04-07 DIAGNOSIS — I272 Pulmonary hypertension, unspecified: Secondary | ICD-10-CM | POA: Diagnosis not present

## 2023-04-07 DIAGNOSIS — I7143 Infrarenal abdominal aortic aneurysm, without rupture: Secondary | ICD-10-CM | POA: Diagnosis not present

## 2023-04-07 DIAGNOSIS — J690 Pneumonitis due to inhalation of food and vomit: Secondary | ICD-10-CM | POA: Diagnosis not present

## 2023-04-07 DIAGNOSIS — R578 Other shock: Secondary | ICD-10-CM

## 2023-04-07 DIAGNOSIS — R569 Unspecified convulsions: Secondary | ICD-10-CM

## 2023-04-07 DIAGNOSIS — A419 Sepsis, unspecified organism: Secondary | ICD-10-CM | POA: Diagnosis not present

## 2023-04-07 LAB — POCT I-STAT 7, (LYTES, BLD GAS, ICA,H+H)
Acid-Base Excess: 3 mmol/L — ABNORMAL HIGH (ref 0.0–2.0)
Bicarbonate: 29.3 mmol/L — ABNORMAL HIGH (ref 20.0–28.0)
Calcium, Ion: 1.05 mmol/L — ABNORMAL LOW (ref 1.15–1.40)
HCT: 46 % (ref 39.0–52.0)
Hemoglobin: 15.6 g/dL (ref 13.0–17.0)
O2 Saturation: 80 %
Patient temperature: 37.5
Potassium: 2.9 mmol/L — ABNORMAL LOW (ref 3.5–5.1)
Sodium: 146 mmol/L — ABNORMAL HIGH (ref 135–145)
TCO2: 31 mmol/L (ref 22–32)
pCO2 arterial: 51.3 mm[Hg] — ABNORMAL HIGH (ref 32–48)
pH, Arterial: 7.367 (ref 7.35–7.45)
pO2, Arterial: 48 mm[Hg] — ABNORMAL LOW (ref 83–108)

## 2023-04-07 LAB — CBG MONITORING, ED: Glucose-Capillary: 177 mg/dL — ABNORMAL HIGH (ref 70–99)

## 2023-04-07 LAB — CBC WITH DIFFERENTIAL/PLATELET
Abs Immature Granulocytes: 0.04 10*3/uL (ref 0.00–0.07)
Basophils Absolute: 0 10*3/uL (ref 0.0–0.1)
Basophils Relative: 0 %
Eosinophils Absolute: 0 10*3/uL (ref 0.0–0.5)
Eosinophils Relative: 0 %
HCT: 47.4 % (ref 39.0–52.0)
Hemoglobin: 16 g/dL (ref 13.0–17.0)
Immature Granulocytes: 1 %
Lymphocytes Relative: 7 %
Lymphs Abs: 0.5 10*3/uL — ABNORMAL LOW (ref 0.7–4.0)
MCH: 33.3 pg (ref 26.0–34.0)
MCHC: 33.8 g/dL (ref 30.0–36.0)
MCV: 98.5 fL (ref 80.0–100.0)
Monocytes Absolute: 0.1 10*3/uL (ref 0.1–1.0)
Monocytes Relative: 2 %
Neutro Abs: 6.7 10*3/uL (ref 1.7–7.7)
Neutrophils Relative %: 90 %
Platelets: 181 10*3/uL (ref 150–400)
RBC: 4.81 MIL/uL (ref 4.22–5.81)
RDW: 13.3 % (ref 11.5–15.5)
WBC: 7.4 10*3/uL (ref 4.0–10.5)
nRBC: 0 % (ref 0.0–0.2)

## 2023-04-07 LAB — BASIC METABOLIC PANEL
Anion gap: 18 — ABNORMAL HIGH (ref 5–15)
Anion gap: 15 (ref 5–15)
BUN: 40 mg/dL — ABNORMAL HIGH (ref 8–23)
BUN: 41 mg/dL — ABNORMAL HIGH (ref 8–23)
CO2: 24 mmol/L (ref 22–32)
CO2: 26 mmol/L (ref 22–32)
Calcium: 8.2 mg/dL — ABNORMAL LOW (ref 8.9–10.3)
Calcium: 8.6 mg/dL — ABNORMAL LOW (ref 8.9–10.3)
Chloride: 102 mmol/L (ref 98–111)
Chloride: 105 mmol/L (ref 98–111)
Creatinine, Ser: 1.96 mg/dL — ABNORMAL HIGH (ref 0.61–1.24)
Creatinine, Ser: 2.15 mg/dL — ABNORMAL HIGH (ref 0.61–1.24)
GFR, Estimated: 33 mL/min — ABNORMAL LOW (ref >60)
GFR, Estimated: 30 mL/min — ABNORMAL LOW (ref >60)
Glucose, Bld: 231 mg/dL — ABNORMAL HIGH (ref 70–99)
Glucose, Bld: 146 mg/dL — ABNORMAL HIGH (ref 70–99)
Potassium: 2.7 mmol/L — CL (ref 3.5–5.1)
Potassium: 2.8 mmol/L — ABNORMAL LOW (ref 3.5–5.1)
Sodium: 144 mmol/L (ref 135–145)
Sodium: 146 mmol/L — ABNORMAL HIGH (ref 135–145)

## 2023-04-07 LAB — I-STAT ARTERIAL BLOOD GAS, ED
Acid-Base Excess: 5 mmol/L — ABNORMAL HIGH (ref 0.0–2.0)
Acid-base deficit: 1 mmol/L (ref 0.0–2.0)
Acid-base deficit: 3 mmol/L — ABNORMAL HIGH (ref 0.0–2.0)
Bicarbonate: 32.2 mmol/L — ABNORMAL HIGH (ref 20.0–28.0)
Bicarbonate: 27.5 mmol/L (ref 20.0–28.0)
Bicarbonate: 30.3 mmol/L — ABNORMAL HIGH (ref 20.0–28.0)
Calcium, Ion: 1.05 mmol/L — ABNORMAL LOW (ref 1.15–1.40)
Calcium, Ion: 0.98 mmol/L — ABNORMAL LOW (ref 1.15–1.40)
Calcium, Ion: 1.04 mmol/L — ABNORMAL LOW (ref 1.15–1.40)
HCT: 44 % (ref 39.0–52.0)
HCT: 46 % (ref 39.0–52.0)
HCT: 54 % — ABNORMAL HIGH (ref 39.0–52.0)
Hemoglobin: 15 g/dL (ref 13.0–17.0)
Hemoglobin: 15.6 g/dL (ref 13.0–17.0)
Hemoglobin: 18.4 g/dL — ABNORMAL HIGH (ref 13.0–17.0)
O2 Saturation: 100 %
O2 Saturation: 12 %
O2 Saturation: 67 %
Patient temperature: 94.9
Patient temperature: 97.8
Patient temperature: 98.1
Potassium: 2.5 mmol/L — CL (ref 3.5–5.1)
Potassium: 2.9 mmol/L — ABNORMAL LOW (ref 3.5–5.1)
Potassium: 3.1 mmol/L — ABNORMAL LOW (ref 3.5–5.1)
Sodium: 147 mmol/L — ABNORMAL HIGH (ref 135–145)
Sodium: 143 mmol/L (ref 135–145)
Sodium: 144 mmol/L (ref 135–145)
TCO2: 34 mmol/L — ABNORMAL HIGH (ref 22–32)
TCO2: 29 mmol/L (ref 22–32)
TCO2: 33 mmol/L — ABNORMAL HIGH (ref 22–32)
pCO2 arterial: 52.8 mm[Hg] — ABNORMAL HIGH (ref 32–48)
pCO2 arterial: 57.9 mm[Hg] — ABNORMAL HIGH (ref 32–48)
pCO2 arterial: 88.3 mm[Hg] (ref 32–48)
pH, Arterial: 7.384 (ref 7.35–7.45)
pH, Arterial: 7.141 — CL (ref 7.35–7.45)
pH, Arterial: 7.283 — ABNORMAL LOW (ref 7.35–7.45)
pO2, Arterial: 228 mm[Hg] — ABNORMAL HIGH (ref 83–108)
pO2, Arterial: 40 mm[Hg] — CL (ref 83–108)
pO2, Arterial: <15 mm[Hg] — CL (ref 83–108)

## 2023-04-07 LAB — MAGNESIUM: Magnesium: 1.7 mg/dL (ref 1.7–2.4)

## 2023-04-07 LAB — TROPONIN I (HIGH SENSITIVITY): Troponin I (High Sensitivity): 78 ng/L — ABNORMAL HIGH (ref <18)

## 2023-04-07 LAB — URINALYSIS, ROUTINE W REFLEX MICROSCOPIC
Bacteria, UA: NONE SEEN
Bilirubin Urine: NEGATIVE
Glucose, UA: 50 mg/dL — AB
Ketones, ur: 5 mg/dL — AB
Leukocytes,Ua: NEGATIVE
Nitrite: NEGATIVE
Protein, ur: 100 mg/dL — AB
RBC / HPF: >50 RBC/hpf (ref 0–5)
Specific Gravity, Urine: 1.023 (ref 1.005–1.030)
pH: 5 (ref 5.0–8.0)

## 2023-04-07 LAB — I-STAT VENOUS BLOOD GAS, ED
Acid-base deficit: 5 mmol/L — ABNORMAL HIGH (ref 0.0–2.0)
Bicarbonate: 28 mmol/L (ref 20.0–28.0)
Calcium, Ion: 1.06 mmol/L — ABNORMAL LOW (ref 1.15–1.40)
HCT: 54 % — ABNORMAL HIGH (ref 39.0–52.0)
Hemoglobin: 18.4 g/dL — ABNORMAL HIGH (ref 13.0–17.0)
O2 Saturation: 18 %
Patient temperature: 35.6
Potassium: 2.8 mmol/L — ABNORMAL LOW (ref 3.5–5.1)
Sodium: 145 mmol/L (ref 135–145)
TCO2: 31 mmol/L (ref 22–32)
pCO2, Ven: 79.9 mm[Hg] (ref 44–60)
pH, Ven: 7.143 — CL (ref 7.25–7.43)
pO2, Ven: 18 mm[Hg] — CL (ref 32–45)

## 2023-04-07 LAB — PROTIME-INR
INR: 1.1 (ref 0.8–1.2)
Prothrombin Time: 13.9 s (ref 11.4–15.2)

## 2023-04-07 LAB — HEMOGLOBIN AND HEMATOCRIT, BLOOD
HCT: 48.5 % (ref 39.0–52.0)
Hemoglobin: 16.2 g/dL (ref 13.0–17.0)

## 2023-04-07 LAB — GLUCOSE, CAPILLARY
Glucose-Capillary: 137 mg/dL — ABNORMAL HIGH (ref 70–99)
Glucose-Capillary: 157 mg/dL — ABNORMAL HIGH (ref 70–99)

## 2023-04-07 LAB — HEMOGLOBIN A1C
Hgb A1c MFr Bld: 4.9 % (ref 4.8–5.6)
Mean Plasma Glucose: 93.93 mg/dL

## 2023-04-07 LAB — MRSA NEXT GEN BY PCR, NASAL: MRSA by PCR Next Gen: NOT DETECTED

## 2023-04-07 LAB — PHOSPHORUS: Phosphorus: 5.7 mg/dL — ABNORMAL HIGH (ref 2.5–4.6)

## 2023-04-07 LAB — LACTIC ACID, PLASMA: Lactic Acid, Venous: 4.2 mmol/L (ref 0.5–1.9)

## 2023-04-07 LAB — PREPARE RBC (CROSSMATCH)

## 2023-04-07 MED ORDER — CALCIUM GLUCONATE-NACL 2-0.675 GM/100ML-% IV SOLN
2.0000 g | Freq: Once | INTRAVENOUS | Status: AC
  Administered 2023-04-07: 2000 mg via INTRAVENOUS
  Filled 2023-04-07 (×2): qty 100

## 2023-04-07 MED ORDER — MAGNESIUM SULFATE 50 % IJ SOLN
3.0000 g | Freq: Once | INTRAVENOUS | Status: AC
  Administered 2023-04-07: 3 g via INTRAVENOUS
  Filled 2023-04-07: qty 6

## 2023-04-07 MED ORDER — ETOMIDATE 2 MG/ML IV SOLN
INTRAVENOUS | Status: AC
  Administered 2023-04-07: 20 mg
  Filled 2023-04-07: qty 10

## 2023-04-07 MED ORDER — ROCURONIUM BROMIDE 10 MG/ML (PF) SYRINGE
PREFILLED_SYRINGE | INTRAVENOUS | Status: AC
  Administered 2023-04-07: 100 mg
  Filled 2023-04-07: qty 10

## 2023-04-07 MED ORDER — SODIUM CHLORIDE 0.9 % IV SOLN: INTRAVENOUS | Status: DC | PRN

## 2023-04-07 MED ORDER — IOHEXOL 350 MG/ML SOLN
75.0000 mL | Freq: Once | INTRAVENOUS | Status: AC | PRN
  Administered 2023-04-07: 75 mL via INTRAVENOUS

## 2023-04-07 MED ORDER — SODIUM CHLORIDE 0.9 % IV SOLN: 3.0000 g | Freq: Four times a day (QID) | INTRAVENOUS | Status: DC

## 2023-04-07 MED ORDER — AMIODARONE HCL IN DEXTROSE 360-4.14 MG/200ML-% IV SOLN: 30.0000 mg/h | INTRAVENOUS | Status: DC

## 2023-04-07 MED ORDER — ORAL CARE MOUTH RINSE
15.0000 mL | OROMUCOSAL | Status: DC
  Administered 2023-04-07: 15 mL via OROMUCOSAL

## 2023-04-07 MED ORDER — POTASSIUM CHLORIDE 10 MEQ/100ML IV SOLN
INTRAVENOUS | Status: AC
  Administered 2023-04-07: 10 meq via INTRAVENOUS
  Filled 2023-04-07: qty 100

## 2023-04-07 MED ORDER — CALCIUM GLUCONATE-NACL 1-0.675 GM/50ML-% IV SOLN: 1.0000 g | Freq: Once | INTRAVENOUS | Status: DC

## 2023-04-07 MED ORDER — POTASSIUM CHLORIDE 10 MEQ/50ML IV SOLN
10.0000 meq | INTRAVENOUS | Status: AC
  Administered 2023-04-07 (×2): 10 meq via INTRAVENOUS
  Filled 2023-04-07 (×2): qty 50

## 2023-04-07 MED ORDER — SODIUM BICARBONATE 8.4 % IV SOLN
100.0000 meq | Freq: Once | INTRAVENOUS | Status: AC
  Administered 2023-04-07: 100 meq via INTRAVENOUS

## 2023-04-07 MED ORDER — FENTANYL CITRATE PF 50 MCG/ML IJ SOSY: 25.0000 ug | PREFILLED_SYRINGE | INTRAMUSCULAR | Status: DC | PRN

## 2023-04-07 MED ORDER — NOREPINEPHRINE 16 MG/250ML-% IV SOLN
0.0000 ug/min | INTRAVENOUS | Status: DC
  Administered 2023-04-07: 50 ug/min via INTRAVENOUS
  Administered 2023-04-07: 40 ug/min via INTRAVENOUS
  Administered 2023-04-07: 70 ug/min via INTRAVENOUS
  Filled 2023-04-07 (×2): qty 250

## 2023-04-07 MED ORDER — STERILE WATER FOR INJECTION IJ SOLN
50.0000 ng/kg/min | INTRAVENOUS | Status: DC
  Administered 2023-04-07: 50 ng/kg/min via RESPIRATORY_TRACT
  Filled 2023-04-07 (×3): qty 5

## 2023-04-07 MED ORDER — SODIUM BICARBONATE 8.4 % IV SOLN: INTRAVENOUS | Status: DC

## 2023-04-07 MED ORDER — INSULIN ASPART 100 UNIT/ML IJ SOLN
0.0000 [IU] | INTRAMUSCULAR | Status: DC
  Administered 2023-04-07: 1 [IU] via SUBCUTANEOUS

## 2023-04-07 MED ORDER — CALCIUM GLUCONATE-NACL 1-0.675 GM/50ML-% IV SOLN
1.0000 g | Freq: Once | INTRAVENOUS | Status: AC
  Administered 2023-04-07: 1000 mg via INTRAVENOUS

## 2023-04-07 MED ORDER — AMIODARONE HCL IN DEXTROSE 360-4.14 MG/200ML-% IV SOLN: 60.0000 mg/h | INTRAVENOUS | Status: DC

## 2023-04-07 MED ORDER — NOREPINEPHRINE 4 MG/250ML-% IV SOLN
5.0000 ug/min | INTRAVENOUS | Status: DC
  Administered 2023-04-07: 35 ug/min via INTRAVENOUS
  Filled 2023-04-07: qty 250

## 2023-04-07 MED ORDER — SODIUM CHLORIDE 0.9 % IV SOLN
250.0000 mL | INTRAVENOUS | Status: DC
  Administered 2023-04-07: 250 mL via INTRAVENOUS

## 2023-04-07 MED ORDER — CHLORHEXIDINE GLUCONATE CLOTH 2 % EX PADS: 6.0000 | MEDICATED_PAD | Freq: Every day | CUTANEOUS | Status: DC

## 2023-04-07 MED ORDER — SODIUM CHLORIDE 0.9% IV SOLUTION: Freq: Once | INTRAVENOUS | Status: DC

## 2023-04-07 MED ORDER — MIDAZOLAM BOLUS VIA INFUSION
0.0000 mg | INTRAVENOUS | Status: DC | PRN
  Administered 2023-04-07: 1 mg via INTRAVENOUS

## 2023-04-07 MED ORDER — NOREPINEPHRINE 4 MG/250ML-% IV SOLN: 0.0000 ug/min | INTRAVENOUS | Status: DC

## 2023-04-07 MED ORDER — ACETAMINOPHEN 650 MG RE SUPP: 650.0000 mg | Freq: Four times a day (QID) | RECTAL | Status: DC | PRN

## 2023-04-07 MED ORDER — GLYCOPYRROLATE 1 MG PO TABS: 1.0000 mg | ORAL_TABLET | ORAL | Status: DC | PRN

## 2023-04-07 MED ORDER — POTASSIUM CHLORIDE 10 MEQ/50ML IV SOLN
10.0000 meq | INTRAVENOUS | Status: DC
  Administered 2023-04-07: 10 meq via INTRAVENOUS
  Filled 2023-04-07 (×5): qty 50

## 2023-04-07 MED ORDER — MORPHINE SULFATE (PF) 2 MG/ML IV SOLN
2.0000 mg | INTRAVENOUS | Status: DC | PRN
  Administered 2023-04-07: 2 mg via INTRAVENOUS
  Filled 2023-04-07: qty 1

## 2023-04-07 MED ORDER — ORAL CARE MOUTH RINSE: 15.0000 mL | OROMUCOSAL | Status: DC | PRN

## 2023-04-07 MED ORDER — GLYCOPYRROLATE 0.2 MG/ML IJ SOLN
0.2000 mg | INTRAMUSCULAR | Status: DC | PRN
  Administered 2023-04-07: 0.2 mg via INTRAVENOUS
  Filled 2023-04-07: qty 1

## 2023-04-07 MED ORDER — POLYETHYLENE GLYCOL 3350 17 G PO PACK: 17.0000 g | PACK | Freq: Every day | ORAL | Status: DC

## 2023-04-07 MED ORDER — FENTANYL CITRATE PF 50 MCG/ML IJ SOSY
25.0000 ug | PREFILLED_SYRINGE | INTRAMUSCULAR | Status: DC | PRN
  Filled 2023-04-07: qty 1

## 2023-04-07 MED ORDER — AMIODARONE HCL IN DEXTROSE 360-4.14 MG/200ML-% IV SOLN
30.0000 mg/h | INTRAVENOUS | Status: DC
  Administered 2023-04-07 (×2): 30 mg/h via INTRAVENOUS

## 2023-04-07 MED ORDER — SODIUM CHLORIDE 0.9 % IV SOLN
3.0000 g | Freq: Four times a day (QID) | INTRAVENOUS | Status: DC
  Administered 2023-04-07: 3 g via INTRAVENOUS
  Filled 2023-04-07: qty 8

## 2023-04-07 MED ORDER — POLYVINYL ALCOHOL 1.4 % OP SOLN: 1.0000 [drp] | Freq: Four times a day (QID) | OPHTHALMIC | Status: DC | PRN

## 2023-04-07 MED ORDER — VASOPRESSIN 20 UNITS/100 ML INFUSION FOR SHOCK: 0.0300 [IU]/min | INTRAVENOUS | Status: DC

## 2023-04-07 MED ORDER — SODIUM CHLORIDE 0.9 % IV SOLN: INTRAVENOUS | Status: DC

## 2023-04-07 MED ORDER — POLYETHYLENE GLYCOL 3350 17 G PO PACK: 17.0000 g | PACK | Freq: Every day | ORAL | Status: DC | PRN

## 2023-04-07 MED ORDER — DOCUSATE SODIUM 50 MG/5ML PO LIQD: 100.0000 mg | Freq: Two times a day (BID) | ORAL | Status: DC

## 2023-04-07 MED ORDER — AMIODARONE LOAD VIA INFUSION
150.0000 mg | Freq: Once | INTRAVENOUS | Status: DC
  Filled 2023-04-07: qty 83.34

## 2023-04-07 MED ORDER — DOCUSATE SODIUM 100 MG PO CAPS: 100.0000 mg | ORAL_CAPSULE | Freq: Two times a day (BID) | ORAL | Status: DC | PRN

## 2023-04-07 MED ORDER — EPINEPHRINE HCL 5 MG/250ML IV SOLN IN NS
0.5000 ug/min | INTRAVENOUS | Status: DC
  Administered 2023-04-07 (×2): 30 ug/min via INTRAVENOUS
  Filled 2023-04-07: qty 250

## 2023-04-07 MED ORDER — POTASSIUM CHLORIDE 10 MEQ/100ML IV SOLN: 10.0000 meq | INTRAVENOUS | Status: DC

## 2023-04-07 MED ORDER — STERILE WATER FOR INJECTION IV SOLN
INTRAVENOUS | Status: DC
  Filled 2023-04-07 (×2): qty 1000

## 2023-04-07 MED ORDER — SODIUM BICARBONATE 8.4 % IV SOLN
50.0000 meq | Freq: Once | INTRAVENOUS | Status: AC
  Administered 2023-04-07: 50 meq via INTRAVENOUS

## 2023-04-07 MED ORDER — PROTHROMBIN COMPLEX CONC HUMAN 500 UNITS IV KIT
4160.0000 [IU] | PACK | Status: AC
  Administered 2023-04-07: 4160 [IU] via INTRAVENOUS
  Filled 2023-04-07: qty 4160

## 2023-04-07 MED ORDER — MIDAZOLAM-SODIUM CHLORIDE 100-0.9 MG/100ML-% IV SOLN
0.0000 mg/h | INTRAVENOUS | Status: DC
  Administered 2023-04-07: 1 mg/h via INTRAVENOUS
  Filled 2023-04-07: qty 100

## 2023-04-07 MED ORDER — VASOPRESSIN 20 UNITS/100 ML INFUSION FOR SHOCK
0.0000 [IU]/min | INTRAVENOUS | Status: DC
  Administered 2023-04-07: 0.03 [IU]/min via INTRAVENOUS
  Filled 2023-04-07: qty 100

## 2023-04-07 MED ORDER — HYDROCORTISONE SOD SUC (PF) 100 MG IJ SOLR
100.0000 mg | Freq: Three times a day (TID) | INTRAMUSCULAR | Status: DC
  Administered 2023-04-07: 100 mg via INTRAVENOUS
  Filled 2023-04-07: qty 2

## 2023-04-07 MED ORDER — NOREPINEPHRINE 4 MG/250ML-% IV SOLN
2.0000 ug/min | INTRAVENOUS | Status: DC
  Administered 2023-04-07: 30 ug/min via INTRAVENOUS

## 2023-04-07 MED ORDER — AMIODARONE HCL IN DEXTROSE 360-4.14 MG/200ML-% IV SOLN
60.0000 mg/h | INTRAVENOUS | Status: DC
  Filled 2023-04-07 (×2): qty 200

## 2023-04-07 MED ORDER — MIDAZOLAM HCL 2 MG/2ML IJ SOLN
2.0000 mg | INTRAMUSCULAR | Status: DC | PRN
  Administered 2023-04-07: 2 mg via INTRAVENOUS
  Filled 2023-04-07: qty 2

## 2023-04-07 MED ORDER — MIDAZOLAM HCL 2 MG/2ML IJ SOLN: 1.0000 mg | INTRAMUSCULAR | Status: DC | PRN

## 2023-04-07 MED ORDER — ACETAMINOPHEN 325 MG PO TABS
650.0000 mg | ORAL_TABLET | Freq: Four times a day (QID) | ORAL | Status: DC | PRN
  Administered 2023-04-07: 650 mg
  Filled 2023-04-07: qty 2

## 2023-04-07 MED ORDER — FAMOTIDINE 20 MG PO TABS: 20.0000 mg | ORAL_TABLET | Freq: Every day | ORAL | Status: DC

## 2023-04-07 MED ORDER — LACTATED RINGERS IV BOLUS
1000.0000 mL | Freq: Once | INTRAVENOUS | Status: AC
  Administered 2023-04-07: 1000 mL via INTRAVENOUS

## 2023-04-07 MED ORDER — ACETAMINOPHEN 325 MG PO TABS: 650.0000 mg | ORAL_TABLET | Freq: Four times a day (QID) | ORAL | Status: DC | PRN

## 2023-04-07 MED ORDER — CALCIUM GLUCONATE-NACL 1-0.675 GM/50ML-% IV SOLN
1.0000 g | Freq: Once | INTRAVENOUS | Status: AC
  Administered 2023-04-07: 1000 mg via INTRAVENOUS
  Filled 2023-04-07: qty 50

## 2023-04-07 MED ORDER — FAMOTIDINE 20 MG PO TABS: 20.0000 mg | ORAL_TABLET | Freq: Two times a day (BID) | ORAL | Status: DC

## 2023-04-07 MED ORDER — GLYCOPYRROLATE 0.2 MG/ML IJ SOLN: 0.2000 mg | INTRAMUSCULAR | Status: DC | PRN

## 2023-04-08 LAB — TYPE AND SCREEN
ABO/RH(D): A NEG
Antibody Screen: NEGATIVE
Unit division: 0
Unit division: 0

## 2023-04-08 LAB — BPAM RBC
Blood Product Expiration Date: 202411112359
Blood Product Expiration Date: 202411112359
ISSUE DATE / TIME: 202410140037
ISSUE DATE / TIME: 202410140037
Unit Type and Rh: 5100
Unit Type and Rh: 5100

## 2023-04-10 LAB — CULTURE, RESPIRATORY W GRAM STAIN

## 2023-04-12 LAB — CULTURE, BLOOD (ROUTINE X 2): Culture: NO GROWTH

## 2023-04-22 MED FILL — Medication: Qty: 1 | Status: AC

## 2023-04-25 NOTE — Progress Notes (Signed)
EEG complete - results pending 

## 2023-04-25 NOTE — Progress Notes (Signed)
RT compassionately extubated patient per MD order and family wishes.

## 2023-04-25 NOTE — Progress Notes (Signed)
RT NOTE:  Merlene Laughter, RRT transported patient from ED to 2M02 without complications.  Once patient arrived to unit this RT and second RT changed out patients vent to allow for epo, new vent has heated wire circuit, etc. Patient tolerated well. RT started EPO as soon as possible @0855  on 50 per MD order.  MD at bedside, RT assisted with bronchoscopy.   RT will obtain ABG in an hour.

## 2023-04-25 NOTE — Progress Notes (Addendum)
Pharmacy Antibiotic Note  [name] is a 82 y.o. male admitted on 03/30/2023 with hematemesis and SOB, now concerns for aspiration pneumonia .  Pharmacy has been consulted for Unasyn dosing.  WBC 13.3 >> 7.4, sCr 1.7 (bl~0.9-1), lactate 4.4, afebrile Respiratory and blood cultures ordered  Plan: Unasyn 3g IV every 6 hours Monitor renal function Follow up signs of clinical improvement, LOT, de-escalation of antibiotics   Weight: 81.6 kg (179 lb 14.3 oz)  Temp (24hrs), Avg:95 F (35 C), Min:94.9 F (34.9 C), Max:95.1 F (35.1 C)  Recent Labs  Lab 03/31/2023 2112 04/08/2023 2130 03/25/2023 2132 04/12/2023 0215  WBC 13.3*  --   --  7.4  CREATININE 1.66*  --  1.70*  --   LATICACIDVEN  --  4.4*  --   --     Estimated Creatinine Clearance: 36.1 mL/min (A) (by C-G formula based on SCr of 1.7 mg/dL (H)).    No Known Allergies  Antimicrobials this admission: Unasyn 10/14 >>   Microbiology results: 10/14 BCx:  10/14 Sputum:    Thank you for allowing pharmacy to be a part of this patient's care.  Arabella Merles, PharmD. Clinical Pharmacist 04/20/2023 2:55 AM

## 2023-04-25 NOTE — Consult Note (Addendum)
Duodenum wall is ill-defined. Imaging features could reflect duodenitis/peptic ulcer disease. 4. Apparent circumferential bladder wall thickening despite decompression by Foley catheter. Correlation with urinalysis recommended to exclude cystitis. 5. Collapse/consolidative opacity is seen in the both lungs, right greater than left, suspicious for pneumonia. 6.  Aortic Atherosclerosis (ICD10-I70.0). Electronically Signed   By: Kennith Center M.D.   On: 04/23/2023 06:40   CT Head Wo Contrast  Result Date: 04/18/2023 CLINICAL DATA:  82 year old male with altered mental status. Vomiting. EXAM: CT HEAD WITHOUT CONTRAST TECHNIQUE: Contiguous axial images were obtained from the base of the skull through the vertex without intravenous contrast. RADIATION DOSE REDUCTION: This exam was performed according to the departmental dose-optimization program which includes automated exposure control, adjustment of the mA and/or kV according to patient size and/or use of iterative reconstruction technique. COMPARISON:  Head CT 05/06/2012. FINDINGS: Mild motion artifact. Streak artifact also along the anterior frontal lobes. And some of the anterior orbit and scalp soft tissues, as well as a portion of the left frontal bone, are excluded Brain: Cerebral volume loss since 2013, appears generalized. No midline shift. No intracranial mass effect is evident. Basilar cisterns remain patent. No acute intracranial hemorrhage identified. No cortically based acute infarct identified. Vascular: Extensive Calcified atherosclerosis at the skull base. No suspicious intracranial vascular hyperdensity. Skull: Chronic left superior vertex craniotomy was present in 2013. Some of the left frontal  bone is excluded. No new osseous abnormality identified. Sinuses/Orbits: Improved paranasal sinus aeration compared to 2013. no significant sinus or middle ear opacification now. Other: Some of the anterior orbit and scalp soft tissues, as well as a portion of the left frontal bone, are excluded. No acute soft tissue finding. IMPRESSION: 1. Limited.  No acute intracranial abnormality identified. 2. Chronic left side craniotomy. Electronically Signed   By: Odessa Fleming M.D.   On: 04/09/2023 05:40   DG Chest Portable 1 View  Result Date: 04/08/2023 CLINICAL DATA:  Endotracheal tube placement, status post superior. EXAM: PORTABLE CHEST 1 VIEW COMPARISON:  03/30/2023. FINDINGS: The heart size and mediastinal contours are stable. There is atherosclerotic calcification of the aorta. The aortic knob is distended measuring 4.2 cm. Lung volumes are low with strandy opacities at the lung bases. No effusion or pneumothorax is seen. No acute osseous abnormality. The side port of the enteric tube is in the proximal esophagus and should be advanced a proximally 17 cm. The endotracheal tube terminates 4.1 cm above the carina. IMPRESSION: 1. Low lung volumes with atelectasis at the lung bases. 2. Aortic atherosclerosis with dilatation of the aortic knob measuring 4.2 cm. CT is recommended for further evaluation. 3. Side port of the enteric tube is in the proximal esophagus and should be advanced a proximally 17 cm. 4. Enteric tube terminates 4.1 cm above the carina. Electronically Signed   By: Thornell Sartorius M.D.   On: 03/28/2023 01:23   DG Chest Portable 1 View  Result Date: 04/12/2023 CLINICAL DATA:  Shortness of breath EXAM: PORTABLE CHEST 1 VIEW COMPARISON:  08/24/2021, 05/25/2014 FINDINGS: Hypoventilatory changes with atelectasis or scarring at the bases. Moderate to large hiatal hernia. Aortic atherosclerosis. No pneumothorax. Suspected emphysema with possible bola at the right apex IMPRESSION: 1. Hypoventilatory changes  with atelectasis or scarring at the bases. 2. Moderate to large hiatal hernia. 3. Possible bulla at the right apex Electronically Signed   By: Jasmine Pang M.D.   On: 04/04/2023 21:32      Impression / Plan:   Impression: 1.  Laterality: N/A;   ORIF FEMUR FRACTURE Left 10-02-2010   dr Charlann Boxer   subtrochanteric   POLYPECTOMY  07/11/2018   Procedure: POLYPECTOMY;  Surgeon: Rachael Fee, MD;  Location: [facility];  Service: Endoscopy;;   TONSILLECTOMY  1940's   TRANSTHORACIC ECHOCARDIOGRAM  05/01/2017   mild concentric LVH, ef 55-60%, grade 1 diastolic dysfunction/ trivial AR/ mild dilated ascending aorta/ mild to moderate MR (no stenosis)/ mild LAE/ mild TR/ mild to moderate increase PASP    Family History  Problem Relation Age of Onset   Macular degeneration Mother    Osteoarthritis Mother    Aneurysm Father        d/o 39 yo, ruptured AAA   Heart disease Other        No family history   Colon cancer Neg Hx    Colon polyps Neg Hx     Diabetes Neg Hx     Social History   Tobacco Use   Smoking status: Former    Current packs/day: 0.00    Average packs/day: 1 pack/day for 20.0 years (20.0 ttl pk-yrs)    Types: Cigarettes    Start date: 12/23/1951    Quit date: 12/23/1971    Years since quitting: 51.3   Smokeless tobacco: Never  Vaping Use   Vaping status: Never Used  Substance Use Topics   Alcohol use: Yes    Alcohol/week: 7.0 standard drinks of alcohol    Types: 7 Cans of beer per week    Comment: 1 beer daily   Drug use: No    Prior to Admission medications   Medication Sig Start Date End Date Taking? Authorizing Provider  ferrous sulfate 325 (65 FE) MG tablet Take 325 mg by mouth daily with breakfast.   Yes [provider]  olmesartan (BENICAR) 20 MG tablet Take 1 tablet (20 mg total) by mouth daily. TAKE 1 TABLET(20 MG) BY MOUTH DAILY 12/30/22  Yes [name], MD  omeprazole (PRILOSEC) 20 MG capsule Take 1 capsule (20 mg total) by mouth daily. Please keep your March Appointment for further refills. Thank you 10/06/21  Yes Medina-Vargas, Monina C, NP  ondansetron (ZOFRAN) 4 MG tablet Take 1 tablet (4 mg total) by mouth every 8 (eight) hours as needed for nausea or vomiting. 04/12/2023  Yes Claiborne Rigg, NP  rivaroxaban (XARELTO) 20 MG TABS tablet TAKE 1 TABLET(20 MG) BY MOUTH DAILY WITH SUPPER 10/18/22  Yes [name], MD  allopurinol (ZYLOPRIM) 300 MG tablet Take 1 tablet (300 mg total) by mouth every morning. 10/06/21   Medina-Vargas, Monina C, NP  furosemide (LASIX) 40 MG tablet Take 1 tablet (40 mg total) by mouth daily. Patient not taking: Reported on 04/13/2023 10/06/21   Medina-Vargas, Avanell Shackleton C, NP    Current Facility-Administered Medications  Medication Dose Route Frequency Provider Last Rate Last Admin   0.9 %  sodium chloride infusion (Manually program via Guardrails IV Fluids)   Intravenous Once Rancour, Stephen, MD       0.9 %  sodium chloride infusion  250 mL Intravenous  Continuous Rancour, Stephen, MD       0.9 %  sodium chloride infusion  250 mL Intravenous Continuous Ramaswamy, Murali, MD       0.9 %  sodium chloride infusion   Intra-arterial PRN Kalman Shan, MD       0.9 %  sodium chloride infusion   Intra-arterial PRN Cheri Fowler, MD       amiodarone (NEXTERONE) 1.8 mg/mL load  Laterality: N/A;   ORIF FEMUR FRACTURE Left 10-02-2010   dr Charlann Boxer   subtrochanteric   POLYPECTOMY  07/11/2018   Procedure: POLYPECTOMY;  Surgeon: Rachael Fee, MD;  Location: [facility];  Service: Endoscopy;;   TONSILLECTOMY  1940's   TRANSTHORACIC ECHOCARDIOGRAM  05/01/2017   mild concentric LVH, ef 55-60%, grade 1 diastolic dysfunction/ trivial AR/ mild dilated ascending aorta/ mild to moderate MR (no stenosis)/ mild LAE/ mild TR/ mild to moderate increase PASP    Family History  Problem Relation Age of Onset   Macular degeneration Mother    Osteoarthritis Mother    Aneurysm Father        d/o 39 yo, ruptured AAA   Heart disease Other        No family history   Colon cancer Neg Hx    Colon polyps Neg Hx     Diabetes Neg Hx     Social History   Tobacco Use   Smoking status: Former    Current packs/day: 0.00    Average packs/day: 1 pack/day for 20.0 years (20.0 ttl pk-yrs)    Types: Cigarettes    Start date: 12/23/1951    Quit date: 12/23/1971    Years since quitting: 51.3   Smokeless tobacco: Never  Vaping Use   Vaping status: Never Used  Substance Use Topics   Alcohol use: Yes    Alcohol/week: 7.0 standard drinks of alcohol    Types: 7 Cans of beer per week    Comment: 1 beer daily   Drug use: No    Prior to Admission medications   Medication Sig Start Date End Date Taking? Authorizing Provider  ferrous sulfate 325 (65 FE) MG tablet Take 325 mg by mouth daily with breakfast.   Yes [provider]  olmesartan (BENICAR) 20 MG tablet Take 1 tablet (20 mg total) by mouth daily. TAKE 1 TABLET(20 MG) BY MOUTH DAILY 12/30/22  Yes [name], MD  omeprazole (PRILOSEC) 20 MG capsule Take 1 capsule (20 mg total) by mouth daily. Please keep your March Appointment for further refills. Thank you 10/06/21  Yes Medina-Vargas, Monina C, NP  ondansetron (ZOFRAN) 4 MG tablet Take 1 tablet (4 mg total) by mouth every 8 (eight) hours as needed for nausea or vomiting. 04/12/2023  Yes Claiborne Rigg, NP  rivaroxaban (XARELTO) 20 MG TABS tablet TAKE 1 TABLET(20 MG) BY MOUTH DAILY WITH SUPPER 10/18/22  Yes [name], MD  allopurinol (ZYLOPRIM) 300 MG tablet Take 1 tablet (300 mg total) by mouth every morning. 10/06/21   Medina-Vargas, Monina C, NP  furosemide (LASIX) 40 MG tablet Take 1 tablet (40 mg total) by mouth daily. Patient not taking: Reported on 04/13/2023 10/06/21   Medina-Vargas, Avanell Shackleton C, NP    Current Facility-Administered Medications  Medication Dose Route Frequency Provider Last Rate Last Admin   0.9 %  sodium chloride infusion (Manually program via Guardrails IV Fluids)   Intravenous Once Rancour, Stephen, MD       0.9 %  sodium chloride infusion  250 mL Intravenous  Continuous Rancour, Stephen, MD       0.9 %  sodium chloride infusion  250 mL Intravenous Continuous Ramaswamy, Murali, MD       0.9 %  sodium chloride infusion   Intra-arterial PRN Kalman Shan, MD       0.9 %  sodium chloride infusion   Intra-arterial PRN Cheri Fowler, MD       amiodarone (NEXTERONE) 1.8 mg/mL load  values in this interval not displayed.      Latest Ref Rng & Units 04/01/2023    9:12 PM 08/25/2021    1:49 AM 08/24/2021    8:00 AM  Hepatic Function  Total Protein 6.5 - 8.1 g/dL 7.6  5.2  6.9   Albumin 3.5 - 5.0 g/dL 4.5  2.6  3.4   AST 15 - 41 U/L 35  24  36   ALT 0 - 44 U/L 18  16  15    Alk Phosphatase 38 - 126 U/L 57  38  47   Total Bilirubin 0.3 - 1.2 mg/dL 1.2  0.5  1.8      PT/INR Recent Labs    04/09/2023 0215  LABPROT 13.9  INR 1.1    STUDIES: DG CHEST PORT 1 VIEW  Result Date: 03/29/2023 CLINICAL DATA:  82 year old male with history of acute respiratory failure with hypoxia. EXAM: PORTABLE CHEST 1 VIEW COMPARISON:  Chest x-ray 03/27/2023. FINDINGS: An endotracheal tube is in place with tip at or near the level of the carina (poorly visualized secondary to under penetration of the image). A nasogastric tube is seen extending into the stomach, however, the tip of the nasogastric tube extends below the lower margin of the image. Patient is severely rotated to the right and very kyphotic in position. These factors, in addition to under penetration of the image limits the diagnostic sensitivity and specificity of the examination. Lung volumes are very low. Extensive bibasilar opacities (right greater than left) which may reflect areas of atelectasis and/or consolidation, with superimposed moderate to large right and moderate left pleural effusions. No pneumothorax. Pulmonary vasculature and cardiac silhouette are largely obscured. Atherosclerotic calcifications in the thoracic aorta. Transcutaneous defibrillator pads projecting over the left hemithorax. Post vertebroplasty changes in the lower thoracic spine incidentally noted. IMPRESSION: 1. Limited examination demonstrating support apparatus, as above.  Please note that the endotracheal tube tip appears at or near the level of the carina. Given the extensive right-sided volume loss, retraction of the tube approximately 2-3 cm for more optimal placement is recommended. 2. Extensive bibasilar opacities which may reflect areas of atelectasis and/or consolidation, with superimposed bilateral pleural effusions, as above. 3. Aortic atherosclerosis. Electronically Signed   By: Trudie Reed M.D.   On: 03/26/2023 07:23   DG CHEST PORT 1 VIEW  Result Date: 04/19/2023 CLINICAL DATA:  Ventilator dependence. EXAM: PORTABLE CHEST 1 VIEW COMPARISON:  04/06/2013, earlier same day FINDINGS: Patient is markedly rotated to the right. Despite this limitation, endotracheal tube tip appears to be on the order of 1-2 cm above the carina NG tube tip is seen in the distal esophagus bibasilar collapse/consolidation, right greater than left. Bones are diffusely demineralized. Telemetry leads overlie the chest. IMPRESSION: 1. Endotracheal tube tip appears to be on the order of 1-2 cm above the carina. 2. NG tube tip is in the distal esophagus. 3. Bibasilar collapse/consolidation, right greater than left, progressive in the interval since the previous study. Electronically Signed   By: Kennith Center M.D.   On: 03/30/2023 07:16   CT Angio Chest/Abd/Pel for Dissection W and/or Wo Contrast  Result Date: 04/03/2023 CLINICAL DATA:  Known AAA.  Vomiting. EXAM: CT ANGIOGRAPHY CHEST, ABDOMEN AND PELVIS TECHNIQUE: Non-contrast CT of the chest was initially obtained. Multidetector CT imaging through the chest, abdomen and pelvis was performed using the standard protocol during bolus administration of intravenous contrast. Multiplanar reconstructed images and MIPs were obtained and reviewed to evaluate the vascular anatomy. RADIATION DOSE  values in this interval not displayed.      Latest Ref Rng & Units 04/01/2023    9:12 PM 08/25/2021    1:49 AM 08/24/2021    8:00 AM  Hepatic Function  Total Protein 6.5 - 8.1 g/dL 7.6  5.2  6.9   Albumin 3.5 - 5.0 g/dL 4.5  2.6  3.4   AST 15 - 41 U/L 35  24  36   ALT 0 - 44 U/L 18  16  15    Alk Phosphatase 38 - 126 U/L 57  38  47   Total Bilirubin 0.3 - 1.2 mg/dL 1.2  0.5  1.8      PT/INR Recent Labs    04/09/2023 0215  LABPROT 13.9  INR 1.1    STUDIES: DG CHEST PORT 1 VIEW  Result Date: 03/29/2023 CLINICAL DATA:  82 year old male with history of acute respiratory failure with hypoxia. EXAM: PORTABLE CHEST 1 VIEW COMPARISON:  Chest x-ray 03/27/2023. FINDINGS: An endotracheal tube is in place with tip at or near the level of the carina (poorly visualized secondary to under penetration of the image). A nasogastric tube is seen extending into the stomach, however, the tip of the nasogastric tube extends below the lower margin of the image. Patient is severely rotated to the right and very kyphotic in position. These factors, in addition to under penetration of the image limits the diagnostic sensitivity and specificity of the examination. Lung volumes are very low. Extensive bibasilar opacities (right greater than left) which may reflect areas of atelectasis and/or consolidation, with superimposed moderate to large right and moderate left pleural effusions. No pneumothorax. Pulmonary vasculature and cardiac silhouette are largely obscured. Atherosclerotic calcifications in the thoracic aorta. Transcutaneous defibrillator pads projecting over the left hemithorax. Post vertebroplasty changes in the lower thoracic spine incidentally noted. IMPRESSION: 1. Limited examination demonstrating support apparatus, as above.  Please note that the endotracheal tube tip appears at or near the level of the carina. Given the extensive right-sided volume loss, retraction of the tube approximately 2-3 cm for more optimal placement is recommended. 2. Extensive bibasilar opacities which may reflect areas of atelectasis and/or consolidation, with superimposed bilateral pleural effusions, as above. 3. Aortic atherosclerosis. Electronically Signed   By: Trudie Reed M.D.   On: 03/26/2023 07:23   DG CHEST PORT 1 VIEW  Result Date: 04/19/2023 CLINICAL DATA:  Ventilator dependence. EXAM: PORTABLE CHEST 1 VIEW COMPARISON:  04/06/2013, earlier same day FINDINGS: Patient is markedly rotated to the right. Despite this limitation, endotracheal tube tip appears to be on the order of 1-2 cm above the carina NG tube tip is seen in the distal esophagus bibasilar collapse/consolidation, right greater than left. Bones are diffusely demineralized. Telemetry leads overlie the chest. IMPRESSION: 1. Endotracheal tube tip appears to be on the order of 1-2 cm above the carina. 2. NG tube tip is in the distal esophagus. 3. Bibasilar collapse/consolidation, right greater than left, progressive in the interval since the previous study. Electronically Signed   By: Kennith Center M.D.   On: 03/30/2023 07:16   CT Angio Chest/Abd/Pel for Dissection W and/or Wo Contrast  Result Date: 04/03/2023 CLINICAL DATA:  Known AAA.  Vomiting. EXAM: CT ANGIOGRAPHY CHEST, ABDOMEN AND PELVIS TECHNIQUE: Non-contrast CT of the chest was initially obtained. Multidetector CT imaging through the chest, abdomen and pelvis was performed using the standard protocol during bolus administration of intravenous contrast. Multiplanar reconstructed images and MIPs were obtained and reviewed to evaluate the vascular anatomy. RADIATION DOSE  Duodenum wall is ill-defined. Imaging features could reflect duodenitis/peptic ulcer disease. 4. Apparent circumferential bladder wall thickening despite decompression by Foley catheter. Correlation with urinalysis recommended to exclude cystitis. 5. Collapse/consolidative opacity is seen in the both lungs, right greater than left, suspicious for pneumonia. 6.  Aortic Atherosclerosis (ICD10-I70.0). Electronically Signed   By: Kennith Center M.D.   On: 04/23/2023 06:40   CT Head Wo Contrast  Result Date: 04/18/2023 CLINICAL DATA:  82 year old male with altered mental status. Vomiting. EXAM: CT HEAD WITHOUT CONTRAST TECHNIQUE: Contiguous axial images were obtained from the base of the skull through the vertex without intravenous contrast. RADIATION DOSE REDUCTION: This exam was performed according to the departmental dose-optimization program which includes automated exposure control, adjustment of the mA and/or kV according to patient size and/or use of iterative reconstruction technique. COMPARISON:  Head CT 05/06/2012. FINDINGS: Mild motion artifact. Streak artifact also along the anterior frontal lobes. And some of the anterior orbit and scalp soft tissues, as well as a portion of the left frontal bone, are excluded Brain: Cerebral volume loss since 2013, appears generalized. No midline shift. No intracranial mass effect is evident. Basilar cisterns remain patent. No acute intracranial hemorrhage identified. No cortically based acute infarct identified. Vascular: Extensive Calcified atherosclerosis at the skull base. No suspicious intracranial vascular hyperdensity. Skull: Chronic left superior vertex craniotomy was present in 2013. Some of the left frontal  bone is excluded. No new osseous abnormality identified. Sinuses/Orbits: Improved paranasal sinus aeration compared to 2013. no significant sinus or middle ear opacification now. Other: Some of the anterior orbit and scalp soft tissues, as well as a portion of the left frontal bone, are excluded. No acute soft tissue finding. IMPRESSION: 1. Limited.  No acute intracranial abnormality identified. 2. Chronic left side craniotomy. Electronically Signed   By: Odessa Fleming M.D.   On: 04/09/2023 05:40   DG Chest Portable 1 View  Result Date: 04/08/2023 CLINICAL DATA:  Endotracheal tube placement, status post superior. EXAM: PORTABLE CHEST 1 VIEW COMPARISON:  03/30/2023. FINDINGS: The heart size and mediastinal contours are stable. There is atherosclerotic calcification of the aorta. The aortic knob is distended measuring 4.2 cm. Lung volumes are low with strandy opacities at the lung bases. No effusion or pneumothorax is seen. No acute osseous abnormality. The side port of the enteric tube is in the proximal esophagus and should be advanced a proximally 17 cm. The endotracheal tube terminates 4.1 cm above the carina. IMPRESSION: 1. Low lung volumes with atelectasis at the lung bases. 2. Aortic atherosclerosis with dilatation of the aortic knob measuring 4.2 cm. CT is recommended for further evaluation. 3. Side port of the enteric tube is in the proximal esophagus and should be advanced a proximally 17 cm. 4. Enteric tube terminates 4.1 cm above the carina. Electronically Signed   By: Thornell Sartorius M.D.   On: 03/28/2023 01:23   DG Chest Portable 1 View  Result Date: 04/12/2023 CLINICAL DATA:  Shortness of breath EXAM: PORTABLE CHEST 1 VIEW COMPARISON:  08/24/2021, 05/25/2014 FINDINGS: Hypoventilatory changes with atelectasis or scarring at the bases. Moderate to large hiatal hernia. Aortic atherosclerosis. No pneumothorax. Suspected emphysema with possible bola at the right apex IMPRESSION: 1. Hypoventilatory changes  with atelectasis or scarring at the bases. 2. Moderate to large hiatal hernia. 3. Possible bulla at the right apex Electronically Signed   By: Jasmine Pang M.D.   On: 04/04/2023 21:32      Impression / Plan:   Impression: 1.  values in this interval not displayed.      Latest Ref Rng & Units 04/01/2023    9:12 PM 08/25/2021    1:49 AM 08/24/2021    8:00 AM  Hepatic Function  Total Protein 6.5 - 8.1 g/dL 7.6  5.2  6.9   Albumin 3.5 - 5.0 g/dL 4.5  2.6  3.4   AST 15 - 41 U/L 35  24  36   ALT 0 - 44 U/L 18  16  15    Alk Phosphatase 38 - 126 U/L 57  38  47   Total Bilirubin 0.3 - 1.2 mg/dL 1.2  0.5  1.8      PT/INR Recent Labs    04/09/2023 0215  LABPROT 13.9  INR 1.1    STUDIES: DG CHEST PORT 1 VIEW  Result Date: 03/29/2023 CLINICAL DATA:  82 year old male with history of acute respiratory failure with hypoxia. EXAM: PORTABLE CHEST 1 VIEW COMPARISON:  Chest x-ray 03/27/2023. FINDINGS: An endotracheal tube is in place with tip at or near the level of the carina (poorly visualized secondary to under penetration of the image). A nasogastric tube is seen extending into the stomach, however, the tip of the nasogastric tube extends below the lower margin of the image. Patient is severely rotated to the right and very kyphotic in position. These factors, in addition to under penetration of the image limits the diagnostic sensitivity and specificity of the examination. Lung volumes are very low. Extensive bibasilar opacities (right greater than left) which may reflect areas of atelectasis and/or consolidation, with superimposed moderate to large right and moderate left pleural effusions. No pneumothorax. Pulmonary vasculature and cardiac silhouette are largely obscured. Atherosclerotic calcifications in the thoracic aorta. Transcutaneous defibrillator pads projecting over the left hemithorax. Post vertebroplasty changes in the lower thoracic spine incidentally noted. IMPRESSION: 1. Limited examination demonstrating support apparatus, as above.  Please note that the endotracheal tube tip appears at or near the level of the carina. Given the extensive right-sided volume loss, retraction of the tube approximately 2-3 cm for more optimal placement is recommended. 2. Extensive bibasilar opacities which may reflect areas of atelectasis and/or consolidation, with superimposed bilateral pleural effusions, as above. 3. Aortic atherosclerosis. Electronically Signed   By: Trudie Reed M.D.   On: 03/26/2023 07:23   DG CHEST PORT 1 VIEW  Result Date: 04/19/2023 CLINICAL DATA:  Ventilator dependence. EXAM: PORTABLE CHEST 1 VIEW COMPARISON:  04/06/2013, earlier same day FINDINGS: Patient is markedly rotated to the right. Despite this limitation, endotracheal tube tip appears to be on the order of 1-2 cm above the carina NG tube tip is seen in the distal esophagus bibasilar collapse/consolidation, right greater than left. Bones are diffusely demineralized. Telemetry leads overlie the chest. IMPRESSION: 1. Endotracheal tube tip appears to be on the order of 1-2 cm above the carina. 2. NG tube tip is in the distal esophagus. 3. Bibasilar collapse/consolidation, right greater than left, progressive in the interval since the previous study. Electronically Signed   By: Kennith Center M.D.   On: 03/30/2023 07:16   CT Angio Chest/Abd/Pel for Dissection W and/or Wo Contrast  Result Date: 04/03/2023 CLINICAL DATA:  Known AAA.  Vomiting. EXAM: CT ANGIOGRAPHY CHEST, ABDOMEN AND PELVIS TECHNIQUE: Non-contrast CT of the chest was initially obtained. Multidetector CT imaging through the chest, abdomen and pelvis was performed using the standard protocol during bolus administration of intravenous contrast. Multiplanar reconstructed images and MIPs were obtained and reviewed to evaluate the vascular anatomy. RADIATION DOSE  values in this interval not displayed.      Latest Ref Rng & Units 04/01/2023    9:12 PM 08/25/2021    1:49 AM 08/24/2021    8:00 AM  Hepatic Function  Total Protein 6.5 - 8.1 g/dL 7.6  5.2  6.9   Albumin 3.5 - 5.0 g/dL 4.5  2.6  3.4   AST 15 - 41 U/L 35  24  36   ALT 0 - 44 U/L 18  16  15    Alk Phosphatase 38 - 126 U/L 57  38  47   Total Bilirubin 0.3 - 1.2 mg/dL 1.2  0.5  1.8      PT/INR Recent Labs    04/09/2023 0215  LABPROT 13.9  INR 1.1    STUDIES: DG CHEST PORT 1 VIEW  Result Date: 03/29/2023 CLINICAL DATA:  82 year old male with history of acute respiratory failure with hypoxia. EXAM: PORTABLE CHEST 1 VIEW COMPARISON:  Chest x-ray 03/27/2023. FINDINGS: An endotracheal tube is in place with tip at or near the level of the carina (poorly visualized secondary to under penetration of the image). A nasogastric tube is seen extending into the stomach, however, the tip of the nasogastric tube extends below the lower margin of the image. Patient is severely rotated to the right and very kyphotic in position. These factors, in addition to under penetration of the image limits the diagnostic sensitivity and specificity of the examination. Lung volumes are very low. Extensive bibasilar opacities (right greater than left) which may reflect areas of atelectasis and/or consolidation, with superimposed moderate to large right and moderate left pleural effusions. No pneumothorax. Pulmonary vasculature and cardiac silhouette are largely obscured. Atherosclerotic calcifications in the thoracic aorta. Transcutaneous defibrillator pads projecting over the left hemithorax. Post vertebroplasty changes in the lower thoracic spine incidentally noted. IMPRESSION: 1. Limited examination demonstrating support apparatus, as above.  Please note that the endotracheal tube tip appears at or near the level of the carina. Given the extensive right-sided volume loss, retraction of the tube approximately 2-3 cm for more optimal placement is recommended. 2. Extensive bibasilar opacities which may reflect areas of atelectasis and/or consolidation, with superimposed bilateral pleural effusions, as above. 3. Aortic atherosclerosis. Electronically Signed   By: Trudie Reed M.D.   On: 03/26/2023 07:23   DG CHEST PORT 1 VIEW  Result Date: 04/19/2023 CLINICAL DATA:  Ventilator dependence. EXAM: PORTABLE CHEST 1 VIEW COMPARISON:  04/06/2013, earlier same day FINDINGS: Patient is markedly rotated to the right. Despite this limitation, endotracheal tube tip appears to be on the order of 1-2 cm above the carina NG tube tip is seen in the distal esophagus bibasilar collapse/consolidation, right greater than left. Bones are diffusely demineralized. Telemetry leads overlie the chest. IMPRESSION: 1. Endotracheal tube tip appears to be on the order of 1-2 cm above the carina. 2. NG tube tip is in the distal esophagus. 3. Bibasilar collapse/consolidation, right greater than left, progressive in the interval since the previous study. Electronically Signed   By: Kennith Center M.D.   On: 03/30/2023 07:16   CT Angio Chest/Abd/Pel for Dissection W and/or Wo Contrast  Result Date: 04/03/2023 CLINICAL DATA:  Known AAA.  Vomiting. EXAM: CT ANGIOGRAPHY CHEST, ABDOMEN AND PELVIS TECHNIQUE: Non-contrast CT of the chest was initially obtained. Multidetector CT imaging through the chest, abdomen and pelvis was performed using the standard protocol during bolus administration of intravenous contrast. Multiplanar reconstructed images and MIPs were obtained and reviewed to evaluate the vascular anatomy. RADIATION DOSE  values in this interval not displayed.      Latest Ref Rng & Units 04/01/2023    9:12 PM 08/25/2021    1:49 AM 08/24/2021    8:00 AM  Hepatic Function  Total Protein 6.5 - 8.1 g/dL 7.6  5.2  6.9   Albumin 3.5 - 5.0 g/dL 4.5  2.6  3.4   AST 15 - 41 U/L 35  24  36   ALT 0 - 44 U/L 18  16  15    Alk Phosphatase 38 - 126 U/L 57  38  47   Total Bilirubin 0.3 - 1.2 mg/dL 1.2  0.5  1.8      PT/INR Recent Labs    04/09/2023 0215  LABPROT 13.9  INR 1.1    STUDIES: DG CHEST PORT 1 VIEW  Result Date: 03/29/2023 CLINICAL DATA:  82 year old male with history of acute respiratory failure with hypoxia. EXAM: PORTABLE CHEST 1 VIEW COMPARISON:  Chest x-ray 03/27/2023. FINDINGS: An endotracheal tube is in place with tip at or near the level of the carina (poorly visualized secondary to under penetration of the image). A nasogastric tube is seen extending into the stomach, however, the tip of the nasogastric tube extends below the lower margin of the image. Patient is severely rotated to the right and very kyphotic in position. These factors, in addition to under penetration of the image limits the diagnostic sensitivity and specificity of the examination. Lung volumes are very low. Extensive bibasilar opacities (right greater than left) which may reflect areas of atelectasis and/or consolidation, with superimposed moderate to large right and moderate left pleural effusions. No pneumothorax. Pulmonary vasculature and cardiac silhouette are largely obscured. Atherosclerotic calcifications in the thoracic aorta. Transcutaneous defibrillator pads projecting over the left hemithorax. Post vertebroplasty changes in the lower thoracic spine incidentally noted. IMPRESSION: 1. Limited examination demonstrating support apparatus, as above.  Please note that the endotracheal tube tip appears at or near the level of the carina. Given the extensive right-sided volume loss, retraction of the tube approximately 2-3 cm for more optimal placement is recommended. 2. Extensive bibasilar opacities which may reflect areas of atelectasis and/or consolidation, with superimposed bilateral pleural effusions, as above. 3. Aortic atherosclerosis. Electronically Signed   By: Trudie Reed M.D.   On: 03/26/2023 07:23   DG CHEST PORT 1 VIEW  Result Date: 04/19/2023 CLINICAL DATA:  Ventilator dependence. EXAM: PORTABLE CHEST 1 VIEW COMPARISON:  04/06/2013, earlier same day FINDINGS: Patient is markedly rotated to the right. Despite this limitation, endotracheal tube tip appears to be on the order of 1-2 cm above the carina NG tube tip is seen in the distal esophagus bibasilar collapse/consolidation, right greater than left. Bones are diffusely demineralized. Telemetry leads overlie the chest. IMPRESSION: 1. Endotracheal tube tip appears to be on the order of 1-2 cm above the carina. 2. NG tube tip is in the distal esophagus. 3. Bibasilar collapse/consolidation, right greater than left, progressive in the interval since the previous study. Electronically Signed   By: Kennith Center M.D.   On: 03/30/2023 07:16   CT Angio Chest/Abd/Pel for Dissection W and/or Wo Contrast  Result Date: 04/03/2023 CLINICAL DATA:  Known AAA.  Vomiting. EXAM: CT ANGIOGRAPHY CHEST, ABDOMEN AND PELVIS TECHNIQUE: Non-contrast CT of the chest was initially obtained. Multidetector CT imaging through the chest, abdomen and pelvis was performed using the standard protocol during bolus administration of intravenous contrast. Multiplanar reconstructed images and MIPs were obtained and reviewed to evaluate the vascular anatomy. RADIATION DOSE

## 2023-04-25 NOTE — Procedures (Signed)
Patient Name: [name]  MRN: [id number]  Epilepsy Attending: Charlsie Quest  Referring Physician/Provider: Lidia Collum, PA-C  Date: 03/29/2023 Duration: 25.40 mins  Patient history: 82yo M s/p cardiac arrest getting eeg to evaluate for seizure  Level of alertness: comatose  AEDs during EEG study: None  Technical aspects: This EEG study was done with scalp electrodes positioned according to the 10-20 International system of electrode placement. Electrical activity was reviewed with band pass filter of 1-70Hz , sensitivity of 7 uV/mm, display speed of 32mm/sec with a 60Hz  notched filter applied as appropriate. EEG data were recorded continuously and digitally stored.  Video monitoring was available and reviewed as appropriate.  Description: EEG showed continuous/ generalized 3 to 6 Hz theta-delta slowing, at times with triphasic morphology. Hyperventilation and photic stimulation were not performed.     ABNORMALITY - Continuous slow, generalized  IMPRESSION: This study is suggestive of severe diffuse encephalopathy. No seizures or epileptiform discharges were seen throughout the recording.  Brailynn Breth Annabelle Harman

## 2023-04-25 NOTE — Progress Notes (Signed)
Pt transported to CT w/o complications. RN x2 and RT @ bedside.

## 2023-04-25 NOTE — ED Notes (Signed)
2nd unit of PRBCs complete at 0059.

## 2023-04-25 NOTE — Progress Notes (Signed)
RT obtained ABG @1025  and results as follows: 7.36/51.3/48/29.3 sat 80%. RT reported to MD. No new orders for RT at this time.

## 2023-04-25 NOTE — ED Notes (Signed)
Code started: 0034 epi given 0036 amio 30 0037 epi 1240 pulse check PEA 0041 epi 0044 pulse regained 0051 amio given

## 2023-04-25 NOTE — H&P (Addendum)
NAME:  [name], MRN:  [id number], DOB:  1941-01-12, LOS: 0 ADMISSION DATE:  03/26/2023, CONSULTATION DATE:  04/15/2023 REFERRING MD:  Dr. Ledon Snare, CHIEF COMPLAINT:   Hematemesis PCP Merri Brunette, MD GI: Dr Ileene Patrick  History of Present Illness:  Patient is a 82 year old male with pertinent PMH DVT on Xarelto, Barrett's esophagus, pulm hypertension, AAA, CAD, HTN presents to [facility] ED on 10/13 with vomiting of coffee-ground emesis. Patient states that he began vomiting on 10/13 and evening after eating stew.  Has started up about 20 times.  The color has now changed to a black color.  Having some mild SOB concerning for aspiration.  Denies any abdominal pain or fevers.  Denies any bloody/dark stools.  Came to [facility] ED for further workup.  On arrival patient ill-appearing and tachycardic.  He was mottled diffusely per E-RN. BP initially 67/55 given IV fluids.  Patient given Protonix.  Given Kcentra.  Hgb 15.6.  K 3.2 and mag 2.2.  Creat 1.66.  Glucose 198. Lipase 500.  CXR with atelectasis at bases; moderate large hiatal hernia.  Patient with worsening hypotension started on levo and given PRBCs.  Patient having increased O2 requirements and was intubated with concern for airway protection and aspiration.  Shortly after intubation patient had episode of nonsustained V. tach which proceeded to lose pulses.  CPR started.  No shock was given but 3 rounds of CPR performed until ROSC.  Patient started on amnio drip.  CT ABD/pelvis pending.  PCCM consulted. At time of CCM eval patient on epi gtt, levophed gtt, amio gtt. Needs CVL. Wife and son next door - request full code   Noted patient has severe baseline kyphosis and intubation was challenging and CPR had to be done with patient sideways  Recent issues   - March 2023 Admit for aspiration pneumonia and acute on chronic diastolic heart failure with sepsis -January 2020 admission for GI bleed [history of hiatal hernia, diverticulosis, esophageal  stricture, duodenitis   PAST HX      has a past medical history of AAA (abdominal aortic aneurysm) (HCC) (followed by dr Jens Som), Anticoagulant long-term use, Arthritis, Barrett's esophagus (2009), Bilateral lower extremity edema, CAD (coronary artery disease) (cardioloigst--  dr Jens Som), Chronic lower back pain, 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HCC), Diverticulosis, Dyspnea on exertion, FH: colonic polyps, GERD (gastroesophageal reflux disease), Gout, History of adenomatous polyp of colon, History of DVT of lower extremity, History of esophageal stricture, History of gastric polyp, History of pulmonary embolus (PE), History of radiation therapy (06/22/2012-08/18/2012), History of subdural hematoma (03/2012), Hypertension, Lesion of bladder, Prostate cancer [facility]) (urologist-  dr wrenn/ oncologist- dr Roselind Messier), Pulmonary HTN (HCC) (04/12/2014), and RBBB (right bundle branch block with left anterior fascicular block).   reports that he quit smoking about 51 years ago. His smoking use included cigarettes. He started smoking about 71 years ago. He has a 20 pack-year smoking history. He has never used smokeless tobacco.  Past Surgical History:  Procedure Laterality Date   BIOPSY  07/11/2018   Procedure: BIOPSY;  Surgeon: Rachael Fee, MD;  Location: [facility];  Service: Endoscopy;;   CATARACT EXTRACTION W/ INTRAOCULAR LENS  IMPLANT, BILATERAL  2018   COLONOSCOPY WITH PROPOFOL N/A 04/25/2015   Procedure: COLONOSCOPY WITH PROPOFOL;  Surgeon: Ruffin Frederick, MD;  Location: [facility];  Service: Gastroenterology;  Laterality: N/A;   COLONOSCOPY WITH PROPOFOL N/A 07/11/2018   Procedure: COLONOSCOPY WITH PROPOFOL;  Surgeon: Rachael Fee, MD;  Location: [facility];  Service: Endoscopy;  Laterality: N/A;   CRANIOTOMY  04/03/2012   Procedure: CRANIOTOMY HEMATOMA EVACUATION SUBDURAL;  Surgeon: Maeola Harman, MD;  Location: [facility];  Service: Neurosurgery;  Laterality: Left;  Left Craniotomy  for Evacuation of Subdural Hematoma   CYSTOSCOPY WITH BIOPSY N/A 09/02/2017   Procedure: CYSTOSCOPY WITH BIOPSY AND FULGURATION POSSIBLE TRANSURETHRAL RESECTION OF BLADDER TUMOR;  Surgeon: Bjorn Pippin, MD;  Location: [facility];  Service: Urology;  Laterality: N/A;   ESOPHAGOGASTRODUODENOSCOPY (EGD) WITH PROPOFOL N/A 04/25/2015   Procedure: ESOPHAGOGASTRODUODENOSCOPY (EGD) WITH PROPOFOL;  Surgeon: Ruffin Frederick, MD;  Location: [facility];  Service: Gastroenterology;  Laterality: N/A;   ESOPHAGOGASTRODUODENOSCOPY (EGD) WITH PROPOFOL N/A 07/11/2018   Procedure: ESOPHAGOGASTRODUODENOSCOPY (EGD) WITH PROPOFOL;  Surgeon: Rachael Fee, MD;  Location: [facility];  Service: Endoscopy;  Laterality: N/A;   IRRIGATION AND DEBRIDEMENT SEBACEOUS CYST  01-11-13   "off my back"   KYPHOPLASTY N/A 12/23/2017   Procedure: Lumbar One KYPHOPLASTY;  Surgeon: Maeola Harman, MD;  Location: [facility] OR;  Service: Neurosurgery;  Laterality: N/A;  Lumbar One KYPHOPLASTY   LEFT AND RIGHT HEART CATHETERIZATION WITH CORONARY ANGIOGRAM N/A 04/12/2014   Procedure: LEFT AND RIGHT HEART CATHETERIZATION WITH CORONARY ANGIOGRAM;  Surgeon: Peter M Swaziland, MD;  Location: [facility] CATH LAB;  Service: Cardiovascular;  Laterality: N/A;   ORIF FEMUR FRACTURE Left 10-02-2010   dr Charlann Boxer   subtrochanteric   POLYPECTOMY  07/11/2018   Procedure: POLYPECTOMY;  Surgeon: Rachael Fee, MD;  Location: [facility];  Service: Endoscopy;;   TONSILLECTOMY  1940's   TRANSTHORACIC ECHOCARDIOGRAM  05/01/2017   mild concentric LVH, ef 55-60%, grade 1 diastolic dysfunction/ trivial AR/ mild dilated ascending aorta/ mild to moderate MR (no stenosis)/ mild LAE/ mild TR/ mild to moderate increase PASP    No Known Allergies  Immunization History  Administered Date(s) Administered   DTaP 10/15/2012   Influenza, High Dose Seasonal PF 04/07/2015, 04/15/2016   Influenza, Quadrivalent, Recombinant, Inj, Pf 02/27/2018, 02/10/2019,  03/10/2020, 03/02/2021   Influenza,inj,Quad PF,6+ Mos 04/23/2017   Influenza-Unspecified 03/19/2012, 04/09/2013, 02/22/2014, 04/28/2014   PFIZER(Purple Top)SARS-COV-2 Vaccination 07/06/2019, 07/26/2019, 03/28/2020   Pfizer Covid-19 Vaccine Bivalent Booster 50yrs & up 05/10/2021   Pneumococcal Conjugate-13 11/08/2013   Pneumococcal Polysaccharide-23 08/23/2003   Td 02/06/2006   Tdap 10/15/2012   Zoster, Live 02/04/2006    Family History  Problem Relation Age of Onset   Macular degeneration Mother    Osteoarthritis Mother    Aneurysm Father        d/o 61 yo, ruptured AAA   Heart disease Other        No family history   Colon cancer Neg Hx    Colon polyps Neg Hx    Diabetes Neg Hx      Current Facility-Administered Medications:    0.9 %  sodium chloride infusion, 250 mL, Intravenous, Continuous, Rancour, Stephen, MD   calcium gluconate 1 g/ 50 mL sodium chloride IVPB, 1 g, Intravenous, Once, Rancour, Stephen, MD   etomidate (AMIDATE) 2 MG/ML injection, , , ,    fentaNYL (SUBLIMAZE) injection 25 mcg, 25 mcg, Intravenous, Q15 min PRN, Rancour, Stephen, MD   fentaNYL (SUBLIMAZE) injection 25-100 mcg, 25-100 mcg, Intravenous, Q30 min PRN, Rancour, Stephen, MD   midazolam (VERSED) 100 mg/100 mL (1 mg/mL) premix infusion, 0-10 mg/hr, Intravenous, Continuous, Rancour, Stephen, MD, Last Rate: 1 mL/hr at 04/13/2023 0105, 1 mg/hr at 03/27/2023 0105   midazolam (VERSED) bolus via infusion 0-5 mg, 0-5 mg, Intravenous, Q1H PRN, Glynn Octave, MD,  1 mg at 04/23/2023 0105   norepinephrine (LEVOPHED) 4mg  in (0.016 mg/mL) premix infusion, 2-10 mcg/min, Intravenous, Titrated, Rancour, Stephen, MD, Last Rate: 112.5 mL/hr at 04/01/2023 0045, 30 mcg/min at 04/19/2023 0045   pantoprazole (PROTONIX) 80 mg /NS 100 mL IVPB, 80 mg, Intravenous, Once, Rancour, Stephen, MD   Melene Muller ON 04/10/2023] pantoprazole (PROTONIX) injection 40 mg, 40 mg, Intravenous, Q12H, Rancour, Stephen, MD   pantoprozole (PROTONIX) 80  mg /NS 100 mL infusion, 8 mg/hr, Intravenous, Continuous, Rancour, Stephen, MD   prothrombin complex conc human (KCENTRA) IVPB 4,160 Units, 4,160 Units, Intravenous, STAT, Rancour, Jeannett Senior, MD, Last Rate: 384 mL/hr at 04/17/2023 0128, 4,160 Units at 04/11/2023 0128   sodium bicarbonate injection 50 mEq, 50 mEq, Intravenous, Once, Rancour, Stephen, MD   succinylcholine (ANECTINE) 200 MG/10ML syringe, , , ,   Current Outpatient Medications:    ferrous sulfate 325 (65 FE) MG tablet, Take 325 mg by mouth daily with breakfast., Disp: , Rfl:    olmesartan (BENICAR) 20 MG tablet, Take 1 tablet (20 mg total) by mouth daily. TAKE 1 TABLET(20 MG) BY MOUTH DAILY, Disp: 90 tablet, Rfl: 3   omeprazole (PRILOSEC) 20 MG capsule, Take 1 capsule (20 mg total) by mouth daily. Please keep your March Appointment for further refills. Thank you, Disp: 30 capsule, Rfl: 0   ondansetron (ZOFRAN) 4 MG tablet, Take 1 tablet (4 mg total) by mouth every 8 (eight) hours as needed for nausea or vomiting., Disp: 30 tablet, Rfl: 0   rivaroxaban (XARELTO) 20 MG TABS tablet, TAKE 1 TABLET(20 MG) BY MOUTH DAILY WITH SUPPER, Disp: 90 tablet, Rfl: 1   allopurinol (ZYLOPRIM) 300 MG tablet, Take 1 tablet (300 mg total) by mouth every morning., Disp: 30 tablet, Rfl: 0   furosemide (LASIX) 40 MG tablet, Take 1 tablet (40 mg total) by mouth daily. (Patient not taking: Reported on 2023/04/30), Disp: 30 tablet, Rfl: 0     Significant Hospital Events: Including procedures, antibiotic start and stop dates in addition to other pertinent events   10/14 admitted with hematemesis; worsening hypoxia requiring intubation; shortly after intubation VT/PEA arrest ROSC after 3 rounds  Interim History / Subjective:  See above  Objective   Blood pressure 96/69, pulse 97, resp. rate (!) 24, SpO2 96%.       No intake or output data in the 24 hours ending 04/08/2023 0100 There were no vitals filed for this visit.  Examination: General Appearance:   Looks criticall ill  Head:  Normocephalic, without obvious abnormality, atraumatic. Does appear to have Left temporoparietal bur hole Eyes:  PERRL - yes, conjunctiva/corneas - midu     Ears:  Normal external ear canals, both ears Nose:  G tube - no Throat:  ETT TUBE - yes , OG tube - yes with significant coffee-ground returns Neck:  Supple,  No enlargement/tenderness/nodules Lungs: Clear to auscultation bilaterally, Ventilator   Synchrony - yes Heart:  S1 and S2 normal, no murmur, CVP - x.  Pressors -Levophed and epinephrine through peripheral line Abdomen:  Soft, no masses, no organomegaly Genitalia / Rectal:  Not done Extremities:  Extremities- intact Skin:  ntact in exposed areas . Sacral area - not examined Neurologic:  Sedation -currently unresponsive post CPR not on sedation MSK: Severe kyphosis     Resolved Hospital Problem list     Assessment & Plan:  Hx of barrett's esophagus , 2020 history of GI bleed, hiatal hernia Significant hematemesis (on Xarelto prior to admission] associated with nausea and retching.  1/0/14/24:  in ER ongoing coffee-ground returns in the ER with OG tube.  Plan: -Given Kcentra -Continue PPI infusion -GI consult -CT ABD/pelvis pending -N.p.o. -Continue to hold Xarelto   Acute respiratory failure with hypoxia  due to Possible aspiration   Plan: -LTVV strategy with tidal volumes of 6-8 cc/kg ideal body weight -check ABG and adjust settings accordingly  -Wean PEEP/FiO2 for SpO2 >92% -VAP bundle in place -Daily SAT and SBT -PAD protocol in place -wean sedation for RASS goal 0 to -1 -Follow intermittent CXR and ABG PRN   Cardiac arrest due to NS Vtach - post intubation went into nonsustained VT and lost pulses; CPR started; no shock given; ROSC obtained after 3 rounds of CPR    Plan: - amio gtto to continue for now -will admit to ICU w/ continuous telemetry monitoring -continue pressors for MAP goal >65 -IV fluids YES -trend  troponin  -echo   Unresponsive post CPR  Plan  - CT head  - EEG - Normothermia protocol  Circulatory Shock due to GI bleed and complicated by cardiac arrest  Plan  - Pressors for MAP > 65  - levophed  - epi gtt  - start vasopresson  -   Likely Aspiration pneumonia   Plan -check cultures: bcx2, urine culture/UA, and trach culture -start UNASYN -check procalcitonin -CXR     DVT on Xarelto Plan: -given kcentra -cont to hold home ac -tele monitoring  AKI - at admit (baselin 0.9mg %)  creat 1.66mg %  Plan  - Maintain BP/HR - avoid nephrotxoxin  Lactic Acidosis  Plan  - monitor  Hypokalemia   Plan  -r epeltre     CAD HTN Plan: -Hold home Lasix -Not currently on any antihypertensives  AAA Plan: -cta abd/pelvis pending  MSK  Severe Kyphosis - cannot lay flat. Interferes with care  Plan  - monitior    Best Practice (right click and "Reselect all SmartList Selections" daily)   Diet/type: NPO DVT prophylaxis: not indicated GI prophylaxis: PPI Lines: Central line Foley:  Yes, and it is still needed Code Status:  full code Last date of multidisciplinary goals of care discussion [ SSI  YES   Interdisciplinary Goals of Care Family Meeting   Date carried out:: 04-08-2023  Location of the meeting:  ER  Member's involved: Physician, Family Member or next of kin, and Other: son wife, PA JD Carmie Kanner Power of Attorney or Environmental health practitioner: wife    Discussion: We discussed goals of care for [facility] .    Code status: Full Code  Disposition: Continue current acute care   Time spent for the meeting: 10 min      ATTESTATION & SIGNATURE   The patient Legrande Macdonell is critically ill with multiple organ systems failure and requires high complexity decision making for assessment and support, frequent evaluation and titration of therapies, application of advanced monitoring technologies and extensive  interpretation of multiple databases and discussion with other appropriate health care personnel such as bedside nurses, social workers, case Production designer, theatre/television/film, consultants, respiratory therapists, nutritionists, secretaries etc.,  Critical care time includes but is not restricted to just documentation time. Documentation can happen in parallel or sequential to care time depending on case mix urgency and priorities for the shift. So, overall critical Care Time devoted to patient care services described in this note is  45  Minutes.   This time reflects time of care of this signee Dr Kalman Shan which includ does not reflect procedure time,  or teaching time or supervisory time of PA/NP/Med student/Med Resident etc but could involve care discussion time     Dr. Kalman Shan, M.D., [facility].C.P Pulmonary and Critical Care Medicine Staff Physician, [facility] [place] [facility] Pager: [phone], If no answer or between  15:00h - 7:00h: call [phone]  04/18/23 1:50 AM    LABS    PULMONARY Recent Labs  Lab 04/24/2023 2132  TCO2 30    CBC Recent Labs  Lab 03/29/2023 2112 03/27/2023 2132  HGB 15.6 15.6  HCT 45.1 46.0  WBC 13.3*  --   PLT 232  --     COAGULATION No results for input(s): "INR" in the last 168 hours.  CARDIAC  No results for input(s): "TROPONINI" in the last 168 hours. No results for input(s): "PROBNP" in the last 168 hours.   CHEMISTRY Recent Labs  Lab 04/22/2023 2112 04/01/2023 2132  NA 143 144  K 3.2* 3.0*  CL 97* 101  CO2 29  --   GLUCOSE 198* 201*  BUN 41* 43*  CREATININE 1.66* 1.70*  CALCIUM 9.3  --   MG 2.2  --    Estimated Creatinine Clearance: 36.1 mL/min (A) (by C-G formula based on SCr of 1.7 mg/dL (H)).   LIVER Recent Labs  Lab 04/19/2023 2112  AST 35  ALT 18  ALKPHOS 57  BILITOT 1.2  PROT 7.6  ALBUMIN 4.5     INFECTIOUS Recent Labs  Lab 04/10/2023 2130  LATICACIDVEN 4.4*     ENDOCRINE CBG (last 3)   No results for input(s): "GLUCAP" in the last 72 hours.       IMAGING x48h  - image(s) personally visualized  -   highlighted in bold DG Chest Portable 1 View  Result Date: 2023/04/18 CLINICAL DATA:  Endotracheal tube placement, status post superior. EXAM: PORTABLE CHEST 1 VIEW COMPARISON:  04/14/2023. FINDINGS: The heart size and mediastinal contours are stable. There is atherosclerotic calcification of the aorta. The aortic knob is distended measuring 4.2 cm. Lung volumes are low with strandy opacities at the lung bases. No effusion or pneumothorax is seen. No acute osseous abnormality. The side port of the enteric tube is in the proximal esophagus and should be advanced a proximally 17 cm. The endotracheal tube terminates 4.1 cm above the carina. IMPRESSION: 1. Low lung volumes with atelectasis at the lung bases. 2. Aortic atherosclerosis with dilatation of the aortic knob measuring 4.2 cm. CT is recommended for further evaluation. 3. Side port of the enteric tube is in the proximal esophagus and should be advanced a proximally 17 cm. 4. Enteric tube terminates 4.1 cm above the carina. Electronically Signed   By: Thornell Sartorius M.D.   On: 18-Apr-2023 01:23   DG Chest Portable 1 View  Result Date: 03/31/2023 CLINICAL DATA:  Shortness of breath EXAM: PORTABLE CHEST 1 VIEW COMPARISON:  08/24/2021, 05/25/2014 FINDINGS: Hypoventilatory changes with atelectasis or scarring at the bases. Moderate to large hiatal hernia. Aortic atherosclerosis. No pneumothorax. Suspected emphysema with possible bola at the right apex IMPRESSION: 1. Hypoventilatory changes with atelectasis or scarring at the bases. 2. Moderate to large hiatal hernia. 3. Possible bulla at the right apex Electronically Signed   By: Jasmine Pang M.D.   On: 04/01/2023 21:32

## 2023-04-25 NOTE — ED Notes (Signed)
Per primary RN: Pt had episode of bloody coffee ground emesis- first since arrival to ED.  Pt O2 sats 83% with good pleth on RA. Pt appeared diaphoretic and lethargic. EDP to bedside. NRB placed at 15L. EDP at bedside to discuss intubation with pt. He agreed to be intubated. Family informed and also in agreement

## 2023-04-25 NOTE — Progress Notes (Addendum)
Pt asystole on monitor. Heart and Lung sounds auscultated, no sounds noted.  Pt expired at 1449. Pt's wife and son at bedside. Pt pronounced by Julius Bowels RN and Quay Burow RN

## 2023-04-25 NOTE — Progress Notes (Signed)
Pt OTF will try back as schedule permits.

## 2023-04-25 NOTE — Progress Notes (Signed)
During Bronchoscopy procedure, verbal order from [place], [place] to give etomidate & Rocronium @0903 .  During procedure, patient's SBP dropped into the 40's, verbal order to turn pt's levo up to 100 beyond current order parameters, see MAR all titrations made per CCM MD. 2amps of Bicarb given @ 0906, 1G Calcium @ 0908

## 2023-04-25 NOTE — Progress Notes (Signed)
Patient seen, examined and chart reviewed  82 year old male with a DVT on Xarelto, Barrett's esophagus who presented with nausea, vomiting with coffee-ground emesis, course was complicated with aspiration leading to cardiac arrest and septic shock  Patient blood pressure continued to drop, currently on 70 mics of Levophed, vasopressin and 30 mics of epinephrine Received multiple boluses of bicarbonate    Physical exam: General: Crtitically ill-appearing elderly male, orally intubated HEENT: [place]/AT, eyes anicteric.  ETT and OGT in place Neuro: Eyes closed, does not open, not following commands Chest: Bilateral basal crackles right more than left, no wheezes or rhonchi Heart: Regular rate and rhythm, no murmurs or gallops Abdomen: Soft, nondistended, bowel sounds present Skin: No rash  Labs and images reviewed  Assessment and plan: Acute hypoxic/hypercapnic respiratory failure Mixed metabolic/respiratory acidosis Severe sepsis with septic shock due to aspiration pneumonia, POA Status post V. tach/PEA cardiac arrest Upper GI bleeding which made worse in the setting of Xarelto Acute kidney injury due to ischemic ATN Hypokalemia/hypocalcemia Acute septic encephalopathy  Continue lung protective ventilation, vent setting was adjusted to clear hypercapnia Continue bicarbonate infusion Continue IV antibiotics Currently on multiple vasopressor support, unfortunately reaching to max level, patient has not shown any signs of improvement Hold Xarelto Continue Protonix infusion Continue aggressive electrolyte replacement Minimize sedation  Continue goals of care discussion, unable to reach to patient's family at this time   Critical care time spent 75 minutes    Cheri Fowler, MD [place] [facility] See Amion for pager If no response to pager, please call [phone] until 7pm After 7pm, Please call E-link [phone]

## 2023-04-25 NOTE — Procedures (Signed)
Central Venous Catheter Insertion Procedure Note  [name]  [id number]  Apr 13, 1941  Date:04/23/2023  Time:2:19 AM   Provider Performing:Moya Duan D Suzie Portela   Procedure: Insertion of Non-tunneled Central Venous [phone]) with US guidance ([id number])   Indication(s) Medication administration  Consent Risks of the procedure as well as the alternatives and risks of each were explained to the patient and/or caregiver.  Consent for the procedure was obtained and is signed in the bedside chart  Anesthesia Topical only with 1% lidocaine   Timeout Verified patient identification, verified procedure, site/side was marked, verified correct patient position, special equipment/implants available, medications/allergies/relevant history reviewed, required imaging and test results available.  Sterile Technique Maximal sterile technique including full sterile barrier drape, hand hygiene, sterile gown, sterile gloves, mask, hair covering, sterile ultrasound probe cover (if used).  Procedure Description Area of catheter insertion was cleaned with chlorhexidine and draped in sterile fashion.  With real-time ultrasound guidance a central venous catheter was placed into the left femoral vein. Nonpulsatile blood flow and easy flushing noted in all ports.  The catheter was sutured in place and sterile dressing applied.  Complications/Tolerance None; patient tolerated the procedure well. Chest X-ray is ordered to verify placement for internal jugular or subclavian cannulation.   Chest x-ray is not ordered for femoral cannulation.  EBL Minimal  Specimen(s) None  [name] Pulmonary & Critical Care 04/02/2023, 2:19 AM  Please see [email or web address] for pager details.  From 7A-7P if no response, please call [phone]. After hours, please call ELink [phone].

## 2023-04-25 NOTE — IPAL (Signed)
Interdisciplinary Goals of Care Family Meeting   Date carried out:: 03/31/2023  Location of the meeting: Bedside  Member's involved: Physician, Bedside Registered Nurse, and Family Member or next of kin  Durable Power of Attorney or acting medical decision maker: Marlyn Corporal    Discussion: We discussed goals of care for [facility] .    The Clinical status was relayed to patient's wife and son at bedside in detail.   Updated and notified of patients medical condition.     Patient remains unresponsive and will not open eyes to command.   Patient with increased WOB and using accessory muscles to breathe Explained to family course of therapy and the modalities  Evidence of kidney failure Recommend follow up NEUROLOGY RECS   Patient with Progressive multiorgan failure with a very high probablity of a very minimal chance of meaningful recovery despite all aggressive and optimal medical therapy.  Code status: Full DNR  Disposition: In-patient comfort care    Family are satisfied with Plan of action and management. All questions answered   Cheri Fowler MD [place] [facility] See Amion for pager If no response to pager, please call [phone] until 7pm After 7pm, Please call E-link [phone]

## 2023-04-25 NOTE — Procedures (Signed)
Bronchoscopy Procedure Note  [name]  [id number]  11/14/1940  Date:04/15/2023  Time:9:20 AM   Provider Performing:Zarianna Dicarlo   Procedure(s):  Flexible bronchoscopy with bronchial alveolar lavage [phone]) and Initial Therapeutic Aspiration of Tracheobronchial Tree [phone])  Indication(s) Acute respiratory failure/aspiration PNA  Consent Unable to obtain consent due to emergent nature of procedure./Unable to reach family by phone  Anesthesia Etomidate and rocuronium   Time Out Verified patient identification, verified procedure, site/side was marked, verified correct patient position, special equipment/implants available, medications/allergies/relevant history reviewed, required imaging and test results available.   Sterile Technique Usual hand hygiene, masks, gowns, and gloves were used   Procedure Description Bronchoscope advanced through endotracheal tube and into airway.  Airways were examined down to subsegmental level with findings noted below.   Following diagnostic evaluation, BAL(s) performed in right lower lobe with normal saline and return of radish/frothy fluid and Therapeutic aspiration performed in bilateral lower lobe  Findings: Copious amounts of frothy secretions noted throughout the airway, suctioned and BAL performed     Complications/Tolerance None; patient tolerated the procedure well. Chest X-ray is not needed post procedure.   EBL Minimal   Specimen(s) BAL

## 2023-04-25 NOTE — Progress Notes (Signed)
04/13/2023 0900  Spiritual Encounters  Type of Visit Initial  Care provided to: Patient  Conversation partners present during encounter Nurse  Referral source Code page  Reason for visit Code  OnCall Visit Yes   Chaplain responded to Code [facility] page. Chaplain was present, but the nurse stated that at this time, the pt does not require your assistance. However, I will notify you when the family arrives, so you provide the support as needed. Chaplain will follow up if needed.   M.Kubra Evelette Hollern, MA Chaplain Intern [phone]

## 2023-04-25 NOTE — ED Provider Notes (Signed)
Care assumed from Dr. Ledon Snare and Dr. Silverio Lay.  Patient with nausea, vomiting, and abdominal pain.  He is hypotensive with coffee-ground emesis.  He is awaiting CT imaging but has difficulty lying flat due to kyphosis.   On my initial assessment of the patient, he is tachypneic, has scattered rhonchi, dyspneic with conversation.  O2 saturation 83% on 3 L nasal cannula.  Transitioned to nonrebreather.  Blood pressure soft in the 80s. Additional IVF ordered.  Concern for aspiration.  Patient hypoxic in the mid 80s requiring nonrebreather.  Blood pressure soft in the 80s.  Gurgling respirations with concern for aspiration.  Discussed intubation with patient and he agrees to proceed.  CT scan not yet performed.  Discussed with patient's wife by phone who agrees with full code at this time.  Patient intubated successfully.  Significantly kyphotic requiring sitting up intubation.  Shortly after intubation patient had episode of sustained ventricular tachycardia and proceeded to lose pulses.  CPR was initiated immediately. VT terminated without shocks. He was given ACLS protocol for approximately 10 minutes with epinephrine x 3 as well as bicarb and calcium.  Patient loaded with 300 mg amiodarone and amiodarone drip begun.  Pulses regained after approximately 10 minutes of CPR.  Pulse was regained after approximately 10 minutes of CPR.  Patient continued on amiodarone infusion, Protonix infusion emergency release blood ordered.  Will repeat labs.  Concern for coffee-ground emesis with likely aspiration.  Patient's wife called and agrees with full code at this time.  Admission to ICU discussed with Dr. Phillis Knack Last EGD showed evidence of a hiatal hernia and esophagitis.  Son and wife updated at bedside.  Remain full code for now.  Discussed with Dr. Marina Goodell of Gastroenterology who agrees with current management will see patient in the morning.  Agrees with need for CT imaging.  Suspect his likely source of  vomiting is pancreatitis given his elevated lipase.  .Critical Care  Performed by: Glynn Octave, MD Authorized by: Glynn Octave, MD   Critical care provider statement:    Critical care time (minutes):  105   Critical care time was exclusive of:  Separately billable procedures and treating other patients   Critical care was necessary to treat or prevent imminent or life-threatening deterioration of the following conditions:  Circulatory failure, cardiac failure, shock, respiratory failure and metabolic crisis   Critical care was time spent personally by me on the following activities:  Development of treatment plan with patient or surrogate, discussions with consultants, evaluation of patient's response to treatment, examination of patient, ordering and review of laboratory studies, ordering and review of radiographic studies, ordering and performing treatments and interventions, pulse oximetry, re-evaluation of patient's condition, review of old charts, blood draw for specimens and obtaining history from patient or surrogate   I assumed direction of critical care for this patient from another provider in my specialty: no     Care discussed with: admitting provider   CPR  Date/Time: 04/24/2023 1:03 AM  Performed by: Glynn Octave, MD Authorized by: Glynn Octave, MD  CPR Procedure Details:      Amount of time prior to administration of ACLS/BLS (minutes):  0   ACLS/BLS initiated by EMS: No     CPR/ACLS performed in the ED: Yes     Duration of CPR (minutes):  10   Outcome: ROSC obtained    CPR performed via ACLS guidelines under my direct supervision.  See RN documentation for details including defibrillator use, medications, doses and timing. Procedure Name:  Intubation Date/Time: 04/12/2023 2:17 AM  Performed by: Glynn Octave, MDPre-anesthesia Checklist: Patient identified, Patient being monitored, Emergency Drugs available, Timeout performed and Suction available Oxygen  Delivery Method: Non-rebreather mask Preoxygenation: Pre-oxygenation with 100% oxygen Induction Type: Rapid sequence Ventilation: Mask ventilation without difficulty Laryngoscope Size: Glidescope and 4 Tube size: 7.5 mm Number of attempts: 1 Airway Equipment and Method: Lighted stylet, Video-laryngoscopy, Patient positioned with wedge pillow and Rigid stylet Placement Confirmation: ETT inserted through vocal cords under direct vision, CO2 detector, Breath sounds checked- equal and bilateral and Positive ETCO2 Secured at: 24 cm Tube secured with: ETT holder Dental Injury: Teeth and Oropharynx as per pre-operative assessment  Difficulty Due To: Difficulty was anticipated and Difficult Airway- due to reduced neck mobility Future Recommendations: Recommend- induction with short-acting agent, and alternative techniques readily available Comments: Difficult intubation due to kyphosis and inability to lay flat.        Glynn Octave, MD 04/08/2023 [phone]

## 2023-04-25 NOTE — Progress Notes (Signed)
Apr 18, 2023 0255  Spiritual Encounters  Type of Visit Initial  Care provided to: [facility] partners present during encounter Nurse;Physician  Referral source Nurse (RN/NT/LPN)  Reason for visit Urgent spiritual support  OnCall Visit Yes   Chaplain was in ED for another trauma call when Trauma nurse requested chaplain presence for Pt's wife and son coming into the ED.  Chaplain met family as they arrived and took them to a clear room down from the Pt's room.  Physician and RN came in to update family on Pt's very serious condition.  Chaplain provided compassionate presence and supported wife and son as they processed critical decisions that needed to be made. Over the course of about 90 minutes chaplain returned to the room several times to check in on family and support them. Prayer was offered as Pt's wife expressed their families [place] faith. Chaplain will continue to follow.  Chaplain services remain available by Spiritual Consult or for emergent cases, paging [phone]  Chaplain Raelene Bott, MDiv [place]ley [email or web address] [phone]

## 2023-04-25 NOTE — Death Summary Note (Signed)
DEATH SUMMARY   Patient Details  Name: [name] MRN: [id number] DOB: 01-28-1941  Admission/Discharge Information   Admit Date:  2023-04-18  Date of Death: Date of Death: 19-Apr-2023  Time of Death: Time of Death: May 25, 1448  Length of Stay: 0  Referring Physician: Merri Brunette, MD   Reason(s) for Hospitalization  Acute hypoxic/hypercapnic respiratory failure Mixed metabolic/respiratory acidosis Severe sepsis with septic shock due to aspiration pneumonia, POA Status post V. tach/PEA cardiac arrest Upper GI bleeding which made worse in the setting of Xarelto Acute kidney injury due to ischemic ATN Hypokalemia/hypocalcemia Acute septic encephalopathy  Diagnoses  Preliminary cause of death: Withdrawal of care in the setting of multisystem organ failure postcardiac arrest Secondary Diagnoses (including complications and co-morbidities):  Principal Problem:   Cardiac arrest [facility]) Active Problems:   Hemorrhagic shock (HCC)   Acute respiratory failure with hypoxia [facility])   Brief Hospital Course (including significant findings, care, treatment, and services provided and events leading to death)  [name] is a 82 y.o. year old male  with PMH DVT on Xarelto, Barrett's esophagus, pulm hypertension, AAA, CAD, HTN presents to [facility] ED on April 18, 2023 with vomiting of coffee-ground emesis. Patient states that he began vomiting on April 18, 2023 and evening after eating stew.  Has started up about 20 times.  The color has changed to a black color.  Having some mild SOB concerning for aspiration.  Denies any abdominal pain or fevers.  Denies any bloody/dark stools.  Came to [facility] ED for further workup.   On arrival patient ill-appearing and tachycardic.  He was mottled diffusely per E-RN. BP initially 67/55 given IV fluids.  Patient given Protonix.  Given Kcentra.  Hgb 15.6.  K 3.2 and mag 2.2.  Creat 1.66.  Glucose 198. Lipase 500.  CXR with atelectasis at bases; moderate large hiatal hernia.  Patient with  worsening hypotension started on levo and given PRBCs.  Patient having increased O2 requirements and was intubated with concern for airway protection and aspiration.  Shortly after intubation patient had episode of nonsustained V. tach which proceeded to lose pulses.  CPR started.  No shock was given but 3 rounds of CPR performed until ROSC.  Patient started on amnio drip.  CT ABD/pelvis showing bilateral lower lobe infiltrates, infrarenal abdominal aortic aneurysm Patient was hypotensive, requiring multiple vasopressor support including epinephrine, norepinephrine and vasopressin, he was severely acidotic, was given multiple bicarbonate pushes followed by bicarbonate infusion.  He was continued on IV amiodarone in the setting of V. tach.  Urgent bronchoscopy was done with BAL, with improvement in O2 sat but patient blood pressure continued to decline despite all efforts.  Goals of care discussions were carried with family, considering he was in multisystem organ failure with poor prognosis, patient's family decided to proceed with comfort care and palliative extubation.  Patient was palliatively extubated and he passed on 04-18-22 at 2:49 PM.  Patient's family was at bedside    Pertinent Labs and Studies  Significant Diagnostic Studies DG Abd Portable 1V  Result Date: 04-19-2023 CLINICAL DATA:  Orogastric tube placement. EXAM: PORTABLE ABDOMEN - 1 VIEW COMPARISON:  None Available. FINDINGS: Tip and side port of the enteric tube below the diaphragm in the stomach. There is a left femoral central line with tip in the expected location of the common iliac vessels. Generalized paucity of bowel gas. IMPRESSION: Tip and side port of the enteric tube below the diaphragm in the stomach. Electronically Signed   By: Narda Rutherford M.D.   On:  04/08/2023 12:07   EEG adult  Result Date: 03/27/2023 Charlsie Quest, MD     03/27/2023 10:55 AM Patient Name: [name] MRN: [id number] Epilepsy Attending:  Charlsie Quest Referring Physician/Provider: Lidia Collum, PA-C Date: 03/27/2023 Duration: 25.40 mins Patient history: 82yo M s/p cardiac arrest getting eeg to evaluate for seizure Level of alertness: comatose AEDs during EEG study: None Technical aspects: This EEG study was done with scalp electrodes positioned according to the 10-20 International system of electrode placement. Electrical activity was reviewed with band pass filter of 1-70Hz , sensitivity of 7 uV/mm, display speed of 82mm/sec with a 60Hz  notched filter applied as appropriate. EEG data were recorded continuously and digitally stored.  Video monitoring was available and reviewed as appropriate. Description: EEG showed continuous/ generalized 3 to 6 Hz theta-delta slowing, at times with triphasic morphology. Hyperventilation and photic stimulation were not performed.   ABNORMALITY - Continuous slow, generalized IMPRESSION: This study is suggestive of severe diffuse encephalopathy. No seizures or epileptiform discharges were seen throughout the recording. Charlsie Quest   DG CHEST PORT 1 VIEW  Result Date: 03/27/2023 CLINICAL DATA:  82 year old male with history of acute respiratory failure with hypoxia. EXAM: PORTABLE CHEST 1 VIEW COMPARISON:  Chest x-ray 03/30/2023. FINDINGS: An endotracheal tube is in place with tip at or near the level of the carina (poorly visualized secondary to under penetration of the image). A nasogastric tube is seen extending into the stomach, however, the tip of the nasogastric tube extends below the lower margin of the image. Patient is severely rotated to the right and very kyphotic in position. These factors, in addition to under penetration of the image limits the diagnostic sensitivity and specificity of the examination. Lung volumes are very low. Extensive bibasilar opacities (right greater than left) which may reflect areas of atelectasis and/or consolidation, with superimposed moderate to large right and  moderate left pleural effusions. No pneumothorax. Pulmonary vasculature and cardiac silhouette are largely obscured. Atherosclerotic calcifications in the thoracic aorta. Transcutaneous defibrillator pads projecting over the left hemithorax. Post vertebroplasty changes in the lower thoracic spine incidentally noted. IMPRESSION: 1. Limited examination demonstrating support apparatus, as above. Please note that the endotracheal tube tip appears at or near the level of the carina. Given the extensive right-sided volume loss, retraction of the tube approximately 2-3 cm for more optimal placement is recommended. 2. Extensive bibasilar opacities which may reflect areas of atelectasis and/or consolidation, with superimposed bilateral pleural effusions, as above. 3. Aortic atherosclerosis. Electronically Signed   By: Trudie Reed M.D.   On: 03/26/2023 07:23   DG CHEST PORT 1 VIEW  Result Date: 04/16/2023 CLINICAL DATA:  Ventilator dependence. EXAM: PORTABLE CHEST 1 VIEW COMPARISON:  04/06/2013, earlier same day FINDINGS: Patient is markedly rotated to the right. Despite this limitation, endotracheal tube tip appears to be on the order of 1-2 cm above the carina NG tube tip is seen in the distal esophagus bibasilar collapse/consolidation, right greater than left. Bones are diffusely demineralized. Telemetry leads overlie the chest. IMPRESSION: 1. Endotracheal tube tip appears to be on the order of 1-2 cm above the carina. 2. NG tube tip is in the distal esophagus. 3. Bibasilar collapse/consolidation, right greater than left, progressive in the interval since the previous study. Electronically Signed   By: Kennith Center M.D.   On: 04/21/2023 07:16   CT Angio Chest/Abd/Pel for Dissection W and/or Wo Contrast  Result Date: 03/29/2023 CLINICAL DATA:  Known AAA.  Vomiting. EXAM: CT ANGIOGRAPHY CHEST,  ABDOMEN AND PELVIS TECHNIQUE: Non-contrast CT of the chest was initially obtained. Multidetector CT imaging through  the chest, abdomen and pelvis was performed using the standard protocol during bolus administration of intravenous contrast. Multiplanar reconstructed images and MIPs were obtained and reviewed to evaluate the vascular anatomy. RADIATION DOSE REDUCTION: This exam was performed according to the departmental dose-optimization program which includes automated exposure control, adjustment of the mA and/or kV according to patient size and/or use of iterative reconstruction technique. CONTRAST:  75mL OMNIPAQUE IOHEXOL 350 MG/ML SOLN COMPARISON:  CTA chest 04/12/2014 FINDINGS: CTA CHEST FINDINGS Cardiovascular: Pre contrast imaging shows no hyperdense crescent in the wall of the thoracic aorta to suggest the presence of an acute intramural hematoma. Ascending thoracic aorta measures 4.0 cm diameter. Advanced atherosclerotic calcification is noted in the wall of the thoracic aorta. Aortic arch branch vessel anatomy is widely patent. Mediastinum/Nodes: Endotracheal tube noted. NG tube tip is in the distal esophagus. Fluid in the distal esophagus may be related to reflux or dysmotility. No mediastinal lymphadenopathy. No evidence for left hilar lymphadenopathy. Collapse/consolidative opacity is seen in the right hilum. There is no axillary lymphadenopathy. Lungs/Pleura: Dense consolidative opacity is seen in the posterior right lung. Ground-glass and consolidative central airspace disease is seen in the left upper lobe with collapse/consolidation in the dependent left lower lobe. No substantial pleural effusions. Musculoskeletal: Bones are diffusely demineralized with distortion of skeletal anatomy due to marked thoracolumbar kyphosis. No displaced acute sternal or rib fracture evident. Review of the MIP images confirms the above findings. CTA ABDOMEN AND PELVIS FINDINGS VASCULAR Aorta: Infrarenal abdominal aorta dilated up to 4.1 x 3.7 cm transaxial diameters. No dissection, vasculitis or significant stenosis. Celiac: Patent  without evidence of aneurysm, dissection, vasculitis or significant stenosis. SMA: Patent without evidence of aneurysm, dissection, vasculitis or significant stenosis. Renals: Both renal arteries are opacified. Accessory renal artery noted on the left. IMA: IMA is opacified. Inflow: Patent without evidence of aneurysm, dissection, vasculitis or significant stenosis. Veins: No obvious venous abnormality within the limitations of this arterial phase study. Left common femoral vein catheter tip is positioned at the confluence of the external and internal left iliac venous anatomy. Review of the MIP images confirms the above findings. NON-VASCULAR Hepatobiliary: No suspicious focal abnormality within the liver parenchyma. Gallbladder is distended. No intrahepatic or extrahepatic biliary dilation. Pancreas: Pancreas is diffusely atrophic without main duct dilatation. Spleen: Calcified granulomata evident. Adrenals/Urinary Tract: No adrenal nodule or mass. Exophytic cyst noted upper pole left kidney. Right kidney unremarkable. No hydroureter. Apparent circumferential bladder wall thickening despite decompression by Foley catheter. Stomach/Bowel: Stomach is mildly distended and fluid-filled. Component of diffuse gastric wall thickening not excluded. Duodenum is fluid-filled and mildly distended with some peri duodenal edema/inflammation. The wall of the duodenum is ill-defined. No evidence for small bowel obstruction. The terminal ileum is normal. The appendix is normal. Diverticuli are seen scattered along the entire length of the colon without CT findings of diverticulitis. Lymphatic: There is no gastrohepatic or hepatoduodenal ligament lymphadenopathy. No retroperitoneal or mesenteric lymphadenopathy. No pelvic sidewall lymphadenopathy. Reproductive: Fiducial markers noted in the region of the prostate gland. Other: No intraperitoneal free fluid. Musculoskeletal: Bones are markedly osteopenic. Status post vertebral  augmentation at L1. T11 compression deformity evident. Fixation hardware noted left femoral neck. Review of the MIP images confirms the above findings. IMPRESSION: 1. No evidence for thoracic or abdominal aortic dissection. Infrarenal abdominal aortic aneurysm measures up to 4.1 x 3.7 cm, increased from maximum diameter of 3.5 cm  on CT scan of 08/04/2017. 2. NG tube tip is in the distal esophagus. This may need to be advanced for more appropriate positioning. 3. Fluid-filled stomach and duodenum with some peri duodenal edema/inflammation. The wall of the stomach appears irregular and diffusely thickened raising the question of gastritis. Duodenum wall is ill-defined. Imaging features could reflect duodenitis/peptic ulcer disease. 4. Apparent circumferential bladder wall thickening despite decompression by Foley catheter. Correlation with urinalysis recommended to exclude cystitis. 5. Collapse/consolidative opacity is seen in the both lungs, right greater than left, suspicious for pneumonia. 6.  Aortic Atherosclerosis (ICD10-I70.0). Electronically Signed   By: Kennith Center M.D.   On: 2023-04-23 06:40   CT Head Wo Contrast  Result Date: 2023-04-23 CLINICAL DATA:  82 year old male with altered mental status. Vomiting. EXAM: CT HEAD WITHOUT CONTRAST TECHNIQUE: Contiguous axial images were obtained from the base of the skull through the vertex without intravenous contrast. RADIATION DOSE REDUCTION: This exam was performed according to the departmental dose-optimization program which includes automated exposure control, adjustment of the mA and/or kV according to patient size and/or use of iterative reconstruction technique. COMPARISON:  Head CT 05/06/2012. FINDINGS: Mild motion artifact. Streak artifact also along the anterior frontal lobes. And some of the anterior orbit and scalp soft tissues, as well as a portion of the left frontal bone, are excluded Brain: Cerebral volume loss since 2013, appears generalized.  No midline shift. No intracranial mass effect is evident. Basilar cisterns remain patent. No acute intracranial hemorrhage identified. No cortically based acute infarct identified. Vascular: Extensive Calcified atherosclerosis at the skull base. No suspicious intracranial vascular hyperdensity. Skull: Chronic left superior vertex craniotomy was present in 2013. Some of the left frontal bone is excluded. No new osseous abnormality identified. Sinuses/Orbits: Improved paranasal sinus aeration compared to 2013. no significant sinus or middle ear opacification now. Other: Some of the anterior orbit and scalp soft tissues, as well as a portion of the left frontal bone, are excluded. No acute soft tissue finding. IMPRESSION: 1. Limited.  No acute intracranial abnormality identified. 2. Chronic left side craniotomy. Electronically Signed   By: Odessa Fleming M.D.   On: 23-Apr-2023 05:40   DG Chest Portable 1 View  Result Date: 2023-04-23 CLINICAL DATA:  Endotracheal tube placement, status post superior. EXAM: PORTABLE CHEST 1 VIEW COMPARISON:  03/28/2023. FINDINGS: The heart size and mediastinal contours are stable. There is atherosclerotic calcification of the aorta. The aortic knob is distended measuring 4.2 cm. Lung volumes are low with strandy opacities at the lung bases. No effusion or pneumothorax is seen. No acute osseous abnormality. The side port of the enteric tube is in the proximal esophagus and should be advanced a proximally 17 cm. The endotracheal tube terminates 4.1 cm above the carina. IMPRESSION: 1. Low lung volumes with atelectasis at the lung bases. 2. Aortic atherosclerosis with dilatation of the aortic knob measuring 4.2 cm. CT is recommended for further evaluation. 3. Side port of the enteric tube is in the proximal esophagus and should be advanced a proximally 17 cm. 4. Enteric tube terminates 4.1 cm above the carina. Electronically Signed   By: Thornell Sartorius M.D.   On: April 23, 2023 01:23   DG Chest  Portable 1 View  Result Date: 04/20/2023 CLINICAL DATA:  Shortness of breath EXAM: PORTABLE CHEST 1 VIEW COMPARISON:  08/24/2021, 05/25/2014 FINDINGS: Hypoventilatory changes with atelectasis or scarring at the bases. Moderate to large hiatal hernia. Aortic atherosclerosis. No pneumothorax. Suspected emphysema with possible bola at the right apex IMPRESSION: 1.  Hypoventilatory changes with atelectasis or scarring at the bases. 2. Moderate to large hiatal hernia. 3. Possible bulla at the right apex Electronically Signed   By: Jasmine Pang M.D.   On: Apr 21, 2023 21:32    Microbiology Recent Results (from the past 240 hour(s))  Culture, Respiratory w Gram Stain     Status: None (Preliminary result)   Collection Time: 03/31/2023  9:21 AM   Specimen: Bronchoalveolar Lavage; Respiratory  Result Value Ref Range Status   Specimen Description BRONCHIAL ALVEOLAR LAVAGE  Final   Special Requests NONE  Final   Gram Stain   Final    ABUNDANT WBC PRESENT, PREDOMINANTLY PMN ABUNDANT GRAM POSITIVE COCCI IN CHAINS RARE GRAM NEGATIVE RODS Performed at [facility], [address], [place], [place] [address]    Culture PENDING  Incomplete   Report Status PENDING  Incomplete  MRSA Next Gen by PCR, Nasal     Status: None   Collection Time: 04/16/2023 11:40 AM   Specimen: Nasal Mucosa; Nasal Swab  Result Value Ref Range Status   MRSA by PCR Next Gen NOT DETECTED NOT DETECTED Final    Comment: (NOTE) The GeneXpert MRSA Assay (FDA approved for NASAL specimens only), is one component of a comprehensive MRSA colonization surveillance program. It is not intended to diagnose MRSA infection nor to guide or monitor treatment for MRSA infections. Test performance is not FDA approved in patients less than 64 years old. Performed at [facility], [address], [place], [place] [address]     Lab Basic Metabolic Panel: Recent Labs  Lab 2023/04/21 2112 04/21/23 2132 04/19/2023 0215 03/29/2023 0232  03/30/2023 0300 04/11/2023 0546 03/30/2023 0748 03/28/2023 0808 04/02/2023 1025 04/21/2023 1113  NA 143 144 144  --    < > 145 144 143 146* 146*  K 3.2* 3.0* 2.7*  --    < > 2.8* 2.9* 3.1* 2.9* 2.8*  CL 97* 101 102  --   --   --   --   --   --  105  CO2 29  --  24  --   --   --   --   --   --  26  GLUCOSE 198* 201* 231*  --   --   --   --   --   --  146*  BUN 41* 43* 40*  --   --   --   --   --   --  41*  CREATININE 1.66* 1.70* 1.96*  --   --   --   --   --   --  2.15*  CALCIUM 9.3  --  8.2*  --   --   --   --   --   --  8.6*  MG 2.2  --   --  1.7  --   --   --   --   --   --   PHOS  --   --   --  5.7*  --   --   --   --   --   --    < > = values in this interval not displayed.   Liver Function Tests: Recent Labs  Lab 04/21/2023 2112  AST 35  ALT 18  ALKPHOS 57  BILITOT 1.2  PROT 7.6  ALBUMIN 4.5   Recent Labs  Lab 04/21/2023 2112  LIPASE 500*   No results for input(s): "AMMONIA" in the last 168 hours. CBC: Recent  Labs  Lab 05/03/2023 2112 05/03/23 2132 04/10/2023 0215 04/09/2023 0300 04/19/2023 0546 03/25/2023 0748 04/15/2023 0808 03/28/2023 1025 04/10/2023 1113  WBC 13.3*  --  7.4  --   --   --   --   --   --   NEUTROABS 11.4*  --  6.7  --   --   --   --   --   --   HGB 15.6   < > 16.0   < > 18.4* 18.4* 15.6 15.6 16.2  HCT 45.1   < > 47.4   < > 54.0* 54.0* 46.0 46.0 48.5  MCV 95.3  --  98.5  --   --   --   --   --   --   PLT 232  --  181  --   --   --   --   --   --    < > = values in this interval not displayed.   Cardiac Enzymes: No results for input(s): "CKTOTAL", "CKMB", "CKMBINDEX", "TROPONINI" in the last 168 hours. Sepsis Labs: Recent Labs  Lab 05/03/2023 2112 05-03-2023 2130 03/31/2023 0215 04/20/2023 0232  WBC 13.3*  --  7.4  --   LATICACIDVEN  --  4.4*  --  4.2*    Procedures/Operations     Inetha Maret 03/29/2023, 4:33 PM

## 2023-04-25 NOTE — Progress Notes (Signed)
CCM update  S: Sudden  drops in BP and and Pulse ox. Improved after bic amp.   O Bedside US:  - sliding lung sign + bilaterally - M Mode: [place] +  - No Ptx  - CT with bilateral R > L consolidation  Recent Labs  Lab 04-May-2023 2112 04-May-2023 2132 04/15/2023 0215 04/03/2023 0300 04/13/2023 0546  HGB 15.6   < > 16.0 15.0 18.4*  HCT 45.1   < > 47.4 44.0 54.0*  WBC 13.3*  --  7.4  --   --   PLT 232  --  181  --   --    < > = values in this interval not displayed.   Recent Labs  Lab 05/04/23 2112 05-04-2023 2132 04/14/2023 0215 04/02/2023 0232 04/09/2023 0300 03/25/2023 0546  NA 143 144 144  --  147* 145  K 3.2* 3.0* 2.7*  --  2.5* 2.8*  CL 97* 101 102  --   --   --   CO2 29  --  24  --   --   --   GLUCOSE 198* 201* 231*  --   --   --   BUN 41* 43* 40*  --   --   --   CREATININE 1.66* 1.70* 1.96*  --   --   --   CALCIUM 9.3  --  8.2*  --   --   --   MG 2.2  --   --  1.7  --   --   PHOS  --   --   --  5.7*  --   --    Recent Labs  Lab 05-04-2023 2112 04/24/2023 0215  AST 35  --   ALT 18  --   ALKPHOS 57  --   BILITOT 1.2  --   PROT 7.6  --   ALBUMIN 4.5  --   INR  --  1.1    Recent Labs  Lab 2023-05-04 2130 04/14/2023 0232  LATICACIDVEN 4.4* 4.2*      A/P - MODS  Plan  - updated son and wife at [facility] -> unable to engage in code status conversationj ->asking about ICU bed  (still not assignment) - await formal CXR/ CT report S- continue supportive care   Additionla 30 min ccm time   SIGNATURE    Dr. Kalman Shan, M.D., F.C.C.P,  Pulmonary and Critical Care Medicine Staff Physician, [facility] Director - Interstitial Lung Disease  Program  Pulmonary Fibrosis [facility] at [facility] [place], [place], [address]   Pager: [phone], If no answer  -> Check AMION or Try [phone] Telephone (clinical office): [phone] Telephone (research): [phone]  6:54 AM 04/12/2023

## 2023-04-25 NOTE — Procedures (Signed)
Arterial Line Insertion Start/End10/14/2024 8:07 AM  Patient location: ED. Preanesthetic checklist: patient identified, IV checked, site marked, risks and benefits discussed, surgical consent, monitors and equipment checked, pre-op evaluation and timeout performed radial was placed Catheter size: 20 G Hand hygiene performed , maximum sterile barriers used  and Seldinger technique used Allen's test indicative of satisfactory collateral circulation Attempts: 1 Procedure performed without using ultrasound guided technique. Following insertion, dressing applied and Biopatch. Post procedure assessment: unchanged  Post procedure complications: second provider assisted. Patient tolerated the procedure well with no immediate complications.

## 2023-04-25 NOTE — ED Notes (Signed)
CCM called to reassess pt.

## 2023-04-25 DEATH — deceased
